# Patient Record
Sex: Female | Born: 1975 | Race: White | Hispanic: No | Marital: Married | Smoking: Former smoker
Health system: Southern US, Community
[De-identification: ages and names within clinical notes are randomized; demographics above are authoritative.]

## PROBLEM LIST (undated history)

## (undated) DIAGNOSIS — M797 Fibromyalgia: Secondary | ICD-10-CM

## (undated) DIAGNOSIS — R7303 Prediabetes: Secondary | ICD-10-CM

## (undated) DIAGNOSIS — E7800 Pure hypercholesterolemia, unspecified: Secondary | ICD-10-CM

## (undated) DIAGNOSIS — G8929 Other chronic pain: Secondary | ICD-10-CM

## (undated) DIAGNOSIS — R5382 Chronic fatigue, unspecified: Secondary | ICD-10-CM

## (undated) DIAGNOSIS — M419 Scoliosis, unspecified: Secondary | ICD-10-CM

## (undated) DIAGNOSIS — J45909 Unspecified asthma, uncomplicated: Secondary | ICD-10-CM

## (undated) DIAGNOSIS — G4700 Insomnia, unspecified: Secondary | ICD-10-CM

## (undated) DIAGNOSIS — G4730 Sleep apnea, unspecified: Secondary | ICD-10-CM

## (undated) HISTORY — PX: CHOLECYSTECTOMY: SHX55

## (undated) HISTORY — DX: Pure hypercholesterolemia, unspecified: E78.00

## (undated) HISTORY — PX: THYROIDECTOMY: SHX17

## (undated) HISTORY — DX: Insomnia, unspecified: G47.00

## (undated) HISTORY — DX: Prediabetes: R73.03

## (undated) HISTORY — PX: TONSILLECTOMY: SUR1361

## (undated) HISTORY — DX: Unspecified asthma, uncomplicated: J45.909

## (undated) HISTORY — DX: Sleep apnea, unspecified: G47.30

## (undated) HISTORY — PX: TUBAL LIGATION: SHX77

## (undated) HISTORY — DX: Fibromyalgia: M79.7

## (undated) HISTORY — PX: ABDOMINAL HYSTERECTOMY: SHX81

## (undated) HISTORY — PX: SPINAL FUSION: SHX223

## (undated) HISTORY — DX: Chronic fatigue, unspecified: R53.82

## (undated) HISTORY — DX: Scoliosis, unspecified: M41.9

## (undated) HISTORY — DX: Other chronic pain: G89.29

---

## 1998-05-09 ENCOUNTER — Observation Stay (HOSPITAL_COMMUNITY): Admission: AD | Admit: 1998-05-09 | Discharge: 1998-05-09 | Payer: Self-pay | Admitting: Obstetrics & Gynecology

## 1998-07-03 ENCOUNTER — Observation Stay (HOSPITAL_COMMUNITY): Admission: AD | Admit: 1998-07-03 | Discharge: 1998-07-04 | Payer: Self-pay | Admitting: Obstetrics and Gynecology

## 1998-08-27 ENCOUNTER — Inpatient Hospital Stay (HOSPITAL_COMMUNITY): Admission: AD | Admit: 1998-08-27 | Discharge: 1998-08-27 | Payer: Self-pay | Admitting: Obstetrics & Gynecology

## 1998-09-02 ENCOUNTER — Inpatient Hospital Stay (HOSPITAL_COMMUNITY): Admission: AD | Admit: 1998-09-02 | Discharge: 1998-09-02 | Payer: Self-pay | Admitting: Obstetrics and Gynecology

## 1998-09-02 ENCOUNTER — Encounter: Payer: Self-pay | Admitting: Obstetrics and Gynecology

## 1998-09-07 ENCOUNTER — Observation Stay (HOSPITAL_COMMUNITY): Admission: AD | Admit: 1998-09-07 | Discharge: 1998-09-08 | Payer: Self-pay | Admitting: Obstetrics and Gynecology

## 1998-09-09 ENCOUNTER — Inpatient Hospital Stay (HOSPITAL_COMMUNITY): Admission: AD | Admit: 1998-09-09 | Discharge: 1998-09-09 | Payer: Self-pay | Admitting: Obstetrics & Gynecology

## 1998-10-09 ENCOUNTER — Inpatient Hospital Stay (HOSPITAL_COMMUNITY): Admission: AD | Admit: 1998-10-09 | Discharge: 1998-10-09 | Payer: Self-pay | Admitting: Obstetrics and Gynecology

## 1998-10-10 ENCOUNTER — Inpatient Hospital Stay (HOSPITAL_COMMUNITY): Admission: AD | Admit: 1998-10-10 | Discharge: 1998-10-10 | Payer: Self-pay | Admitting: *Deleted

## 1998-10-27 ENCOUNTER — Inpatient Hospital Stay (HOSPITAL_COMMUNITY): Admission: AD | Admit: 1998-10-27 | Discharge: 1998-10-27 | Payer: Self-pay | Admitting: Obstetrics and Gynecology

## 1998-11-06 ENCOUNTER — Inpatient Hospital Stay (HOSPITAL_COMMUNITY): Admission: AD | Admit: 1998-11-06 | Discharge: 1998-11-08 | Payer: Self-pay | Admitting: Obstetrics and Gynecology

## 1998-12-05 ENCOUNTER — Other Ambulatory Visit: Admission: RE | Admit: 1998-12-05 | Discharge: 1998-12-05 | Payer: Self-pay | Admitting: Obstetrics and Gynecology

## 1999-12-05 ENCOUNTER — Other Ambulatory Visit: Admission: RE | Admit: 1999-12-05 | Discharge: 1999-12-05 | Payer: Self-pay | Admitting: Obstetrics and Gynecology

## 2000-03-09 ENCOUNTER — Emergency Department (HOSPITAL_COMMUNITY): Admission: EM | Admit: 2000-03-09 | Discharge: 2000-03-09 | Payer: Self-pay | Admitting: Emergency Medicine

## 2000-03-09 ENCOUNTER — Encounter: Payer: Self-pay | Admitting: Emergency Medicine

## 2000-03-26 ENCOUNTER — Inpatient Hospital Stay (HOSPITAL_COMMUNITY): Admission: AD | Admit: 2000-03-26 | Discharge: 2000-03-26 | Payer: Self-pay | Admitting: Obstetrics and Gynecology

## 2000-03-30 ENCOUNTER — Ambulatory Visit (HOSPITAL_COMMUNITY): Admission: RE | Admit: 2000-03-30 | Discharge: 2000-03-30 | Payer: Self-pay | Admitting: Obstetrics & Gynecology

## 2000-03-30 ENCOUNTER — Encounter: Payer: Self-pay | Admitting: Obstetrics & Gynecology

## 2000-04-06 ENCOUNTER — Inpatient Hospital Stay (HOSPITAL_COMMUNITY): Admission: AD | Admit: 2000-04-06 | Discharge: 2000-04-06 | Payer: Self-pay | Admitting: Obstetrics and Gynecology

## 2000-04-08 ENCOUNTER — Inpatient Hospital Stay (HOSPITAL_COMMUNITY): Admission: AD | Admit: 2000-04-08 | Discharge: 2000-04-08 | Payer: Self-pay | Admitting: Obstetrics and Gynecology

## 2000-04-11 ENCOUNTER — Inpatient Hospital Stay (HOSPITAL_COMMUNITY): Admission: AD | Admit: 2000-04-11 | Discharge: 2000-04-11 | Payer: Self-pay | Admitting: Obstetrics and Gynecology

## 2000-04-29 ENCOUNTER — Inpatient Hospital Stay (HOSPITAL_COMMUNITY): Admission: AD | Admit: 2000-04-29 | Discharge: 2000-05-01 | Payer: Self-pay | Admitting: Obstetrics & Gynecology

## 2000-04-30 ENCOUNTER — Encounter: Payer: Self-pay | Admitting: Obstetrics and Gynecology

## 2000-05-24 ENCOUNTER — Inpatient Hospital Stay (HOSPITAL_COMMUNITY): Admission: AD | Admit: 2000-05-24 | Discharge: 2000-05-24 | Payer: Self-pay | Admitting: Obstetrics and Gynecology

## 2000-06-02 ENCOUNTER — Inpatient Hospital Stay (HOSPITAL_COMMUNITY): Admission: AD | Admit: 2000-06-02 | Discharge: 2000-06-02 | Payer: Self-pay | Admitting: Obstetrics and Gynecology

## 2000-07-02 ENCOUNTER — Inpatient Hospital Stay (HOSPITAL_COMMUNITY): Admission: AD | Admit: 2000-07-02 | Discharge: 2000-07-04 | Payer: Self-pay | Admitting: Obstetrics & Gynecology

## 2000-07-14 ENCOUNTER — Inpatient Hospital Stay (HOSPITAL_COMMUNITY): Admission: AD | Admit: 2000-07-14 | Discharge: 2000-07-17 | Payer: Self-pay | Admitting: Obstetrics & Gynecology

## 2000-07-15 ENCOUNTER — Encounter: Payer: Self-pay | Admitting: Obstetrics and Gynecology

## 2000-07-21 ENCOUNTER — Inpatient Hospital Stay (HOSPITAL_COMMUNITY): Admission: AD | Admit: 2000-07-21 | Discharge: 2000-07-21 | Payer: Self-pay | Admitting: Obstetrics & Gynecology

## 2000-07-27 ENCOUNTER — Encounter (INDEPENDENT_AMBULATORY_CARE_PROVIDER_SITE_OTHER): Payer: Self-pay

## 2000-07-27 ENCOUNTER — Inpatient Hospital Stay (HOSPITAL_COMMUNITY): Admission: AD | Admit: 2000-07-27 | Discharge: 2000-07-30 | Payer: Self-pay | Admitting: Obstetrics and Gynecology

## 2000-08-24 ENCOUNTER — Other Ambulatory Visit: Admission: RE | Admit: 2000-08-24 | Discharge: 2000-08-24 | Payer: Self-pay | Admitting: Obstetrics and Gynecology

## 2000-09-24 ENCOUNTER — Observation Stay (HOSPITAL_COMMUNITY): Admission: RE | Admit: 2000-09-24 | Discharge: 2000-09-25 | Payer: Self-pay

## 2001-09-15 ENCOUNTER — Other Ambulatory Visit: Admission: RE | Admit: 2001-09-15 | Discharge: 2001-09-15 | Payer: Self-pay | Admitting: *Deleted

## 2001-09-15 ENCOUNTER — Other Ambulatory Visit: Admission: RE | Admit: 2001-09-15 | Discharge: 2001-09-15 | Payer: Self-pay | Admitting: Obstetrics and Gynecology

## 2002-11-02 ENCOUNTER — Other Ambulatory Visit: Admission: RE | Admit: 2002-11-02 | Discharge: 2002-11-02 | Payer: Self-pay | Admitting: Obstetrics and Gynecology

## 2003-12-19 ENCOUNTER — Other Ambulatory Visit: Admission: RE | Admit: 2003-12-19 | Discharge: 2003-12-19 | Payer: Self-pay | Admitting: Obstetrics and Gynecology

## 2004-01-17 ENCOUNTER — Encounter: Admission: RE | Admit: 2004-01-17 | Discharge: 2004-01-17 | Payer: Self-pay | Admitting: Family Medicine

## 2004-12-17 ENCOUNTER — Ambulatory Visit (HOSPITAL_COMMUNITY): Admission: RE | Admit: 2004-12-17 | Discharge: 2004-12-17 | Payer: Self-pay | Admitting: Endocrinology

## 2005-01-07 ENCOUNTER — Encounter (INDEPENDENT_AMBULATORY_CARE_PROVIDER_SITE_OTHER): Payer: Self-pay | Admitting: Specialist

## 2005-01-07 ENCOUNTER — Other Ambulatory Visit: Admission: RE | Admit: 2005-01-07 | Discharge: 2005-01-07 | Payer: Self-pay | Admitting: Interventional Radiology

## 2005-01-07 ENCOUNTER — Encounter: Admission: RE | Admit: 2005-01-07 | Discharge: 2005-01-07 | Payer: Self-pay | Admitting: Endocrinology

## 2005-04-01 ENCOUNTER — Other Ambulatory Visit: Admission: RE | Admit: 2005-04-01 | Discharge: 2005-04-01 | Payer: Self-pay | Admitting: Obstetrics and Gynecology

## 2005-10-26 ENCOUNTER — Encounter: Admission: RE | Admit: 2005-10-26 | Discharge: 2005-10-26 | Payer: Self-pay | Admitting: Family Medicine

## 2005-11-25 ENCOUNTER — Encounter: Admission: RE | Admit: 2005-11-25 | Discharge: 2005-11-25 | Payer: Self-pay | Admitting: Orthopaedic Surgery

## 2005-12-10 ENCOUNTER — Encounter: Admission: RE | Admit: 2005-12-10 | Discharge: 2005-12-10 | Payer: Self-pay | Admitting: Orthopaedic Surgery

## 2005-12-25 ENCOUNTER — Encounter: Admission: RE | Admit: 2005-12-25 | Discharge: 2005-12-25 | Payer: Self-pay | Admitting: Orthopaedic Surgery

## 2006-01-08 ENCOUNTER — Encounter: Admission: RE | Admit: 2006-01-08 | Discharge: 2006-01-08 | Payer: Self-pay | Admitting: Orthopaedic Surgery

## 2006-02-15 ENCOUNTER — Ambulatory Visit (HOSPITAL_COMMUNITY): Admission: RE | Admit: 2006-02-15 | Discharge: 2006-02-15 | Payer: Self-pay | Admitting: Endocrinology

## 2006-05-17 ENCOUNTER — Encounter: Admission: RE | Admit: 2006-05-17 | Discharge: 2006-05-17 | Payer: Self-pay | Admitting: Endocrinology

## 2006-06-28 ENCOUNTER — Ambulatory Visit (HOSPITAL_COMMUNITY): Admission: RE | Admit: 2006-06-28 | Discharge: 2006-06-29 | Payer: Self-pay

## 2006-06-28 ENCOUNTER — Encounter (INDEPENDENT_AMBULATORY_CARE_PROVIDER_SITE_OTHER): Payer: Self-pay | Admitting: Specialist

## 2006-08-24 ENCOUNTER — Encounter
Admission: RE | Admit: 2006-08-24 | Discharge: 2006-11-22 | Payer: Self-pay | Admitting: Physical Medicine and Rehabilitation

## 2007-07-07 ENCOUNTER — Encounter (INDEPENDENT_AMBULATORY_CARE_PROVIDER_SITE_OTHER): Payer: Self-pay | Admitting: Obstetrics and Gynecology

## 2007-07-07 ENCOUNTER — Ambulatory Visit (HOSPITAL_COMMUNITY): Admission: RE | Admit: 2007-07-07 | Discharge: 2007-07-08 | Payer: Self-pay | Admitting: Obstetrics and Gynecology

## 2008-02-13 ENCOUNTER — Encounter: Payer: Self-pay | Admitting: Rheumatology

## 2008-03-07 ENCOUNTER — Encounter: Payer: Self-pay | Admitting: Rheumatology

## 2008-05-16 ENCOUNTER — Encounter: Admission: RE | Admit: 2008-05-16 | Discharge: 2008-05-16 | Payer: Self-pay | Admitting: Family Medicine

## 2010-09-28 ENCOUNTER — Encounter: Payer: Self-pay | Admitting: Endocrinology

## 2011-01-20 NOTE — H&P (Signed)
NAMENIMSI, MALES                  ACCOUNT NO.:  [id number]   MEDICAL RECORD NO.:  [id number]          PATIENT TYPE:  AMB   LOCATION:  SDC                           FACILITY:  [facility]   PHYSICIAN:  Michelle L. Grewal, M.D.DATE OF BIRTH:  [birth year]   DATE OF ADMISSION:  07/07/2007  DATE OF DISCHARGE:                              HISTORY & PHYSICAL   HISTORY OF PRESENT ILLNESS:  A 35 year old G4, P4 who presents today  with a history of PCOS and chronic pelvic pain as well as recurrent  ovarian cysts.  She is a G4, P4 and ever since the delivery of her last  child, she has had a problem with weight gain.  She had her thyroid  removed in October 2007, and has been on Levoxyl since.  The patient has  also had recurrent office visits for lower abdominal pain and has been  diagnosed with ovarian cyst from time to time.  We have tried her on  multiple birth control pills that she has not tolerated and her pain has  not improved.  The pain is usually in the bilateral lower quadrants and  sometimes is constant and dull and sometimes lasts for weeks at a time.  Her last visit in the office was on May 26, 2007, for this pain.  At that time, ultrasound showed a 1.8-cm simple appearing cyst.  She has  no GU problems and no GI problems.  She had been on Loestrin as well as  previous other birth control pills as well as metformin.  She presents  today for definitive treatment for chronic pelvic pain with LAVH and  BSO.  The patient had tubal ligation in the past.   MEDICATIONS:  Lyrica, Loestrin, phentermine, Norco, Ambien, Advair,  Levoxyl and previously was on Cymbalta.   ALLERGIES:  ERYTHROMYCIN AND ZYRTEC D.   PAST SURGICAL HISTORY:  1. Lumbar fusion on Jan 14, 2006.  2. Fibroid removal on October 2007.  3. Tubal ligation.   PAST GYNECOLOGICAL HISTORY:  The patient is a G4, P4.   REVIEW OF SYSTEMS:  As above.   PHYSICAL EXAMINATION:  VITAL SIGNS:  The patient is afebrile with  stable  vital signs.  Height is 5 feet 1 inch, weight is 222.4 pounds.  GENERAL:  She is alert and oriented in no apparent distress.  LUNGS:  Clear to auscultation bilaterally.  CARDIAC:  Regular rate and rhythm.  BREASTS:  Within normal limits.  ABDOMEN:  Slightly obese and nontender.  She does have some tenderness  except for some tenderness in the bilateral lower quadrant.  PELVIC:  External genitalia within normal limits.  Vagina appears  normal.  Cervix with no lesions.  She has good descensus.  The uterus is  normal size, nontender.  Bilateral adnexal tenderness with no fullness  noted.   IMPRESSION:  1. Chronic pelvic pain.  2. Polycystic ovary syndrome.  3. History of ovarian cyst.   PLAN:  Will proceed with LAVH and BSO.  The risks of surgery were  discussed with the patient prior to surgery which include the  risk of  anesthesia, risk of infection, risk of pulmonary embolism and deep  venous thromboembolism, risk of injury to ureter, bowel or bladder and  risk of bleeding and risk associated with transfusion.  The patient  agrees to proceed with surgery.      Michelle L. Vincente Poli, M.D.  Electronically Signed     MLG/MEDQ  D:  07/06/2007  T:  07/07/2007  Job:  [id number]

## 2011-01-20 NOTE — Op Note (Signed)
[name], [name]                  ACCOUNT NO.:  [id number]   MEDICAL RECORD NO.:  [id number]          PATIENT TYPE:  AMB   LOCATION:  SDC                           FACILITY:  [facility]   PHYSICIAN:  Michelle L. Grewal, M.D.DATE OF BIRTH:  April 06, 1976   DATE OF PROCEDURE:  DATE OF DISCHARGE:  07/07/2007                               OPERATIVE REPORT   PREOPERATIVE DIAGNOSES:  1. Pelvic pain.  2. Ovarian cyst.  3. Polycystic ovary syndrome.   POSTOPERATIVE DIAGNOSES:  1. Pelvic pain.  2. Ovarian cyst.  3. Polycystic ovary syndrome.   PROCEDURE:  Laparoscopically-assisted vaginal hysterectomy and bilateral  salpingo-oophorectomy.   SURGEON:  Michelle L. Vincente Poli, MD   ASSISTANT:  Zelphia Cairo, MD   ANESTHESIA:  General.   SPECIMENS:  Uterus, tubes and ovaries sent to routine pathology.   ESTIMATED BLOOD LOSS:  200 mL.   DRAINS:  Foley.   PROCEDURE:  The patient was taken to the operating room after informed  consent was obtained.  She was then prepped and draped in the usual  sterile fashion after she was intubated without difficulty.  Antibiotics  were given prophylactically.  She also had pneumatic stockings on.  A  Foley catheter was inserted after she was draped.  Attention was turned  to the abdomen, where an infraumbilical incision was made with a  scalpel.  The Veress needle was inserted and pneumoperitoneum was  performed without difficulty.  The Veress needle was removed and an 11-  mm trocar was inserted.  The patient was placed in Trendelenburg  position and a 5-mm trocar was placed suprapubically under direct  visualization.  Exam of the abdomen and pelvis revealed that she was  status post C-section and status post tubal ligation.  The uterus was  normal size.  There were no adhesions and no evidence of endometriosis.  Ovaries appeared consistent with polycystic ovaries.  We then identified  the ureters, which were well below our IP ligaments, and there were  easily seen bilaterally.  We then started on the right side by lifting  up the ovary and placing in the Gyrus instrument across the IP ligament  just beneath the ovary, cauterizing and transecting and carrying it all  the way to the round ligament.  This was done on the right and then on  the left side in a similar fashion.  There was really no bleeding with  this portion of the procedure.  After we did that, we then converted  vaginally by removing the laparoscope, leaving the trocars and releasing  the pneumoperitoneum.  A weighted speculum was placed in the vagina.  A  circumferential incision was made around the cervix.  The posterior cul-  de-sac was entered sharply and the anterior cul-de-sac was entered  sharply as well.  We then used curved Heaney clamps and clamped just  beside the cervix, the uterosacral-cardinal ligaments.  Each pedicle was  cut and suture ligated using 0 Vicryl suture.  We worked our way all the  way up the broad ligament.  Each pedicle was suture ligated using 0  Vicryl suture.  The uterus was then removed along with the tubes and  ovaries.  It was sent to pathology.  The posterior cuff was closed from  3 to 9 o'clock in a running stitch using 0 Vicryl suture and then the  cuff was closed anterior to posterior using 0 Vicryl suture as well.  We  then went back up to the abdomen, replaced the pneumoperitoneum, placed  the patient in Trendelenburg position, irrigated the pelvis.  There was  no bleeding at all.  Hemostasis was excellent.  We then removed the  pneumoperitoneum, removed the trocars and closed each incision with a  figure-of-eight using 0 Vicryl suture and 3-0 Vicryl suture and closed  each incision with Dermabond skin adhesive.  All sponge, lap and  instrument counts were correct x2.  The patient went to recovery room in  stable condition.      Michelle L. Vincente Poli, M.D.  Electronically Signed     MLG/MEDQ  D:  07/07/2007  T:  07/07/2007   Job:  [id number]

## 2011-01-23 NOTE — Op Note (Signed)
[facility]  PatientCHANAY, NUGENT                           MRN: [id number] Proc. Date: 07/27/00 Adm. Date:  [id number] Attending:  Marcelle Overlie                           Operative Report  PREOPERATIVE DIAGNOSES:       1. Intrauterine pregnancy at 35-2/7 weeks.                               2. Twin pregnancy with twin B breech                                  presentation.                               3. Labor.                               4. Desires permanent sterilization.  POSTOPERATIVE DIAGNOSIS:      1. Intrauterine pregnancy at 35-2/7 weeks.                               2. Twin pregnancy with twin B breech                                  presentation.                               3. Labor.                               4. Desires permanent sterilization.  PROCEDURE:                    1. Primary low transverse cesarean section.                               2. Bilateral tubal ligation.  SURGEON:                      Marcelle Overlie, M.D.  ASSISTANT:                    Jamey Reas, M.D.  ANESTHESIA:                   General.  FINDINGS:                     Twin A 5 pounds 12 ounces, Apgars 8 at one minute and 9 at five minutes.  Twin B, weighing 6 pounds and Apgars 8 and 9. Both are female.  Normal tubes.  PATHOLOGY:                    Bilateral fallopian tube segments.  DESCRIPTION OF PROCEDURE:  The patient was taken to the operating room.  A spinal was attempted but failed secondary to the patients prior history of back surgery, and the patient was then intubated.  Prior to intubation, the abdomen was prepped and draped, and a Foley was already placed.  After intubation, the scalpel was used to make a low transverse incision which was carried down to the fascia.  The fascia was scored in the midline and extended laterally. The fascia was then dissected, and a Pfannenstiel incision was then created, and the  peritoneum was entered in the midline.  The peritoneal incision was stretched, and the lateral uterine segment was identified.  The bladder flap was then created sharply and then digitally, and the bladder blade was then readjusted.  A low transverse incision was made in the uterus and upon entering the amniotic cavity, the fluid was noted to be clear.  Baby A was delivered in cephalic presentation and was a female weighing 5 pounds 12 ounces with Apgars 8 at one minute and 9 at five minutes.  The cord was clamped and cut, and the baby was handed to the waiting pediatricians.  Baby B was noted to be in transverse position but after delivery of A, the head floated to the incision nicely, and an amniotomy was performed with clear fluid in the back of baby B, and the baby was delivered quite easily and was a female weighing 6 pounds with Apgars 8 at one minute and 9 at five minutes. The cord was clamped and cut, and the baby was handed to the waiting pediatricians.  The placentas were manually removed and noted to be intact. The uterus was then cleared of all clots and debris.  The uterine incision was closed in a single layer using 0 chromic and continuous running lock stitch, and then we proceeded with the tubal, identifying the tube initially on the left side and following it to its fimbriated end, grasping the mid portion with a Babcock clamp and tying off with a 3 cm knuckle using plain gut suture x 2 and then excising that knuckle.  We then performed a modified Pomeroy tubal ligation on the right side in a similar fashion after the fimbriated end was easily visualized.  The ovaries were noted to be normal in appearance. The uterus was returned to the abdomen.  The peritoneum was closed in a single layer using 0 Vicryl in continuous running stitch and the rectus muscles were reapproximated using the same 0 Vicryl.  The fascia was closed using 0 Vicryl in continuous running stitch starting  at each corner and meeting in the midline.  After irrigation of the subcutaneous layer, the skin was closed with staples.  All sponge, lap, and instrument counts were correct x 2.  The patient tolerated the procedure well and went to the recovery room in stable condition. DD:  07/27/00 TD:  07/28/00 Job: [id number]

## 2011-01-23 NOTE — Discharge Summary (Signed)
[facility]  PatientKASHISH, [name]                           MRN: [id number] Adm. Date:  [id number] Disc. Date: 07/04/00 Attending:  Marcelle Overlie Dictator:   Leilani Able, P.A.                           Discharge Summary  FINAL DIAGNOSES:              1. Twin gestation at 31-5/[redacted] weeks gestation.                               2. Preterm labor.  HISTORY OF PRESENT ILLNESS:   This 35 year old G4, P2-0-1-2, presents at 31-5/[redacted] weeks gestation with contractions.  HOSPITAL COURSE:              The patient was admitted at this time.  Started on magnesium sulfate.  Group B strep cultures were obtained.  She was also started on Unasyn 3 g IV every six hours.  Was also started on betamethasone protocol.  The patients contractions subsided with magnesium sulfate.  On July 04, 2000, the patients magnesium sulfate was stopped.  She was started on oral terbutaline.  She was stable on her oral terbutaline on July 04, 2000, and felt ready for discharge.  She was sent home on a regular diet, was told to continue bed rest.  Was given terbutaline 2.5 mg 1 every three to four hours, and then to call the office if contractions resumed. DD:  07/28/00 TD:  07/29/00 Job: [id number]

## 2011-01-23 NOTE — Discharge Summary (Signed)
[facility]  PatientSRISHTI, STRNAD                           MRN: [id number] Adm. Date:  [id number] Disc. Date: [id number] Attending:  Marcelle Overlie Dictator:   Leilani Able, P.A.                           Discharge Summary  FINAL DIAGNOSES:              1. Intrauterine pregnancy at 35-2/[redacted] weeks                                  gestation, twin pregnancy with twin B at                                  breech presentation, in labor.                               2. Desires permanent sterilization.  PROCEDURES:                   1. Low transverse cesarean section.                               2. Bilateral tubal ligation.  SURGEON:                      Marcelle Overlie, M.D.  ASSISTANT:                    Jamey Reas, M.D.  HISTORY OF PRESENT ILLNESS:   This 35 year old, G4, P2-0-1-2, presents at [redacted] weeks gestation, in active labor.  The patients pregnancy has been complicated by twin pregnancy and multiple admissions for preterm labor.  The patient also desires permanent sterilization at this time.  HOSPITAL COURSE:              She was taken to the operating room by Marcelle Overlie, M.D., where a primary low transverse cesarean section was performed with the delivery of twin A weighing 5 pounds 12 ounces with Apgars of 8 and 9, which was a female.  Twin B was also a female, weighing 6 pounds with Apgars of 8 and 9.  The delivery went without complications.  At this point, bilateral tubal ligation was performed.  The delivery went without complication. The patients postoperative course was benign without significant fevers.  DISPOSITION:                  She was felt ready for discharge on postoperative day #3.  DIET:                         She was sent home on a regular diet.  ACTIVITY:                     Told to decrease activities.  DISCHARGE MEDICATIONS:        Told to continue prenatal vitamins.  She was given Tylox, #30,  one to two every four hours as needed for pain.  FOLLOW-UP:  Told to follow up in the office in four weeks.  LABORATORY DATA:              On discharge, the patient had a hemoglobin of 10.1 and a white blood cell count of 14.9. DD:  09/16/00 TD:  09/16/00 Job: [id number]

## 2011-01-23 NOTE — Op Note (Signed)
[facility]  Patient:    [name], [name]                    MRN: [id number] Proc. Date: 09/24/00 Adm. Date:  [id number] Disc. Date: [id number] Attending:  Meredith Leeds                           Operative Report  PREOPERATIVE DIAGNOSIS:  Symptomatic gallstones.  POSTOPERATIVE DIAGNOSIS:  Symptomatic gallstones.  OPERATION PERFORMED:  Laparoscopic cholecystectomy with operative cholangiogram.  SURGEON:  Dr. Orson Slick.  ASSISTANT:  Dr. Samuella Cota.  ANESTHESIA:  General.  DESCRIPTION OF PROCEDURE:  After the patient was adequately monitored and anesthetized and after routine preparation and draping of the abdomen, I made a short transverse infraumbilical incision, dissected down to the fascia, opened it longitudinally and entered the peritoneum bluntly. After placing a #0 Vicryl pursestring suture and securing a Hasson cannula, I inflated the abdomen with CO2 and performed laparoscopy. There was some adhesions of the omentum to the lower midline which I subsequently took down with hot scissors but otherwise no abnormalities were noted. After placement of three additional ports and positioning the patient head up foot down and tilted to the left, I retracted the fundus of the gallbladder upward and retracted the infundibulum of the gallbladder laterally and dissected out the cystic duct and the cystic artery. The cystic artery lay in front of the cystic duct and so I clipped and divided it prior to controlling the cystic duct. After assuring that I was dealing with the cystic duct, I put a clip toward the gallbladder and then made a small hole in it and put in a Reddick cholangiogram catheter and inflated the balloon partially and performed a fluoroscopic cholangiogram showing normal size ducts with no evidence of any filling defects or obstruction. I then removed the cholangiogram catheter and clipped the cystic duct with three clips distal to the  hole and divided it. I dissected the gallbladder from the liver with the spatula and hook cautery devices and got hemostasis with the cautery. Hemostasis was excellent and there was no evidence of leakage of bile. After irrigation and removal of the irrigant, I released CO2, tied the pursestring suture in the umbilical incision after removing the gallbladder through the umbilical incision and then removed the other ports. I closed the skin of all the incisions with intracuticular 4-0 Vicryl and Steri-Strips. All incisions were anesthetized with long acting local anesthetic. The patient tolerated the operation well. DD:  09/24/00 TD:  09/25/00 Job: [id number]

## 2011-01-23 NOTE — Discharge Summary (Signed)
[facility]  PatientMABEL, [name]                           MRN: [id number] Adm. Date:  [id number] Disc. Date: [id number] Attending:  Marcelle Overlie Dictator:   Leilani Able, P.A.                           Discharge Summary  FINAL DIAGNOSES:              1. Twin gestation at 33-3/[redacted] weeks gestation.                               2. Threatened preterm labor.                               3. Upper respiratory tract infection.  HISTORY OF PRESENT ILLNESS:   This 35 year old, G4, P2-0-1-2, presents with on and off contractions since July 13, 2000.  The patient has a history of preterm labor during this pregnancy.  She has been treated with terbutaline, Procardia, and magnesium sulfate.  Her most recent admission was on July 02, 2000.  At that point, the patient had been treated with magnesium sulfate for 48 hours.  She was given two doses of betamethasone and was given prophylactic Unasyn.  She was discharged home on terbutaline.  Now the contractions are starting up again.  Since arriving at the hospital, the patient received subcu terbutaline with decreased contractions for about 15 minutes, but now they are increased again.  The patient is admitted at this time for magnesium sulfate tocolysis and another two doses of betamethasone.  HOSPITAL COURSE:              The patients contractions ceased on the magnesium sulfate.  By hospital day #2, the patient was weaned off of magnesium sulfate and started back on her oral terbutaline.  Ultrasounds were performed on both babies, which showed good growth and normal fluid.  They did show twin B to be in a breech presentation.  By hospital day #3, the patient was having rare contractions and she was stable without any cervical change.  ACTIVITIES:                   She was sent home on bed rest.  DISCHARGE MEDICATIONS:        To continue her vitamins with iron.  To continue her terbutaline 5 mg  one every four hours.  Continue her Keflex as previously prescribed for upper respiratory tract infection.  FOLLOW-UP:                    To follow up on Monday in the office.  SPECIAL INSTRUCTIONS:         The patient was asked to call for any increasing contractions, bleeding, or pain. DD:  09/16/00 TD:  09/16/00 Job: [id number]

## 2011-01-23 NOTE — Op Note (Signed)
Marissa, Mejia                  ACCOUNT NO.:  [id number]   MEDICAL RECORD NO.:  [id number]          PATIENT TYPE:  OIB   LOCATION:  1613                         FACILITY:  [facility]   PHYSICIAN:  Lebron Conners, M.D.   DATE OF BIRTH:  08/22/1976   DATE OF PROCEDURE:  06/28/2006  DATE OF DISCHARGE:                                 OPERATIVE REPORT   PRE AND POSTOPERATIVE DIAGNOSIS:  Bilateral thyroid nodules.   OPERATION:  Total thyroidectomy.   SURGEON:  Lebron Conners, M.D.   ASSISTANT:  Anselm Pancoast. Zachery Dakins, M.D.   ANESTHESIA:  General.   SPECIMEN:  The thyroid gland.   BLOOD LOSS:  Minimal.   COMPLICATIONS:  None.   PROCEDURE:  After the patient was monitored and asleep and had routine  preparation and draping of the neck and had the head extended so that I had  good view of the anterior neck, I prepped and draped the neck as sterile  field.  I made about an 8 cm collar incision through a skin wrinkle line  about 3 cm above sternal notch.  I dissected down through the fat and  platysma and then dissected the planes beneath the platysma cephalad to the  thyroid cartilage and caudad to the sternal notch.  After placement of a  Mahorner self-retaining retractor I incised the midline fascia through the  strap muscles and identified the isthmus of thyroid gland.  Working first on  the right lobe.  I dissected anterior to the gland.  Retracting the strap  muscles anteriorly and cutting a little bit of the strap muscles on the  superior aspect to get good view of the superior pole.  I passed a right-  angle clamp around the superior pole vessels and ligated them with 2-0 silk  ligature proximally and distally and also clipped them proximally with  medium clip and divided them.  I then dissected more caudad on the gland,  noting a large middle vein and clipped and divided it.  I also clipped and  divided the inferior thyroid artery and veins as they entered the thyroid  gland  staying very close to the gland.  I then pulled the gland forward and  used blunt dissection.  I lifted it up out of the tracheoesophageal groove.  I identified the recurrent laryngeal nerve as it rose up into the larynx  through the tracheoesophageal groove.  I made sure that I did not cauterize  it or harm it.  I then brought the gland forward some more leaving a very  small portion of the thyroid gland attached the area of the tubercle of  Zuckerkandl and an area near the recurrent nerve and then dissecting between  the thyroid gland and the trachea past the midline.  I then packed the  dissected area with Surgicel and gauze and turned attention to the left  side.  I followed a similar procedure.  Both sides of the gland were noted  to have multiple nodules.  I identified the recurrent nerve on the left side  similar to way I did  on the right and spared it from harm.  I also saw at  least one parathyroid gland on each side and was sure that I did not harm.  After completely removing the thyroid gland, I placed a suture on the left  superior pole for orientation for the pathologist.  I irrigated the neck and  removed the irrigant and packed Surgicel into each side.  Hemostasis was  excellent.  Sponge, needle and instrument counts were correct.  I closed the  midline fascia and the platysma with running 3-0 Vicryl suture and I closed  the skin with interrupted 4-0 Vicryls buried and reinforced with Steri-  Strips.  The patient tolerated the operation well.      Lebron Conners, M.D.  Electronically Signed     WB/MEDQ  D:  06/28/2006  T:  06/28/2006  Job:  [id number]   cc:   Dorisann Frames, M.D.  Fax: [phone]   Carola J. Gerri Spore, M.D.  Fax: [phone]

## 2011-05-01 ENCOUNTER — Encounter: Payer: Self-pay | Admitting: Pulmonary Disease

## 2011-05-04 ENCOUNTER — Institutional Professional Consult (permissible substitution): Payer: Self-pay | Admitting: Pulmonary Disease

## 2011-06-03 ENCOUNTER — Encounter: Payer: Self-pay | Admitting: Pulmonary Disease

## 2011-06-08 ENCOUNTER — Institutional Professional Consult (permissible substitution): Payer: Self-pay | Admitting: Pulmonary Disease

## 2011-06-17 LAB — CBC
HCT: 33.9 — ABNORMAL LOW
HCT: 42.3
MCHC: 34.8
Platelets: 289
RDW: 12.8
WBC: 8.2

## 2011-06-17 LAB — BASIC METABOLIC PANEL
BUN: 12
Calcium: 9.3
Creatinine, Ser: 0.67
GFR calc non Af Amer: >60
Glucose, Bld: 87

## 2011-06-17 LAB — PREGNANCY, URINE: Preg Test, Ur: NEGATIVE

## 2012-06-06 ENCOUNTER — Other Ambulatory Visit: Payer: Self-pay | Admitting: Obstetrics and Gynecology

## 2013-05-02 ENCOUNTER — Other Ambulatory Visit: Payer: Self-pay | Admitting: Nurse Practitioner

## 2013-05-02 ENCOUNTER — Ambulatory Visit
Admission: RE | Admit: 2013-05-02 | Discharge: 2013-05-02 | Disposition: A | Discharge status: Home / Self Care | Payer: No Typology Code available for payment source | Source: Ambulatory Visit | Attending: Nurse Practitioner | Admitting: Nurse Practitioner

## 2013-05-02 DIAGNOSIS — R109 Unspecified abdominal pain: Secondary | ICD-10-CM

## 2013-05-02 DIAGNOSIS — R6881 Early satiety: Secondary | ICD-10-CM

## 2014-02-26 ENCOUNTER — Ambulatory Visit (INDEPENDENT_AMBULATORY_CARE_PROVIDER_SITE_OTHER): Payer: Self-pay | Admitting: Pulmonary Disease

## 2014-02-26 ENCOUNTER — Encounter: Payer: Self-pay | Admitting: Pulmonary Disease

## 2014-02-26 ENCOUNTER — Encounter (INDEPENDENT_AMBULATORY_CARE_PROVIDER_SITE_OTHER): Payer: Self-pay

## 2014-02-26 VITALS — BP 120/72 | HR 85 | Temp 98.2°F | Ht 61.0 in | Wt 265.2 lb

## 2014-02-26 DIAGNOSIS — R0683 Snoring: Secondary | ICD-10-CM

## 2014-02-26 DIAGNOSIS — G4733 Obstructive sleep apnea (adult) (pediatric): Secondary | ICD-10-CM

## 2014-02-26 DIAGNOSIS — R0989 Other specified symptoms and signs involving the circulatory and respiratory systems: Secondary | ICD-10-CM

## 2014-02-26 DIAGNOSIS — R0609 Other forms of dyspnea: Secondary | ICD-10-CM

## 2014-02-26 NOTE — Assessment & Plan Note (Signed)
Given excessive daytime somnolence, narrow pharyngeal exam, witnessed apneas & loud snoring, obstructive sleep apnea is very likely & an overnight polysomnogram will be scheduled as a Home study. The pathophysiology of obstructive sleep apnea , it's cardiovascular consequences & modes of treatment including CPAP were discused with the patient in detail & they evidenced understanding. Given her limited financial resources, we will proceed with a home study. Following this we can either schedule auto CPAP or formal CPAP titration in the lab

## 2014-02-26 NOTE — Progress Notes (Signed)
Subjective:    Patient ID: [name], female    DOB: 03-30-1976, 38 y.o.   MRN: [id number]  HPI  38 year old obese woman with fibromyalgia presents for evaluation of sleep apnea. She reports loud snoring and witnessed apneas by her husband and children. Epworth sleepiness score is 12/24 She reports waking up gasping for air and non-refreshing sleep for at least 5 years. She reports daily naps for about 30 minutes which is refreshing. Bedtime is anywhere from midnight to 2 AM,  She takes 5 mg of Ambien at bedtime, sleep latency is 30-45 minutes, she sleeps on her side with 2 pillows and another pillow for her neck and back. She reports a couple of awakenings including bathroom visits and is out of bed as late as 1 PM feeling groggy with occasional headaches, and dry mouth and sore throat almost daily. She has gained 100 pounds over the last 10 years. She takes metformin for polycystic ovarian syndrome and Topamax for fibromyalgia. She quit smoking in 2006, reports tonsillectomy in 93 and thyroidectomy in 2007. She reports occasional heartburn and indigestion. Weight loss encouraged, compliance with goal of at least 4-6 hrs every night is the expectation. Advised against medications with sedative side effects Cautioned against driving when sleepy - understanding that sleepiness will vary on a day to day basis   Past Medical History  Diagnosis Date  . Scoliosis   . Fibromyalgia   . Chronic fatigue   . Insomnia   . High cholesterol   . Asthma     Past Surgical History  Procedure Laterality Date  . Thyroidectomy    . Cholecystectomy    . Cesarean section    . Spinal fusion    . Tubal ligation    . Abdominal hysterectomy    . Tonsillectomy      Allergies  Allergen Reactions  . Erythromycin   . Zyrtec [Cetirizine Hcl]     History   Social History  . Marital Status: Married    Spouse Name: N/A    Number of Children: 4  . Years of Education: N/A   Occupational  History  . stay at home mother    Social History Main Topics  . Smoking status: Former Smoker -- 0.25 packs/day for 12 years    Types: Cigarettes    Quit date: 09/07/2004  . Smokeless tobacco: Not on file  . Alcohol Use: Yes     Comment: 4 times a year  . Drug Use: No  . Sexual Activity: Not on file   Other Topics Concern  . Not on file   Social History Narrative  . No narrative on file    Family History  Problem Relation Age of Onset  . Cancer Maternal Uncle   . Heart disease Father   . Heart disease Paternal Grandfather   . Breast cancer Maternal Grandmother   . Rheum arthritis Mother   . Rheum arthritis Maternal Grandfather   . Rheum arthritis Maternal Aunt       Review of Systems  Constitutional: Negative for fever and unexpected weight change.  HENT: Negative for congestion, dental problem, ear pain, nosebleeds, postnasal drip, rhinorrhea, sinus pressure, sneezing, sore throat and trouble swallowing.   Eyes: Negative for redness and itching.  Respiratory: Negative for cough, chest tightness, shortness of breath and wheezing.   Cardiovascular: Negative for palpitations and leg swelling.  Gastrointestinal: Positive for abdominal pain. Negative for nausea and vomiting.  Genitourinary: Negative for dysuria.  Musculoskeletal: Positive for  arthralgias. Negative for joint swelling.  Skin: Negative for rash.  Neurological: Positive for headaches.  Hematological: Does not bruise/bleed easily.  Psychiatric/Behavioral: Positive for dysphoric mood. The patient is nervous/anxious.        Objective:   Physical Exam  Gen. Pleasant, obese, in no distress, normal affect ENT - no lesions, no post nasal drip, class 2-3 airway Neck: No JVD, no thyromegaly, no carotid bruits Lungs: no use of accessory muscles, no dullness to percussion, decreased without rales or rhonchi  Cardiovascular: Rhythm regular, heart sounds  normal, no murmurs or gallops, no peripheral edema Abdomen:  soft and non-tender, no hepatosplenomegaly, BS normal. Musculoskeletal: No deformities, no cyanosis or clubbing Neuro:  alert, non focal, no tremors       Assessment & Plan:

## 2014-02-26 NOTE — Patient Instructions (Signed)
Home sleep study You will likely need a CPAP machine

## 2014-03-26 DIAGNOSIS — G4710 Hypersomnia, unspecified: Secondary | ICD-10-CM

## 2014-03-26 DIAGNOSIS — G4730 Sleep apnea, unspecified: Secondary | ICD-10-CM

## 2014-03-28 ENCOUNTER — Telehealth: Payer: Self-pay | Admitting: Pulmonary Disease

## 2014-03-28 DIAGNOSIS — G4733 Obstructive sleep apnea (adult) (pediatric): Secondary | ICD-10-CM

## 2014-03-28 NOTE — Telephone Encounter (Signed)
Home sleep study showed severe OSA, AHI 83 per hour. Suggest CPAP titration study

## 2014-03-29 NOTE — Telephone Encounter (Signed)
I spoke with patient about results and she verbalized understanding and had no questions 

## 2014-04-06 DIAGNOSIS — G4733 Obstructive sleep apnea (adult) (pediatric): Secondary | ICD-10-CM

## 2014-04-16 ENCOUNTER — Encounter: Payer: Self-pay | Admitting: Pulmonary Disease

## 2014-04-19 ENCOUNTER — Ambulatory Visit (HOSPITAL_BASED_OUTPATIENT_CLINIC_OR_DEPARTMENT_OTHER): Payer: Self-pay | Attending: Pulmonary Disease

## 2014-04-19 VITALS — Ht 61.0 in | Wt 258.0 lb

## 2014-04-19 DIAGNOSIS — G4761 Periodic limb movement disorder: Secondary | ICD-10-CM | POA: Insufficient documentation

## 2014-04-19 DIAGNOSIS — G4733 Obstructive sleep apnea (adult) (pediatric): Secondary | ICD-10-CM | POA: Insufficient documentation

## 2014-04-20 ENCOUNTER — Telehealth: Payer: Self-pay | Admitting: Pulmonary Disease

## 2014-04-20 DIAGNOSIS — G4710 Hypersomnia, unspecified: Secondary | ICD-10-CM

## 2014-04-20 DIAGNOSIS — G4730 Sleep apnea, unspecified: Secondary | ICD-10-CM

## 2014-04-20 DIAGNOSIS — G4733 Obstructive sleep apnea (adult) (pediatric): Secondary | ICD-10-CM

## 2014-04-20 NOTE — Telephone Encounter (Signed)
[id number] x1 

## 2014-04-20 NOTE — Telephone Encounter (Signed)
Rx for CPAP sent to DME Needs followup office visit in 6 weeks

## 2014-04-20 NOTE — Sleep Study (Signed)
[place]  NAME: [place] OF BIRTH: 05-01-1976  MEDICAL RECORD NUMBER [id number]  LOCATION: [facility]  PHYSICIAN: ALVA,RAKESH V.  DATE OF STUDY: 04/19/14   SLEEP STUDY TYPE: CPAP titration study               REFERRING PHYSICIAN: Rigoberto Noel, MD  INDICATION FOR STUDY: 38 year old obese woman with loud snoring and witnessed apneas. Home sleep study showed severe OSA, AHI 83 per hour. At the time of this study ,they weighed 258 pounds with a height of  5 ft 1 inches and the BMI of 49, neck size of 16 inches. Epworth sleepiness score was 13   This CPAP titration polysomnogram was performed with a sleep technologist in attendance. EEG, EOG,EMG and respiratory parameters recorded. Sleep stages, arousals, limb movements and respiratory data was scored according to criteria laid out by the American Academy of sleep medicine.  SLEEP ARCHITECTURE: Lights out was at 2213 PM and lights on was at 501 AM. Total sleep time was 346 minutes with sleep period time of 350 minutes and sleep efficiency of 85% .Sleep latency was 57 minutes with latency to REM sleep of 137 minutes and wake after sleep onset of 5 minutes.  Sleep stages as a percentage of total sleep time was N1 -1.4 %,N2- 78 % and REM sleep 20 % ( 70 minutes) . The longest period of REM sleep was around 4:30 AM.   AROUSAL DATA : There were 8 arousals with an arousal index of 1.4 events per hour. Of these 7 were spontaneous, and 1 were associated with respiratory events and 0 were associated periodic limb movements  RESPIRATORY DATA: CPAP was initiated at 5 centimeters and titrated to a final level of 8 centimeters due to respiratory events and snoring. At the final level of 7 centimeters of L3 21 minutes, there were 0 to obstructive apneas, 0 central apneas, 1 mixed apneas and 1 hypopneas with apnea -hypopnea index of 0.6 events per hour.  There was no relation to sleep stage or body  position. Titration was optimal.  MOVEMENT/PARASOMNIA: There were 46 PLMS with a PLM index of 8 events per hour. The PLM arousal index was 0 events per hour.  OXYGEN DATA: The lowest desaturation was 90 % during non-REM sleep and the desaturation index was 2 per hour. The saturations stayed below 88% for 0 minutes.  CARDIAC DATA: The low heart rate was 30 to beats per minute. The high heart rate recorded was an artifact. No arrhythmias were noted  Discussion-she was desensitized with a small fullface mask. Supine REM sleep was noted. Titration was optimal.  IMPRESSION :  1. severe obstructive sleep apnea with hypopneas causing sleep fragmentation and mild oxygen desaturation. 2. This was corrected by CPAP of 8 centimeters with a small fullface mask. Titration was optimal. 3. No evidence of cardiac arrhythmias or behavioral disturbance during sleep. 4. Periodic limb movements were noted, but not associated with arousals. The significance of this is unclear.  RECOMMENDATION:    1. The treatment options for this degree of sleep disordered breathing includes weight loss and CPAP therapy. CPAP can be initiated at 8 centimeters with a small fullface mask and compliance monitored at this level. 2. Patient should be cautioned against driving when sleepy 3. They should be asked to avoid medications with sedative side effects  Rigoberto Noel  MD Diplomate, American Board of Sleep Medicine  ELECTRONICALLY SIGNED ON: 04/20/2014  [facility] PH: [phone]   FX: [phone] [place]

## 2014-04-23 NOTE — Telephone Encounter (Signed)
I spoke with patient about results and he verbalized understanding and had no questions appt scheduled Will forward to [facility] as an [place]

## 2014-05-25 ENCOUNTER — Encounter (HOSPITAL_BASED_OUTPATIENT_CLINIC_OR_DEPARTMENT_OTHER): Payer: Self-pay

## 2014-06-06 ENCOUNTER — Ambulatory Visit: Payer: Self-pay | Admitting: Pulmonary Disease

## 2014-06-08 ENCOUNTER — Encounter: Payer: Self-pay | Admitting: Pulmonary Disease

## 2014-09-26 ENCOUNTER — Other Ambulatory Visit: Payer: Self-pay | Admitting: Obstetrics and Gynecology

## 2014-09-27 LAB — CYTOLOGY - PAP

## 2017-10-01 ENCOUNTER — Encounter: Payer: Self-pay | Admitting: Family Medicine

## 2017-10-01 ENCOUNTER — Ambulatory Visit: Payer: Medicaid Other | Admitting: Family Medicine

## 2017-10-01 ENCOUNTER — Telehealth: Payer: Self-pay | Admitting: Family Medicine

## 2017-10-01 VITALS — BP 126/72 | HR 97 | Temp 97.8°F | Resp 18 | Ht 61.0 in | Wt 295.4 lb

## 2017-10-01 DIAGNOSIS — Z6841 Body Mass Index (BMI) 40.0 and over, adult: Secondary | ICD-10-CM

## 2017-10-01 DIAGNOSIS — F332 Major depressive disorder, recurrent severe without psychotic features: Secondary | ICD-10-CM | POA: Diagnosis not present

## 2017-10-01 DIAGNOSIS — M797 Fibromyalgia: Secondary | ICD-10-CM | POA: Diagnosis not present

## 2017-10-01 DIAGNOSIS — E559 Vitamin D deficiency, unspecified: Secondary | ICD-10-CM | POA: Diagnosis not present

## 2017-10-01 DIAGNOSIS — E890 Postprocedural hypothyroidism: Secondary | ICD-10-CM | POA: Diagnosis not present

## 2017-10-01 DIAGNOSIS — R5383 Other fatigue: Secondary | ICD-10-CM | POA: Diagnosis not present

## 2017-10-01 DIAGNOSIS — F419 Anxiety disorder, unspecified: Secondary | ICD-10-CM

## 2017-10-01 DIAGNOSIS — Z9889 Other specified postprocedural states: Secondary | ICD-10-CM | POA: Diagnosis not present

## 2017-10-01 DIAGNOSIS — M549 Dorsalgia, unspecified: Secondary | ICD-10-CM | POA: Diagnosis not present

## 2017-10-01 DIAGNOSIS — G8929 Other chronic pain: Secondary | ICD-10-CM

## 2017-10-01 DIAGNOSIS — E282 Polycystic ovarian syndrome: Secondary | ICD-10-CM

## 2017-10-01 DIAGNOSIS — Z9009 Acquired absence of other part of head and neck: Secondary | ICD-10-CM | POA: Insufficient documentation

## 2017-10-01 NOTE — Telephone Encounter (Signed)
Could you please work on extracting/requesting the following information:  - Thyroidectomy/Multinodular goiter - unsure of endocrinologist - Most recent notes from Dr. Estanislado Pandy [facility]ology regarding Fibromyalgia - Notes/Labs/Imaging from Dr. Helane Rima at [facility] - she has been providing pt's interim care. - Notes/Labs/Imaging from Dr. Delia Chimes - pt's former PCP. - Orthopedic records regarding spinal fusion and chronic back pain.  Thank you!

## 2017-10-01 NOTE — Progress Notes (Signed)
Name: [name]   MRN: [id number]    DOB: [birth year]   Date:10/01/2017       Progress Note  Subjective  Chief Complaint  Chief Complaint  Patient presents with  . Establish Care  . Referral    Psychiatry, ENT,   . Hypothyroidism    HPI  Pt presents to establish care with our office, she used to see Dr. Delia Chimes, but her office closed.  Pt has not had care in about a year.  Her GYN, Dr. Helane Rima ([facility]), had been prescribing her medications in the interim.  Hypothyroidism w/ history multinodular goiter and thyroidectomy: she had her thyroid removed 2002(?). Has been Armor 180mg . Endorses fatigue, some brittle nails.  No hair or skin changes.  Occasional heart palpitations.  No chest pain or shortness of breath.  Depression and anxiety: She has struggled with depression for most of her life; it is made worse with chronic pain from fibromyalgia, weight gain, family stress. 4 children, married - does have good support with her husband; 17yo twins live at home full time, 44yo in college at [employer] and is home frequently, and 21yo daughter in the [employer].  She denies SI/HI, no hallucinations, feels safe at home with her children.  She endorses fatigue, some crying spells, lack of motivation, insomnia.  She takes ambien, clonazepam, celexa.  We will refer to psychiatry and psychology today.  Advised that our practice is unable to prescribe benzodiazepine medications - pt voices understanding.  Fibromyalgia: She used to see Dr. Estanislado Pandy, but could not afford to go when she lost insurance several years ago.  She notes her pain has been particularly bad the last few weeks.  Takes celexa, neurontin, prednisone 5mg , and voltaren daily.  She endorses significant fatigue.  We will refer to rheumatology today.  Obesity: She has struggled with her weight her entire life and feels very motivated to lose weight because she has so much pain and feels this is related to her weight gain.  She would  like to see a nutritionist or medical weight management.  She is taking Meformin for PCOS - says she feels shakey when she does not take this medication.  We will check fasting cholesterol and BG at next visit.    PCOS: Takes Metformin - prescribed by Dr. Helane Rima.  She will maintain follow up for this issue. Diagnosed 2003; had hysterectomy   GERD: Has never been officially diagnosed, takes prilosec OTC.  If she does not take prilosec she gets burning and "sour stomach"; no regurgitation.  Discussed risk of PPI's, not willing to switch today.  No blood in stools, no dark and tarry stools, no vomiting.  History of spinal fusion and chronic back pain: She notes history of multiple spinal fusions.  She has ongoing headaches related to this chronic pain.  Denies extremity weakness; occasional tingling in RLE (better after her most recent surgery in 2006).  We will hold on referral to spine today, but she will consider in the future.  Patient Active Problem List   Diagnosis Date Noted  . 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/01/2017  . Chronic midline back pain 10/01/2017  . Vitamin D deficiency 10/01/2017  . Anxiety 10/01/2017  . PCOS (polycystic ovarian syndrome) 10/01/2017  . Severe episode of recurrent major depressive disorder, without psychotic features ([place]) 10/01/2017  . BMI 50.0-59.9, adult ([place]) 10/01/2017  . Fibromyalgia 10/01/2017  . Postoperative hypothyroidism 10/01/2017  . OSA (obstructive sleep apnea) 02/26/2014    Past  Surgical History:  Procedure Laterality Date  . ABDOMINAL HYSTERECTOMY    . CESAREAN SECTION    . CHOLECYSTECTOMY    . SPINAL FUSION    . THYROIDECTOMY    . TONSILLECTOMY    . TUBAL LIGATION      Family History  Problem Relation Age of Onset  . Cancer Maternal Uncle   . Heart disease Father   . Heart disease Paternal Grandfather   . Breast cancer Maternal Grandmother   . Rheum arthritis Mother   . Rheum arthritis Maternal Grandfather   . Rheum  arthritis Maternal Aunt     Social History   Socioeconomic History  . Marital status: Married    Spouse name: Not on file  . Number of children: 4  . Years of education: Not on file  . Highest education level: Not on file  Social Needs  . Financial resource strain: Not on file  . Food insecurity - worry: Not on file  . Food insecurity - inability: Not on file  . Transportation needs - medical: Not on file  . Transportation needs - non-medical: Not on file  Occupational History  . Occupation: stay at home mother  Tobacco Use  . Smoking status: Former Smoker    Packs/day: 0.25    Years: 12.00    Pack years: 3.00    Types: Cigarettes    Last attempt to quit: 09/07/2004    Years since quitting: 13.0  . Smokeless tobacco: Never Used  Substance and Sexual Activity  . Alcohol use: Yes    Comment: 4 times a year  . Drug use: No  . Sexual activity: Yes  Other Topics Concern  . Not on file  Social History Narrative  . Not on file     Current Outpatient Medications:  .  citalopram (CELEXA) 20 MG tablet, Take 20 mg by mouth daily.  , Disp: , Rfl:  .  clonazePAM (KLONOPIN) 0.5 MG tablet, Take 0.5 mg by mouth 3 (three) times daily as needed for anxiety., Disp: , Rfl:  .  diclofenac (VOLTAREN) 75 MG EC tablet, Take 75 mg by mouth 2 (two) times daily., Disp: , Rfl:  .  gabapentin (NEURONTIN) 100 MG capsule, Take 100 mg by mouth 3 (three) times daily., Disp: , Rfl:  .  metFORMIN (GLUCOPHAGE) 500 MG tablet, Take 1,000 mg by mouth daily., Disp: , Rfl:  .  omeprazole (PRILOSEC) 20 MG capsule, Take 20 mg by mouth daily., Disp: , Rfl:  .  thyroid (ARMOUR) 180 MG tablet, Take 180 mg by mouth daily., Disp: , Rfl:  .  VITAMIN D, CHOLECALCIFEROL, PO, Take 50 mcg by mouth 2 (two) times daily., Disp: , Rfl:  .  zolpidem (AMBIEN) 5 MG tablet, Take 10 mg by mouth at bedtime as needed. , Disp: , Rfl:  .  predniSONE (DELTASONE) 5 MG tablet, Take 1 tablet (5 mg total) by mouth daily with breakfast.,  Disp: , Rfl:   Allergies  Allergen Reactions  . Erythromycin   . Zyrtec [Cetirizine Hcl]     ROS Ten systems reviewed and is negative except as mentioned in HPI  Objective  Vitals:   10/01/17 0945  BP: 126/72  Pulse: 97  Resp: 18  Temp: 97.8 F (36.6 C)  TempSrc: Oral  SpO2: 94%  Weight: 295 lb 6.4 oz (134 kg)  Height: 5\' 1"  (1.549 m)   Body mass index is 55.82 kg/m.  Physical Exam Constitutional: Patient appears well-developed and well-nourished. No distress.  HENT: Head: Normocephalic and atraumatic. Mouth/Throat: Oropharynx is clear and moist. No oropharyngeal exudate.  Eyes: Conjunctivae and EOM are normal. Pupils are equal, round, and reactive to light. No scleral icterus.  Neck: Normal range of motion. Neck supple. No JVD present. Unable to palpate thyroid, swallow is smooth. Cardiovascular: Normal rate, regular rhythm and normal heart sounds.  No murmur heard. No BLE edema. Pulmonary/Chest: Effort normal and breath sounds normal. No respiratory distress. Musculoskeletal: Normal range of motion, no joint effusions. No gross deformities Neurological: she is alert and oriented to person, place, and time. No cranial nerve deficit. Coordination, balance, strength, speech and gait are normal.  Skin: Skin is warm and dry. No rash noted. No erythema.  Psychiatric: Patient has a anxious mood at times - appropriate to discussion; affect is normal. behavior is normal. Judgment and thought content normal.  No results found for this or any previous visit (from the past 72 hour(s)).  PHQ2/9: Depression screen PHQ 2/9 10/01/2017  Decreased Interest 3  Down, Depressed, Hopeless 3  PHQ - 2 Score 6  Altered sleeping 2  Tired, decreased energy 3  Change in appetite 2  Feeling bad or failure about yourself  3  Trouble concentrating 3  Moving slowly or fidgety/restless 3  Suicidal thoughts 0  PHQ-9 Score 22  Difficult doing work/chores Extremely dIfficult   Fall Risk: Fall  Risk  10/01/2017  Falls in the past year? No    Assessment & Plan  1. Postoperative hypothyroidism - Continue Armor 180mg  - TSH  2. Fibromyalgia - We will request records today; Advised that because her pain has been poorly controlled, and she is taking several medications already, it is best to refer to Rheumatology for further evaluation. - Ambulatory referral to Rheumatology  3. BMI 50.0-59.9, adult ([facility]) - COMPLETE METABOLIC PANEL WITH GFR - CBC w/Diff/Platelet - Hemoglobin A1c - Amb Ref to Medical Weight Management - Pt verbalizes significant motivation to lose weight - motivating factors including chronic pain, being a good role model for her children, wanting to become healthier, and wanting to work on her mental health as well.  4. Severe episode of recurrent major depressive disorder, without psychotic features ([place]) - Pt has been taking Celexa, Ambien, and Clonazepam for quite some time. She is aware that our clinic is unable to prescribe Clonazepam and Ambien, and she is very willing to see psychiatry.  She is also interested in counseling - psychology referral is placed. - Ambulatory referral to Psychiatry - Ambulatory referral to Psychology - We will maintain close follow up with pt until able to be seen by psychiatry.  5. PCOS (polycystic ovarian syndrome) - Amb Ref to Medical Weight Management  6. History of subtotal thyroidectomy - Continue Armor 180mg  - TSH  7. Anxiety - Ambulatory referral to Psychiatry - Ambulatory referral to Psychology  8. Fatigue, unspecified type - TSH - COMPLETE METABOLIC PANEL WITH GFR - CBC w/Diff/Platelet - Hemoglobin A1c - VITAMIN D 25 Hydroxy (Vit-D Deficiency, Fractures) - Likely multifactorial - depression screening is high; unable to see previous labs, but she notes it has been quite some time since she has had labs checked.    9. Vitamin D deficiency - VITAMIN D 25 Hydroxy (Vit-D Deficiency, Fractures)  10. Chronic  midline back pain, unspecified back location - We will attempt to obtain records regarding pt reported spinal fusion and chronic back pain.   - Return in about 2 weeks (around 10/15/2017) for 2 Week Follow up w/ fasting labs;  Schedule separate CPE.  -Reviewed Health Maintenance: Will address at CPE.

## 2017-10-02 LAB — CBC WITH DIFFERENTIAL/PLATELET
Basophils Absolute: 42 cells/uL (ref 0–200)
Basophils Relative: 0.6 %
Eosinophils Absolute: 140 cells/uL (ref 15–500)
Eosinophils Relative: 2 %
HCT: 41.2 % (ref 35.0–45.0)
Hemoglobin: 14.2 g/dL (ref 11.7–15.5)
Lymphs Abs: 2331 cells/uL (ref 850–3900)
MCH: 28.3 pg (ref 27.0–33.0)
MCHC: 34.5 g/dL (ref 32.0–36.0)
MCV: 82.1 fL (ref 80.0–100.0)
MPV: 9.9 fL (ref 7.5–12.5)
Monocytes Relative: 7.9 %
Neutro Abs: 3934 cells/uL (ref 1500–7800)
Neutrophils Relative %: 56.2 %
Platelets: 244 10*3/uL (ref 140–400)
RBC: 5.02 10*6/uL (ref 3.80–5.10)
RDW: 12.5 % (ref 11.0–15.0)
Total Lymphocyte: 33.3 %
WBC mixed population: 553 cells/uL (ref 200–950)
WBC: 7 10*3/uL (ref 3.8–10.8)

## 2017-10-02 LAB — COMPLETE METABOLIC PANEL WITH GFR
AG Ratio: 1.4 (calc) (ref 1.0–2.5)
ALBUMIN MSPROF: 4.2 g/dL (ref 3.6–5.1)
ALT: 30 U/L — ABNORMAL HIGH (ref 6–29)
AST: 18 U/L (ref 10–30)
Alkaline phosphatase (APISO): 87 U/L (ref 33–115)
BUN: 16 mg/dL (ref 7–25)
CALCIUM: 9.4 mg/dL (ref 8.6–10.2)
CO2: 25 mmol/L (ref 20–32)
CREATININE: 0.53 mg/dL (ref 0.50–1.10)
Chloride: 105 mmol/L (ref 98–110)
GFR, EST NON AFRICAN AMERICAN: 116 mL/min/{1.73_m2} (ref >=60)
GFR, Est African American: 135 mL/min/{1.73_m2} (ref >=60)
Globulin: 3 g/dL (calc) (ref 1.9–3.7)
Glucose, Bld: 90 mg/dL (ref 65–99)
Potassium: 4.2 mmol/L (ref 3.5–5.3)
Sodium: 139 mmol/L (ref 135–146)
Total Bilirubin: 0.3 mg/dL (ref 0.2–1.2)
Total Protein: 7.2 g/dL (ref 6.1–8.1)

## 2017-10-02 LAB — HEMOGLOBIN A1C
EAG (MMOL/L): 7 (calc)
HEMOGLOBIN A1C: 6 %{Hb} — AB (ref <5.7)
MEAN PLASMA GLUCOSE: 126 (calc)

## 2017-10-02 LAB — VITAMIN D 25 HYDROXY (VIT D DEFICIENCY, FRACTURES): Vit D, 25-Hydroxy: 35 ng/mL (ref 30–100)

## 2017-10-02 LAB — TSH: TSH: 0.01 mIU/L — ABNORMAL LOW

## 2017-10-04 NOTE — Telephone Encounter (Signed)
FYI

## 2017-10-04 NOTE — Telephone Encounter (Signed)
[name] Please make appointment

## 2017-10-04 NOTE — Telephone Encounter (Signed)
Per Practice Manager patient must sign medical release at next appointment for [place] to get their records

## 2017-10-04 NOTE — Telephone Encounter (Signed)
Documentation reviewed. We will have patient sign at follow up visit

## 2017-10-05 ENCOUNTER — Telehealth: Payer: Self-pay | Admitting: Family Medicine

## 2017-10-05 DIAGNOSIS — E890 Postprocedural hypothyroidism: Secondary | ICD-10-CM

## 2017-10-05 NOTE — Telephone Encounter (Signed)
Will route request to [facility] pool for final disposition

## 2017-10-05 NOTE — Telephone Encounter (Signed)
Called patient to inquiry as to what she needed a refill on and patient stated that she is requesting a refill on Armour thyroid 180mg .  She takes one tablet by mouth daily.

## 2017-10-06 ENCOUNTER — Encounter: Payer: Self-pay | Admitting: Family Medicine

## 2017-10-06 MED ORDER — THYROID 180 MG PO TABS: 180.0000 mg | ORAL_TABLET | Freq: Every day | ORAL | 0 refills | Status: DC

## 2017-10-06 NOTE — Addendum Note (Signed)
Addended by: Hubbard Hartshorn on: 10/06/2017 10:08 AM   Modules accepted: Orders

## 2017-10-06 NOTE — Telephone Encounter (Signed)
Patient would like a refill on Thyroid medication sent to [facility]

## 2017-10-06 NOTE — Telephone Encounter (Signed)
Patient notified

## 2017-10-06 NOTE — Telephone Encounter (Signed)
45 day supply is provided - she needs to return the week of March 11 for TSH check to obtain additional refills.  TSH is ordered.  Please again remind her to take only 1/2 tablet (90mg ) on Sundays, otherwise continue 1 tablet daily.

## 2017-10-08 ENCOUNTER — Encounter: Payer: Self-pay | Admitting: Family Medicine

## 2017-10-08 ENCOUNTER — Ambulatory Visit: Payer: Medicaid Other | Admitting: Family Medicine

## 2017-10-08 VITALS — BP 122/74 | HR 85 | Temp 97.8°F | Resp 16 | Ht 61.0 in | Wt 293.7 lb

## 2017-10-08 DIAGNOSIS — Z13220 Encounter for screening for lipoid disorders: Secondary | ICD-10-CM

## 2017-10-08 DIAGNOSIS — Z6841 Body Mass Index (BMI) 40.0 and over, adult: Secondary | ICD-10-CM

## 2017-10-08 DIAGNOSIS — H6501 Acute serous otitis media, right ear: Secondary | ICD-10-CM | POA: Diagnosis not present

## 2017-10-08 LAB — LIPID PANEL
CHOL/HDL RATIO: 2.9 (calc) (ref <5.0)
Cholesterol: 132 mg/dL (ref <200)
HDL: 46 mg/dL — ABNORMAL LOW (ref >50)
LDL CHOLESTEROL (CALC): 69 mg/dL
NON-HDL CHOLESTEROL (CALC): 86 mg/dL (ref <130)
Triglycerides: 87 mg/dL (ref <150)

## 2017-10-08 MED ORDER — FLUTICASONE PROPIONATE 50 MCG/ACT NA SUSP: 2.0000 | Freq: Every day | NASAL | 6 refills | Status: DC

## 2017-10-08 MED ORDER — LORATADINE 10 MG PO TABS: 10.0000 mg | ORAL_TABLET | Freq: Every day | ORAL | 0 refills | Status: DC

## 2017-10-08 MED ORDER — AMOXICILLIN 500 MG PO TABS: 500.0000 mg | ORAL_TABLET | Freq: Two times a day (BID) | ORAL | 0 refills | Status: AC

## 2017-10-08 NOTE — Patient Instructions (Addendum)
Otitis Media, Adult Otitis media is redness, soreness, and puffiness (swelling) in the space just behind your eardrum (middle ear). It may be caused by allergies or infection. It often happens along with a cold. Follow these instructions at home:  Take your medicine as told. Finish it even if you start to feel better.  Only take over-the-counter or prescription medicines for pain, discomfort, or fever as told by your doctor.  Follow up with your doctor as told. Contact a doctor if:  You have otitis media only in one ear, or bleeding from your nose, or both.  You notice a lump on your neck.  You are not getting better in 3-5 days.  You feel worse instead of better. Get help right away if:  You have pain that is not helped with medicine.  You have puffiness, redness, or pain around your ear.  You get a stiff neck.  You cannot move part of your face (paralysis).  You notice that the bone behind your ear hurts when you touch it. This information is not intended to replace advice given to you by your health care provider. Make sure you discuss any questions you have with your health care provider. Document Released: 02/10/2008 Document Revised: 01/30/2016 Document Reviewed: 03/21/2013 Elsevier Interactive Patient Education  2017 [employer]  

## 2017-10-08 NOTE — Progress Notes (Signed)
Name: [name]   MRN: [id number]    DOB: 1976-01-20   Date:10/08/2017       Progress Note  Subjective  Chief Complaint  Chief Complaint  Patient presents with  . Otalgia    RIGHT ear for 4 days, throbbing, feels like water in ear  . Dizziness    HPI  Pt presents for acute visit regarding RIGHT sided otalgia for 4 days.  Her ear started out sore and felt a little swollen, yesterday she started to feel some throbbing pain, became worse overnight, so she decided to come in today.  Occasionally having sharp pains and some itching.  This morning, the throbbing has improved after taking Tylenol and applying some heat.  Ear still feels full.  Endorses some dizziness with her symptoms - has had vertigo in the past and says this is not nearly as bad.  Endorses sinus congestion, mild headache, tenderness of lymph nodes around the ear/right side of neck.  Denies sore throat, cough, chest pain, or shortness of breath.  - She is coming in next week for follow up, and would like to do labs today as she is fasting.  We will order today and review at her upcoming appointment.  Patient Active Problem List   Diagnosis Date Noted  . 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/01/2017  . Chronic midline back pain 10/01/2017  . Vitamin D deficiency 10/01/2017  . Anxiety 10/01/2017  . PCOS (polycystic ovarian syndrome) 10/01/2017  . Severe episode of recurrent major depressive disorder, without psychotic features ([place]) 10/01/2017  . BMI 50.0-59.9, adult ([place]) 10/01/2017  . Fibromyalgia 10/01/2017  . Postoperative hypothyroidism 10/01/2017  . OSA (obstructive sleep apnea) 02/26/2014    Social History   Tobacco Use  . Smoking status: Former Smoker    Packs/day: 0.25    Years: 12.00    Pack years: 3.00    Types: Cigarettes    Last attempt to quit: 09/07/2004    Years since quitting: 13.0  . Smokeless tobacco: Never Used  Substance Use Topics  . Alcohol use: Yes    Comment: 4 times a year      Current Outpatient Medications:  .  citalopram (CELEXA) 20 MG tablet, Take 20 mg by mouth daily.  , Disp: , Rfl:  .  clonazePAM (KLONOPIN) 0.5 MG tablet, Take 0.5 mg by mouth 3 (three) times daily as needed for anxiety., Disp: , Rfl:  .  diclofenac (VOLTAREN) 75 MG EC tablet, Take 75 mg by mouth 2 (two) times daily., Disp: , Rfl:  .  gabapentin (NEURONTIN) 100 MG capsule, Take 100 mg by mouth 3 (three) times daily., Disp: , Rfl:  .  metFORMIN (GLUCOPHAGE) 500 MG tablet, Take 1,000 mg by mouth daily., Disp: , Rfl:  .  omeprazole (PRILOSEC) 20 MG capsule, Take 20 mg by mouth daily., Disp: , Rfl:  .  predniSONE (DELTASONE) 5 MG tablet, Take 1 tablet (5 mg total) by mouth daily with breakfast., Disp: , Rfl:  .  thyroid (ARMOUR) 180 MG tablet, Take 1 tablet (180 mg total) by mouth daily. Except on Sundays - Take 1/2 tablet only., Disp: 45 tablet, Rfl: 0 .  VITAMIN D, CHOLECALCIFEROL, PO, Take 50 mcg by mouth 2 (two) times daily., Disp: , Rfl:  .  zolpidem (AMBIEN) 5 MG tablet, Take 10 mg by mouth at bedtime as needed. , Disp: , Rfl:   Allergies  Allergen Reactions  . Erythromycin   . Zyrtec [Cetirizine Hcl]     ROS  Ten systems reviewed and is negative except as mentioned in HPI  Objective  Vitals:   10/08/17 1014  BP: 122/74  Pulse: 85  Resp: 16  Temp: 97.8 F (36.6 C)  TempSrc: Oral  SpO2: 95%  Weight: 293 lb 11.2 oz (133.2 kg)  Height: 5\' 1"  (1.549 m)   Body mass index is 55.49 kg/m.  Nursing Note and Vital Signs reviewed.  Physical Exam  Constitutional: Patient appears well-developed and well-nourished. Obese. No distress.  HEENT: head atraumatic, normocephalic, pupils equal and reactive to light, EOM's intact, R canal is mildly erythematous, RIGHT TM is bulging, opaque, and surrounding tissue is inflamed. No drainage is present, no maxillary or frontal sinus tenderness , neck supple with moderate RIGHT pre-auricular and RIGHT submandibular lymphadenopathy that is  tender, oropharynx pink and moist without exudate, no tonsillar swelling Cardiovascular: Normal rate, regular rhythm, S1/S2 present.  No murmur or rub heard. No BLE edema. Pulmonary/Chest: Effort normal and breath sounds clear. No respiratory distress or retractions. Psychiatric: Patient has a normal mood and affect. behavior is normal. Judgment and thought content normal.  No results found for this or any previous visit (from the past 72 hour(s)).  Assessment & Plan  1. Right acute serous otitis media, recurrence not specified - fluticasone (FLONASE) 50 MCG/ACT nasal spray; Place 2 sprays into both nostrils daily.  Dispense: 16 g; Refill: 6 - loratadine (CLARITIN) 10 MG tablet; Take 1 tablet (10 mg total) by mouth daily.  Dispense: 30 tablet; Refill: 0 - amoxicillin (AMOXIL) 500 MG tablet; Take 1 tablet (500 mg total) by mouth 2 (two) times daily for 10 days.  Dispense: 20 tablet; Refill: 0  2. BMI 50.0-59.9, adult ([facility]CC) - Lipid panel 3. Screening for hyperlipidemia - Lipid panel  -Red flags and when to present for emergency care or RTC including fever >101.98F, chest pain, shortness of breath, new/worsening/un-resolving symptoms, severe ear pain, bleeding from ear reviewed with patient at time of visit. Follow up and care instructions discussed and provided in AVS.

## 2017-10-15 ENCOUNTER — Ambulatory Visit: Payer: Medicaid Other | Admitting: Family Medicine

## 2017-10-15 ENCOUNTER — Encounter: Payer: Self-pay | Admitting: Family Medicine

## 2017-10-15 ENCOUNTER — Telehealth: Payer: Self-pay | Admitting: Family Medicine

## 2017-10-15 VITALS — BP 128/78 | HR 103 | Temp 98.2°F | Resp 18 | Ht 61.0 in | Wt 289.8 lb

## 2017-10-15 DIAGNOSIS — M797 Fibromyalgia: Secondary | ICD-10-CM | POA: Diagnosis not present

## 2017-10-15 DIAGNOSIS — E282 Polycystic ovarian syndrome: Secondary | ICD-10-CM

## 2017-10-15 DIAGNOSIS — M549 Dorsalgia, unspecified: Secondary | ICD-10-CM

## 2017-10-15 DIAGNOSIS — Z9009 Acquired absence of other part of head and neck: Secondary | ICD-10-CM

## 2017-10-15 DIAGNOSIS — R7303 Prediabetes: Secondary | ICD-10-CM | POA: Diagnosis not present

## 2017-10-15 DIAGNOSIS — M950 Acquired deformity of nose: Secondary | ICD-10-CM

## 2017-10-15 DIAGNOSIS — J3489 Other specified disorders of nose and nasal sinuses: Secondary | ICD-10-CM | POA: Insufficient documentation

## 2017-10-15 DIAGNOSIS — F332 Major depressive disorder, recurrent severe without psychotic features: Secondary | ICD-10-CM

## 2017-10-15 DIAGNOSIS — Z6841 Body Mass Index (BMI) 40.0 and over, adult: Secondary | ICD-10-CM

## 2017-10-15 DIAGNOSIS — G8929 Other chronic pain: Secondary | ICD-10-CM

## 2017-10-15 DIAGNOSIS — E890 Postprocedural hypothyroidism: Secondary | ICD-10-CM

## 2017-10-15 DIAGNOSIS — H9201 Otalgia, right ear: Secondary | ICD-10-CM

## 2017-10-15 DIAGNOSIS — F419 Anxiety disorder, unspecified: Secondary | ICD-10-CM

## 2017-10-15 DIAGNOSIS — G4733 Obstructive sleep apnea (adult) (pediatric): Secondary | ICD-10-CM | POA: Diagnosis not present

## 2017-10-15 DIAGNOSIS — Z9889 Other specified postprocedural states: Secondary | ICD-10-CM | POA: Diagnosis not present

## 2017-10-15 DIAGNOSIS — K219 Gastro-esophageal reflux disease without esophagitis: Secondary | ICD-10-CM | POA: Insufficient documentation

## 2017-10-15 MED ORDER — METFORMIN HCL 500 MG PO TABS: 1000.0000 mg | ORAL_TABLET | Freq: Two times a day (BID) | ORAL | 0 refills | Status: DC

## 2017-10-15 NOTE — Telephone Encounter (Signed)
I was under the impression that this patient was going to send an email to the requested company since I was unable to get in contact with them, but I did leave a voicemail with this patient's phone number.

## 2017-10-15 NOTE — Telephone Encounter (Signed)
[name],   I was unable to get in contact (but a message was left) with the requested company but according to their website you can do a self referral and then setup an appointment that way.  Please let me know if there is anything else we can do on our end.

## 2017-10-15 NOTE — Patient Instructions (Addendum)
- Continue 1000mg  Metformin at night; for 3-4 days, take 500mg  once in the morning, then increase to 1000mg  twice daily.  [facility] [phone] - referral is placed and has been sent to Dr. Trudie Reed' office, please call to schedule an appointment.  Here are some resources to help you if you feel you are in a mental health crisis:  [place] - Call [phone]  for help - Website with more resources: [email or web address].[email or web address]  [facility] - Call [phone] for help. - Mobile Crisis Program available 24 hours a day, 365 days a year. - Available for anyone of any age in [place] counties.  [facility] - Address: [address], [place] - Telephone: [phone]  - Hours of Operation: Sunday - Saturday - 8:00 a.m. - 8:00 p.m. - Medicaid, Medicare (Government Issued Only), BCBS, and [place] [facility], [place], Psychiatrists on-site to provide medication management, [place], and Peer Support Care.  [facility] - Call [phone] for help. - Mobile Crisis Program available 24 hours a day, 365 days a year. - Available for anyone of any age in [place]   Liraglutide injection (Weight Management) What is this medicine? LIRAGLUTIDE (LIR a GLOO tide) is used with a reduced calorie diet and exercise to help you lose weight. This medicine may be used for other purposes; ask your health care provider or pharmacist if you have questions. COMMON BRAND NAME(S): Saxenda What should I tell my health care provider before I take this medicine? They need to know if you have any of these conditions: -endocrine tumors (MEN 2) or if someone in your family had these tumors -gallbladder disease -high cholesterol -history of alcohol abuse problem -history of  pancreatitis -kidney disease or if you are on dialysis -liver disease -previous swelling of the tongue, face, or lips with difficulty breathing, difficulty swallowing, hoarseness, or tightening of the throat -stomach problems -suicidal thoughts, plans, or attempt; a previous suicide attempt by you or a family member -thyroid cancer or if someone in your family had thyroid cancer -an unusual or allergic reaction to liraglutide, other medicines, foods, dyes, or preservatives -pregnant or trying to get pregnant -breast-feeding How should I use this medicine? This medicine is for injection under the skin of your upper leg, stomach area, or upper arm. You will be taught how to prepare and give this medicine. Use exactly as directed. Take your medicine at regular intervals. Do not take it more often than directed. It is important that you put your used needles and syringes in a special sharps container. Do not put them in a trash can. If you do not have a sharps container, call your pharmacist or healthcare provider to get one. A special MedGuide will be given to you by the pharmacist with each prescription and refill. Be sure to read this information carefully each time. Talk to your pediatrician regarding the use of this medicine in children. Special care may be needed. Overdosage: If you think you have taken too much of this medicine contact a poison control center or emergency room at once. NOTE: This medicine is only for you. Do not share this medicine with others. What if I miss a dose? If you miss a dose, take it as soon as you can. If it is almost time for your next dose, take only that dose. Do not take double or extra doses. If  you miss your dose for 3 days or more, call your doctor or health care professional to talk about how to restart this medicine. What may interact with this medicine? -insulin and other medicines for diabetes This list may not describe all possible interactions. Give  your health care provider a list of all the medicines, herbs, non-prescription drugs, or dietary supplements you use. Also tell them if you smoke, drink alcohol, or use illegal drugs. Some items may interact with your medicine. What should I watch for while using this medicine? Visit your doctor or health care professional for regular checks on your progress. This medicine is intended to be used in addition to a healthy diet and appropriate exercise. The best results are achieved this way. Do not increase or in any way change your dose without consulting your doctor or health care professional. Drink plenty of fluids while taking this medicine. Check with your doctor or health care professional if you get an attack of severe diarrhea, nausea, and vomiting. The loss of too much body fluid can make it dangerous for you to take this medicine. This medicine may affect blood sugar levels. If you have diabetes, check with your doctor or health care professional before you change your diet or the dose of your diabetic medicine. Patients and their families should watch out for worsening depression or thoughts of suicide. Also watch out for sudden changes in feelings such as feeling anxious, agitated, panicky, irritable, hostile, aggressive, impulsive, severely restless, overly excited and hyperactive, or not being able to sleep. If this happens, especially at the beginning of treatment or after a change in dose, call your health care professional. What side effects may I notice from receiving this medicine? Side effects that you should report to your doctor or health care professional as soon as possible: -allergic reactions like skin rash, itching or hives, swelling of the face, lips, or tongue -breathing problems -diarrhea that continues or is severe -lump or swelling on the neck -severe nausea -signs and symptoms of infection like fever or chills; cough; sore throat; pain or trouble passing urine -signs and  symptoms of low blood sugar such as feeling anxious, confusion, dizziness, increased hunger, unusually weak or tired, sweating, shakiness, cold, irritable, headache, blurred vision, fast heartbeat, loss of consciousness -signs and symptoms of kidney injury like trouble passing urine or change in the amount of urine -trouble swallowing -unusual stomach upset or pain -vomiting Side effects that usually do not require medical attention (report to your doctor or health care professional if they continue or are bothersome): -constipation -decreased appetite -diarrhea -fatigue -headache -nausea -pain, redness, or irritation at site where injected -stomach upset -stuffy or runny nose This list may not describe all possible side effects. Call your doctor for medical advice about side effects. You may report side effects to FDA at 1-800-FDA-1088. Where should I keep my medicine? Keep out of the reach of children. Store unopened pen in a refrigerator between 2 and 8 degrees C (36 and 46 degrees F). Do not freeze or use if the medicine has been frozen. Protect from light and excessive heat. After you first use the pen, it can be stored at room temperature between 15 and 30 degrees C (59 and 86 degrees F) or in a refrigerator. Throw away your used pen after 30 days or after the expiration date, whichever comes first. Do not store your pen with the needle attached. If the needle is left on, medicine may leak from the pen.  NOTE: This sheet is a summary. It may not cover all possible information. If you have questions about this medicine, talk to your doctor, pharmacist, or health care provider.  2018 Elsevier/Gold Standard (2016-09-10 14:41:37)  Mediterranean Diet A Mediterranean diet refers to food and lifestyle choices that are based on the traditions of countries located on the [employer]. This way of eating has been shown to help prevent certain conditions and improve outcomes for people who  have chronic diseases, like kidney disease and heart disease. What are tips for following this plan? Lifestyle  Cook and eat meals together with your family, when possible.  Drink enough fluid to keep your urine clear or pale yellow.  Be physically active every day. This includes: ? Aerobic exercise like running or swimming. ? Leisure activities like gardening, walking, or housework.  Get 7-8 hours of sleep each night.  If recommended by your health care provider, drink red wine in moderation. This means 1 glass a day for nonpregnant women and 2 glasses a day for men. A glass of wine equals 5 oz (150 mL). Reading food labels  Check the serving size of packaged foods. For foods such as rice and pasta, the serving size refers to the amount of cooked product, not dry.  Check the total fat in packaged foods. Avoid foods that have saturated fat or trans fats.  Check the ingredients list for added sugars, such as corn syrup. Shopping  At the grocery store, buy most of your food from the areas near the walls of the store. This includes: ? Fresh fruits and vegetables (produce). ? Grains, beans, nuts, and seeds. Some of these may be available in unpackaged forms or large amounts (in bulk). ? Fresh seafood. ? Poultry and eggs. ? Low-fat dairy products.  Buy whole ingredients instead of prepackaged foods.  Buy fresh fruits and vegetables in-season from local farmers markets.  Buy frozen fruits and vegetables in resealable bags.  If you do not have access to quality fresh seafood, buy precooked frozen shrimp or canned fish, such as tuna, salmon, or sardines.  Buy small amounts of raw or cooked vegetables, salads, or olives from the deli or salad bar at your store.  Stock your pantry so you always have certain foods on hand, such as olive oil, canned tuna, canned tomatoes, rice, pasta, and beans. Cooking  Cook foods with extra-virgin olive oil instead of using butter or other vegetable  oils.  Have meat as a side dish, and have vegetables or grains as your main dish. This means having meat in small portions or adding small amounts of meat to foods like pasta or stew.  Use beans or vegetables instead of meat in common dishes like chili or lasagna.  Experiment with different cooking methods. Try roasting or broiling vegetables instead of steaming or sauteing them.  Add frozen vegetables to soups, stews, pasta, or rice.  Add nuts or seeds for added healthy fat at each meal. You can add these to yogurt, salads, or vegetable dishes.  Marinate fish or vegetables using olive oil, lemon juice, garlic, and fresh herbs. Meal planning  Plan to eat 1 vegetarian meal one day each week. Try to work up to 2 vegetarian meals, if possible.  Eat seafood 2 or more times a week.  Have healthy snacks readily available, such as: ? Vegetable sticks with hummus. ? [place] yogurt. ? Fruit and nut trail mix.  Eat balanced meals throughout the week. This includes: ? Fruit: 2-3 servings  a day ? Vegetables: 4-5 servings a day ? Low-fat dairy: 2 servings a day ? Fish, poultry, or lean meat: 1 serving a day ? Beans and legumes: 2 or more servings a week ? Nuts and seeds: 1-2 servings a day ? Whole grains: 6-8 servings a day ? Extra-virgin olive oil: 3-4 servings a day  Limit red meat and sweets to only a few servings a month What are my food choices?  Mediterranean diet ? Recommended ? Grains: Whole-grain pasta. Brown rice. Bulgar wheat. Polenta. Couscous. Whole-wheat bread. [place]. ? Vegetables: Artichokes. Beets. Broccoli. Cabbage. Carrots. Eggplant. Green beans. Chard. Kale. Spinach. Onions. Leeks. Peas. Squash. Tomatoes. Peppers. Radishes. ? Fruits: Apples. Apricots. Avocado. Berries. Bananas. Cherries. Dates. Figs. Grapes. Lemons. Melon. Oranges. [name]. Plums. Pomegranate. ? Meats and other protein foods: Beans. Almonds. Sunflower seeds. Pine nuts. Peanuts. [place]. Salmon.  Scallops. Shrimp. [place]. Tilapia. Clams. Oysters. Eggs. ? Dairy: Low-fat milk. Cheese. Greek yogurt. ? Beverages: Water. Red wine. Herbal tea. ? Fats and oils: Extra virgin olive oil. Avocado oil. Grape seed oil. ? Sweets and desserts: [place] yogurt with honey. Baked apples. Poached pears. Trail mix. ? Seasoning and other foods: Basil. Cilantro. Coriander. Cumin. Mint. Parsley. Sage. Rosemary. Tarragon. Garlic. Oregano. Thyme. Pepper. Balsalmic vinegar. Tahini. Hummus. Tomato sauce. Olives. Mushrooms. ? Limit these ? Grains: Prepackaged pasta or rice dishes. Prepackaged cereal with added sugar. ? Vegetables: Deep fried potatoes (french fries). ? Fruits: Fruit canned in syrup. ? Meats and other protein foods: Beef. Pork. Lamb. Poultry with skin. Hot dogs. [place] Salines. ? Dairy: Ice cream. Sour cream. Whole milk. ? Beverages: Juice. Sugar-sweetened soft drinks. Beer. Liquor and spirits. ? Fats and oils: Butter. Canola oil. Vegetable oil. Beef fat (tallow). Lard. ? Sweets and desserts: Cookies. Cakes. Pies. Candy. ? Seasoning and other foods: Mayonnaise. Premade sauces and marinades. ? The items listed may not be a complete list. Talk with your dietitian about what dietary choices are right for you. Summary  The Mediterranean diet includes both food and lifestyle choices.  Eat a variety of fresh fruits and vegetables, beans, nuts, seeds, and whole grains.  Limit the amount of red meat and sweets that you eat.  Talk with your health care provider about whether it is safe for you to drink red wine in moderation. This means 1 glass a day for nonpregnant women and 2 glasses a day for men. A glass of wine equals 5 oz (150 mL). This information is not intended to replace advice given to you by your health care provider. Make sure you discuss any questions you have with your health care provider. Document Released: 04/16/2016 Document Revised: 05/19/2016 Document Reviewed: 04/16/2016 Elsevier Interactive  Patient Education  [employer].

## 2017-10-15 NOTE — Telephone Encounter (Signed)
Could you please look into psychiatry and psychology referrals for this patient? I see that psychiatry initial referral was kicked back because they did not accept medicaid, have we sent her referral elsewhere? Thanks!

## 2017-10-15 NOTE — Progress Notes (Signed)
Name: [name]   MRN: [id number]    DOB: 1976-05-06   Date:10/15/2017       Progress Note  Subjective  Chief Complaint  Chief Complaint  Patient presents with  . Follow-up    2 week recheck    HPI  Hypothyroidism w/ history multinodular goiter and thyroidectomy: she had her thyroid removed 2002(?). Has been Armor 180mg  - TSH was low at last check, so she is now taking 1/2 tab only on Sundays. Endorses fatigue, some brittle nails.  No hair or skin changes.  Occasional heart palpitations.  No chest pain or shortness of breath.  Depression and anxiety: She has struggled with depression for most of her life; it is made worse with chronic pain from fibromyalgia, weight gain, family stress. 4 daughters, married - does have good support with her husband; 18yo twins live at home full time, 58yo daughter in college at [employer] and is home frequently, and 21yo daughter in the [employer].  She denies SI/HI, no hallucinations, feels safe at home with her children.  She endorses fatigue, some crying spells, lack of motivation, insomnia.  She takes ambien, clonazepam, celexa.  We will refer to psychiatry and psychology today.  Advised that our practice is unable to prescribe benzodiazepine medications - pt voices understanding.  Fibromyalgia: She used to see Dr. Estanislado Pandy and Dr. Trudie Reed in the past but could not afford to go when she lost insurance several years ago. She is awaiting a rheumatology referral at this time - their contact information will be provided in AVS so that she may call as the referral was sent 10/01/2017.  She notes her pain has been particularly bad the last few weeks with recent weather changes- wrists, shoulders, hips, forearms are all painful.  Takes celexa, neurontin, prednisone 5mg , and voltaren daily.  She endorses significant fatigue.    Obesity: She has struggled with her weight her entire life and feels very motivated to lose weight because she has so much pain and feels this is  related to her weight gain.  We referred to medical weight management - on wait list for April 2019; she has started restricting carbohydrates and is cutting out sugars - she has lost 5lbs since last visit.  She is taking Meformin (see discussion below).  Lipid panel showed low HDL, otherwise is at goal - discussed mediterranean diet as a lifestyle modification option - will include low cardbohydrate/low sugar.  OSA: Using CPAP nightly and tolerates well.  She was diagnosed about 4-5 years ago, but had known she had it for 2+ years before then, but hadn't gone for sleep study until about 4-5 years ago.  She denies daytime sleepiness, but endorses ongoing chronic fatigue.   Prediabetes: A1C at last visit was 6.0%, we will increase her metformin from 1000mg  QHS to 1000mg  BID. She denies polyphagia, polydipsia, or polyuria.  She has been compliant with her medications and is agreeable to titrate up her Metformin.  She has been restricting carbohydrate/sugar intake.  PCOS: Has been taking Metformin for several years for PCOS - was prescribed by her GYN.  We will take over prescribing Metformin as this is now for prediabetes as well. She will maintain follow up for this issue. 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  She notes that she has trouble breathing out of the right nostril.  She  would like referral to ENT for further evaluation.  Otalgia: Recovering from URI in which she notes she experienced significant right ear pain; she is still having right ear irritation and would like this checked today.  GERD: Has never been officially diagnosed, takes prilosec OTC.  If she does not take prilosec she gets burning and "sour stomach"; no regurgitation.  Discussed risk of PPI's, not willing to switch today.  No blood in stools, no dark and tarry stools, no vomiting.  History of  spinal fusion and chronic back pain: She notes history of multiple spinal fusions.  She has ongoing headaches related to this chronic pain.  Denies extremity weakness; occasional tingling in RLE (better after her most recent surgery in 2006).  We will hold on referral to spine today as she would like to see rheumatology for her fibromyalgia first, but she will consider in the future.   Patient Active Problem List   Diagnosis Date Noted  . 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/01/2017  . Chronic midline back pain 10/01/2017  . Vitamin D deficiency 10/01/2017  . Anxiety 10/01/2017  . PCOS (polycystic ovarian syndrome) 10/01/2017  . Severe episode of recurrent major depressive disorder, without psychotic features ([place]) 10/01/2017  . BMI 50.0-59.9, adult ([place]) 10/01/2017  . Fibromyalgia 10/01/2017  . Postoperative hypothyroidism 10/01/2017  . OSA (obstructive sleep apnea) 02/26/2014    Past Surgical History:  Procedure Laterality Date  . ABDOMINAL HYSTERECTOMY    . CESAREAN SECTION    . CHOLECYSTECTOMY    . SPINAL FUSION    . THYROIDECTOMY    . TONSILLECTOMY    . TUBAL LIGATION      Family History  Problem Relation Age of Onset  . Cancer Maternal Uncle   . Heart disease Father   . Heart disease Paternal Grandfather   . Breast cancer Maternal Grandmother   . Rheum arthritis Mother   . Rheum arthritis Maternal Grandfather   . Rheum arthritis Maternal Aunt     Social History   Socioeconomic History  . Marital status: Married    Spouse name: Not on file  . Number of children: 4  . Years of education: Not on file  . Highest education level: Not on file  Social Needs  . Financial resource strain: Not on file  . Food insecurity - worry: Not on file  . Food insecurity - inability: Not on file  . Transportation needs - medical: Not on file  . Transportation needs - non-medical: Not on file  Occupational History  . Occupation: stay at home mother  Tobacco Use  . Smoking  status: Former Smoker    Packs/day: 0.25    Years: 12.00    Pack years: 3.00    Types: Cigarettes    Last attempt to quit: 09/07/2004    Years since quitting: 13.1  . Smokeless tobacco: Never Used  Substance and Sexual Activity  . Alcohol use: Yes    Comment: 4 times a year  . Drug use: No  . Sexual activity: Yes  Other Topics Concern  . Not on file  Social History Narrative  . Not on file     Current Outpatient Medications:  .  amoxicillin (AMOXIL) 500 MG tablet, Take 1 tablet (500 mg total) by mouth 2 (two) times daily for 10 days., Disp: 20 tablet, Rfl: 0 .  citalopram (CELEXA) 20 MG tablet, Take 20 mg by mouth daily.  , Disp: , Rfl:  .  clonazePAM (KLONOPIN) 0.5 MG tablet, Take 0.5  mg by mouth 3 (three) times daily as needed for anxiety., Disp: , Rfl:  .  diclofenac (VOLTAREN) 75 MG EC tablet, Take 75 mg by mouth 2 (two) times daily., Disp: , Rfl:  .  fluticasone (FLONASE) 50 MCG/ACT nasal spray, Place 2 sprays into both nostrils daily., Disp: 16 g, Rfl: 6 .  gabapentin (NEURONTIN) 100 MG capsule, Take 100 mg by mouth 3 (three) times daily., Disp: , Rfl:  .  loratadine (CLARITIN) 10 MG tablet, Take 1 tablet (10 mg total) by mouth daily., Disp: 30 tablet, Rfl: 0 .  metFORMIN (GLUCOPHAGE) 500 MG tablet, Take 1,000 mg by mouth daily., Disp: , Rfl:  .  omeprazole (PRILOSEC) 20 MG capsule, Take 20 mg by mouth daily., Disp: , Rfl:  .  predniSONE (DELTASONE) 5 MG tablet, Take 1 tablet (5 mg total) by mouth daily with breakfast., Disp: , Rfl:  .  thyroid (ARMOUR) 180 MG tablet, Take 1 tablet (180 mg total) by mouth daily. Except on Sundays - Take 1/2 tablet only., Disp: 45 tablet, Rfl: 0 .  VITAMIN D, CHOLECALCIFEROL, PO, Take 50 mcg by mouth 2 (two) times daily., Disp: , Rfl:  .  zolpidem (AMBIEN) 5 MG tablet, Take 10 mg by mouth at bedtime as needed. , Disp: , Rfl:   Allergies  Allergen Reactions  . Erythromycin   . Zyrtec [Cetirizine Hcl]     ROS Constitutional: Negative for  fever; positive weight loss.  Respiratory: Negative for cough and shortness of breath.   Cardiovascular: Negative for chest pain or palpitations.  Gastrointestinal: Negative for abdominal pain; endorses mild constipation.  Musculoskeletal: Negative for gait problem or joint swelling.  Skin: Negative for rash.  Neurological: Negative for dizziness; positive for occasional headaches.  No other specific complaints in a complete review of systems (except as listed in HPI above).  Objective  Vitals:   10/15/17 1325  BP: 128/78  Pulse: (!) 103  Resp: 18  Temp: 98.2 F (36.8 C)  TempSrc: Oral  SpO2: 93%  Weight: 289 lb 12.8 oz (131.5 kg)  Height: 5\' 1"  (1.549 m)   Body mass index is 54.76 kg/m.  Physical Exam Constitutional: Patient appears well-developed and well-nourished. Morbidly obese. No distress.  HENT: Head: Normocephalic and atraumatic.   Ears: LEFT TM good light reflex; RIGHT TM - unable to visualize TM. Eyes: Conjunctivae and EOM are normal. Pupils are equal, round, and reactive to light. No scleral icterus.  Neck: Normal range of motion. Neck supple. No JVD present. No thyromegaly present.  Cardiovascular: Normal rate, regular rhythm and normal heart sounds.  No murmur heard. No BLE edema. Pulmonary/Chest: Effort normal and breath sounds normal. No respiratory distress. Neurological: she is alert and oriented to person, place, and time. No cranial nerve deficit. Coordination, balance, strength, speech and gait are normal.  Skin: Skin is warm and dry. No rash noted. No erythema.  Psychiatric: Patient has a normal mood and affect. behavior is normal. Judgment and thought content normal.  No results found for this or any previous visit (from the past 72 hour(s)).  PHQ2/9: Depression screen PHQ 2/9 10/01/2017  Decreased Interest 3  Down, Depressed, Hopeless 3  PHQ - 2 Score 6  Altered sleeping 2  Tired, decreased energy 3  Change in appetite 2  Feeling bad or failure  about yourself  3  Trouble concentrating 3  Moving slowly or fidgety/restless 3  Suicidal thoughts 0  PHQ-9 Score 22  Difficult doing work/chores Extremely dIfficult  Fall Risk: Fall Risk  10/01/2017  Falls in the past year? No   Assessment & Plan  1. BMI 50.0-59.9, adult ([place]) - Lifestyle Modifications discussed including Mediterranean diet; she is on wait list for medcal weight management for April 2019.  2. PCOS (polycystic ovarian syndrome) - metFORMIN (GLUCOPHAGE) 500 MG tablet; Take 2 tablets (1,000 mg total) by mouth 2 (two) times daily with a meal.  Dispense: 360 tablet; Refill: 0  3. Postoperative hypothyroidism - We will recheck TSH 6 weeks from 10/01/2017; continue 180mg  Armor daily except on sundays take 1/2 tab (90mg ).  4. Prediabetes - Lifestyle modifications as above. - metFORMIN (GLUCOPHAGE) 500 MG tablet; Take 2 tablets (1,000 mg total) by mouth 2 (two) times daily with a meal.  Dispense: 360 tablet; Refill: 0  5. Nasal deformity - Ambulatory referral to ENT  6. Severe episode of recurrent major depressive disorder, without psychotic features [facility]) She is awaiting psychiatry referral - telephone message sent to [facility] to follow up on this referral.  Continue Celexa for the time being.  7. Anxiety She is awaiting psychiatry referral - telephone message sent to [facility] to follow up on this referral.  Continue Celexa for the time being.  8. Fibromyalgia Awaiting Rheumatology referral at this time.  9. History of subtotal thyroidectomy - We will recheck TSH 6 weeks from 10/01/2017; continue 180mg  Armor daily except on sundays take 1/2 tab (90mg ).  10. OSA (obstructive sleep apnea) Continue CPAP, work on weight loss  11. Gastroesophageal reflux disease without esophagitis - Avoid triggers, may continue omeprazole - discussed risk of long-term PPI use.  12. Chronic midline back pain, unspecified back location Stable  13. Otalgia, right ear - Ambulatory  referral to ENT  Return in about 2 months (around 12/13/2017) for Follow Up.

## 2017-10-17 NOTE — Telephone Encounter (Signed)
-   For the psychology referral, she is going to call herself and try to set something up to receive counseling. - I believe we still need to assist with the psychiatry referral.  Thank you!

## 2017-10-18 NOTE — Telephone Encounter (Signed)
I think [facility] provides counseling but not medication management - please confirm with patient.  If they do not provide medication management, she will need to call her insurance company to find a psychiatrist as well.

## 2017-10-18 NOTE — Telephone Encounter (Signed)
Left patient a message to discuss with [facility] about medication management. If they do not provide, she will need to call insurance company again.

## 2017-10-18 NOTE — Telephone Encounter (Signed)
[facility] contact her and she has an appointment on October 20, 2017

## 2017-10-18 NOTE — Telephone Encounter (Signed)
[name] - could you call patient and let her know that she needs to call her insurance company to determine who in the area they will cover for psychiatry.  Once she knows where she is able to go, she needs to call that psychiatry office and find out if she needs a referral from our clinic or if she can make an appointment herself.

## 2017-11-01 ENCOUNTER — Encounter: Payer: Medicaid Other | Admitting: Family Medicine

## 2017-11-09 ENCOUNTER — Other Ambulatory Visit: Payer: Self-pay | Admitting: Family Medicine

## 2017-11-09 DIAGNOSIS — E890 Postprocedural hypothyroidism: Secondary | ICD-10-CM

## 2017-11-09 MED ORDER — THYROID 180 MG PO TABS: 180.0000 mg | ORAL_TABLET | Freq: Every day | ORAL | 0 refills | Status: DC

## 2017-11-09 NOTE — Telephone Encounter (Signed)
Please call patient - she needs to come in for TSH recheck at the end of the week so that we may make any necessary adjustments to her thyroid medication. I have sent in 15 tablets of her Armour so that she has enough to get through until results are available.

## 2017-11-12 ENCOUNTER — Other Ambulatory Visit: Payer: Self-pay | Admitting: Family Medicine

## 2017-11-12 DIAGNOSIS — E890 Postprocedural hypothyroidism: Secondary | ICD-10-CM

## 2017-11-12 NOTE — Telephone Encounter (Addendum)
-----   Message from Hubbard Hartshorn, FNP sent at 10/04/2017  1:21 PM EST ----- Regarding: Recheck TSH Please call to remind Ms. Haggar that she needs to come in to have TSH rechecked next week.

## 2017-11-18 ENCOUNTER — Encounter (INDEPENDENT_AMBULATORY_CARE_PROVIDER_SITE_OTHER): Payer: Medicaid Other

## 2017-11-22 ENCOUNTER — Encounter: Payer: Self-pay | Admitting: Family Medicine

## 2017-11-29 ENCOUNTER — Other Ambulatory Visit: Payer: Self-pay | Admitting: Family Medicine

## 2017-11-29 ENCOUNTER — Telehealth: Payer: Self-pay | Admitting: Family Medicine

## 2017-11-29 DIAGNOSIS — H6501 Acute serous otitis media, right ear: Secondary | ICD-10-CM

## 2017-11-29 DIAGNOSIS — E890 Postprocedural hypothyroidism: Secondary | ICD-10-CM

## 2017-11-29 NOTE — Telephone Encounter (Signed)
Copied from [place] [id number]. Topic: Quick Communication - Rx Refill/Question >> Nov 29, 2017  9:41 AM Antonieta Iba C wrote:   Medication: thyroid (ARMOUR) 180 MG tablet  Has the patient contacted their pharmacy? Yes  (Agent: If no, request that the patient contact the pharmacy for the refill.)  Preferred Pharmacy (with phone number or street name): [facility], [place] [facility] [place] [phone] (Phone) [phone] (Fax)     Agent: Please be advised that RX refills may take up to 3 business days. We ask that you follow-up with your pharmacy.

## 2017-11-29 NOTE — Telephone Encounter (Signed)
Please make patient appointment to be seen

## 2017-11-29 NOTE — Telephone Encounter (Signed)
This was given for an acute visit - does she need to be taking this daily for allergies?  Also please advise she needs to come back in for TSH recheck - she was supposed to have this done around 11/15/2017 but has not yet come in.

## 2017-11-29 NOTE — Telephone Encounter (Signed)
Armour refill request  [place], [place].  Pt of Raelyn Ensign

## 2017-11-30 MED ORDER — THYROID 180 MG PO TABS: 180.0000 mg | ORAL_TABLET | Freq: Every day | ORAL | 0 refills | Status: DC

## 2017-11-30 NOTE — Telephone Encounter (Signed)
I provided 15 day supply for pt to come in for lab draw of TSH.  I will provide 3 day supply to allow her to come in this week to have this done.    **She does not need an appointment with me on 12/02/2017 as she is already scheduled on 12/13/2017 unless she needs to see me sooner.**

## 2017-11-30 NOTE — Telephone Encounter (Signed)
Pt does not need an appointment, she just needs to come in and have her TSH redrawn.  She can keep her April appointment with me.  I have sent in 3 tablets to get her through this week until we have her TSH level.

## 2017-11-30 NOTE — Telephone Encounter (Signed)
Caller name: Harmon Pier Relation to pt: [place] Call back number: [phone]    Reason for call:  Pharmacy requesting 6 tablets of thyroid (ARMOUR) 180 MG to hold her over until 12/02/17 appointment, please advise

## 2017-12-01 NOTE — Telephone Encounter (Signed)
Left message for patient to stop by for labs

## 2017-12-01 NOTE — Telephone Encounter (Signed)
She just needs to get the TSH drawn ASAP; does not need to be fasting Thank you

## 2017-12-01 NOTE — Telephone Encounter (Signed)
I have lab order from January that she can use. Do she need to see you for refills

## 2017-12-01 NOTE — Telephone Encounter (Signed)
Can you please advise. Do you want to give a TSH order and then refills

## 2017-12-02 ENCOUNTER — Ambulatory Visit: Payer: Medicaid Other | Admitting: Family Medicine

## 2017-12-03 ENCOUNTER — Other Ambulatory Visit: Payer: Self-pay | Admitting: Family Medicine

## 2017-12-03 DIAGNOSIS — H6501 Acute serous otitis media, right ear: Secondary | ICD-10-CM

## 2017-12-03 NOTE — Telephone Encounter (Signed)
Refill request for general medication: Claritin 10 mg  Last office visit: 10/15/2017  Last physical exam: None indicated  Follow-ups on file. None indicated

## 2017-12-06 MED ORDER — LORATADINE 10 MG PO TABS: 10.0000 mg | ORAL_TABLET | Freq: Every day | ORAL | 0 refills | Status: DC

## 2017-12-07 ENCOUNTER — Ambulatory Visit (INDEPENDENT_AMBULATORY_CARE_PROVIDER_SITE_OTHER): Payer: Medicaid Other | Admitting: Family Medicine

## 2017-12-07 ENCOUNTER — Encounter (INDEPENDENT_AMBULATORY_CARE_PROVIDER_SITE_OTHER): Payer: Self-pay | Admitting: Family Medicine

## 2017-12-07 ENCOUNTER — Encounter: Payer: Self-pay | Admitting: Family Medicine

## 2017-12-07 VITALS — BP 117/81 | HR 89 | Temp 97.5°F | Ht 61.0 in | Wt 292.0 lb

## 2017-12-07 DIAGNOSIS — E038 Other specified hypothyroidism: Secondary | ICD-10-CM | POA: Insufficient documentation

## 2017-12-07 DIAGNOSIS — R7303 Prediabetes: Secondary | ICD-10-CM

## 2017-12-07 DIAGNOSIS — Z6841 Body Mass Index (BMI) 40.0 and over, adult: Secondary | ICD-10-CM | POA: Diagnosis not present

## 2017-12-07 DIAGNOSIS — R5383 Other fatigue: Secondary | ICD-10-CM | POA: Diagnosis not present

## 2017-12-07 DIAGNOSIS — R0602 Shortness of breath: Secondary | ICD-10-CM | POA: Insufficient documentation

## 2017-12-07 DIAGNOSIS — Z1331 Encounter for screening for depression: Secondary | ICD-10-CM | POA: Diagnosis not present

## 2017-12-07 DIAGNOSIS — E559 Vitamin D deficiency, unspecified: Secondary | ICD-10-CM | POA: Diagnosis not present

## 2017-12-07 DIAGNOSIS — Z0289 Encounter for other administrative examinations: Secondary | ICD-10-CM

## 2017-12-07 NOTE — Progress Notes (Signed)
Office: [phone]  /  Fax: [phone]   Dear [name], [place],   Thank you for referring [name] to our clinic. The following note includes my evaluation and treatment recommendations.  HPI:   Chief Complaint: OBESITY    [name] has been referred by Astrid Divine. [name], FNP for consultation regarding her obesity and obesity related comorbidities.    DASHAWN Mejia (MR# [id number]) is a 42 y.o. female who presents on 12/07/2017 for obesity evaluation and treatment. Current BMI is Body mass index is 55.17 kg/m.[name] has been struggling with her weight for many years and has been unsuccessful in either losing weight, maintaining weight loss, or reaching her healthy weight goal.     [name] attended our information session and states she is currently in the action stage of change and ready to dedicate time achieving and maintaining a healthier weight. [name] is interested in becoming our patient and working on intensive lifestyle modifications including (but not limited to) diet, exercise and weight loss.    [name] states her family eats meals together she thinks her family will eat healthier with  her she struggles with family and or coworkers weight loss sabotage her desired weight loss is 162 lbs she started gaining weight after thyroid, spinal, and gallbladder surgeries her heaviest weight ever was 292 lbs she has significant food cravings issues  she wakes up frquently in the middle of the night to eat she skips meals frequently she is frequently drinking liquids with calories she frequently makes poor food choices she frequently eats larger portions than normal  she struggles with emotional eating    [name] feels her energy is lower than it should be. This has worsened with weight gain and has not worsened recently. [name] admits to daytime somnolence and  admits to waking up still tired. Patient has a history of obstructive sleep apnea with the use of CPAP. Patent  has a history of symptoms of daytime fatigue. Patient generally gets 12 hours of sleep per night, and states they generally have generally restful sleep. Snoring is present. Apneic episodes are present. Epworth Sleepiness Score is 10.  Dyspnea on exertion Aeon notes increasing shortness of breath with exercising and seems to be worsening over time with weight gain. She notes getting out of breath sooner with activity than she used to. This has not gotten worse recently. Hailley denies orthopnea.  Vitamin D Deficiency [name] has a diagnosis of vitamin D deficiency. She is on OTC Vit D, she notes fatigue and denies nausea, vomiting or muscle weakness.  Pre-Diabetes [name] has a diagnosis of pre-diabetes based on her elevated Hgb A1c and was informed this puts her at greater risk of developing diabetes. Last A1c at 6.0 on metformin qhs only. She notes polyphagia and eats a lot of simple carbohydrates daily. She continues to work on diet and exercise to decrease risk of diabetes. She denies nausea or hypoglycemia.  Hypothyroid [name] has a diagnosis of hypothyroidism. She is on armour thyroid. Last TSH low and she notes fatigue and palpitations occasionally. She denies hot or cold intolerance.  Depression Screen [name]'s Food and Mood (modified PHQ-9) score was  Depression screen PHQ 2/9 12/07/2017  Decreased Interest 3  Down, Depressed, Hopeless 3  PHQ - 2 Score 6  Altered sleeping 3  Tired, decreased energy 3  Change in appetite 3  Feeling bad or failure about yourself  3  Trouble concentrating 2  Moving slowly or fidgety/restless 3  Suicidal thoughts 2  PHQ-9 Score 25  Difficult doing work/chores Extremely dIfficult    ALLERGIES: Allergies  Allergen Reactions  . Erythromycin   . Zyrtec [Cetirizine Hcl]     MEDICATIONS: Current Outpatient Medications on File Prior to Visit  Medication Sig Dispense Refill  . acetaminophen (TYLENOL) 325 MG tablet Take 650 mg by mouth every 6 (six)  hours as needed.    . citalopram (CELEXA) 20 MG tablet Take 20 mg by mouth daily.      . clonazePAM (KLONOPIN) 0.5 MG tablet Take 0.5 mg by mouth 3 (three) times daily as needed for anxiety.    . diclofenac (VOLTAREN) 75 MG EC tablet Take 75 mg by mouth 2 (two) times daily.    . fluticasone (FLONASE) 50 MCG/ACT nasal spray Place 2 sprays into both nostrils daily. 16 g 6  . gabapentin (NEURONTIN) 100 MG capsule Take 100 mg by mouth 3 (three) times daily.    Marissa Kitchen ibuprofen (ADVIL,MOTRIN) 200 MG tablet Take 200 mg by mouth every 6 (six) hours as needed.    . metFORMIN (GLUCOPHAGE) 500 MG tablet Take 2 tablets (1,000 mg total) by mouth 2 (two) times daily with a meal. 360 tablet 0  . omeprazole (PRILOSEC) 20 MG capsule Take 20 mg by mouth daily.    . predniSONE (DELTASONE) 5 MG tablet Take 1 tablet (5 mg total) by mouth daily with breakfast.    . thyroid (ARMOUR) 180 MG tablet Take 1 tablet (180 mg total) by mouth daily. Except on Sundays - Take 1/2 tablet only. 3 tablet 0  . VITAMIN D, CHOLECALCIFEROL, PO Take 50 mcg by mouth 2 (two) times daily.    Marissa Kitchen zolpidem (AMBIEN) 5 MG tablet Take 10 mg by mouth at bedtime as needed.      No current facility-administered medications on file prior to visit.     PAST MEDICAL HISTORY: Past Medical History:  Diagnosis Date  . Asthma   . Chronic fatigue   . Chronic pain    back and hips  . Fibromyalgia   . High cholesterol   . Insomnia   . Prediabetes   . Scoliosis   . Sleep apnea     PAST SURGICAL HISTORY: Past Surgical History:  Procedure Laterality Date  . ABDOMINAL HYSTERECTOMY    . CESAREAN SECTION    . CHOLECYSTECTOMY    . SPINAL FUSION    . THYROIDECTOMY    . TONSILLECTOMY    . TUBAL LIGATION      SOCIAL HISTORY: Social History   Tobacco Use  . Smoking status: Former Smoker    Packs/day: 0.25    Years: 12.00    Pack years: 3.00    Types: Cigarettes    Last attempt to quit: 09/07/2004    Years since quitting: 13.2  . Smokeless  tobacco: Never Used  Substance Use Topics  . Alcohol use: Yes    Comment: 4 times a year  . Drug use: No    FAMILY HISTORY: Family History  Problem Relation Age of Onset  . Cancer Maternal Uncle   . Heart disease Father   . High Cholesterol Father   . Heart disease Paternal Grandfather   . Breast cancer Maternal Grandmother   . Rheum arthritis Mother   . Obesity Mother   . Rheum arthritis Maternal Grandfather   . Rheum arthritis Maternal Aunt     ROS: Review of Systems  Constitutional: Positive for malaise/fatigue. Negative for weight loss.  Negative hot/cold intolerance + Trouble sleeping  HENT: Positive for sinus pain.        + Nasal stuffiness + Dry mouth  Eyes:       + Wear glasses or contacts  Respiratory: Positive for shortness of breath (with exertion).   Cardiovascular: Positive for palpitations. Negative for orthopnea.       + Very cold feet and hands  Gastrointestinal: Negative for nausea and vomiting.  Musculoskeletal: Positive for back pain.       Negative muscle weakness + Neck stiffness + Muscle or joint pain + Muscle stiffness  Skin: Positive for itching.       + Dryness  Neurological: Positive for weakness and headaches.  Endo/Heme/Allergies: Positive for polydipsia. Bruises/bleeds easily.       Positive polyphagia Negative hypoglycemia  Psychiatric/Behavioral: Positive for depression. Negative for suicidal ideas. The patient is nervous/anxious.        + Stress    PHYSICAL EXAM: Blood pressure 117/81, pulse 89, temperature (!) 97.5 F (36.4 C), temperature source Oral, height 5\' 1"  (1.549 m), weight 292 lb (132.5 kg), SpO2 96 %. Body mass index is 55.17 kg/m. Physical Exam  Constitutional: She is oriented to person, place, and time. She appears well-developed and well-nourished.  HENT:  Head: Normocephalic and atraumatic.  Nose: Nose normal.  Eyes: EOM are normal. No scleral icterus.  Neck: Normal range of motion. Neck supple. No  thyromegaly present.  Cardiovascular: Normal rate and regular rhythm.  Pulmonary/Chest: Effort normal. No respiratory distress.  Abdominal: Soft. There is no tenderness.  + Obesity  Musculoskeletal:  Range of Motion normal in all 4 extremities Trace edema noted in bilateral lower extremities  Neurological: She is oriented to person, place, and time.  Skin: Skin is warm and dry.  Psychiatric: She has a normal mood and affect. Her behavior is normal.  Vitals reviewed.   RECENT LABS AND TESTS: BMET    Component Value Date/Time   NA 139 10/01/2017 1041   K 4.2 10/01/2017 1041   CL 105 10/01/2017 1041   CO2 25 10/01/2017 1041   GLUCOSE 90 10/01/2017 1041   BUN 16 10/01/2017 1041   CREATININE 0.53 10/01/2017 1041   CALCIUM 9.4 10/01/2017 1041   GFRNONAA 116 10/01/2017 1041   GFRAA 135 10/01/2017 1041   Lab Results  Component Value Date   HGBA1C 6.0 (H) 10/01/2017   No results found for: INSULIN CBC    Component Value Date/Time   WBC 7.0 10/01/2017 1041   RBC 5.02 10/01/2017 1041   HGB 14.2 10/01/2017 1041   HCT 41.2 10/01/2017 1041   PLT 244 10/01/2017 1041   MCV 82.1 10/01/2017 1041   MCH 28.3 10/01/2017 1041   MCHC 34.5 10/01/2017 1041   RDW 12.5 10/01/2017 1041   LYMPHSABS 2,331 10/01/2017 1041   EOSABS 140 10/01/2017 1041   BASOSABS 42 10/01/2017 1041   Iron/TIBC/Ferritin/ %Sat No results found for: IRON, TIBC, FERRITIN, IRONPCTSAT Lipid Panel     Component Value Date/Time   CHOL 132 10/08/2017 1103   TRIG 87 10/08/2017 1103   HDL 46 (L) 10/08/2017 1103   CHOLHDL 2.9 10/08/2017 1103   LDLCALC 69 10/08/2017 1103   Hepatic Function Panel     Component Value Date/Time   PROT 7.2 10/01/2017 1041   AST 18 10/01/2017 1041   ALT 30 (H) 10/01/2017 1041   BILITOT 0.3 10/01/2017 1041      Component Value Date/Time   TSH 0.01 (L) 10/01/2017 1041  ECG  shows NSR with a rate of 78 BPM INDIRECT CALORIMETER done today shows a VO2 of 356 and a REE of 2481.   Her calculated basal metabolic rate is 1610 thus her basal metabolic rate is better than expected.    ASSESSMENT AND PLAN: Other fatigue - Plan: EKG 12-Lead  Shortness of breath on exertion  Vitamin D deficiency  Prediabetes  Other specified hypothyroidism  Depression screening  Class 3 severe obesity with serious comorbidity and body mass index (BMI) of 50.0 to 59.9 in adult, unspecified obesity type ([place])  PLAN:  Fatigue Emmajean was informed that her fatigue may be related to obesity, depression or many other causes. Labs will be ordered, and in the meanwhile Shaianne has agreed to work on diet, exercise and weight loss to help with fatigue. Proper sleep hygiene was discussed including the need for 7-8 hours of quality sleep each night. A sleep study was not ordered based on symptoms and Epworth score.  Dyspnea on exertion Clotile's shortness of breath appears to be obesity related and exercise induced. She has agreed to work on weight loss and gradually increase exercise to treat her exercise induced shortness of breath. If Maureena follows our instructions and loses weight without improvement of her shortness of breath, we will plan to refer to pulmonology. We will monitor this condition regularly. Paisleigh agrees to this plan.  Vitamin D Deficiency Lexiana was informed that low vitamin D levels contributes to fatigue and are associated with obesity, breast, and colon cancer. Karey agrees to continue taking OTC Vit D will follow up for routine testing of vitamin D, at least 2-3 times per year. She was informed of the risk of over-replacement of vitamin D and agrees to not increase her dose unless she discusses this with [place] first. We will check labs and Ovida agrees to follow up with our clinic in 2 weeks.  Pre-Diabetes Cerina will continue to work on weight loss, diet, exercise, and decreasing simple carbohydrates in her diet to help decrease the risk of diabetes. We dicussed metformin including  benefits and risks. She was informed that eating too many simple carbohydrates or too many calories at one sitting increases the likelihood of GI side effects. Lydia agrees to continue taking metformin as prescribed. We will check labs and Wava agrees to follow up with our clinic in 2 weeks as directed to monitor her progress.  Hypothyroid Verlee was informed of the importance of good thyroid control to help with weight loss efforts. She was also informed that supertheraputic thyroid levels are dangerous and will not improve weight loss results. We will check labs and Cherelle agrees to follow up with our clinic in 2 weeks.  Depression Screen Kadisha had a strongly positive depression screening. Depression is commonly associated with obesity and often results in emotional eating behaviors. We will monitor this closely and work on CBT to help improve the non-hunger eating patterns. Referral to Psychology may be required if no improvement is seen as she continues in our clinic.  Obesity Deanie is currently in the action stage of change and her goal is to continue with weight loss efforts. I recommend Malayja begin the structured treatment plan as follows:  She has agreed to follow the Category 3 plan + 100 calories Brianni has been instructed to eventually work up to a goal of 150 minutes of combined cardio and strengthening exercise per week for weight loss and overall health benefits. We discussed the following Behavioral Modification Strategies today: increasing  lean protein intake, decreasing simple carbohydrates, and no skipping meals    She was informed of the importance of frequent follow up visits to maximize her success with intensive lifestyle modifications for her multiple health conditions. She was informed we would discuss her lab results at her next visit unless there is a critical issue that needs to be addressed sooner. Tynleigh agreed to keep her next visit at the agreed upon time to discuss these  results.    OBESITY BEHAVIORAL INTERVENTION VISIT  Today's visit was # 1 out of 22.  Starting weight: 292 lbs Starting date: 12/07/17 Today's weight : 292 lbs Today's date: 12/07/2017 Total lbs lost to date: 0 (Patients must lose 7 lbs in the first 6 months to continue with counseling)   ASK: We discussed the diagnosis of obesity with [name] today and Correen agreed to give [place] permission to discuss obesity behavioral modification therapy today.  ASSESS: Patricie has the diagnosis of obesity and her BMI today is 41.2 Lynnlee is in the action stage of change   ADVISE: Veeda was educated on the multiple health risks of obesity as well as the benefit of weight loss to improve her health. She was advised of the need for long term treatment and the importance of lifestyle modifications.  AGREE: Multiple dietary modification options and treatment options were discussed and  Katoria agreed to the above obesity treatment plan.   I, Trixie Dredge, am acting as transcriptionist for Dennard Nip, MD  I have reviewed the above documentation for accuracy and completeness, and I agree with the above. -Dennard Nip, MD

## 2017-12-08 LAB — COMPREHENSIVE METABOLIC PANEL
ALBUMIN: 4.3 g/dL (ref 3.5–5.5)
ALK PHOS: 82 IU/L (ref 39–117)
ALT: 43 IU/L — ABNORMAL HIGH (ref 0–32)
AST: 26 IU/L (ref 0–40)
Albumin/Globulin Ratio: 1.4 (ref 1.2–2.2)
BUN/Creatinine Ratio: 16 (ref 9–23)
BUN: 10 mg/dL (ref 6–24)
Bilirubin Total: 0.3 mg/dL (ref 0.0–1.2)
CO2: 23 mmol/L (ref 20–29)
CREATININE: 0.61 mg/dL (ref 0.57–1.00)
Calcium: 9.7 mg/dL (ref 8.7–10.2)
Chloride: 101 mmol/L (ref 96–106)
GFR calc Af Amer: 128 mL/min/{1.73_m2} (ref >59)
GFR calc non Af Amer: 111 mL/min/{1.73_m2} (ref >59)
GLUCOSE: 87 mg/dL (ref 65–99)
Globulin, Total: 3.1 g/dL (ref 1.5–4.5)
Potassium: 4.1 mmol/L (ref 3.5–5.2)
Sodium: 142 mmol/L (ref 134–144)
Total Protein: 7.4 g/dL (ref 6.0–8.5)

## 2017-12-08 LAB — LIPID PANEL WITH LDL/HDL RATIO
CHOLESTEROL TOTAL: 148 mg/dL (ref 100–199)
HDL: 53 mg/dL (ref >39)
LDL CALC: 67 mg/dL (ref 0–99)
LDl/HDL Ratio: 1.3 ratio (ref 0.0–3.2)
Triglycerides: 138 mg/dL (ref 0–149)
VLDL Cholesterol Cal: 28 mg/dL (ref 5–40)

## 2017-12-08 LAB — CBC WITH DIFFERENTIAL
Basophils Absolute: 0 10*3/uL (ref 0.0–0.2)
Basos: 0 %
EOS (ABSOLUTE): 0.2 10*3/uL (ref 0.0–0.4)
Eos: 2 %
HEMOGLOBIN: 14.3 g/dL (ref 11.1–15.9)
Hematocrit: 42.6 % (ref 34.0–46.6)
IMMATURE GRANULOCYTES: 0 %
Immature Grans (Abs): 0 10*3/uL (ref 0.0–0.1)
LYMPHS ABS: 2.3 10*3/uL (ref 0.7–3.1)
Lymphs: 33 %
MCH: 28.3 pg (ref 26.6–33.0)
MCHC: 33.6 g/dL (ref 31.5–35.7)
MCV: 84 fL (ref 79–97)
MONOCYTES: 9 %
Monocytes Absolute: 0.6 10*3/uL (ref 0.1–0.9)
Neutrophils Absolute: 3.9 10*3/uL (ref 1.4–7.0)
Neutrophils: 56 %
RBC: 5.06 x10E6/uL (ref 3.77–5.28)
RDW: 13.3 % (ref 12.3–15.4)
WBC: 7.1 10*3/uL (ref 3.4–10.8)

## 2017-12-08 LAB — FOLATE: FOLATE: 14.1 ng/mL (ref >3.0)

## 2017-12-08 LAB — HEMOGLOBIN A1C
Est. average glucose Bld gHb Est-mCnc: 120 mg/dL
HEMOGLOBIN A1C: 5.8 % — AB (ref 4.8–5.6)

## 2017-12-08 LAB — INSULIN, RANDOM: INSULIN: 20.1 u[IU]/mL (ref 2.6–24.9)

## 2017-12-08 LAB — T3: T3, Total: 193 ng/dL — ABNORMAL HIGH (ref 71–180)

## 2017-12-08 LAB — VITAMIN B12: VITAMIN B 12: 557 pg/mL (ref 232–1245)

## 2017-12-08 LAB — T4, FREE: FREE T4: 1.33 ng/dL (ref 0.82–1.77)

## 2017-12-08 LAB — VITAMIN D 25 HYDROXY (VIT D DEFICIENCY, FRACTURES): Vit D, 25-Hydroxy: 50.4 ng/mL (ref 30.0–100.0)

## 2017-12-08 LAB — TSH: TSH: 0.009 u[IU]/mL — ABNORMAL LOW (ref 0.450–4.500)

## 2017-12-12 ENCOUNTER — Encounter (INDEPENDENT_AMBULATORY_CARE_PROVIDER_SITE_OTHER): Payer: Self-pay | Admitting: Family Medicine

## 2017-12-13 ENCOUNTER — Ambulatory Visit: Payer: Medicaid Other | Admitting: Family Medicine

## 2017-12-16 ENCOUNTER — Encounter: Payer: Self-pay | Admitting: Family Medicine

## 2017-12-21 NOTE — Telephone Encounter (Signed)
FYI

## 2017-12-21 NOTE — Telephone Encounter (Signed)
Her last T3 was high and TSH is much too low I agree with dropping her Armour to 120 and rechecking TSH, T3 in 6 weeks

## 2017-12-21 NOTE — Telephone Encounter (Signed)
Copied from [place] [id number]. Topic: Quick Communication - See Telephone Encounter >> Dec 21, 2017  3:01 PM Percell Belt A wrote: CRM for notification. See Telephone encounter for: 12/21/17. Pt called stated that Hyla was slowly decreasing her thyroid meds.  She is in need of a refill not and was told she did not have to come back and repeat the tsh.  She knows Nyna is out and would like to know what she needs to do?    She would like a nurse to call her  Best number- [phone]

## 2017-12-22 ENCOUNTER — Ambulatory Visit (INDEPENDENT_AMBULATORY_CARE_PROVIDER_SITE_OTHER): Payer: Medicaid Other | Admitting: Family Medicine

## 2017-12-22 ENCOUNTER — Telehealth: Payer: Self-pay

## 2017-12-22 ENCOUNTER — Other Ambulatory Visit: Payer: Self-pay | Admitting: Nurse Practitioner

## 2017-12-22 VITALS — BP 123/81 | HR 73 | Temp 97.6°F | Ht 61.0 in | Wt 288.0 lb

## 2017-12-22 DIAGNOSIS — Z9189 Other specified personal risk factors, not elsewhere classified: Secondary | ICD-10-CM | POA: Diagnosis not present

## 2017-12-22 DIAGNOSIS — Z6841 Body Mass Index (BMI) 40.0 and over, adult: Secondary | ICD-10-CM

## 2017-12-22 DIAGNOSIS — K760 Fatty (change of) liver, not elsewhere classified: Secondary | ICD-10-CM

## 2017-12-22 DIAGNOSIS — E038 Other specified hypothyroidism: Secondary | ICD-10-CM

## 2017-12-22 DIAGNOSIS — R7303 Prediabetes: Secondary | ICD-10-CM

## 2017-12-22 MED ORDER — THYROID 120 MG PO TABS: 120.0000 mg | ORAL_TABLET | Freq: Every day | ORAL | 0 refills | Status: DC

## 2017-12-22 NOTE — Progress Notes (Signed)
Will reduce Armour to 120mg  daily and recheck TSH and T3 in 6 weeks  Lab Results  Component Value Date   TSH 0.009 (L) 12/07/2017   .

## 2017-12-22 NOTE — Telephone Encounter (Signed)
Marissa Courser, MD at 12/21/2017 6:09 PM   Status: Signed    Her last T3 was high and TSH is much too low I agree with dropping her Armour to 120 and rechecking TSH, T3 in 6 weeks    [name], [name] at 12/21/2017 3:12 PM   Status: Signed    [name] at 12/21/2017 3:03 PM   Status: Signed    Copied from [place] [id number]. Topic: Quick Communication - See Telephone Encounter >> Dec 21, 2017  3:01 PM [name] wrote: CRM for notification. See Telephone encounter for: 12/21/17. Pt called stated that [name] was slowly decreasing her thyroid meds.  She is in need of Mejia refill not and was told she did not have to come back and repeat the tsh.  She knows [name] is out and would like to know what she needs to do?    She would like Mejia nurse to call her  Best number- [phone]      Called pt no answer LM for pt informing her of the need to come in to the office in 6wks for repeat labs. Also advised pt on armour 120mg . CRM created.

## 2017-12-23 ENCOUNTER — Encounter (INDEPENDENT_AMBULATORY_CARE_PROVIDER_SITE_OTHER): Payer: Self-pay | Admitting: Family Medicine

## 2017-12-23 NOTE — Progress Notes (Signed)
Office: [phone]  /  Fax: [phone]   HPI:   Chief Complaint: OBESITY [name] is here to discuss her progress with her obesity treatment plan. She is on the Category 3 plan + 100 calories and is following her eating plan approximately 90 % of the time. She states she is exercising 0 minutes 0 times per week. [name] struggled to eat all her protein on Category 3 plan but she worked hard to.  Her weight is 288 lb (130.6 kg) today and has had a weight loss of 4 pounds over a period of 2 weeks since her last visit. She has lost 4 lbs since starting treatment with [place].  Pre-Diabetes [name] has a diagnosis of pre-diabetes based on her elevated Hgb A1c and was informed this puts her at greater risk of developing diabetes. She has been on metformin for years, A1c at 5.8, notes polyphagia before starting her Category 3 plan, not now. She did well with prescribed diet and weight loss. She denies nausea or hypoglycemia.  At risk for diabetes [name] is at higher than average risk for developing diabetes due to her obesity and pre-diabetes. She currently denies polyuria or polydipsia.  Non Alcoholic Fatty Liver Disease [name] has a diagnosis of mildly elevated ALT. Her BMI is over 40. She denies abdominal pain or jaundice and has never been told of any liver problems in the past. She denies excessive alcohol intake.  ALLERGIES: Allergies  Allergen Reactions  . Erythromycin   . Zyrtec [Cetirizine Hcl]     MEDICATIONS: Current Outpatient Medications on File Prior to Visit  Medication Sig Dispense Refill  . acetaminophen (TYLENOL) 325 MG tablet Take 650 mg by mouth every 6 (six) hours as needed.    . citalopram (CELEXA) 20 MG tablet Take 20 mg by mouth daily.      . clonazePAM (KLONOPIN) 0.5 MG tablet Take 0.5 mg by mouth 3 (three) times daily as needed for anxiety.    . diclofenac (VOLTAREN) 75 MG EC tablet Take 75 mg by mouth 2 (two) times daily.    . fluticasone (FLONASE) 50 MCG/ACT nasal  spray Place 2 sprays into both nostrils daily. 16 g 6  . gabapentin (NEURONTIN) 100 MG capsule Take 100 mg by mouth 3 (three) times daily.    [name] Kitchen ibuprofen (ADVIL,MOTRIN) 200 MG tablet Take 200 mg by mouth every 6 (six) hours as needed.    . metFORMIN (GLUCOPHAGE) 500 MG tablet Take 2 tablets (1,000 mg total) by mouth 2 (two) times daily with a meal. 360 tablet 0  . omeprazole (PRILOSEC) 20 MG capsule Take 20 mg by mouth daily.    . predniSONE (DELTASONE) 5 MG tablet Take 1 tablet (5 mg total) by mouth daily with breakfast.    . VITAMIN D, CHOLECALCIFEROL, PO Take 50 mcg by mouth 2 (two) times daily.    [name] Kitchen zolpidem (AMBIEN) 5 MG tablet Take 10 mg by mouth at bedtime as needed.      No current facility-administered medications on file prior to visit.     PAST MEDICAL HISTORY: Past Medical History:  Diagnosis Date  . Asthma   . Chronic fatigue   . Chronic pain    back and hips  . Fibromyalgia   . High cholesterol   . Insomnia   . Prediabetes   . Scoliosis   . Sleep apnea     PAST SURGICAL HISTORY: Past Surgical History:  Procedure Laterality Date  . ABDOMINAL HYSTERECTOMY    . CESAREAN SECTION    .  CHOLECYSTECTOMY    . SPINAL FUSION    . THYROIDECTOMY    . TONSILLECTOMY    . TUBAL LIGATION      SOCIAL HISTORY: Social History   Tobacco Use  . Smoking status: Former Smoker    Packs/day: 0.25    Years: 12.00    Pack years: 3.00    Types: Cigarettes    Last attempt to quit: 09/07/2004    Years since quitting: 13.3  . Smokeless tobacco: Never Used  Substance Use Topics  . Alcohol use: Yes    Comment: 4 times a year  . Drug use: No    FAMILY HISTORY: Family History  Problem Relation Age of Onset  . Cancer Maternal Uncle   . Heart disease Father   . High Cholesterol Father   . Heart disease Paternal Grandfather   . Breast cancer Maternal Grandmother   . Rheum arthritis Mother   . Obesity Mother   . Rheum arthritis Maternal Grandfather   . Rheum arthritis Maternal  Aunt     ROS: Review of Systems  Constitutional: Positive for weight loss.  Eyes:       Negative jaundice  Gastrointestinal: Negative for abdominal pain.  Genitourinary: Negative for frequency.  Endo/Heme/Allergies: Negative for polydipsia.       Negative polyphagia Negative hypoglycemia    PHYSICAL EXAM: Blood pressure 123/81, pulse 73, temperature 97.6 F (36.4 C), temperature source Oral, height 5\' 1"  (1.549 m), weight 288 lb (130.6 kg), SpO2 97 %. Body mass index is 54.42 kg/m. Physical Exam  Constitutional: She is oriented to person, place, and time. She appears well-developed and well-nourished.  Cardiovascular: Normal rate.  Pulmonary/Chest: Effort normal.  Musculoskeletal: Normal range of motion.  Neurological: She is oriented to person, place, and time.  Skin: Skin is warm and dry.  Psychiatric: She has a normal mood and affect. Her behavior is normal.  Vitals reviewed.   RECENT LABS AND TESTS: BMET    Component Value Date/Time   NA 142 12/07/2017 1204   K 4.1 12/07/2017 1204   CL 101 12/07/2017 1204   CO2 23 12/07/2017 1204   GLUCOSE 87 12/07/2017 1204   GLUCOSE 90 10/01/2017 1041   BUN 10 12/07/2017 1204   CREATININE 0.61 12/07/2017 1204   CREATININE 0.53 10/01/2017 1041   CALCIUM 9.7 12/07/2017 1204   GFRNONAA 111 12/07/2017 1204   GFRNONAA 116 10/01/2017 1041   GFRAA 128 12/07/2017 1204   GFRAA 135 10/01/2017 1041   Lab Results  Component Value Date   HGBA1C 5.8 (H) 12/07/2017   HGBA1C 6.0 (H) 10/01/2017   Lab Results  Component Value Date   INSULIN 20.1 12/07/2017   CBC    Component Value Date/Time   WBC 7.1 12/07/2017 1204   WBC 7.0 10/01/2017 1041   RBC 5.06 12/07/2017 1204   RBC 5.02 10/01/2017 1041   HGB 14.3 12/07/2017 1204   HCT 42.6 12/07/2017 1204   PLT 244 10/01/2017 1041   MCV 84 12/07/2017 1204   MCH 28.3 12/07/2017 1204   MCH 28.3 10/01/2017 1041   MCHC 33.6 12/07/2017 1204   MCHC 34.5 10/01/2017 1041   RDW 13.3  12/07/2017 1204   LYMPHSABS 2.3 12/07/2017 1204   EOSABS 0.2 12/07/2017 1204   BASOSABS 0.0 12/07/2017 1204   Iron/TIBC/Ferritin/ %Sat No results found for: IRON, TIBC, FERRITIN, IRONPCTSAT Lipid Panel     Component Value Date/Time   CHOL 148 12/07/2017 1204   TRIG 138 12/07/2017 1204   HDL 53  12/07/2017 1204   CHOLHDL 2.9 10/08/2017 1103   LDLCALC 67 12/07/2017 1204   LDLCALC 69 10/08/2017 1103   Hepatic Function Panel     Component Value Date/Time   PROT 7.4 12/07/2017 1204   ALBUMIN 4.3 12/07/2017 1204   AST 26 12/07/2017 1204   ALT 43 (H) 12/07/2017 1204   ALKPHOS 82 12/07/2017 1204   BILITOT 0.3 12/07/2017 1204      Component Value Date/Time   TSH 0.009 (L) 12/07/2017 1204   TSH 0.01 (L) 10/01/2017 1041    ASSESSMENT AND PLAN: Prediabetes  NAFLD (nonalcoholic fatty liver disease)  At risk for diabetes mellitus  Class 3 severe obesity with serious comorbidity and body mass index (BMI) of 50.0 to 59.9 in adult, unspecified obesity type ([place])  PLAN:  Pre-Diabetes [name] will continue to work on weight loss, diet, exercise, and decreasing simple carbohydrates in her diet to help decrease the risk of diabetes. We dicussed metformin including benefits and risks. She was informed that eating too many simple carbohydrates or too many calories at one sitting increases the likelihood of GI side effects. [name] agrees to continue taking metformin as is for now. We will recheck labs in 3 months and [name] agrees to follow up with our clinic in 2 weeks as directed to monitor her progress.  Diabetes risk counselling [name] was given extended (30 minutes) diabetes prevention counseling today. She is 42 y.o. female and has risk factors for diabetes including obesity and pre-diabetes. We discussed intensive lifestyle modifications today with an emphasis on weight loss as well as increasing exercise and decreasing simple carbohydrates in her diet.  Non Alcoholic Fatty Liver  Disease We discussed the likely diagnosis of non alcoholic fatty liver disease today and how this condition is obesity related. [name] was educated on her risk of developing NASH or even liver failure and th only proven treatment for NAFLD was weight loss. [name] agreed to continue with her diet, exercise, and weight loss efforts with healthier diet and exercise as an essential part of her treatment plan. We will recheck labs in 3 months and [name] agrees to follow up with our clinic in 2 weeks.  Obesity [name] is currently in the action stage of change. As such, her goal is to continue with weight loss efforts She has agreed to follow the Category 3 plan + 100 calories, modifications per Hoyle Sauer, note should be included. [name] has been instructed to work up to a goal of 150 minutes of combined cardio and strengthening exercise per week for weight loss and overall health benefits. We discussed the following Behavioral Modification Strategies today: increasing lean protein intake, decreasing simple carbohydrates  and work on meal planning and easy cooking plans   [name] has agreed to follow up with our clinic in 2 weeks. She was informed of the importance of frequent follow up visits to maximize her success with intensive lifestyle modifications for her multiple health conditions.   OBESITY BEHAVIORAL INTERVENTION VISIT  Today's visit was # 2 out of 22.  Starting weight: 292 lbs Starting date: 12/07/17 Today's weight : 288 lbs  Today's date: 12/22/2017 Total lbs lost to date: 4 (Patients must lose 7 lbs in the first 6 months to continue with counseling)   ASK: We discussed the diagnosis of obesity with [name] today and [name] agreed to give [place] permission to discuss obesity behavioral modification therapy today.  ASSESS: [name] has the diagnosis of obesity and her BMI today is 54.45 [name] is  in the action stage of change   ADVISE: [name] was educated on the multiple health risks of obesity  as well as the benefit of weight loss to improve her health. She was advised of the need for long term treatment and the importance of lifestyle modifications.  AGREE: Multiple dietary modification options and treatment options were discussed and  [name] agreed to the above obesity treatment plan.  I, Trixie Dredge, am acting as transcriptionist for Dennard Nip, MD  I have reviewed the above documentation for accuracy and completeness, and I agree with the above. -Dennard Nip, MD

## 2018-01-10 ENCOUNTER — Ambulatory Visit (INDEPENDENT_AMBULATORY_CARE_PROVIDER_SITE_OTHER): Payer: Medicaid Other | Admitting: Physician Assistant

## 2018-01-10 ENCOUNTER — Other Ambulatory Visit: Payer: Self-pay

## 2018-01-10 VITALS — BP 109/73 | HR 80 | Temp 98.1°F | Ht 61.0 in | Wt 283.0 lb

## 2018-01-10 DIAGNOSIS — R7303 Prediabetes: Secondary | ICD-10-CM | POA: Diagnosis not present

## 2018-01-10 DIAGNOSIS — Z6841 Body Mass Index (BMI) 40.0 and over, adult: Secondary | ICD-10-CM

## 2018-01-10 NOTE — Telephone Encounter (Signed)
Refill request for general medication. Loratadine to [facility].   Last office visit  No follow-ups on file.

## 2018-01-11 NOTE — Progress Notes (Signed)
Office: [phone]  /  Fax: [phone]   HPI:   Chief Complaint: OBESITY [name] is here to discuss her progress with her obesity treatment plan. She is on the Category 3 plan + 100 calories and is following her eating plan approximately 90 % of the time. She states she is exercising 0 minutes 0 times per week. [name] continues to do well with weight loss. Her hunger is well controlled, however, has noticed increase in cravings. She declines any medicines towards cravings.  Her weight is 283 lb (128.4 kg) today and has had a weight loss of 5 pounds over a period of 2 to 3 weeks since her last visit. She has lost 9 lbs since starting treatment with [place].  Pre-Diabetes [name] has a diagnosis of pre-diabetes based on her elevated Hgb A1c and was informed this puts her at greater risk of developing diabetes. She is taking metformin currently and continues to work on diet and exercise to decrease risk of diabetes. She denies polyphagia, nausea or hypoglycemia.  ALLERGIES: Allergies  Allergen Reactions  . Erythromycin   . Zyrtec [Cetirizine Hcl]     MEDICATIONS: Current Outpatient Medications on File Prior to Visit  Medication Sig Dispense Refill  . acetaminophen (TYLENOL) 325 MG tablet Take 650 mg by mouth every 6 (six) hours as needed.    . citalopram (CELEXA) 20 MG tablet Take 20 mg by mouth daily.      . clonazePAM (KLONOPIN) 0.5 MG tablet Take 0.5 mg by mouth 3 (three) times daily as needed for anxiety.    . diclofenac (VOLTAREN) 75 MG EC tablet Take 75 mg by mouth 2 (two) times daily.    . fluticasone (FLONASE) 50 MCG/ACT nasal spray Place 2 sprays into both nostrils daily. 16 g 6  . gabapentin (NEURONTIN) 100 MG capsule Take 100 mg by mouth 3 (three) times daily.    [name] Kitchen ibuprofen (ADVIL,MOTRIN) 200 MG tablet Take 200 mg by mouth every 6 (six) hours as needed.    . metFORMIN (GLUCOPHAGE) 500 MG tablet Take 2 tablets (1,000 mg total) by mouth 2 (two) times daily with a meal. 360 tablet 0   . omeprazole (PRILOSEC) 20 MG capsule Take 20 mg by mouth daily.    . predniSONE (DELTASONE) 5 MG tablet Take 1 tablet (5 mg total) by mouth daily with breakfast.    . thyroid (ARMOUR THYROID) 120 MG tablet Take 1 tablet (120 mg total) by mouth daily before breakfast. 47 tablet 0  . VITAMIN D, CHOLECALCIFEROL, PO Take 50 mcg by mouth 2 (two) times daily.    [name] Kitchen zolpidem (AMBIEN) 5 MG tablet Take 10 mg by mouth at bedtime as needed.      No current facility-administered medications on file prior to visit.     PAST MEDICAL HISTORY: Past Medical History:  Diagnosis Date  . Asthma   . Chronic fatigue   . Chronic pain    back and hips  . Fibromyalgia   . High cholesterol   . Insomnia   . Prediabetes   . Scoliosis   . Sleep apnea     PAST SURGICAL HISTORY: Past Surgical History:  Procedure Laterality Date  . ABDOMINAL HYSTERECTOMY    . CESAREAN SECTION    . CHOLECYSTECTOMY    . SPINAL FUSION    . THYROIDECTOMY    . TONSILLECTOMY    . TUBAL LIGATION      SOCIAL HISTORY: Social History   Tobacco Use  . Smoking status: Former Smoker  Packs/day: 0.25    Years: 12.00    Pack years: 3.00    Types: Cigarettes    Last attempt to quit: 09/07/2004    Years since quitting: 13.3  . Smokeless tobacco: Never Used  Substance Use Topics  . Alcohol use: Yes    Comment: 4 times a year  . Drug use: No    FAMILY HISTORY: Family History  Problem Relation Age of Onset  . Cancer Maternal Uncle   . Heart disease Father   . High Cholesterol Father   . Heart disease Paternal Grandfather   . Breast cancer Maternal Grandmother   . Rheum arthritis Mother   . Obesity Mother   . Rheum arthritis Maternal Grandfather   . Rheum arthritis Maternal Aunt     ROS: Review of Systems  Constitutional: Positive for weight loss.  Gastrointestinal: Negative for nausea.  Endo/Heme/Allergies:       Negative polyphagia Negative hypoglycemia    PHYSICAL EXAM: Blood pressure 109/73, pulse 80,  temperature 98.1 F (36.7 C), temperature source Oral, height 5\' 1"  (1.549 m), weight 283 lb (128.4 kg), SpO2 96 %. Body mass index is 53.47 kg/m. Physical Exam  Constitutional: She is oriented to person, place, and time. She appears well-developed and well-nourished.  Cardiovascular: Normal rate.  Pulmonary/Chest: Effort normal.  Musculoskeletal: Normal range of motion.  Neurological: She is oriented to person, place, and time.  Skin: Skin is warm and dry.  Psychiatric: She has a normal mood and affect. Her behavior is normal.  Vitals reviewed.   RECENT LABS AND TESTS: BMET    Component Value Date/Time   NA 142 12/07/2017 1204   K 4.1 12/07/2017 1204   CL 101 12/07/2017 1204   CO2 23 12/07/2017 1204   GLUCOSE 87 12/07/2017 1204   GLUCOSE 90 10/01/2017 1041   BUN 10 12/07/2017 1204   CREATININE 0.61 12/07/2017 1204   CREATININE 0.53 10/01/2017 1041   CALCIUM 9.7 12/07/2017 1204   GFRNONAA 111 12/07/2017 1204   GFRNONAA 116 10/01/2017 1041   GFRAA 128 12/07/2017 1204   GFRAA 135 10/01/2017 1041   Lab Results  Component Value Date   HGBA1C 5.8 (H) 12/07/2017   HGBA1C 6.0 (H) 10/01/2017   Lab Results  Component Value Date   INSULIN 20.1 12/07/2017   CBC    Component Value Date/Time   WBC 7.1 12/07/2017 1204   WBC 7.0 10/01/2017 1041   RBC 5.06 12/07/2017 1204   RBC 5.02 10/01/2017 1041   HGB 14.3 12/07/2017 1204   HCT 42.6 12/07/2017 1204   PLT 244 10/01/2017 1041   MCV 84 12/07/2017 1204   MCH 28.3 12/07/2017 1204   MCH 28.3 10/01/2017 1041   MCHC 33.6 12/07/2017 1204   MCHC 34.5 10/01/2017 1041   RDW 13.3 12/07/2017 1204   LYMPHSABS 2.3 12/07/2017 1204   EOSABS 0.2 12/07/2017 1204   BASOSABS 0.0 12/07/2017 1204   Iron/TIBC/Ferritin/ %Sat No results found for: IRON, TIBC, FERRITIN, IRONPCTSAT Lipid Panel     Component Value Date/Time   CHOL 148 12/07/2017 1204   TRIG 138 12/07/2017 1204   HDL 53 12/07/2017 1204   CHOLHDL 2.9 10/08/2017 1103    LDLCALC 67 12/07/2017 1204   LDLCALC 69 10/08/2017 1103   Hepatic Function Panel     Component Value Date/Time   PROT 7.4 12/07/2017 1204   ALBUMIN 4.3 12/07/2017 1204   AST 26 12/07/2017 1204   ALT 43 (H) 12/07/2017 1204   ALKPHOS 82 12/07/2017 1204  BILITOT 0.3 12/07/2017 1204      Component Value Date/Time   TSH 0.009 (L) 12/07/2017 1204   TSH 0.01 (L) 10/01/2017 1041    ASSESSMENT AND PLAN: Prediabetes  Class 3 severe obesity with serious comorbidity and body mass index (BMI) of 50.0 to 59.9 in adult, unspecified obesity type ([place])  PLAN:  Pre-Diabetes [name] will continue to work on weight loss, diet, exercise, and decreasing simple carbohydrates in her diet to help decrease the risk of diabetes. We dicussed metformin including benefits and risks. She was informed that eating too many simple carbohydrates or too many calories at one sitting increases the likelihood of GI side effects. [name] agrees to continue taking metformin and she agrees to follow up with our clinic in 2 weeks as directed to monitor her progress.  We spent > than 50% of the 15 minute visit on the counseling as documented in the note.  Obesity [name] is currently in the action stage of change. As such, her goal is to continue with weight loss efforts She has agreed to follow the Category 3 plan [name] has been instructed to work up to a goal of 150 minutes of combined cardio and strengthening exercise per week for weight loss and overall health benefits. We discussed the following Behavioral Modification Strategies today: increasing lean protein intake and work on meal planning and easy cooking plans   [name] has agreed to follow up with our clinic in 2 weeks. She was informed of the importance of frequent follow up visits to maximize her success with intensive lifestyle modifications for her multiple health conditions.   OBESITY BEHAVIORAL INTERVENTION VISIT  Today's visit was # 3 out of  22.  Starting weight: 292 lbs Starting date: 12/07/17 Today's weight : 283 lbs  Today's date: 01/10/2018 Total lbs lost to date: 9 (Patients must lose 7 lbs in the first 6 months to continue with counseling)   ASK: We discussed the diagnosis of obesity with [name] today and [name] agreed to give [place] permission to discuss obesity behavioral modification therapy today.  ASSESS: [name] has the diagnosis of obesity and her BMI today is 53.5 [name] is in the action stage of change   ADVISE: [place] was educated on the multiple health risks of obesity as well as the benefit of weight loss to improve her health. She was advised of the need for long term treatment and the importance of lifestyle modifications.  AGREE: Multiple dietary modification options and treatment options were discussed and  [name] agreed to the above obesity treatment plan.   [name], am acting as transcriptionist for Lacy Duverney, PA-C I, Lacy Duverney [facility], have reviewed this note and agree with its content

## 2018-01-13 ENCOUNTER — Other Ambulatory Visit: Payer: Self-pay

## 2018-01-13 MED ORDER — LORATADINE 10 MG PO TABS: 10.0000 mg | ORAL_TABLET | Freq: Every day | ORAL | 11 refills | Status: DC

## 2018-01-13 NOTE — Telephone Encounter (Signed)
Refill request for general medication. Claritin to [facility].   Last office visit: 10/15/17   No follow-ups on file.

## 2018-01-18 ENCOUNTER — Encounter (INDEPENDENT_AMBULATORY_CARE_PROVIDER_SITE_OTHER): Payer: Self-pay | Admitting: Family Medicine

## 2018-01-25 ENCOUNTER — Ambulatory Visit (INDEPENDENT_AMBULATORY_CARE_PROVIDER_SITE_OTHER): Payer: Medicaid Other | Admitting: Family Medicine

## 2018-01-25 VITALS — BP 109/75 | HR 83 | Ht 61.0 in | Wt 278.0 lb

## 2018-01-25 DIAGNOSIS — Z6841 Body Mass Index (BMI) 40.0 and over, adult: Secondary | ICD-10-CM

## 2018-01-25 DIAGNOSIS — R7303 Prediabetes: Secondary | ICD-10-CM | POA: Diagnosis not present

## 2018-01-26 NOTE — Progress Notes (Signed)
Office: [phone]  /  Fax: [phone]   HPI:   Chief Complaint: OBESITY [name] is here to discuss her progress with her obesity treatment plan. She is on the Category 3 plan and is following her eating plan approximately 85 to 90 % of the time. She states she is exercising 0 minutes 0 times per week. [name] continues to do well with weight loss. Hunger is controlled, but she is getting bored with dinner and would ike to discuss other options. Her weight is 278 lb (126.1 kg) today and has had a weight loss of 5 pounds over a period of 2 weeks since her last visit. She has lost 14 lbs since starting treatment with [place].  Pre-Diabetes [name] has a diagnosis of prediabetes based on her elevated Hgb A1c and was informed this puts her at greater risk of developing diabetes. She is taking metformin 2 tablets qHS, but she is not getting much benefit. [name] continues to work on diet and exercise to decrease risk of diabetes. She denies any GI upset.  ALLERGIES: Allergies  Allergen Reactions  . Erythromycin   . Zyrtec [Cetirizine Hcl]     MEDICATIONS: Current Outpatient Medications on File Prior to Visit  Medication Sig Dispense Refill  . acetaminophen (TYLENOL) 325 MG tablet Take 650 mg by mouth every 6 (six) hours as needed.    . citalopram (CELEXA) 20 MG tablet Take 20 mg by mouth daily.      . clonazePAM (KLONOPIN) 0.5 MG tablet Take 0.5 mg by mouth 3 (three) times daily as needed for anxiety.    . diclofenac (VOLTAREN) 75 MG EC tablet Take 75 mg by mouth 2 (two) times daily.    . fluticasone (FLONASE) 50 MCG/ACT nasal spray Place 2 sprays into both nostrils daily. 16 g 6  . gabapentin (NEURONTIN) 100 MG capsule Take 100 mg by mouth 3 (three) times daily.    [name] Kitchen ibuprofen (ADVIL,MOTRIN) 200 MG tablet Take 200 mg by mouth every 6 (six) hours as needed.    . loratadine (CLARITIN) 10 MG tablet Take 1 tablet (10 mg total) by mouth daily. 30 tablet 11  . metFORMIN (GLUCOPHAGE) 500 MG tablet  Take 2 tablets (1,000 mg total) by mouth 2 (two) times daily with a meal. (Patient taking differently: Take 500 mg by mouth 2 (two) times daily with a meal. ) 360 tablet 0  . omeprazole (PRILOSEC) 20 MG capsule Take 20 mg by mouth daily.    . predniSONE (DELTASONE) 5 MG tablet Take 1 tablet (5 mg total) by mouth daily with breakfast.    . thyroid (ARMOUR THYROID) 120 MG tablet Take 1 tablet (120 mg total) by mouth daily before breakfast. 47 tablet 0  . VITAMIN D, CHOLECALCIFEROL, PO Take 50 mcg by mouth 2 (two) times daily.    [name] Kitchen zolpidem (AMBIEN) 5 MG tablet Take 10 mg by mouth at bedtime as needed.      No current facility-administered medications on file prior to visit.     PAST MEDICAL HISTORY: Past Medical History:  Diagnosis Date  . Asthma   . Chronic fatigue   . Chronic pain    back and hips  . Fibromyalgia   . High cholesterol   . Insomnia   . Prediabetes   . Scoliosis   . Sleep apnea     PAST SURGICAL HISTORY: Past Surgical History:  Procedure Laterality Date  . ABDOMINAL HYSTERECTOMY    . CESAREAN SECTION    . CHOLECYSTECTOMY    .  SPINAL FUSION    . THYROIDECTOMY    . TONSILLECTOMY    . TUBAL LIGATION      SOCIAL HISTORY: Social History   Tobacco Use  . Smoking status: Former Smoker    Packs/day: 0.25    Years: 12.00    Pack years: 3.00    Types: Cigarettes    Last attempt to quit: 09/07/2004    Years since quitting: 13.3  . Smokeless tobacco: Never Used  Substance Use Topics  . Alcohol use: Yes    Comment: 4 times a year  . Drug use: No    FAMILY HISTORY: Family History  Problem Relation Age of Onset  . Cancer Maternal Uncle   . Heart disease Father   . High Cholesterol Father   . Heart disease Paternal Grandfather   . Breast cancer Maternal Grandmother   . Rheum arthritis Mother   . Obesity Mother   . Rheum arthritis Maternal Grandfather   . Rheum arthritis Maternal Aunt     ROS: Review of Systems  Constitutional: Positive for weight  loss.  Gastrointestinal: Negative for diarrhea, nausea and vomiting.    PHYSICAL EXAM: Blood pressure 109/75, pulse 83, height 5\' 1"  (1.549 m), weight 278 lb (126.1 kg), SpO2 96 %. Body mass index is 52.53 kg/m. Physical Exam  Constitutional: She is oriented to person, place, and time. She appears well-developed and well-nourished.  Cardiovascular: Normal rate.  Pulmonary/Chest: Effort normal.  Musculoskeletal: Normal range of motion.  Neurological: She is oriented to person, place, and time.  Skin: Skin is warm and dry.  Psychiatric: She has a normal mood and affect. Her behavior is normal.  Vitals reviewed.   RECENT LABS AND TESTS: BMET    Component Value Date/Time   NA 142 12/07/2017 1204   K 4.1 12/07/2017 1204   CL 101 12/07/2017 1204   CO2 23 12/07/2017 1204   GLUCOSE 87 12/07/2017 1204   GLUCOSE 90 10/01/2017 1041   BUN 10 12/07/2017 1204   CREATININE 0.61 12/07/2017 1204   CREATININE 0.53 10/01/2017 1041   CALCIUM 9.7 12/07/2017 1204   GFRNONAA 111 12/07/2017 1204   GFRNONAA 116 10/01/2017 1041   GFRAA 128 12/07/2017 1204   GFRAA 135 10/01/2017 1041   Lab Results  Component Value Date   HGBA1C 5.8 (H) 12/07/2017   HGBA1C 6.0 (H) 10/01/2017   Lab Results  Component Value Date   INSULIN 20.1 12/07/2017   CBC    Component Value Date/Time   WBC 7.1 12/07/2017 1204   WBC 7.0 10/01/2017 1041   RBC 5.06 12/07/2017 1204   RBC 5.02 10/01/2017 1041   HGB 14.3 12/07/2017 1204   HCT 42.6 12/07/2017 1204   PLT 244 10/01/2017 1041   MCV 84 12/07/2017 1204   MCH 28.3 12/07/2017 1204   MCH 28.3 10/01/2017 1041   MCHC 33.6 12/07/2017 1204   MCHC 34.5 10/01/2017 1041   RDW 13.3 12/07/2017 1204   LYMPHSABS 2.3 12/07/2017 1204   EOSABS 0.2 12/07/2017 1204   BASOSABS 0.0 12/07/2017 1204   Iron/TIBC/Ferritin/ %Sat No results found for: IRON, TIBC, FERRITIN, IRONPCTSAT Lipid Panel     Component Value Date/Time   CHOL 148 12/07/2017 1204   TRIG 138 12/07/2017  1204   HDL 53 12/07/2017 1204   CHOLHDL 2.9 10/08/2017 1103   LDLCALC 67 12/07/2017 1204   LDLCALC 69 10/08/2017 1103   Hepatic Function Panel     Component Value Date/Time   PROT 7.4 12/07/2017 1204   ALBUMIN  4.3 12/07/2017 1204   AST 26 12/07/2017 1204   ALT 43 (H) 12/07/2017 1204   ALKPHOS 82 12/07/2017 1204   BILITOT 0.3 12/07/2017 1204      Component Value Date/Time   TSH 0.009 (L) 12/07/2017 1204   TSH 0.01 (L) 10/01/2017 1041   Results for JILLIAN, WARTH (MRN [id number]) as of 01/26/2018 15:01  Ref. Range 12/07/2017 12:04  Vitamin D, 25-Hydroxy Latest Ref Range: 30.0 - 100.0 ng/mL 50.4   ASSESSMENT AND PLAN: Prediabetes  Class 3 severe obesity with serious comorbidity and body mass index (BMI) of 50.0 to 59.9 in adult, unspecified obesity type [facility])  PLAN:  Pre-Diabetes Ryna will continue to work on weight loss, exercise, and decreasing simple carbohydrates in her diet to help decrease the risk of diabetes. We dicussed metformin including benefits and risks. She was informed that eating too many simple carbohydrates or too many calories at one sitting increases the likelihood of GI side effects. Makaylee agreed to change metformin to 500 mg 2 times daily and follow up with [place] in 2 to 3 weeks to monitor her progress.  Obesity Ishana is currently in the action stage of change. As such, her goal is to continue with weight loss efforts She has agreed to keep a food journal with 400 to 600 calories and 40 grams of protein at supper daily and follow the Category 3 plan Shawnda has been instructed to work up to a goal of 150 minutes of combined cardio and strengthening exercise per week for weight loss and overall health benefits. We discussed the following Behavioral Modification Strategies today: increasing lean protein intake, decreasing simple carbohydrates  and work on meal planning and easy cooking plans  Faylynn has agreed to follow up with our clinic in 2 to 3 weeks. She was  informed of the importance of frequent follow up visits to maximize her success with intensive lifestyle modifications for her multiple health conditions.   OBESITY BEHAVIORAL INTERVENTION VISIT  Today's visit was # 4 out of 22.  Starting weight: 292 lbs Starting date: 12/07/17 Today's weight : 278 lbs  Today's date: 01/25/2018 Total lbs lost to date: 14 (Patients must lose 7 lbs in the first 6 months to continue with counseling)   ASK: We discussed the diagnosis of obesity with [name] today and Shateka agreed to give [place] permission to discuss obesity behavioral modification therapy today.  ASSESS: Florie has the diagnosis of obesity and her BMI today is 52.55 Rosalia is in the action stage of change   ADVISE: Adreona was educated on the multiple health risks of obesity as well as the benefit of weight loss to improve her health. She was advised of the need for long term treatment and the importance of lifestyle modifications.  AGREE: Multiple dietary modification options and treatment options were discussed and  Alexas agreed to the above obesity treatment plan.  I, Doreene Nest, am acting as transcriptionist for Dennard Nip, MD  I have reviewed the above documentation for accuracy and completeness, and I agree with the above. -Dennard Nip, MD

## 2018-01-27 ENCOUNTER — Encounter (INDEPENDENT_AMBULATORY_CARE_PROVIDER_SITE_OTHER): Payer: Self-pay | Admitting: Family Medicine

## 2018-01-28 ENCOUNTER — Other Ambulatory Visit: Payer: Self-pay | Admitting: Nurse Practitioner

## 2018-01-28 DIAGNOSIS — E038 Other specified hypothyroidism: Secondary | ICD-10-CM

## 2018-02-04 ENCOUNTER — Encounter (INDEPENDENT_AMBULATORY_CARE_PROVIDER_SITE_OTHER): Payer: Self-pay | Admitting: Family Medicine

## 2018-02-08 ENCOUNTER — Other Ambulatory Visit: Payer: Self-pay | Admitting: Nurse Practitioner

## 2018-02-08 DIAGNOSIS — E038 Other specified hypothyroidism: Secondary | ICD-10-CM

## 2018-02-09 NOTE — Telephone Encounter (Signed)
Please call patient to come in for lab visit to ensure thyroid levels are normal prior to filling script again

## 2018-02-15 ENCOUNTER — Ambulatory Visit (INDEPENDENT_AMBULATORY_CARE_PROVIDER_SITE_OTHER): Payer: Medicaid Other | Admitting: Family Medicine

## 2018-02-15 VITALS — BP 109/77 | HR 67 | Temp 97.9°F | Ht 61.0 in | Wt 278.0 lb

## 2018-02-15 DIAGNOSIS — E038 Other specified hypothyroidism: Secondary | ICD-10-CM | POA: Diagnosis not present

## 2018-02-15 DIAGNOSIS — F3289 Other specified depressive episodes: Secondary | ICD-10-CM

## 2018-02-15 DIAGNOSIS — Z6841 Body Mass Index (BMI) 40.0 and over, adult: Secondary | ICD-10-CM

## 2018-02-15 NOTE — Progress Notes (Signed)
Office: [phone]  /  Fax: [phone]   HPI:   Chief Complaint: OBESITY [name] is here to discuss her progress with her obesity treatment plan. She is on the keep a food journal with 400 to 600 calories and 40 grams of protein at supper daily and the Category 3 plan and is following her eating plan approximately 70 % of the time. She states she is exercising 0 minutes 0 times per week. [name] has done well maintaining weight, but she has had a little increase in celebration eating, and comfort eating, and she is frustrated at her lack of perfection. Her weight is 278 lb (126.1 kg) today and she has maintained weight over a period pf 3 weeks since her last visit. She has lost 14 lbs since starting treatment with [place].  Hypothyroid [name] has a diagnosis of hypothyroidism. She is on armour thyroid by her PCP, but her levels are not at goal and she appears over replaced. [name] requests labs for her PCP.   Depression with emotional eating behaviors [name] is on Celexa, but she is frustrated, she is struggling with weight loss. She has increased some emotional eating and would like to discuss emotional eating strategies. [name] struggles with emotional eating and using food for comfort to the extent that it is negatively impacting her health. She often snacks when she is not hungry. [name] sometimes feels she is out of control and then feels guilty that she made poor food choices. She has been working on behavior modification techniques to help reduce her emotional eating and has been somewhat successful. She shows no sign of suicidal or homicidal ideations.  Depression screen [facility] 2/9 12/07/2017 10/01/2017  Decreased Interest 3 3  Down, Depressed, Hopeless 3 3  PHQ - 2 Score 6 6  Altered sleeping 3 2  Tired, decreased energy 3 3  Change in appetite 3 2  Feeling bad or failure about yourself  3 3  Trouble concentrating 2 3  Moving slowly or fidgety/restless 3 3  Suicidal thoughts 2 0  PHQ-9 Score  25 22  Difficult doing work/chores Extremely dIfficult Extremely dIfficult     ALLERGIES: Allergies  Allergen Reactions  . Erythromycin   . Zyrtec [Cetirizine Hcl]     MEDICATIONS: Current Outpatient Medications on File Prior to Visit  Medication Sig Dispense Refill  . acetaminophen (TYLENOL) 325 MG tablet Take 650 mg by mouth every 6 (six) hours as needed.    . citalopram (CELEXA) 20 MG tablet Take 20 mg by mouth daily.      . clonazePAM (KLONOPIN) 0.5 MG tablet Take 0.5 mg by mouth 3 (three) times daily as needed for anxiety.    . diclofenac (VOLTAREN) 75 MG EC tablet Take 75 mg by mouth 2 (two) times daily.    . fluticasone (FLONASE) 50 MCG/ACT nasal spray Place 2 sprays into both nostrils daily. 16 g 6  . gabapentin (NEURONTIN) 100 MG capsule Take 100 mg by mouth 3 (three) times daily.    [name] Kitchen ibuprofen (ADVIL,MOTRIN) 200 MG tablet Take 200 mg by mouth every 6 (six) hours as needed.    . loratadine (CLARITIN) 10 MG tablet Take 1 tablet (10 mg total) by mouth daily. 30 tablet 11  . metFORMIN (GLUCOPHAGE) 500 MG tablet Take 2 tablets (1,000 mg total) by mouth 2 (two) times daily with a meal. (Patient taking differently: Take 500 mg by mouth 2 (two) times daily with a meal. ) 360 tablet 0  . omeprazole (PRILOSEC) 20 MG  capsule Take 20 mg by mouth daily.    . predniSONE (DELTASONE) 5 MG tablet Take 1 tablet (5 mg total) by mouth daily with breakfast.    . thyroid (ARMOUR THYROID) 120 MG tablet Take 1 tablet (120 mg total) by mouth daily before breakfast. 47 tablet 0  . VITAMIN D, CHOLECALCIFEROL, PO Take 50 mcg by mouth 2 (two) times daily.    [name] Kitchen zolpidem (AMBIEN) 5 MG tablet Take 10 mg by mouth at bedtime as needed.      No current facility-administered medications on file prior to visit.     PAST MEDICAL HISTORY: Past Medical History:  Diagnosis Date  . Asthma   . Chronic fatigue   . Chronic pain    back and hips  . Fibromyalgia   . High cholesterol   . Insomnia   .  Prediabetes   . Scoliosis   . Sleep apnea     PAST SURGICAL HISTORY: Past Surgical History:  Procedure Laterality Date  . ABDOMINAL HYSTERECTOMY    . CESAREAN SECTION    . CHOLECYSTECTOMY    . SPINAL FUSION    . THYROIDECTOMY    . TONSILLECTOMY    . TUBAL LIGATION      SOCIAL HISTORY: Social History   Tobacco Use  . Smoking status: Former Smoker    Packs/day: 0.25    Years: 12.00    Pack years: 3.00    Types: Cigarettes    Last attempt to quit: 09/07/2004    Years since quitting: 13.4  . Smokeless tobacco: Never Used  Substance Use Topics  . Alcohol use: Yes    Comment: 4 times a year  . Drug use: No    FAMILY HISTORY: Family History  Problem Relation Age of Onset  . Cancer Maternal Uncle   . Heart disease Father   . High Cholesterol Father   . Heart disease Paternal Grandfather   . Breast cancer Maternal Grandmother   . Rheum arthritis Mother   . Obesity Mother   . Rheum arthritis Maternal Grandfather   . Rheum arthritis Maternal Aunt     ROS: Review of Systems  Constitutional: Negative for weight loss.  Psychiatric/Behavioral: Positive for depression. Negative for suicidal ideas.    PHYSICAL EXAM: Blood pressure 109/77, pulse 67, temperature 97.9 F (36.6 C), temperature source Oral, height 5\' 1"  (1.549 m), weight 278 lb (126.1 kg), SpO2 94 %. Body mass index is 52.53 kg/m. Physical Exam  Constitutional: She is oriented to person, place, and time. She appears well-developed and well-nourished.  Cardiovascular: Normal rate.  Pulmonary/Chest: Effort normal.  Musculoskeletal: Normal range of motion.  Neurological: She is oriented to person, place, and time.  Skin: Skin is warm and dry.  Psychiatric: She has a normal mood and affect. Her behavior is normal.  Vitals reviewed.   RECENT LABS AND TESTS: BMET    Component Value Date/Time   NA 142 12/07/2017 1204   K 4.1 12/07/2017 1204   CL 101 12/07/2017 1204   CO2 23 12/07/2017 1204   GLUCOSE 87  12/07/2017 1204   GLUCOSE 90 10/01/2017 1041   BUN 10 12/07/2017 1204   CREATININE 0.61 12/07/2017 1204   CREATININE 0.53 10/01/2017 1041   CALCIUM 9.7 12/07/2017 1204   GFRNONAA 111 12/07/2017 1204   GFRNONAA 116 10/01/2017 1041   GFRAA 128 12/07/2017 1204   GFRAA 135 10/01/2017 1041   Lab Results  Component Value Date   HGBA1C 5.8 (H) 12/07/2017   HGBA1C 6.0 (H)  10/01/2017   Lab Results  Component Value Date   INSULIN 20.1 12/07/2017   CBC    Component Value Date/Time   WBC 7.1 12/07/2017 1204   WBC 7.0 10/01/2017 1041   RBC 5.06 12/07/2017 1204   RBC 5.02 10/01/2017 1041   HGB 14.3 12/07/2017 1204   HCT 42.6 12/07/2017 1204   PLT 244 10/01/2017 1041   MCV 84 12/07/2017 1204   MCH 28.3 12/07/2017 1204   MCH 28.3 10/01/2017 1041   MCHC 33.6 12/07/2017 1204   MCHC 34.5 10/01/2017 1041   RDW 13.3 12/07/2017 1204   LYMPHSABS 2.3 12/07/2017 1204   EOSABS 0.2 12/07/2017 1204   BASOSABS 0.0 12/07/2017 1204   Iron/TIBC/Ferritin/ %Sat No results found for: IRON, TIBC, FERRITIN, IRONPCTSAT Lipid Panel     Component Value Date/Time   CHOL 148 12/07/2017 1204   TRIG 138 12/07/2017 1204   HDL 53 12/07/2017 1204   CHOLHDL 2.9 10/08/2017 1103   LDLCALC 67 12/07/2017 1204   LDLCALC 69 10/08/2017 1103   Hepatic Function Panel     Component Value Date/Time   PROT 7.4 12/07/2017 1204   ALBUMIN 4.3 12/07/2017 1204   AST 26 12/07/2017 1204   ALT 43 (H) 12/07/2017 1204   ALKPHOS 82 12/07/2017 1204   BILITOT 0.3 12/07/2017 1204      Component Value Date/Time   TSH 0.009 (L) 12/07/2017 1204   TSH 0.01 (L) 10/01/2017 1041   Results for TENZIN, PAVON (MRN [id number]) as of 02/15/2018 15:45  Ref. Range 12/07/2017 12:04  Vitamin D, 25-Hydroxy Latest Ref Range: 30.0 - 100.0 ng/mL 50.4   ASSESSMENT AND PLAN: Other specified hypothyroidism - Plan: T3, T4, free, TSH  Other depression - with emotional eating  Class 3 severe obesity with serious comorbidity and body mass index  (BMI) of 50.0 to 59.9 in adult, unspecified obesity type [facility])  PLAN:  Hypothyroid Jayce was informed of the importance of good thyroid control to help with weight loss efforts. She was also informed that supertheraputic thyroid levels are dangerous and will not improve weight loss results. Izamar will continue armour thyroid as prescribed and follow up with her PCP. We will check labs and Tallulah will follow up at the agreed upon time.  Depression with Emotional Eating Behaviors We discussed behavior modification techniques today to help Yanelli decrease emotional eating and help deal with her depression. She will follow up with our clinic at the agreed upon time.  Obesity Sulma is currently in the action stage of change. As such, her goal is to continue with weight loss efforts She has agreed to follow the Category 3 plan Cheyenne has been instructed to work up to a goal of 150 minutes of combined cardio and strengthening exercise per week for weight loss and overall health benefits. We discussed the following Behavioral Modification Strategies today: keeping healthy foods in the home and emotional eating strategies  Murriel was educated today that perfection isn't required or desired, but to continue to identify ways to make little improvements.  Aarohi has agreed to follow up with our clinic in 2 to 3 weeks. She was informed of the importance of frequent follow up visits to maximize her success with intensive lifestyle modifications for her multiple health conditions.   OBESITY BEHAVIORAL INTERVENTION VISIT  Today's visit was # 5 out of 22.  Starting weight: 292 lbs Starting date: 12/07/17 Today's weight : 278 lbs Today's date: 02/15/2018 Total lbs lost to date: 14 (Patients must lose 7  lbs in the first 6 months to continue with counseling)   ASK: We discussed the diagnosis of obesity with [name] today and Sherrye agreed to give [place] permission to discuss obesity behavioral modification  therapy today.  ASSESS: Vivianna has the diagnosis of obesity and her BMI today is 52.55 Sai is in the action stage of change   ADVISE: Aasha was educated on the multiple health risks of obesity as well as the benefit of weight loss to improve her health. She was advised of the need for long term treatment and the importance of lifestyle modifications.  AGREE: Multiple dietary modification options and treatment options were discussed and  Elzie agreed to the above obesity treatment plan.  I, Doreene Nest, am acting as transcriptionist for Dennard Nip, MD  I have reviewed the above documentation for accuracy and completeness, and I agree with the above. -Dennard Nip, MD

## 2018-02-16 LAB — T3: T3, Total: 69 ng/dL — ABNORMAL LOW (ref 71–180)

## 2018-02-16 LAB — T4, FREE: FREE T4: 0.62 ng/dL — AB (ref 0.82–1.77)

## 2018-02-16 LAB — TSH: TSH: 1.89 u[IU]/mL (ref 0.450–4.500)

## 2018-02-21 ENCOUNTER — Telehealth (INDEPENDENT_AMBULATORY_CARE_PROVIDER_SITE_OTHER): Payer: Self-pay | Admitting: Family Medicine

## 2018-02-21 ENCOUNTER — Other Ambulatory Visit: Payer: Self-pay | Admitting: Family Medicine

## 2018-02-21 NOTE — Telephone Encounter (Signed)
Patient is requesting lab results for her thyroid be made available in her My Chart.  She states she needs a refill on her thyroid meds by her PCP but the dosage might need to be changed based on the results.  Please advise. Thank you Maudie Mercury

## 2018-02-22 ENCOUNTER — Other Ambulatory Visit: Payer: Self-pay | Admitting: Nurse Practitioner

## 2018-02-22 ENCOUNTER — Encounter: Payer: Self-pay | Admitting: Family Medicine

## 2018-02-22 DIAGNOSIS — E038 Other specified hypothyroidism: Secondary | ICD-10-CM

## 2018-02-22 MED ORDER — THYROID 120 MG PO TABS: 120.0000 mg | ORAL_TABLET | Freq: Every day | ORAL | 0 refills | Status: DC

## 2018-02-23 ENCOUNTER — Encounter (INDEPENDENT_AMBULATORY_CARE_PROVIDER_SITE_OTHER): Payer: Self-pay

## 2018-02-23 NOTE — Telephone Encounter (Signed)
Sent the patient a my chart message. [name], [place]

## 2018-03-01 ENCOUNTER — Ambulatory Visit (INDEPENDENT_AMBULATORY_CARE_PROVIDER_SITE_OTHER): Payer: Medicaid Other | Admitting: Family Medicine

## 2018-03-01 VITALS — BP 119/71 | HR 88 | Temp 97.7°F | Ht 61.0 in | Wt 277.0 lb

## 2018-03-01 DIAGNOSIS — R7303 Prediabetes: Secondary | ICD-10-CM

## 2018-03-01 DIAGNOSIS — Z6841 Body Mass Index (BMI) 40.0 and over, adult: Secondary | ICD-10-CM | POA: Diagnosis not present

## 2018-03-01 NOTE — Progress Notes (Signed)
Office: [phone]  /  Fax: [phone]   HPI:   Chief Complaint: OBESITY [name] is here to discuss her progress with her obesity treatment plan. She is on the Category 3 plan and is following her eating plan approximately 50 % of the time. She states she is swimming for 20 minutes 4-5 times per week. [name] is deviating from her plan more, mostly portion control and smarter food choices. She is not planning meal ahead of time as much and eating out a lot more.  Her weight is 277 lb (125.6 kg) today and has had a weight loss of 1 pound over a period of 2 weeks since her last visit. She has lost 15 lbs since starting treatment with [place].  Pre-Diabetes [name] has a diagnosis of pre-diabetes based on her elevated Hgb A1c and was informed this puts her at greater risk of developing diabetes. She is on metformin and she denies nausea, vomiting, or hypoglycemia. She is struggling with simple carbohydrates and cravings, and continues to work on diet and exercise to decrease risk of diabetes.   ALLERGIES: Allergies  Allergen Reactions  . Erythromycin   . Zyrtec [Cetirizine Hcl]     MEDICATIONS: Current Outpatient Medications on File Prior to Visit  Medication Sig Dispense Refill  . acetaminophen (TYLENOL) 325 MG tablet Take 650 mg by mouth every 6 (six) hours as needed.    . citalopram (CELEXA) 20 MG tablet Take 20 mg by mouth daily.      . clonazePAM (KLONOPIN) 0.5 MG tablet Take 0.5 mg by mouth 3 (three) times daily as needed for anxiety.    . diclofenac (VOLTAREN) 75 MG EC tablet Take 75 mg by mouth 2 (two) times daily.    . fluticasone (FLONASE) 50 MCG/ACT nasal spray Place 2 sprays into both nostrils daily. 16 g 6  . gabapentin (NEURONTIN) 100 MG capsule Take 100 mg by mouth 3 (three) times daily.    [name] Kitchen ibuprofen (ADVIL,MOTRIN) 200 MG tablet Take 200 mg by mouth every 6 (six) hours as needed.    . loratadine (CLARITIN) 10 MG tablet Take 1 tablet (10 mg total) by mouth daily. 30 tablet 11    . metFORMIN (GLUCOPHAGE) 500 MG tablet Take 2 tablets (1,000 mg total) by mouth 2 (two) times daily with a meal. (Patient taking differently: Take 500 mg by mouth 2 (two) times daily with a meal. ) 360 tablet 0  . omeprazole (PRILOSEC) 20 MG capsule Take 20 mg by mouth daily.    . predniSONE (DELTASONE) 5 MG tablet Take 1 tablet (5 mg total) by mouth daily with breakfast.    . thyroid (ARMOUR THYROID) 120 MG tablet Take 1 tablet (120 mg total) by mouth daily before breakfast. 90 tablet 0  . VITAMIN D, CHOLECALCIFEROL, PO Take 50 mcg by mouth 2 (two) times daily.    [name] Kitchen zolpidem (AMBIEN) 5 MG tablet Take 10 mg by mouth at bedtime as needed.      No current facility-administered medications on file prior to visit.     PAST MEDICAL HISTORY: Past Medical History:  Diagnosis Date  . Asthma   . Chronic fatigue   . Chronic pain    back and hips  . Fibromyalgia   . High cholesterol   . Insomnia   . Prediabetes   . Scoliosis   . Sleep apnea     PAST SURGICAL HISTORY: Past Surgical History:  Procedure Laterality Date  . ABDOMINAL HYSTERECTOMY    . CESAREAN SECTION    .  CHOLECYSTECTOMY    . SPINAL FUSION    . THYROIDECTOMY    . TONSILLECTOMY    . TUBAL LIGATION      SOCIAL HISTORY: Social History   Tobacco Use  . Smoking status: Former Smoker    Packs/day: 0.25    Years: 12.00    Pack years: 3.00    Types: Cigarettes    Last attempt to quit: 09/07/2004    Years since quitting: 13.4  . Smokeless tobacco: Never Used  Substance Use Topics  . Alcohol use: Yes    Comment: 4 times a year  . Drug use: No    FAMILY HISTORY: Family History  Problem Relation Age of Onset  . Cancer Maternal Uncle   . Heart disease Father   . High Cholesterol Father   . Heart disease Paternal Grandfather   . Breast cancer Maternal Grandmother   . Rheum arthritis Mother   . Obesity Mother   . Rheum arthritis Maternal Grandfather   . Rheum arthritis Maternal Aunt     ROS: Review of Systems   Constitutional: Positive for weight loss.  Gastrointestinal: Negative for nausea and vomiting.  Endo/Heme/Allergies:       Negative hypoglycemia    PHYSICAL EXAM: Blood pressure 119/71, pulse 88, temperature 97.7 F (36.5 C), temperature source Oral, height 5\' 1"  (1.549 m), weight 277 lb (125.6 kg), SpO2 97 %. Body mass index is 52.34 kg/m. Physical Exam  Constitutional: She is oriented to person, place, and time. She appears well-developed and well-nourished.  Cardiovascular: Normal rate.  Pulmonary/Chest: Effort normal.  Musculoskeletal: Normal range of motion.  Neurological: She is oriented to person, place, and time.  Skin: Skin is warm and dry.  Psychiatric: She has a normal mood and affect. Her behavior is normal.  Vitals reviewed.   RECENT LABS AND TESTS: BMET    Component Value Date/Time   NA 142 12/07/2017 1204   K 4.1 12/07/2017 1204   CL 101 12/07/2017 1204   CO2 23 12/07/2017 1204   GLUCOSE 87 12/07/2017 1204   GLUCOSE 90 10/01/2017 1041   BUN 10 12/07/2017 1204   CREATININE 0.61 12/07/2017 1204   CREATININE 0.53 10/01/2017 1041   CALCIUM 9.7 12/07/2017 1204   GFRNONAA 111 12/07/2017 1204   GFRNONAA 116 10/01/2017 1041   GFRAA 128 12/07/2017 1204   GFRAA 135 10/01/2017 1041   Lab Results  Component Value Date   HGBA1C 5.8 (H) 12/07/2017   HGBA1C 6.0 (H) 10/01/2017   Lab Results  Component Value Date   INSULIN 20.1 12/07/2017   CBC    Component Value Date/Time   WBC 7.1 12/07/2017 1204   WBC 7.0 10/01/2017 1041   RBC 5.06 12/07/2017 1204   RBC 5.02 10/01/2017 1041   HGB 14.3 12/07/2017 1204   HCT 42.6 12/07/2017 1204   PLT 244 10/01/2017 1041   MCV 84 12/07/2017 1204   MCH 28.3 12/07/2017 1204   MCH 28.3 10/01/2017 1041   MCHC 33.6 12/07/2017 1204   MCHC 34.5 10/01/2017 1041   RDW 13.3 12/07/2017 1204   LYMPHSABS 2.3 12/07/2017 1204   EOSABS 0.2 12/07/2017 1204   BASOSABS 0.0 12/07/2017 1204   Iron/TIBC/Ferritin/ %Sat No results  found for: IRON, TIBC, FERRITIN, IRONPCTSAT Lipid Panel     Component Value Date/Time   CHOL 148 12/07/2017 1204   TRIG 138 12/07/2017 1204   HDL 53 12/07/2017 1204   CHOLHDL 2.9 10/08/2017 1103   LDLCALC 67 12/07/2017 1204   LDLCALC 69 10/08/2017  1103   Hepatic Function Panel     Component Value Date/Time   PROT 7.4 12/07/2017 1204   ALBUMIN 4.3 12/07/2017 1204   AST 26 12/07/2017 1204   ALT 43 (H) 12/07/2017 1204   ALKPHOS 82 12/07/2017 1204   BILITOT 0.3 12/07/2017 1204      Component Value Date/Time   TSH 1.890 02/15/2018 1137   TSH 0.009 (L) 12/07/2017 1204   TSH 0.01 (L) 10/01/2017 1041    ASSESSMENT AND PLAN: Prediabetes  Class 3 severe obesity with serious comorbidity and body mass index (BMI) of 50.0 to 59.9 in adult, unspecified obesity type ([place])  PLAN:  Pre-Diabetes Makenzee will continue to work on weight loss, diet, exercise, and decreasing simple carbohydrates in her diet to help decrease the risk of diabetes. We dicussed metformin including benefits and risks. She was informed that eating too many simple carbohydrates or too many calories at one sitting increases the likelihood of GI side effects. Jomarie agrees to continue taking metformin and she agrees to follow up with our clinic in 3 weeks as directed to monitor her progress.  We spent > than 50% of the 30 minute visit on the counseling as documented in the note.  Obesity Yarieliz is currently in the action stage of change. As such, her goal is to continue with weight loss efforts She has agreed to keep a food journal with 400-600 calories and 40 grams of protein at lunch daily and follow the Category 3 plan Cybill has been instructed to work up to a goal of 150 minutes of combined cardio and strengthening exercise per week for weight loss and overall health benefits. We discussed the following Behavioral Modification Strategies today: increasing lean protein intake, decreasing simple carbohydrates, decrease  eating out, work on meal planning and easy cooking plans, and no skipping meals   Yanelis has agreed to follow up with our clinic in 3 weeks. She was informed of the importance of frequent follow up visits to maximize her success with intensive lifestyle modifications for her multiple health conditions.   OBESITY BEHAVIORAL INTERVENTION VISIT  Today's visit was # 6 out of 22.  Starting weight: 292 lbs Starting date: 12/07/17 Today's weight : 277 lbs  Today's date: 03/01/2018 Total lbs lost to date: 15 (Patients must lose 7 lbs in the first 6 months to continue with counseling)   ASK: We discussed the diagnosis of obesity with [name] today and Kahlie agreed to give [place] permission to discuss obesity behavioral modification therapy today.  ASSESS: Maitri has the diagnosis of obesity and her BMI today is 52.37 Kenyon is in the action stage of change   ADVISE: Tamieka was educated on the multiple health risks of obesity as well as the benefit of weight loss to improve her health. She was advised of the need for long term treatment and the importance of lifestyle modifications.  AGREE: Multiple dietary modification options and treatment options were discussed and  Berneice agreed to the above obesity treatment plan.  I, Trixie Dredge, am acting as transcriptionist for Dennard Nip, MD  I have reviewed the above documentation for accuracy and completeness, and I agree with the above. -Dennard Nip, MD

## 2018-03-09 ENCOUNTER — Ambulatory Visit: Payer: Medicaid Other | Admitting: Family Medicine

## 2018-03-09 ENCOUNTER — Telehealth: Payer: Self-pay | Admitting: Family Medicine

## 2018-03-09 ENCOUNTER — Encounter: Payer: Self-pay | Admitting: Family Medicine

## 2018-03-09 VITALS — BP 124/76 | HR 96 | Temp 98.0°F | Resp 18 | Ht 61.0 in | Wt 278.4 lb

## 2018-03-09 DIAGNOSIS — G44209 Tension-type headache, unspecified, not intractable: Secondary | ICD-10-CM | POA: Diagnosis not present

## 2018-03-09 DIAGNOSIS — M797 Fibromyalgia: Secondary | ICD-10-CM

## 2018-03-09 DIAGNOSIS — J309 Allergic rhinitis, unspecified: Secondary | ICD-10-CM | POA: Diagnosis not present

## 2018-03-09 DIAGNOSIS — Z23 Encounter for immunization: Secondary | ICD-10-CM

## 2018-03-09 MED ORDER — DICLOFENAC SODIUM 75 MG PO TBEC: 75.0000 mg | DELAYED_RELEASE_TABLET | Freq: Two times a day (BID) | ORAL | 0 refills | Status: DC | PRN

## 2018-03-09 MED ORDER — PREDNISONE 5 MG PO TABS: 5.0000 mg | ORAL_TABLET | Freq: Every day | ORAL | 0 refills | Status: DC

## 2018-03-09 NOTE — Progress Notes (Signed)
Name: [name]   MRN: [id number]    DOB: 1975/12/01   Date:03/09/2018       Progress Note  Subjective  Chief Complaint  Chief Complaint  Patient presents with  . Medication Refill    HPI  Tension Headaches: She notes that tension headaches have "taken over my life" over the last 8 months or so - she has had them her entire life, but they have been worse this past year. She denies aura. Pain is central forehead and wraps around to the temples, bilateral shoulders, and neck.  Has tried a TENS unit, has had family massage and this provides relief, hot shower, has tried goody powders, benadryl, etc. And nothing as helped.  Takes diclofenac daily for fibromyalgia - question if rebound headache could be contributing.  Fibromyalgia: Taking prednisone and diclofenac daily; she never heard from rheumatology and requests that we check on this referral.  We will provide diclofenac and prednisone refill today while we check on this referral.  Allergic Rhinitis: Well controlled on Claritin and flonase; she does notice coughing every now and then and nasal drainage occasionally, but overall symptoms have been well controlled.  Patient Active Problem List   Diagnosis Date Noted  . Other fatigue 12/07/2017  . Shortness of breath on exertion 12/07/2017  . Other specified hypothyroidism 12/07/2017  . Prediabetes 10/15/2017  . Gastroesophageal reflux disease without esophagitis 10/15/2017  . Nasal valve collapse 10/15/2017  . 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/01/2017  . Chronic midline back pain 10/01/2017  . Vitamin D deficiency 10/01/2017  . Anxiety 10/01/2017  . PCOS (polycystic ovarian syndrome) 10/01/2017  . Severe episode of recurrent major depressive disorder, without psychotic features ([place]) 10/01/2017  . BMI 50.0-59.9, adult ([place]) 10/01/2017  . Fibromyalgia 10/01/2017  . Postoperative hypothyroidism 10/01/2017  . OSA (obstructive sleep apnea) 02/26/2014    Past Surgical History:   Procedure Laterality Date  . ABDOMINAL HYSTERECTOMY    . CESAREAN SECTION    . CHOLECYSTECTOMY    . SPINAL FUSION    . THYROIDECTOMY    . TONSILLECTOMY    . TUBAL LIGATION      Family History  Problem Relation Age of Onset  . Cancer Maternal Uncle   . Heart disease Father   . High Cholesterol Father   . Heart disease Paternal Grandfather   . Breast cancer Maternal Grandmother   . Rheum arthritis Mother   . Obesity Mother   . Rheum arthritis Maternal Grandfather   . Rheum arthritis Maternal Aunt     Social History   Socioeconomic History  . Marital status: Married    Spouse name: Eun Vermeer  . Number of children: 3  . Years of education: Not on file  . Highest education level: Not on file  Occupational History  . Occupation: stay at home mother  Social Needs  . Financial resource strain: Not on file  . Food insecurity:    Worry: Not on file    Inability: Not on file  . Transportation needs:    Medical: Not on file    Non-medical: Not on file  Tobacco Use  . Smoking status: Former Smoker    Packs/day: 0.25    Years: 12.00    Pack years: 3.00    Types: Cigarettes    Last attempt to quit: 09/07/2004    Years since quitting: 13.5  . Smokeless tobacco: Never Used  Substance and Sexual Activity  . Alcohol use: Yes    Comment: 4 times  a year  . Drug use: No  . Sexual activity: Yes  Lifestyle  . Physical activity:    Days per week: Not on file    Minutes per session: Not on file  . Stress: Not on file  Relationships  . Social connections:    Talks on phone: Not on file    Gets together: Not on file    Attends religious service: Not on file    Active member of club or organization: Not on file    Attends meetings of clubs or organizations: Not on file    Relationship status: Not on file  . Intimate partner violence:    Fear of current or ex partner: Not on file    Emotionally abused: Not on file    Physically abused: Not on file    Forced sexual activity:  Not on file  Other Topics Concern  . Not on file  Social History Narrative  . Not on file     Current Outpatient Medications:  .  acetaminophen (TYLENOL) 325 MG tablet, Take 650 mg by mouth every 6 (six) hours as needed., Disp: , Rfl:  .  citalopram (CELEXA) 20 MG tablet, Take 20 mg by mouth daily.  , Disp: , Rfl:  .  clonazePAM (KLONOPIN) 0.5 MG tablet, Take 0.5 mg by mouth 3 (three) times daily as needed for anxiety., Disp: , Rfl:  .  diclofenac (VOLTAREN) 75 MG EC tablet, Take 75 mg by mouth 2 (two) times daily., Disp: , Rfl:  .  fluticasone (FLONASE) 50 MCG/ACT nasal spray, Place 2 sprays into both nostrils daily., Disp: 16 g, Rfl: 6 .  gabapentin (NEURONTIN) 100 MG capsule, Take 100 mg by mouth 3 (three) times daily., Disp: , Rfl:  .  ibuprofen (ADVIL,MOTRIN) 200 MG tablet, Take 200 mg by mouth every 6 (six) hours as needed., Disp: , Rfl:  .  loratadine (CLARITIN) 10 MG tablet, Take 1 tablet (10 mg total) by mouth daily., Disp: 30 tablet, Rfl: 11 .  metFORMIN (GLUCOPHAGE) 500 MG tablet, Take 2 tablets (1,000 mg total) by mouth 2 (two) times daily with a meal. (Patient taking differently: Take 500 mg by mouth 2 (two) times daily with a meal. ), Disp: 360 tablet, Rfl: 0 .  omeprazole (PRILOSEC) 20 MG capsule, Take 20 mg by mouth daily., Disp: , Rfl:  .  predniSONE (DELTASONE) 5 MG tablet, Take 1 tablet (5 mg total) by mouth daily with breakfast., Disp: , Rfl:  .  thyroid (ARMOUR THYROID) 120 MG tablet, Take 1 tablet (120 mg total) by mouth daily before breakfast., Disp: 90 tablet, Rfl: 0 .  VITAMIN D, CHOLECALCIFEROL, PO, Take 50 mcg by mouth 2 (two) times daily., Disp: , Rfl:  .  zolpidem (AMBIEN) 5 MG tablet, Take 10 mg by mouth at bedtime as needed. , Disp: , Rfl:   Allergies  Allergen Reactions  . Erythromycin   . Zyrtec [Cetirizine Hcl]     ROS  Constitutional: Negative for fever or weight change.  Respiratory: Negative for cough and shortness of breath.   Cardiovascular:  Negative for chest pain or palpitations.  Gastrointestinal: Negative for abdominal pain, no bowel changes.  Musculoskeletal: Negative for gait problem or joint swelling. She notes some aches related to fibromyalgia, but overall feeling well.  Skin: Negative for rash.  Neurological: Negative for dizziness; positive for headaches. No other specific complaints in a complete review of systems (except as listed in HPI above).  Objective  Vitals:  03/09/18 1120  BP: 124/76  Pulse: 96  Resp: 18  Temp: 98 F (36.7 C)  TempSrc: Oral  SpO2: 94%  Weight: 278 lb 6.4 oz (126.3 kg)  Height: 5\' 1"  (1.549 m)   Body mass index is 52.6 kg/m.  Physical Exam Constitutional: Patient appears well-developed and well-nourished. No distress.  HENT: Head: Normocephalic and atraumatic. Eyes: Conjunctivae and EOM are normal. Pupils are equal, round, and reactive to light. No scleral icterus.  Neck: Normal range of motion. Neck supple. No JVD present. No thyromegaly present.  Cardiovascular: Normal rate, regular rhythm and normal heart sounds.  No murmur heard. No BLE edema. Pulmonary/Chest: Effort normal and breath sounds normal. No respiratory distress. Musculoskeletal: Normal range of motion, no joint effusions. No gross deformities Neurological: she is alert and oriented to person, place, and time. No gross cranial nerve deficit. Coordination, balance, strength, speech and gait are normal.  Skin: Skin is warm and dry. No rash noted. No erythema.  Psychiatric: Patient has a normal mood and affect. behavior is normal. Judgment and thought content normal.  No results found for this or any previous visit (from the past 72 hour(s)).  PHQ2/9: Depression screen [facility] 2/9 03/09/2018 12/07/2017 10/01/2017  Decreased Interest 3 3 3   Down, Depressed, Hopeless 3 3 3   PHQ - 2 Score 6 6 6   Altered sleeping 2 3 2   Tired, decreased energy 2 3 3   Change in appetite 1 3 2   Feeling bad or failure about yourself  2 3 3    Trouble concentrating 2 2 3   Moving slowly or fidgety/restless 0 3 3  Suicidal thoughts 0 2 0  PHQ-9 Score - 25 22  Difficult doing work/chores Somewhat difficult Extremely dIfficult Extremely dIfficult  Patient is seeing psychiatry and psychology for management. Denies SI/HI  Fall Risk: Fall Risk  03/09/2018 10/01/2017  Falls in the past year? No No    Assessment & Plan  1. Tension headache - Discussed management with medication, she is taking NSAID daily and I question rebound headache, however the NSAID is for fibromyalgia management, so I will maintain dosing until rheumatology is able to determine if diclofenac needs to be maintained..  Pt would like to try Physical therapy to help with neck/shoulder tension. - Discussed exercises to perform while awaiting PT referral in detail.  Also advised using moist head on neck/shoulders and massage if possible.  Recommend against additional NSAID use.  She does not describe migraine with aura, so we will avoid a daily preventative/CGRPR antagonists at this time. - Ambulatory referral to Physical Therapy  2. Fibromyalgia - We will check on referral - message is sent to ensure this is follow up on. Advised to call in 1-2 weeks if she has not heard back - diclofenac (VOLTAREN) 75 MG EC tablet; Take 1 tablet (75 mg total) by mouth 2 (two) times daily as needed.  Dispense: 90 tablet; Refill: 0 - predniSONE (DELTASONE) 5 MG tablet; Take 1 tablet (5 mg total) by mouth daily with breakfast.  Dispense: 90 tablet; Refill: 0  3. Need for Tdap vaccination - Tdap vaccine greater than or equal to 7yo IM  4. Allergic rhinitis, unspecified seasonality, unspecified trigger - Maintain claritin and flonase.

## 2018-03-09 NOTE — Telephone Encounter (Signed)
Could you check on this. Thanks. 

## 2018-03-09 NOTE — Telephone Encounter (Signed)
Please check on Rheumatology referral - [name] has not heard back from them and would like to see them for her her fibromyalgia management.

## 2018-03-09 NOTE — Telephone Encounter (Signed)
Patient here for appointment, please advise

## 2018-03-09 NOTE — Patient Instructions (Signed)
Tension Headache A tension headache is pain, pressure, or aching that is felt over the front and sides of your head. These headaches can last from 30 minutes to several days. Follow these instructions at home: Managing pain  Take over-the-counter and prescription medicines only as told by your doctor.  Lie down in a dark, quiet room when you have a headache.  If directed, apply ice to your head and neck area: ? Put ice in a plastic bag. ? Place a towel between your skin and the bag. ? Leave the ice on for 20 minutes, 2-3 times per day.  Use a heating pad or a hot shower to apply heat to your head and neck area as told by your doctor. Eating and drinking  Eat meals on a regular schedule.  Do not drink a lot of alcohol.  Do not use a lot of caffeine, or stop using caffeine. General instructions  Keep all follow-up visits as told by your doctor. This is important.  Keep a journal to find out if certain things bring on headaches. For example, write down: ? What you eat and drink. ? How much sleep you get. ? Any change to your diet or medicines.  Try getting a massage, or doing other things that help you to relax.  Lessen stress.  Sit up straight. Do not tighten (tense) your muscles.  Do not use tobacco products. This includes cigarettes, chewing tobacco, or e-cigarettes. If you need help quitting, ask your doctor.  Exercise regularly as told by your doctor.  Get enough sleep. This may mean 7-9 hours of sleep. Contact a doctor if:  Your symptoms are not helped by medicine.  You have a headache that feels different from your usual headache.  You feel sick to your stomach (nauseous) or you throw up (vomit).  You have a fever. Get help right away if:  Your headache becomes very bad.  You keep throwing up.  You have a stiff neck.  You have trouble seeing.  You have trouble speaking.  You have pain in your eye or ear.  Your muscles are weak or you lose muscle  control.  You lose your balance or you have trouble walking.  You feel like you will pass out (faint) or you pass out.  You have confusion. This information is not intended to replace advice given to you by your health care provider. Make sure you discuss any questions you have with your health care provider. Document Released: 11/18/2009 Document Revised: 04/23/2016 Document Reviewed: 12/17/2014 Elsevier Interactive Patient Education  2018 [employer]  

## 2018-03-22 ENCOUNTER — Other Ambulatory Visit: Payer: Self-pay | Admitting: Family Medicine

## 2018-03-22 DIAGNOSIS — E282 Polycystic ovarian syndrome: Secondary | ICD-10-CM

## 2018-03-22 DIAGNOSIS — R7303 Prediabetes: Secondary | ICD-10-CM

## 2018-03-23 ENCOUNTER — Ambulatory Visit (INDEPENDENT_AMBULATORY_CARE_PROVIDER_SITE_OTHER): Payer: Medicaid Other | Admitting: Family Medicine

## 2018-03-23 VITALS — BP 115/77 | HR 73 | Temp 98.1°F | Ht 61.0 in | Wt 271.0 lb

## 2018-03-23 DIAGNOSIS — R7303 Prediabetes: Secondary | ICD-10-CM

## 2018-03-23 DIAGNOSIS — Z6841 Body Mass Index (BMI) 40.0 and over, adult: Secondary | ICD-10-CM | POA: Diagnosis not present

## 2018-03-23 MED ORDER — METFORMIN HCL 500 MG PO TABS: 500.0000 mg | ORAL_TABLET | Freq: Two times a day (BID) | ORAL | 0 refills | Status: DC

## 2018-03-23 NOTE — Progress Notes (Signed)
Office: [phone]  /  Fax: [phone]   HPI:   Chief Complaint: OBESITY [name] is here to discuss her progress with her obesity treatment plan. She is on the keep a food journal with 400-600 calories and 40 grams of protein at lunch and follow the Category 3 plan and is following her eating plan approximately 80 % of the time. She states she is doing pool exercises for 15-20 minutes 3 times per week. [name] continues to do well with weight loss on Category 3 plan and journaling for lunch. She has had extra challenges with her air conditioner going out and has increased eating out but has been mindful of her choices.  Her weight is 271 lb (122.9 kg) today and has had a weight loss of 6 pounds over a period of 3 weeks since her last visit. She has lost 21 lbs since starting treatment with [place].  Pre-Diabetes [name] has a diagnosis of pre-diabetes based on her elevated Hgb A1c and was informed this puts her at greater risk of developing diabetes. She is taking metformin 500 mg BID with decreased GI upset and is doing very well on her diet prescription. She continues to work on diet and exercise to decrease risk of diabetes. She denies nausea or hypoglycemia.  ALLERGIES: Allergies  Allergen Reactions  . Erythromycin   . Zyrtec [Cetirizine Hcl]     MEDICATIONS: Current Outpatient Medications on File Prior to Visit  Medication Sig Dispense Refill  . acetaminophen (TYLENOL) 325 MG tablet Take 650 mg by mouth every 6 (six) hours as needed.    . citalopram (CELEXA) 20 MG tablet Take 20 mg by mouth daily.      . clonazePAM (KLONOPIN) 0.5 MG tablet Take 0.5 mg by mouth 3 (three) times daily as needed for anxiety.    . diclofenac (VOLTAREN) 75 MG EC tablet Take 1 tablet (75 mg total) by mouth 2 (two) times daily as needed. 90 tablet 0  . fluticasone (FLONASE) 50 MCG/ACT nasal spray Place 2 sprays into both nostrils daily. 16 g 6  . gabapentin (NEURONTIN) 100 MG capsule Take 100 mg by mouth 3  (three) times daily.    [name] Kitchen ibuprofen (ADVIL,MOTRIN) 200 MG tablet Take 200 mg by mouth every 6 (six) hours as needed.    . loratadine (CLARITIN) 10 MG tablet Take 1 tablet (10 mg total) by mouth daily. 30 tablet 11  . omeprazole (PRILOSEC) 20 MG capsule Take 20 mg by mouth daily.    . predniSONE (DELTASONE) 5 MG tablet Take 1 tablet (5 mg total) by mouth daily with breakfast. 90 tablet 0  . thyroid (ARMOUR THYROID) 120 MG tablet Take 1 tablet (120 mg total) by mouth daily before breakfast. 90 tablet 0  . VITAMIN D, CHOLECALCIFEROL, PO Take 50 mcg by mouth 2 (two) times daily.    [name] Kitchen zolpidem (AMBIEN) 5 MG tablet Take 10 mg by mouth at bedtime as needed.      No current facility-administered medications on file prior to visit.     PAST MEDICAL HISTORY: Past Medical History:  Diagnosis Date  . Asthma   . Chronic fatigue   . Chronic pain    back and hips  . Fibromyalgia   . High cholesterol   . Insomnia   . Prediabetes   . Scoliosis   . Sleep apnea     PAST SURGICAL HISTORY: Past Surgical History:  Procedure Laterality Date  . ABDOMINAL HYSTERECTOMY    . CESAREAN SECTION    .  CHOLECYSTECTOMY    . SPINAL FUSION    . THYROIDECTOMY    . TONSILLECTOMY    . TUBAL LIGATION      SOCIAL HISTORY: Social History   Tobacco Use  . Smoking status: Former Smoker    Packs/day: 0.25    Years: 12.00    Pack years: 3.00    Types: Cigarettes    Last attempt to quit: 09/07/2004    Years since quitting: 13.5  . Smokeless tobacco: Never Used  Substance Use Topics  . Alcohol use: Yes    Comment: 4 times a year  . Drug use: No    FAMILY HISTORY: Family History  Problem Relation Age of Onset  . Cancer Maternal Uncle   . Heart disease Father   . High Cholesterol Father   . Heart disease Paternal Grandfather   . Breast cancer Maternal Grandmother   . Rheum arthritis Mother   . Obesity Mother   . Rheum arthritis Maternal Grandfather   . Rheum arthritis Maternal Aunt      ROS: Review of Systems  Constitutional: Positive for weight loss.  Gastrointestinal: Negative for nausea.  Endo/Heme/Allergies:       Negative hypoglycemia    PHYSICAL EXAM: Blood pressure 115/77, pulse 73, temperature 98.1 F (36.7 C), temperature source Oral, height 5\' 1"  (1.549 m), weight 271 lb (122.9 kg), SpO2 96 %. Body mass index is 51.21 kg/m. Physical Exam  Constitutional: She is oriented to person, place, and time. She appears well-developed and well-nourished.  Cardiovascular: Normal rate.  Pulmonary/Chest: Effort normal.  Musculoskeletal: Normal range of motion.  Neurological: She is oriented to person, place, and time.  Skin: Skin is warm and dry.  Psychiatric: She has a normal mood and affect. Her behavior is normal.  Vitals reviewed.   RECENT LABS AND TESTS: BMET    Component Value Date/Time   NA 142 12/07/2017 1204   K 4.1 12/07/2017 1204   CL 101 12/07/2017 1204   CO2 23 12/07/2017 1204   GLUCOSE 87 12/07/2017 1204   GLUCOSE 90 10/01/2017 1041   BUN 10 12/07/2017 1204   CREATININE 0.61 12/07/2017 1204   CREATININE 0.53 10/01/2017 1041   CALCIUM 9.7 12/07/2017 1204   GFRNONAA 111 12/07/2017 1204   GFRNONAA 116 10/01/2017 1041   GFRAA 128 12/07/2017 1204   GFRAA 135 10/01/2017 1041   Lab Results  Component Value Date   HGBA1C 5.8 (H) 12/07/2017   HGBA1C 6.0 (H) 10/01/2017   Lab Results  Component Value Date   INSULIN 20.1 12/07/2017   CBC    Component Value Date/Time   WBC 7.1 12/07/2017 1204   WBC 7.0 10/01/2017 1041   RBC 5.06 12/07/2017 1204   RBC 5.02 10/01/2017 1041   HGB 14.3 12/07/2017 1204   HCT 42.6 12/07/2017 1204   PLT 244 10/01/2017 1041   MCV 84 12/07/2017 1204   MCH 28.3 12/07/2017 1204   MCH 28.3 10/01/2017 1041   MCHC 33.6 12/07/2017 1204   MCHC 34.5 10/01/2017 1041   RDW 13.3 12/07/2017 1204   LYMPHSABS 2.3 12/07/2017 1204   EOSABS 0.2 12/07/2017 1204   BASOSABS 0.0 12/07/2017 1204   Iron/TIBC/Ferritin/  %Sat No results found for: IRON, TIBC, FERRITIN, IRONPCTSAT Lipid Panel     Component Value Date/Time   CHOL 148 12/07/2017 1204   TRIG 138 12/07/2017 1204   HDL 53 12/07/2017 1204   CHOLHDL 2.9 10/08/2017 1103   LDLCALC 67 12/07/2017 1204   LDLCALC 69 10/08/2017 1103  Hepatic Function Panel     Component Value Date/Time   PROT 7.4 12/07/2017 1204   ALBUMIN 4.3 12/07/2017 1204   AST 26 12/07/2017 1204   ALT 43 (H) 12/07/2017 1204   ALKPHOS 82 12/07/2017 1204   BILITOT 0.3 12/07/2017 1204      Component Value Date/Time   TSH 1.890 02/15/2018 1137   TSH 0.009 (L) 12/07/2017 1204   TSH 0.01 (L) 10/01/2017 1041    ASSESSMENT AND PLAN: Prediabetes - Plan: metFORMIN (GLUCOPHAGE) 500 MG tablet  Class 3 severe obesity with serious comorbidity and body mass index (BMI) of 50.0 to 59.9 in adult, unspecified obesity type ([place])  PLAN:  Pre-Diabetes [name] will continue to work on weight loss, exercise, and decreasing simple carbohydrates in her diet to help decrease the risk of diabetes. We dicussed metformin including benefits and risks. She was informed that eating too many simple carbohydrates or too many calories at one sitting increases the likelihood of GI side effects. [name] agrees to continue taking metformin 500 mg BID #60 and we will refill for 1 month. [name] agrees to follow up with our clinic in 2 to 3 weeks as directed to monitor her progress.  Obesity [name] is currently in the action stage of change. As such, her goal is to continue with weight loss efforts She has agreed to change to keep a food journal with 1600 calories and 100+ grams of protein daily [name] has been instructed to work up to a goal of 150 minutes of combined cardio and strengthening exercise per week for weight loss and overall health benefits. We discussed the following Behavioral Modification Strategies today: increasing lean protein intake, decreasing simple carbohydrates, work on meal planning and  easy cooking plans, and keeping a strict food journal   [name] has agreed to follow up with our clinic in 2 to 3 weeks. She was informed of the importance of frequent follow up visits to maximize her success with intensive lifestyle modifications for her multiple health conditions.   OBESITY BEHAVIORAL INTERVENTION VISIT  Today's visit was # 7 out of 22.  Starting weight: 292 lbs Starting date: 12/07/17 Today's weight : 271 lbs  Today's date: 03/23/2018 Total lbs lost to date: 21    ASK: We discussed the diagnosis of obesity with [name] today and [name] agreed to give [place] permission to discuss obesity behavioral modification therapy today.  ASSESS: [name] has the diagnosis of obesity and her BMI today is 51.23 [name] is in the action stage of change   ADVISE: [name] was educated on the multiple health risks of obesity as well as the benefit of weight loss to improve her health. She was advised of the need for long term treatment and the importance of lifestyle modifications.  AGREE: Multiple dietary modification options and treatment options were discussed and  [name] agreed to the above obesity treatment plan.  I, Trixie Dredge, am acting as transcriptionist for Dennard Nip, MD  I have reviewed the above documentation for accuracy and completeness, and I agree with the above. -Dennard Nip, MD

## 2018-03-24 ENCOUNTER — Other Ambulatory Visit: Payer: Self-pay

## 2018-03-24 ENCOUNTER — Ambulatory Visit: Payer: Medicaid Other | Attending: Family Medicine | Admitting: Physical Therapy

## 2018-03-24 DIAGNOSIS — R2689 Other abnormalities of gait and mobility: Secondary | ICD-10-CM | POA: Insufficient documentation

## 2018-03-24 DIAGNOSIS — M4125 Other idiopathic scoliosis, thoracolumbar region: Secondary | ICD-10-CM | POA: Insufficient documentation

## 2018-03-24 DIAGNOSIS — M4123 Other idiopathic scoliosis, cervicothoracic region: Secondary | ICD-10-CM | POA: Diagnosis present

## 2018-03-24 DIAGNOSIS — M791 Myalgia, unspecified site: Secondary | ICD-10-CM | POA: Insufficient documentation

## 2018-03-24 NOTE — Patient Instructions (Addendum)
Stretch for R midback :  Open book while lying on the L side , turning R ( handout)  15 reps x 3 x day  And  after activities in seated position   Stretch for the L neck: On your back Turn R ear to shoulder like holding a phone Pull L shoulder down, like shooting laser out of fingers towards feet tuck chin, turn to L and look at upper left corner of room  10 reps x 3 x day  And  after activities in seated position  __  Sleeping on back:  fold hand wash towel into small triangle and place under R low back to fill in the arch     LOG ROLL to sidelying before getting out of bed   ___ Wear shoe lift in L shoe Wear more proper shoes with sole support  ___  Sit with feet on the ground, less slouching Stand with soft knees

## 2018-03-24 NOTE — Therapy (Signed)
[place] [facility]IN [facility] SERVICES [address] [place], [place], [address] Phone: [phone]   Fax:  [phone]  Physical Therapy Evaluation  Patient Details  Name: [name] MRN: [id number] Date of Birth: [birth year] Referring Provider: Raelyn Ensign, MD    Encounter Date: 03/24/2018  PT End of Session - 03/24/18 1315    Visit Number  1    Number of Visits  12    Date for PT Re-Evaluation  06/16/18    PT Start Time  6789    PT Stop Time  1413    PT Time Calculation (min)  58 min       Past Medical History:  Diagnosis Date  . Asthma   . Chronic fatigue   . Chronic pain    back and hips  . Fibromyalgia   . High cholesterol   . Insomnia   . Prediabetes   . Scoliosis   . Sleep apnea     Past Surgical History:  Procedure Laterality Date  . ABDOMINAL HYSTERECTOMY    . CESAREAN SECTION    . CHOLECYSTECTOMY    . SPINAL FUSION    . THYROIDECTOMY    . TONSILLECTOMY    . TUBAL LIGATION      There were no vitals filed for this visit.   Subjective Assessment - 03/24/18 1315    Subjective  1) neck pain and headaches: Pt reports noticing her headaches started to 2 years and now has become progressively more frequent. It is like a band over forehead and can feel everything down the back of her neck and shoulders. Pain is 7-8/10 without radiating down arms.  The headaches remain constant for 3-4 days.  Pt takes OTC medications but they do not help. When someone massages the muscles over the shoulders, pt gets an instant relief until the end of the massage. Denied vision changes nor dizziness with headaches. Pt has scoliosis as a child.  Hx of 1st midback fusion when pt was 42 years old in 1986, 2nd fusion from below midback to tailbone occurred 13 years ago in 2004.  Thyoid removal 2004 .  Vaginal hysterectomy.  C-section with twins. Dx of fibromyalgia with pain in her B arms to hands.  Headaches cause her pt lay on the couch, can not sleep,  do housework, drive.   2) LBP 5/10 level of pain for "as long as I can remember". LBP prevents her from walking, shopping, and going on field trips with her children, and household chores. Pt is working with a weight loss specialist. Pt would like to return to exercising swimming, walking, bike riding. Radiating pain down on B posterior mid thigh. R foot gets burning and stingling.        Pertinent History  2 vaginal deliveries, 2 twin via C-section     Currently in Pain?  Yes    Pain Score  2     Pain Location  Neck    Effect of Pain on Daily Activities  live everyday without pain, walk grocery shop          [facility] PT Assessment - 03/24/18 1336      Assessment   Medical Diagnosis  headaches     Referring Provider  Raelyn Ensign, MD       Precautions   Precautions  None      Restrictions   Weight Bearing Restrictions  No      Balance Screen   Has the patient fallen  in the past 6 months  No      Observation/Other Assessments   Observations  ankles crossed, L shoulder lower, convex curves:  L cervical  R thoraco, L lumbar curve with L posterior rotation of lumbar, hyperextended knees, R shoulder rotated forward       AROM   Overall AROM Comments  R sidebend from nailbed of digit III to floor 49 cm, L 56 cm. (Post Tx: R sidebend 55 cm)  R rotation ~25 %  and L 45 % ( post Tx R rotation ~45%       Strength   Overall Strength Comments  hip / knee flex, knee ext: B 4/5, hip abd R 3/5       Palpation   Spinal mobility  Pain with end range L cervical sidebend ( postTx no pain) , increased tightness along interspinal mm of cervical segments, R medial scapula     SI assessment   L ASIS higher > R, 81 cm on R malleoli to ASIS, L malleoli to L ASIS on L     Palpation comment  multiple scars along her spine with severe scar restrictions, minor restrictions over abdomen      Bed Mobility   Bed Mobility  -- pain/ difficulty with bridging      Ambulation/Gait   Gait Comments  decreased L  stance,                 Objective measurements completed on examination: See above findings.      [facility] Adult PT Treatment/Exercise - 03/24/18 1336      Neuro Re-ed    Neuro Re-ed Details   see pt instructions       Manual Therapy   Manual therapy comments  STM along R medial scapula area  with MWM in open book to R only,  STM along L interspinal cervical                  PT Long Term Goals - 03/24/18 1325      PT LONG TERM GOAL #1   Title  Pt will report being able to sleep on her R side without waking up with a HA across 2-3 nights / week for 1 week in order improve sleep quality     Baseline  HA upon waking    Time  8    Period  Weeks    Status  New    Target Date  05/19/18      PT LONG TERM GOAL #2   Title  Pt will be able to lie on her L side with L LBP < 2/10 in order to improve sleep     Baseline  LBP at 6/10 when lying on L side     Time  8    Period  Weeks    Status  New    Target Date  05/19/18      PT LONG TERM GOAL #3   Title  Pt will decrease ODI score <22 % in order to go on field trips with children      Baseline  32%     Time  12    Period  Weeks    Status  New    Target Date  06/16/18      PT LONG TERM GOAL #4   Title  R sidebend of spine will increase (from nailbed of digit III to floor  56 cm) and R rotation 45%  Baseline  R sidebend from nailbed of digit III to floor 49 cm, L 56 cm.  R rotation ~25 %     Time  2    Period  Weeks    Status  New    Target Date  04/07/18      PT LONG TERM GOAL #5   Title  Pt will demo no cervical mm tightness of L , thoracic tightness of R interspinals in order to perform household chores and walk     Time  6    Period  Weeks    Status  New    Target Date  05/05/18      Additional Long Term Goals   Additional Long Term Goals  Yes      PT LONG TERM GOAL #6   Title  Pt will decrease her NDI score to < 27% in order to drive and perform ADLs    Baseline  37%     Time  12    Period   Weeks    Status  New    Target Date  06/16/18             Plan - 03/24/18 1413    Clinical Impression Statement  Pt is a 42 yo female who reports chronic headaches with R radiaiting arm pain and B chronic LBP with R radiating pain down B posterior mid thigh. These deficitsimpact her ADLs and QOL. Pt's clinical presentations include: _scoliosis ( L cervical, R thoracic, L posterior rotation), _leg length difference _deep core/ hip weakness _poor body mechanics with functional activities and fitness exercises.    Following Tx today, pt demo'd more equally aligned pelvis with shoe lift placed in L shoe and demo'd proper body mechanics to minimize downward forces onto her pelvic floor. Pt tolerated manual Tx to release mm tensions along L cervical and thoracic interspinal/ paraspinal mm. Pt reported feeling less tensions in these areas. Pt's gait improved with shoe lift in L shoe.        History and Personal Factors relevant to plan of care:  scoliosis, multiple surgeries over abdomen, spine, overweight     Clinical Presentation  Evolving    Clinical Decision Making  High    Rehab Potential  Good    PT Frequency  1x / week    PT Treatment/Interventions  Manual lymph drainage;Energy conservation;Scar mobilization;Taping;Neuromuscular re-education;Balance training;Therapeutic exercise;Therapeutic activities;Patient/family education;Aquatic Therapy;ADLs/Self Care Home Management;Gait training;Stair training;Electrical Stimulation    Consulted and Agree with Plan of Care  Patient       Patient will benefit from skilled therapeutic intervention in order to improve the following deficits and impairments:  Decreased scar mobility, Decreased range of motion, Decreased strength, Decreased safety awareness, Decreased activity tolerance, Increased muscle spasms, Improper body mechanics, Increased fascial restricitons, Hypermobility, Postural dysfunction, Pain, Difficulty walking, Decreased  endurance, Decreased balance  Visit Diagnosis: Myalgia  Other idiopathic scoliosis, thoracolumbar region  Other idiopathic scoliosis, cervicothoracic region  Other abnormalities of gait and mobility     Problem List Patient Active Problem List   Diagnosis Date Noted  . Allergic rhinitis 03/09/2018  . Tension headache 03/09/2018  . Other fatigue 12/07/2017  . Shortness of breath on exertion 12/07/2017  . Other specified hypothyroidism 12/07/2017  . Prediabetes 10/15/2017  . Gastroesophageal reflux disease without esophagitis 10/15/2017  . Nasal valve collapse 10/15/2017  . 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/01/2017  . Chronic midline back pain 10/01/2017  . Vitamin D deficiency 10/01/2017  . Anxiety 10/01/2017  .  PCOS (polycystic ovarian syndrome) 10/01/2017  . Severe episode of recurrent major depressive disorder, without psychotic features ([place]) 10/01/2017  . BMI 50.0-59.9, adult ([place]) 10/01/2017  . Fibromyalgia 10/01/2017  . Postoperative hypothyroidism 10/01/2017  . OSA (obstructive sleep apnea) 02/26/2014    Jerl Mina ,PT, DPT, E-RYT  03/24/2018, 2:58 PM  [place] MAIN [facility] SERVICES [address] [place], [place], [address] Phone: [phone]   Fax:  [phone]  Name: [name] MRN: [id number] Date of Birth: 1975/11/18

## 2018-03-31 ENCOUNTER — Ambulatory Visit: Payer: Medicaid Other | Admitting: Physical Therapy

## 2018-04-05 ENCOUNTER — Ambulatory Visit: Payer: Medicaid Other | Admitting: Physical Therapy

## 2018-04-07 ENCOUNTER — Ambulatory Visit: Payer: Medicaid Other | Attending: Family Medicine | Admitting: Physical Therapy

## 2018-04-07 DIAGNOSIS — M4125 Other idiopathic scoliosis, thoracolumbar region: Secondary | ICD-10-CM | POA: Insufficient documentation

## 2018-04-07 DIAGNOSIS — M4123 Other idiopathic scoliosis, cervicothoracic region: Secondary | ICD-10-CM | POA: Insufficient documentation

## 2018-04-07 DIAGNOSIS — M791 Myalgia, unspecified site: Secondary | ICD-10-CM | POA: Insufficient documentation

## 2018-04-07 DIAGNOSIS — R2689 Other abnormalities of gait and mobility: Secondary | ICD-10-CM | POA: Insufficient documentation

## 2018-04-11 ENCOUNTER — Ambulatory Visit (INDEPENDENT_AMBULATORY_CARE_PROVIDER_SITE_OTHER): Payer: Medicaid Other | Admitting: Family Medicine

## 2018-04-11 VITALS — BP 115/78 | HR 88 | Temp 97.8°F | Ht 61.0 in | Wt 269.0 lb

## 2018-04-11 DIAGNOSIS — F418 Other specified anxiety disorders: Secondary | ICD-10-CM | POA: Diagnosis not present

## 2018-04-11 DIAGNOSIS — R7303 Prediabetes: Secondary | ICD-10-CM | POA: Diagnosis not present

## 2018-04-11 DIAGNOSIS — Z6841 Body Mass Index (BMI) 40.0 and over, adult: Secondary | ICD-10-CM | POA: Diagnosis not present

## 2018-04-12 NOTE — Progress Notes (Signed)
Office: [phone]  /  Fax: [phone]   HPI:   Chief Complaint: OBESITY [name] is here to discuss her progress with her obesity treatment plan. She is on the keep a food journal with 1600 calories and 100+ grams of protein daily and is following her eating plan approximately 80 % of the time. She states she is exercising 0 minutes 0 times per week. Crimson continues to do well with weight loss but seems frustrated she can't see her weight loss. She was on vacation and struggled more than she anticipated but has gotten back on track.  Her weight is 269 lb (122 kg) today and has had a weight loss of 2 pounds over a period of 2 to 3 weeks since her last visit. She has lost 23 lbs since starting treatment with [place].  Pre-Diabetes [name] has a diagnosis of pre-diabetes based on her elevated Hgb A1c and was informed this puts her at greater risk of developing diabetes. She is stable on metformin, doing well on diet overall but had increased simple carbohydrates on vacation. She continues to work on diet and exercise to decrease risk of diabetes. She denies nausea or hypoglycemia.  Depression with Anxiety [name] is on Celexa and Klonopin. She is frustrated that she can not see a difference in her size with 20+ lb weight loss. her waist was measured 3" smaller. She is actually on track to lose >10 % total body weight in less than 6 months. [name] struggles with emotional eating and using food for comfort to the extent that it is negatively impacting her health. She often snacks when she is not hungry. [name] sometimes feels she is out of control and then feels guilty that she made poor food choices. She has been working on behavior modification techniques to help reduce her emotional eating and has been somewhat successful. She shows no sign of suicidal or homicidal ideations.  Depression screen [facility] 2/9 03/09/2018 12/07/2017 10/01/2017  Decreased Interest 3 3 3   Down, Depressed, Hopeless 3 3 3   PHQ - 2 Score 6  6 6   Altered sleeping 2 3 2   Tired, decreased energy 2 3 3   Change in appetite 1 3 2   Feeling bad or failure about yourself  2 3 3   Trouble concentrating 2 2 3   Moving slowly or fidgety/restless 0 3 3  Suicidal thoughts 0 2 0  PHQ-9 Score - 25 22  Difficult doing work/chores Somewhat difficult Extremely dIfficult Extremely dIfficult    ALLERGIES: Allergies  Allergen Reactions  . Erythromycin   . Zyrtec [Cetirizine Hcl]     MEDICATIONS: Current Outpatient Medications on File Prior to Visit  Medication Sig Dispense Refill  . acetaminophen (TYLENOL) 325 MG tablet Take 650 mg by mouth every 6 (six) hours as needed.    . citalopram (CELEXA) 20 MG tablet Take 20 mg by mouth daily.      . clonazePAM (KLONOPIN) 0.5 MG tablet Take 0.5 mg by mouth 3 (three) times daily as needed for anxiety.    . diclofenac (VOLTAREN) 75 MG EC tablet Take 1 tablet (75 mg total) by mouth 2 (two) times daily as needed. 90 tablet 0  . fluticasone (FLONASE) 50 MCG/ACT nasal spray Place 2 sprays into both nostrils daily. 16 g 6  . gabapentin (NEURONTIN) 100 MG capsule Take 100 mg by mouth 3 (three) times daily.    [name] Kitchen ibuprofen (ADVIL,MOTRIN) 200 MG tablet Take 200 mg by mouth every 6 (six) hours as needed.    [name] Kitchen  loratadine (CLARITIN) 10 MG tablet Take 1 tablet (10 mg total) by mouth daily. 30 tablet 11  . metFORMIN (GLUCOPHAGE) 500 MG tablet Take 1 tablet (500 mg total) by mouth 2 (two) times daily with a meal. 180 tablet 0  . omeprazole (PRILOSEC) 20 MG capsule Take 20 mg by mouth daily.    . predniSONE (DELTASONE) 5 MG tablet Take 1 tablet (5 mg total) by mouth daily with breakfast. 90 tablet 0  . thyroid (ARMOUR THYROID) 120 MG tablet Take 1 tablet (120 mg total) by mouth daily before breakfast. 90 tablet 0  . VITAMIN D, CHOLECALCIFEROL, PO Take 50 mcg by mouth 2 (two) times daily.    [name] Kitchen zolpidem (AMBIEN) 5 MG tablet Take 10 mg by mouth at bedtime as needed.      No current facility-administered medications on  file prior to visit.     PAST MEDICAL HISTORY: Past Medical History:  Diagnosis Date  . Asthma   . Chronic fatigue   . Chronic pain    back and hips  . Fibromyalgia   . High cholesterol   . Insomnia   . Prediabetes   . Scoliosis   . Sleep apnea     PAST SURGICAL HISTORY: Past Surgical History:  Procedure Laterality Date  . ABDOMINAL HYSTERECTOMY    . CESAREAN SECTION    . CHOLECYSTECTOMY    . SPINAL FUSION    . THYROIDECTOMY    . TONSILLECTOMY    . TUBAL LIGATION      SOCIAL HISTORY: Social History   Tobacco Use  . Smoking status: Former Smoker    Packs/day: 0.25    Years: 12.00    Pack years: 3.00    Types: Cigarettes    Last attempt to quit: 09/07/2004    Years since quitting: 13.6  . Smokeless tobacco: Never Used  Substance Use Topics  . Alcohol use: Yes    Comment: 4 times a year  . Drug use: No    FAMILY HISTORY: Family History  Problem Relation Age of Onset  . Cancer Maternal Uncle   . Heart disease Father   . High Cholesterol Father   . Heart disease Paternal Grandfather   . Breast cancer Maternal Grandmother   . Rheum arthritis Mother   . Obesity Mother   . Rheum arthritis Maternal Grandfather   . Rheum arthritis Maternal Aunt     ROS: Review of Systems  Constitutional: Positive for weight loss.  Gastrointestinal: Negative for nausea.  Endo/Heme/Allergies:       Negative hypoglycemia  Psychiatric/Behavioral: Positive for depression. Negative for suicidal ideas.       + Anxiety    PHYSICAL EXAM: Blood pressure 115/78, pulse 88, temperature 97.8 F (36.6 C), temperature source Oral, height 5\' 1"  (1.549 m), weight 269 lb (122 kg), SpO2 93 %. Body mass index is 50.83 kg/m. Physical Exam  Constitutional: She is oriented to person, place, and time. She appears well-developed and well-nourished.  Cardiovascular: Normal rate.  Pulmonary/Chest: Effort normal.  Musculoskeletal: Normal range of motion.  Neurological: She is oriented to  person, place, and time.  Skin: Skin is warm and dry.  Psychiatric: She has a normal mood and affect. Her behavior is normal.  Vitals reviewed.   RECENT LABS AND TESTS: BMET    Component Value Date/Time   NA 142 12/07/2017 1204   K 4.1 12/07/2017 1204   CL 101 12/07/2017 1204   CO2 23 12/07/2017 1204   GLUCOSE 87 12/07/2017 1204  GLUCOSE 90 10/01/2017 1041   BUN 10 12/07/2017 1204   CREATININE 0.61 12/07/2017 1204   CREATININE 0.53 10/01/2017 1041   CALCIUM 9.7 12/07/2017 1204   GFRNONAA 111 12/07/2017 1204   GFRNONAA 116 10/01/2017 1041   GFRAA 128 12/07/2017 1204   GFRAA 135 10/01/2017 1041   Lab Results  Component Value Date   HGBA1C 5.8 (H) 12/07/2017   HGBA1C 6.0 (H) 10/01/2017   Lab Results  Component Value Date   INSULIN 20.1 12/07/2017   CBC    Component Value Date/Time   WBC 7.1 12/07/2017 1204   WBC 7.0 10/01/2017 1041   RBC 5.06 12/07/2017 1204   RBC 5.02 10/01/2017 1041   HGB 14.3 12/07/2017 1204   HCT 42.6 12/07/2017 1204   PLT 244 10/01/2017 1041   MCV 84 12/07/2017 1204   MCH 28.3 12/07/2017 1204   MCH 28.3 10/01/2017 1041   MCHC 33.6 12/07/2017 1204   MCHC 34.5 10/01/2017 1041   RDW 13.3 12/07/2017 1204   LYMPHSABS 2.3 12/07/2017 1204   EOSABS 0.2 12/07/2017 1204   BASOSABS 0.0 12/07/2017 1204   Iron/TIBC/Ferritin/ %Sat No results found for: IRON, TIBC, FERRITIN, IRONPCTSAT Lipid Panel     Component Value Date/Time   CHOL 148 12/07/2017 1204   TRIG 138 12/07/2017 1204   HDL 53 12/07/2017 1204   CHOLHDL 2.9 10/08/2017 1103   LDLCALC 67 12/07/2017 1204   LDLCALC 69 10/08/2017 1103   Hepatic Function Panel     Component Value Date/Time   PROT 7.4 12/07/2017 1204   ALBUMIN 4.3 12/07/2017 1204   AST 26 12/07/2017 1204   ALT 43 (H) 12/07/2017 1204   ALKPHOS 82 12/07/2017 1204   BILITOT 0.3 12/07/2017 1204      Component Value Date/Time   TSH 1.890 02/15/2018 1137   TSH 0.009 (L) 12/07/2017 1204   TSH 0.01 (L) 10/01/2017 1041      ASSESSMENT AND PLAN: Prediabetes  Depression with anxiety  Class 3 severe obesity with serious comorbidity and body mass index (BMI) of 50.0 to 59.9 in adult, unspecified obesity type ([place])  PLAN:  Pre-Diabetes Sahasra will continue to work on weight loss, diet, exercise, and decreasing simple carbohydrates in her diet to help decrease the risk of diabetes. We dicussed metformin including benefits and risks. She was informed that eating too many simple carbohydrates or too many calories at one sitting increases the likelihood of GI side effects. Graelyn agrees to continue taking metformin, and she agrees to follow up with our clinic in 2 to 3 weeks as directed to monitor her progress and we will recheck labs at that time.  Depression with Emotional Eating Behaviors We discussed behavior modification techniques today to help Machele deal with her emotional eating and depression. Esterlene may have a degree of body dysmorphic disorder, we will refer to Dr. Mallie Mussel, our bariatric psychologist for evaluation to see if this may be anxiety related and possible therapy. Denessa agrees to follow up with our clinic in 2 to 3 weeks.  We spent > than 50% of the 30 minute visit on the counseling as documented in the note.  Obesity Sudiksha is currently in the action stage of change. As such, her goal is to continue with weight loss efforts She has agreed to follow the Category 3 plan Latonja has been instructed to work up to a goal of 150 minutes of combined cardio and strengthening exercise per week for weight loss and overall health benefits. We discussed the following  Behavioral Modification Strategies today: increasing lean protein intake, decreasing simple carbohydrates, and travel eating strategies    Sherri has agreed to follow up with our clinic in 2 to 3 weeks. She was informed of the importance of frequent follow up visits to maximize her success with intensive lifestyle modifications for her multiple  health conditions.   OBESITY BEHAVIORAL INTERVENTION VISIT  Today's visit was # 8 out of 22.  Starting weight: 292 lbs Starting date: 12/07/17 Today's weight : 269 lbs  Today's date: 04/11/2018 Total lbs lost to date: 23    ASK: We discussed the diagnosis of obesity with [name] today and Shineka agreed to give [place] permission to discuss obesity behavioral modification therapy today.  ASSESS: Tylyn has the diagnosis of obesity and her BMI today is 50.85 Leveta is in the action stage of change   ADVISE: Kanaya was educated on the multiple health risks of obesity as well as the benefit of weight loss to improve her health. She was advised of the need for long term treatment and the importance of lifestyle modifications.  AGREE: Multiple dietary modification options and treatment options were discussed and  Aurorah agreed to the above obesity treatment plan.  I, Trixie Dredge, am acting as transcriptionist for Dennard Nip, MD  I have reviewed the above documentation for accuracy and completeness, and I agree with the above. -Dennard Nip, MD

## 2018-04-14 ENCOUNTER — Ambulatory Visit: Payer: Medicaid Other | Admitting: Physical Therapy

## 2018-04-14 ENCOUNTER — Other Ambulatory Visit: Payer: Self-pay | Admitting: Nurse Practitioner

## 2018-04-14 DIAGNOSIS — M4123 Other idiopathic scoliosis, cervicothoracic region: Secondary | ICD-10-CM

## 2018-04-14 DIAGNOSIS — M4125 Other idiopathic scoliosis, thoracolumbar region: Secondary | ICD-10-CM

## 2018-04-14 DIAGNOSIS — E038 Other specified hypothyroidism: Secondary | ICD-10-CM

## 2018-04-14 DIAGNOSIS — R2689 Other abnormalities of gait and mobility: Secondary | ICD-10-CM

## 2018-04-14 DIAGNOSIS — M791 Myalgia, unspecified site: Secondary | ICD-10-CM

## 2018-04-14 NOTE — Therapy (Signed)
[place] MAIN [facility] SERVICES [address] [place], [place], [address] Phone: [phone]   Fax:  [phone]  Physical Therapy Treatment  Patient Details  Name: [name] MRN: [id number] Date of Birth: [birth year] Referring Provider: Raelyn Ensign, MD    Encounter Date: 04/14/2018  PT End of Session - 04/14/18 1315    Visit Number  2    Number of Visits  12    Date for PT Re-Evaluation  06/16/18    PT Start Time  1310    PT Stop Time  3220    PT Time Calculation (min)  43 min    Activity Tolerance  Patient tolerated treatment well;No increased pain    Behavior During Therapy  WFL for tasks assessed/performed       Past Medical History:  Diagnosis Date  . Asthma   . Chronic fatigue   . Chronic pain    back and hips  . Fibromyalgia   . High cholesterol   . Insomnia   . Prediabetes   . Scoliosis   . Sleep apnea     Past Surgical History:  Procedure Laterality Date  . ABDOMINAL HYSTERECTOMY    . CESAREAN SECTION    . CHOLECYSTECTOMY    . SPINAL FUSION    . THYROIDECTOMY    . TONSILLECTOMY    . TUBAL LIGATION      There were no vitals filed for this visit.  Subjective Assessment - 04/14/18 1313    Subjective  Pt reported feeling great after last session with no neck pain norheadache for 24 hours. Pt only had 3 headaches across sthe past 2 weeks with one headache lasting 36 hours, while the other 2 lasted 6 -8  hours compared to previously when all headaches lasted 2 days    Pertinent History  2 vaginal deliveries, 2 twin via C-section          [facility] PT Assessment - 04/14/18 1316      Observation/Other Assessments   Observations  bra strap donned too tight, limited diaphragmatic excursion.      AROM   Overall AROM Comments  L sidebend limited       Palpation   SI assessment   ASIS equal     Palpation comment  R shoulder higher,  shoulder dyskinesis R delayed ,  increased tightness medial to R scapula  and upper  trap tightness                    [facility] Adult PT Treatment/Exercise - 04/14/18 1351      Neuro Re-ed    Neuro Re-ed Details   see pt instructions       Modalities   Modalities  Moist Heat      Moist Heat Therapy   Moist Heat Location  Cervical   during body scan      Manual Therapy   Manual therapy comments  STM along R medial scapula area  with MWM             PT Education - 04/14/18 1341    Education Details  HEP    Person(s) Educated  Patient    Methods  Explanation;Demonstration;Tactile cues;Verbal cues;Handout    Comprehension  Returned demonstration;Verbalized understanding          PT Long Term Goals - 03/24/18 1325      PT LONG TERM GOAL #1   Title  Pt will report being able to sleep on  her R side without waking up with a HA across 2-3 nights / week for 1 week in order improve sleep quality     Baseline  HA upon waking    Time  8    Period  Weeks    Status  New    Target Date  05/19/18      PT LONG TERM GOAL #2   Title  Pt will be able to lie on her L side with L LBP < 2/10 in order to improve sleep     Baseline  LBP at 6/10 when lying on L side     Time  8    Period  Weeks    Status  New    Target Date  05/19/18      PT LONG TERM GOAL #3   Title  Pt will decrease ODI score <22 % in order to go on field trips with children      Baseline  32%     Time  12    Period  Weeks    Status  New    Target Date  06/16/18      PT LONG TERM GOAL #4   Title  R sidebend of spine will increase (from nailbed of digit III to floor  56 cm) and R rotation 45%    Baseline  R sidebend from nailbed of digit III to floor 49 cm, L 56 cm.  R rotation ~25 %     Time  2    Period  Weeks    Status  New    Target Date  04/07/18      PT LONG TERM GOAL #5   Title  Pt will demo no cervical mm tightness of L , thoracic tightness of R interspinals in order to perform household chores and walk     Time  6    Period  Weeks    Status  New    Target Date   05/05/18      Additional Long Term Goals   Additional Long Term Goals  Yes      PT LONG TERM GOAL #6   Title  Pt will decrease her NDI score to < 27% in order to drive and perform ADLs    Baseline  37%     Time  12    Period  Weeks    Status  New    Target Date  06/16/18            Plan - 04/14/18 1316    Clinical Impression Statement Pt responded well to previous session as pt reported she has only decreased HA  in frequency and duration. Pt is compliant with her shoe lift. Pt tolerated manual Tx today without complaints. Initiated bilateral stretches to minimize mm tensions on R and added thoracolumbar, cervical retraction strengthening.  Added mindfulness/ relaxation technique as well. Educated pt on wearing bra strap less tight to optimize diaphragmatic breathing.  Plan to address scoliotic curve at next session to promote longer lasting changes for less mm tensions at R scapula/ shoulder/ neck mm and increased R spinal side flexion. Continues to benefit from skilled PT.    Rehab Potential  Good    PT Frequency  1x / week    PT Treatment/Interventions  Manual lymph drainage;Energy conservation;Scar mobilization;Taping;Neuromuscular re-education;Balance training;Therapeutic exercise;Therapeutic activities;Patient/family education;Aquatic Therapy;ADLs/Self Care Home Management;Gait training;Stair training;Electrical Stimulation    Consulted and Agree with Plan of Care  Patient  Patient will benefit from skilled therapeutic intervention in order to improve the following deficits and impairments:  Decreased scar mobility, Decreased range of motion, Decreased strength, Decreased safety awareness, Decreased activity tolerance, Increased muscle spasms, Improper body mechanics, Increased fascial restricitons, Hypermobility, Postural dysfunction, Pain, Difficulty walking, Decreased endurance, Decreased balance  Visit Diagnosis: Other idiopathic scoliosis, thoracolumbar region  Other  idiopathic scoliosis, cervicothoracic region  Myalgia  Other abnormalities of gait and mobility     Problem List Patient Active Problem List   Diagnosis Date Noted  . Allergic rhinitis 03/09/2018  . Tension headache 03/09/2018  . Other fatigue 12/07/2017  . Shortness of breath on exertion 12/07/2017  . Other specified hypothyroidism 12/07/2017  . Prediabetes 10/15/2017  . Gastroesophageal reflux disease without esophagitis 10/15/2017  . Nasal valve collapse 10/15/2017  . 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/01/2017  . Chronic midline back pain 10/01/2017  . Vitamin D deficiency 10/01/2017  . Anxiety 10/01/2017  . PCOS (polycystic ovarian syndrome) 10/01/2017  . Severe episode of recurrent major depressive disorder, without psychotic features ([place]) 10/01/2017  . BMI 50.0-59.9, adult ([place]) 10/01/2017  . Fibromyalgia 10/01/2017  . Postoperative hypothyroidism 10/01/2017  . OSA (obstructive sleep apnea) 02/26/2014    Jerl Mina ,PT, DPT, E-RYT  04/14/2018, 1:54 PM  [place] MAIN [facility] SERVICES [address] [place], [place], [address] Phone: [phone]   Fax:  [phone]  Name: [name] MRN: [id number] Date of Birth: 04-Aug-1976

## 2018-04-14 NOTE — Patient Instructions (Addendum)
Stretches x 2 day  Do the same stretches fro first session on both sides this week   Add angle wings slides on your back, and then turn the neck. Lower shoulder down  Add  Self-hug -->  Elbows straight,thumbs down, cross wrists over Bring knuckles by your chest and end with them under chin Switch other hand on top  5 reps   Add seated: R trunk lean, R shoulder relaxed    Strengthening  Seated: pelvic neutral , feet on the ground Hands back by hips, shoulders gentle squeeze back and down, chin gentle scoots back  10 sec holds x 5  5 x day   Relaxation Body scan ( emailed audio + research)  2 x day morning and night    Wear bra lower 1 in from elbow, farest hook or extensor  To allow for optimal diaphragmatic breathing - at the ribs  Instead of chest breathing and upper shoulder muscle tensions

## 2018-04-21 ENCOUNTER — Ambulatory Visit: Payer: Medicaid Other | Admitting: Physical Therapy

## 2018-04-25 ENCOUNTER — Ambulatory Visit: Payer: Medicaid Other | Admitting: Family Medicine

## 2018-04-25 ENCOUNTER — Encounter: Payer: Self-pay | Admitting: Family Medicine

## 2018-04-25 ENCOUNTER — Telehealth: Payer: Self-pay | Admitting: Family Medicine

## 2018-04-25 VITALS — BP 132/64 | HR 95 | Ht 61.0 in | Wt 272.9 lb

## 2018-04-25 DIAGNOSIS — H9201 Otalgia, right ear: Secondary | ICD-10-CM | POA: Diagnosis not present

## 2018-04-25 DIAGNOSIS — J309 Allergic rhinitis, unspecified: Secondary | ICD-10-CM | POA: Diagnosis not present

## 2018-04-25 DIAGNOSIS — Z8669 Personal history of other diseases of the nervous system and sense organs: Secondary | ICD-10-CM

## 2018-04-25 MED ORDER — FEXOFENADINE HCL 180 MG PO TABS: 180.0000 mg | ORAL_TABLET | Freq: Every day | ORAL | 1 refills | Status: DC

## 2018-04-25 MED ORDER — MONTELUKAST SODIUM 10 MG PO TABS: 10.0000 mg | ORAL_TABLET | Freq: Every day | ORAL | 3 refills | Status: DC

## 2018-04-25 NOTE — Telephone Encounter (Signed)
Please check on Pap history/results from [facility] Dr. Tressia Danas.

## 2018-04-25 NOTE — Progress Notes (Signed)
Name: [name]   MRN: [id number]    DOB: Apr 03, 1976   Date:04/25/2018       Progress Note  Subjective  Chief Complaint  Chief Complaint  Patient presents with  . Ear Pain    Right ear    HPI  PT presents with concern for RIGHT ear pain.  She has had four ear infections since the beginning of the year all in the right side.  She notes fullness and itching to the right ear initially, then pain starts.  She usually waits for a while to get treatment and then goes to a walk-in clinic for treatment, but today she decided to come in before it is really bad.  She has been on claritin for years, taking dymista without issue. Has never taken singulair.  She does endorse intermittent cough with some yellow sputum production and ongoing nasal congestion (does have nasal valve collapse on the right.  Denies shortness of breath, chest pain, fevers, chills, itchy/watery eyes.  Patient Active Problem List   Diagnosis Date Noted  . Allergic rhinitis 03/09/2018  . Tension headache 03/09/2018  . Other fatigue 12/07/2017  . Shortness of breath on exertion 12/07/2017  . Other specified hypothyroidism 12/07/2017  . Prediabetes 10/15/2017  . Gastroesophageal reflux disease without esophagitis 10/15/2017  . Nasal valve collapse 10/15/2017  . 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/01/2017  . Chronic midline back pain 10/01/2017  . Vitamin D deficiency 10/01/2017  . Anxiety 10/01/2017  . PCOS (polycystic ovarian syndrome) 10/01/2017  . Severe episode of recurrent major depressive disorder, without psychotic features ([place]) 10/01/2017  . BMI 50.0-59.9, adult ([place]) 10/01/2017  . Fibromyalgia 10/01/2017  . Postoperative hypothyroidism 10/01/2017  . OSA (obstructive sleep apnea) 02/26/2014    Social History   Tobacco Use  . Smoking status: Former Smoker    Packs/day: 0.25    Years: 12.00    Pack years: 3.00    Types: Cigarettes    Last attempt to quit: 09/07/2004    Years since quitting: 13.6    . Smokeless tobacco: Never Used  Substance Use Topics  . Alcohol use: Yes    Comment: 4 times a year     Current Outpatient Medications:  .  acetaminophen (TYLENOL) 325 MG tablet, Take 650 mg by mouth every 6 (six) hours as needed., Disp: , Rfl:  .  ARMOUR THYROID 120 MG tablet, TAKE 1 TABLET BY MOUTH ONCE DAILY BEFOREBREAKFAST, Disp: 90 tablet, Rfl: 0 .  citalopram (CELEXA) 20 MG tablet, Take 20 mg by mouth daily.  , Disp: , Rfl:  .  clonazePAM (KLONOPIN) 0.5 MG tablet, Take 0.5 mg by mouth 3 (three) times daily as needed for anxiety., Disp: , Rfl:  .  diclofenac (VOLTAREN) 75 MG EC tablet, Take 1 tablet (75 mg total) by mouth 2 (two) times daily as needed., Disp: 90 tablet, Rfl: 0 .  fluticasone (FLONASE) 50 MCG/ACT nasal spray, Place 2 sprays into both nostrils daily., Disp: 16 g, Rfl: 6 .  gabapentin (NEURONTIN) 100 MG capsule, Take 100 mg by mouth 3 (three) times daily., Disp: , Rfl:  .  ibuprofen (ADVIL,MOTRIN) 200 MG tablet, Take 200 mg by mouth every 6 (six) hours as needed., Disp: , Rfl:  .  loratadine (CLARITIN) 10 MG tablet, Take 1 tablet (10 mg total) by mouth daily., Disp: 30 tablet, Rfl: 11 .  metFORMIN (GLUCOPHAGE) 500 MG tablet, Take 1 tablet (500 mg total) by mouth 2 (two) times daily with a meal., Disp: 180  tablet, Rfl: 0 .  omeprazole (PRILOSEC) 20 MG capsule, Take 20 mg by mouth daily., Disp: , Rfl:  .  VITAMIN D, CHOLECALCIFEROL, PO, Take 50 mcg by mouth 2 (two) times daily., Disp: , Rfl:  .  zolpidem (AMBIEN) 5 MG tablet, Take 10 mg by mouth at bedtime as needed. , Disp: , Rfl:  .  predniSONE (DELTASONE) 5 MG tablet, Take 1 tablet (5 mg total) by mouth daily with breakfast., Disp: 90 tablet, Rfl: 0  Allergies  Allergen Reactions  . Erythromycin   . Zyrtec [Cetirizine Hcl]     ROS  Ten systems reviewed and is negative except as mentioned in HPI  Objective  Vitals:   04/25/18 1503  BP: 132/64  Pulse: 95  SpO2: 95%  Weight: 272 lb 14.4 oz (123.8 kg)   Height: 5\' 1"  (1.549 m)   Body mass index is 51.56 kg/m.  Nursing Note and Vital Signs reviewed.  Physical Exam  Constitutional: Patient appears well-developed and well-nourished. No distress.  HENT: Head: Normocephalic and atraumatic. Ears: B TMs with small amount of scarring bilaterally, no erythema or effusion; Nose: Nose normal. Mouth/Throat: Oropharynx is clear and moist. No oropharyngeal exudate.  Eyes: Conjunctivae and EOM are normal. Pupils are equal, round, and reactive to light. No scleral icterus.  Neck: Normal range of motion. Neck supple. No JVD present. No thyromegaly present.  Cardiovascular: Normal rate, regular rhythm and normal heart sounds.  No murmur heard. No BLE edema. Pulmonary/Chest: Effort normal and breath sounds normal. No respiratory distress. Abdominal: Soft. Bowel sounds are normal, no distension. There is no tenderness. no masses Musculoskeletal: Normal range of motion, no joint effusions. No gross deformities Neurological: he is alert and oriented to person, place, and time. No cranial nerve deficit. Coordination, balance, strength, speech and gait are normal.  Skin: Skin is warm and dry. No rash noted. No erythema.  Psychiatric: Patient has a normal mood and affect. behavior is normal. Judgment and thought content normal.  No results found for this or any previous visit (from the past 72 hour(s)).  Assessment & Plan  1. Allergic rhinitis, unspecified seasonality, unspecified trigger - fexofenadine (ALLEGRA) 180 MG tablet; Take 1 tablet (180 mg total) by mouth daily.  Dispense: 90 tablet; Refill: 1 - montelukast (SINGULAIR) 10 MG tablet; Take 1 tablet (10 mg total) by mouth at bedtime.  Dispense: 30 tablet; Refill: 3  2. Otalgia of right ear - fexofenadine (ALLEGRA) 180 MG tablet; Take 1 tablet (180 mg total) by mouth daily.  Dispense: 90 tablet; Refill: 1 - Ambulatory referral to ENT  3. History of recurrent ear infection - fexofenadine (ALLEGRA)  180 MG tablet; Take 1 tablet (180 mg total) by mouth daily.  Dispense: 90 tablet; Refill: 1 - Ambulatory referral to ENT

## 2018-04-25 NOTE — Telephone Encounter (Signed)
patient must sign medical records release at next appointment

## 2018-04-26 ENCOUNTER — Ambulatory Visit: Payer: Medicaid Other | Admitting: Physical Therapy

## 2018-04-26 DIAGNOSIS — M791 Myalgia, unspecified site: Secondary | ICD-10-CM

## 2018-04-26 DIAGNOSIS — M4125 Other idiopathic scoliosis, thoracolumbar region: Secondary | ICD-10-CM | POA: Diagnosis not present

## 2018-04-26 DIAGNOSIS — M4123 Other idiopathic scoliosis, cervicothoracic region: Secondary | ICD-10-CM

## 2018-04-26 DIAGNOSIS — R2689 Other abnormalities of gait and mobility: Secondary | ICD-10-CM

## 2018-04-26 NOTE — Patient Instructions (Addendum)
L hip/ low back stretches:   L hip/ buttock stretch Cross L thigh over R, R hand pulls L thigh closer  5 breaths      Reclined twist for hips and side of the hips/ legs and back Lay on your back, knees bend Scoot hips to the R , leave shoulders in place Drop knees to the L side resting onto pillows to keep leg at the same width of hips Pillow under L thigh to minimize too much strain    Sports administrator:  Lie R sidelying: Pull L leg to stretch the quad with sheet at shin , elbow by side   5 reps -10 reps     _____  Deep core strengthening    Deep core 6 min in the morning and evening Slide foot forward 5" ahead with exhale     _______   While cooking or after :  Stretching at the counter: Arms outstretched over the counter( mini squat stance)  Lengthen spine Scoot the hips to the L only to  Lengthen the R side of the body

## 2018-04-26 NOTE — Therapy (Signed)
[place] MAIN [facility] SERVICES [address] [place], [place], [address] Phone: [phone]   Fax:  [phone]  Physical Therapy Treatment  Patient Details  Name: [name] MRN: [id number] Date of Birth: 1976/07/17 Referring Provider: Raelyn Ensign, MD    Encounter Date: 04/26/2018  PT End of Session - 04/26/18 1043    Visit Number  3    Number of Visits  12    Date for PT Re-Evaluation  07/19/2018     PT Start Time  1007    PT Stop Time  1100    PT Time Calculation (min)  53 min    Activity Tolerance  Patient tolerated treatment well;No increased pain    Behavior During Therapy  WFL for tasks assessed/performed       Past Medical History:  Diagnosis Date  . Asthma   . Chronic fatigue   . Chronic pain    back and hips  . Fibromyalgia   . High cholesterol   . Insomnia   . Prediabetes   . Scoliosis   . Sleep apnea     Past Surgical History:  Procedure Laterality Date  . ABDOMINAL HYSTERECTOMY    . CESAREAN SECTION    . CHOLECYSTECTOMY    . SPINAL FUSION    . THYROIDECTOMY    . TONSILLECTOMY    . TUBAL LIGATION      There were no vitals filed for this visit.  Subjective Assessment - 04/26/18 1009    Subjective  Pt reported she felt good after last session which lasted for a few days before she started coming down with a cold/ sinus issues. Pt has been to the doctors for it but they did not find any infections.  Pt has done some of the PT exercises but pt's energy has been low and she has felt she needed to lay around.    Pertinent History  2 vaginal deliveries, 2 twin via C-section          [facility] PT Assessment - 04/26/18 1039      Coordination   Gross Motor Movements are Fluid and Coordinated  --   L lateral hip pain after 6 min of deep core level 2,     Palpation   Palpation comment  decreased tensions at upper trap and medial scapular mm                    [facility] Adult PT Treatment/Exercise -  04/26/18 1056      Exercises   Exercises  --   see pt instructions      Modalities   Modalities  Moist Heat      Moist Heat Therapy   Number Minutes Moist Heat  5 Minutes    Moist Heat Location  --   L hip , during quad stretches     Manual Therapy   Manual therapy comments  L long axis distraction, PA mob for L hip into extension Grade III              PT Education - 04/26/18 1042    Education Details  HEP    Person(s) Educated  Patient    Methods  Explanation;Demonstration;Tactile cues;Verbal cues;Handout    Comprehension  Returned demonstration;Verbalized understanding;Tactile cues required;Verbal cues required          PT Long Term Goals - 04/26/18 1113      PT LONG TERM GOAL #1   Title  Pt will  report being able to sleep on her R side without waking up with a HA across 2-3 nights / week for 1 week in order improve sleep quality     Baseline  HA upon waking    Time  8    Period  Weeks    Status  On-going      PT LONG TERM GOAL #2   Title  Pt will be able to lie on her L side with L LBP < 2/10 in order to improve sleep     Baseline  LBP at 6/10 when lying on L side     Time  8    Period  Weeks    Status  On-going      PT LONG TERM GOAL #3   Title  Pt will decrease ODI score <22 % in order to go on field trips with children      Baseline  32%     Time  12    Period  Weeks    Status  On-going      PT LONG TERM GOAL #4   Title  R sidebend of spine will increase (from nailbed of digit III to floor  56 cm) and R rotation 45%    Baseline  R sidebend from nailbed of digit III to floor 49 cm, L 56 cm.  R rotation ~25 %     Time  2    Period  Weeks    Status  On-going      PT LONG TERM GOAL #5   Title  Pt will demo no cervical mm tightness of L , thoracic tightness of R interspinals in order to perform household chores and walk     Time  6    Period  Weeks    Status  Achieved      Additional Long Term Goals   Additional Long Term Goals  Yes      PT  LONG TERM GOAL #6   Title  Pt will decrease her NDI score to < 27% in order to drive and perform ADLs    Baseline  37%     Time  12    Period  Weeks    Status  On-going      PT LONG TERM GOAL #7   Title  Pt will be IND with scoliosis HEP to manage pain    Time  8    Period  Weeks    Status  New    Target Date  06/21/18            Plan - 04/26/18 1058    Clinical Impression Statement Pt has had headaches that are less intense and shorter in duration since last session. Pt showed decreased upper trap and R medial scapular mm tensions compared to last Tx; indicating good carry-over. Addressed L hip pain today which is likely related to scoliosis. Following Tx, pt reported relief but it became painful again at the end of Tx. Once pt was provided a SIJ belt, pt reported relief in her L hip by 25%. Modified deep core in HEP to minimize L hip pain. Pt continues to require skilled PT to achieve goals.     Rehab Potential  Good    PT Frequency  1x / week    PT Treatment/Interventions  Manual lymph drainage;Energy conservation;Scar mobilization;Taping;Neuromuscular re-education;Balance training;Therapeutic exercise;Therapeutic activities;Patient/family education;Aquatic Therapy;ADLs/Self Care Home Management;Gait training;Stair training;Electrical Stimulation    Consulted and Agree with Plan of Care  Patient       Patient will benefit from skilled therapeutic intervention in order to improve the following deficits and impairments:  Decreased scar mobility, Decreased range of motion, Decreased strength, Decreased safety awareness, Decreased activity tolerance, Increased muscle spasms, Improper body mechanics, Increased fascial restricitons, Hypermobility, Postural dysfunction, Pain, Difficulty walking, Decreased endurance, Decreased balance  Visit Diagnosis: Other idiopathic scoliosis, thoracolumbar region  Other idiopathic scoliosis, cervicothoracic region  Myalgia  Other abnormalities  of gait and mobility     Problem List Patient Active Problem List   Diagnosis Date Noted  . Allergic rhinitis 03/09/2018  . Tension headache 03/09/2018  . Other fatigue 12/07/2017  . Shortness of breath on exertion 12/07/2017  . Other specified hypothyroidism 12/07/2017  . Prediabetes 10/15/2017  . Gastroesophageal reflux disease without esophagitis 10/15/2017  . Nasal valve collapse 10/15/2017  . 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/01/2017  . Chronic midline back pain 10/01/2017  . Vitamin D deficiency 10/01/2017  . Anxiety 10/01/2017  . PCOS (polycystic ovarian syndrome) 10/01/2017  . Severe episode of recurrent major depressive disorder, without psychotic features ([place]) 10/01/2017  . BMI 50.0-59.9, adult ([place]) 10/01/2017  . Fibromyalgia 10/01/2017  . Postoperative hypothyroidism 10/01/2017  . OSA (obstructive sleep apnea) 02/26/2014    Jerl Mina 04/26/2018, 11:15 AM  [place] MAIN [facility] SERVICES [address] [place], [place], [address] Phone: [phone]   Fax:  [phone]  Name: LINDALOU SOLTIS MRN: [id number] Date of Birth: 08/21/1976

## 2018-04-27 ENCOUNTER — Ambulatory Visit (INDEPENDENT_AMBULATORY_CARE_PROVIDER_SITE_OTHER): Payer: Medicaid Other | Admitting: Family Medicine

## 2018-04-27 ENCOUNTER — Ambulatory Visit (INDEPENDENT_AMBULATORY_CARE_PROVIDER_SITE_OTHER): Payer: Self-pay | Admitting: Psychology

## 2018-04-27 VITALS — BP 107/74 | HR 81 | Temp 97.6°F | Ht 61.0 in | Wt 271.0 lb

## 2018-04-27 DIAGNOSIS — E038 Other specified hypothyroidism: Secondary | ICD-10-CM

## 2018-04-27 DIAGNOSIS — R7303 Prediabetes: Secondary | ICD-10-CM | POA: Diagnosis not present

## 2018-04-27 DIAGNOSIS — E559 Vitamin D deficiency, unspecified: Secondary | ICD-10-CM | POA: Diagnosis not present

## 2018-04-27 DIAGNOSIS — E538 Deficiency of other specified B group vitamins: Secondary | ICD-10-CM | POA: Diagnosis not present

## 2018-04-27 DIAGNOSIS — Z6841 Body Mass Index (BMI) 40.0 and over, adult: Secondary | ICD-10-CM

## 2018-04-27 MED ORDER — METFORMIN HCL 500 MG PO TABS: 500.0000 mg | ORAL_TABLET | Freq: Two times a day (BID) | ORAL | 0 refills | Status: DC

## 2018-04-27 NOTE — Progress Notes (Signed)
Office: [phone]  /  Fax: [phone]   HPI:   Chief Complaint: OBESITY [name] is here to discuss her progress with her obesity treatment plan. She is on the Category 3 plan and is following her eating plan approximately 80 % of the time. She states she is exercising 0 minutes 0 times per week. [name] is retaining fluid today. She has been sick with an upper respiratory infection and she is feeling more queezy, so she has increased simple carbohydrates to help settle her stomach.  Her weight is 271 lb (122.9 kg) today and has gained 2 pounds since her last visit. She has lost 21 lbs since starting treatment with [place].  Pre-Diabetes [name] has a diagnosis of pre-diabetes based on her elevated Hgb A1c and was informed this puts her at greater risk of developing diabetes. [name] is stable on metformin and she is due for labs. She denies nausea, vomiting, or hypoglycemia. She continues to work on diet and exercise to decrease risk of diabetes.   Vitamin D Deficiency [name] has a diagnosis of vitamin D deficiency. She is stable on prescription Vit D and she is due for labs. She denies nausea, vomiting or muscle weakness.  Vitamin B12 Deficiency [name] has a diagnosis of B12 insufficiency and notes fatigue. She is on metformin so she is at high risk of B12 deficiency. She is not a vegetarian and does not have a previous diagnosis of pernicious anemia. She does not have a history of weight loss surgery.   Hypothyroidism [name] has a diagnosis of hypothyroidism. She is on Armour Thyroid through another physician. She still notes fatigue and denies hot or cold intolerance or palpitations.  ALLERGIES: Allergies  Allergen Reactions  . Erythromycin   . Zyrtec [Cetirizine Hcl]     Only allergic to Zyrtec D -     MEDICATIONS: Current Outpatient Medications on File Prior to Visit  Medication Sig Dispense Refill  . acetaminophen (TYLENOL) 325 MG tablet Take 650 mg by mouth every 6 (six) hours as  needed.    [name] Greaves THYROID 120 MG tablet TAKE 1 TABLET BY MOUTH ONCE DAILY BEFOREBREAKFAST 90 tablet 0  . Azelastine-Fluticasone 137-50 MCG/ACT SUSP Place 1 spray into the nose daily.    . citalopram (CELEXA) 20 MG tablet Take 20 mg by mouth daily.      . clonazePAM (KLONOPIN) 0.5 MG tablet Take 0.5 mg by mouth 3 (three) times daily as needed for anxiety.    . diclofenac (VOLTAREN) 75 MG EC tablet Take 1 tablet (75 mg total) by mouth 2 (two) times daily as needed. 90 tablet 0  . fexofenadine (ALLEGRA) 180 MG tablet Take 1 tablet (180 mg total) by mouth daily. 90 tablet 1  . gabapentin (NEURONTIN) 100 MG capsule Take 100 mg by mouth 3 (three) times daily.    [name] Kitchen ibuprofen (ADVIL,MOTRIN) 200 MG tablet Take 200 mg by mouth every 6 (six) hours as needed.    . montelukast (SINGULAIR) 10 MG tablet Take 1 tablet (10 mg total) by mouth at bedtime. 30 tablet 3  . omeprazole (PRILOSEC) 20 MG capsule Take 20 mg by mouth daily.    [name] Kitchen VITAMIN D, CHOLECALCIFEROL, PO Take 50 mcg by mouth 2 (two) times daily.    [name] Kitchen zolpidem (AMBIEN) 5 MG tablet Take 10 mg by mouth at bedtime as needed.      No current facility-administered medications on file prior to visit.     PAST MEDICAL HISTORY: Past Medical History:  Diagnosis Date  .  Asthma   . Chronic fatigue   . Chronic pain    back and hips  . Fibromyalgia   . High cholesterol   . Insomnia   . Prediabetes   . Scoliosis   . Sleep apnea     PAST SURGICAL HISTORY: Past Surgical History:  Procedure Laterality Date  . ABDOMINAL HYSTERECTOMY    . CESAREAN SECTION    . CHOLECYSTECTOMY    . SPINAL FUSION    . THYROIDECTOMY    . TONSILLECTOMY    . TUBAL LIGATION      SOCIAL HISTORY: Social History   Tobacco Use  . Smoking status: Former Smoker    Packs/day: 0.25    Years: 12.00    Pack years: 3.00    Types: Cigarettes    Last attempt to quit: 09/07/2004    Years since quitting: 13.6  . Smokeless tobacco: Never Used  Substance Use Topics  .  Alcohol use: Yes    Comment: 4 times a year  . Drug use: No    FAMILY HISTORY: Family History  Problem Relation Age of Onset  . Cancer Maternal Uncle   . Heart disease Father   . High Cholesterol Father   . Heart disease Paternal Grandfather   . Breast cancer Maternal Grandmother   . Rheum arthritis Mother   . Obesity Mother   . Rheum arthritis Maternal Grandfather   . Rheum arthritis Maternal Aunt     ROS: Review of Systems  Constitutional: Positive for malaise/fatigue. Negative for weight loss.  Gastrointestinal: Negative for nausea and vomiting.  Musculoskeletal:       Negative muscle weakness  Endo/Heme/Allergies:       Negative hypoglycemia Negative hot/cold intolerance    PHYSICAL EXAM: Blood pressure 107/74, pulse 81, temperature 97.6 F (36.4 C), temperature source Oral, height 5\' 1"  (1.549 m), weight 271 lb (122.9 kg), SpO2 96 %. Body mass index is 51.21 kg/m. Physical Exam  Constitutional: She is oriented to person, place, and time. She appears well-developed and well-nourished.  Cardiovascular: Normal rate.  Pulmonary/Chest: Effort normal.  Musculoskeletal: Normal range of motion.  Neurological: She is oriented to person, place, and time.  Skin: Skin is warm and dry.  Psychiatric: She has a normal mood and affect. Her behavior is normal.  Vitals reviewed.   RECENT LABS AND TESTS: BMET    Component Value Date/Time   NA 142 12/07/2017 1204   K 4.1 12/07/2017 1204   CL 101 12/07/2017 1204   CO2 23 12/07/2017 1204   GLUCOSE 87 12/07/2017 1204   GLUCOSE 90 10/01/2017 1041   BUN 10 12/07/2017 1204   CREATININE 0.61 12/07/2017 1204   CREATININE 0.53 10/01/2017 1041   CALCIUM 9.7 12/07/2017 1204   GFRNONAA 111 12/07/2017 1204   GFRNONAA 116 10/01/2017 1041   GFRAA 128 12/07/2017 1204   GFRAA 135 10/01/2017 1041   Lab Results  Component Value Date   HGBA1C 5.8 (H) 12/07/2017   HGBA1C 6.0 (H) 10/01/2017   Lab Results  Component Value Date    INSULIN 20.1 12/07/2017   CBC    Component Value Date/Time   WBC 7.1 12/07/2017 1204   WBC 7.0 10/01/2017 1041   RBC 5.06 12/07/2017 1204   RBC 5.02 10/01/2017 1041   HGB 14.3 12/07/2017 1204   HCT 42.6 12/07/2017 1204   PLT 244 10/01/2017 1041   MCV 84 12/07/2017 1204   MCH 28.3 12/07/2017 1204   MCH 28.3 10/01/2017 1041   MCHC 33.6 12/07/2017  1204   MCHC 34.5 10/01/2017 1041   RDW 13.3 12/07/2017 1204   LYMPHSABS 2.3 12/07/2017 1204   EOSABS 0.2 12/07/2017 1204   BASOSABS 0.0 12/07/2017 1204   Iron/TIBC/Ferritin/ %Sat No results found for: IRON, TIBC, FERRITIN, IRONPCTSAT Lipid Panel     Component Value Date/Time   CHOL 148 12/07/2017 1204   TRIG 138 12/07/2017 1204   HDL 53 12/07/2017 1204   CHOLHDL 2.9 10/08/2017 1103   LDLCALC 67 12/07/2017 1204   LDLCALC 69 10/08/2017 1103   Hepatic Function Panel     Component Value Date/Time   PROT 7.4 12/07/2017 1204   ALBUMIN 4.3 12/07/2017 1204   AST 26 12/07/2017 1204   ALT 43 (H) 12/07/2017 1204   ALKPHOS 82 12/07/2017 1204   BILITOT 0.3 12/07/2017 1204      Component Value Date/Time   TSH 1.890 02/15/2018 1137   TSH 0.009 (L) 12/07/2017 1204   TSH 0.01 (L) 10/01/2017 1041  Results for Gaal, Marissa S "Beatrix Twichell" (MRN [id number]) as of 04/27/2018 13:18  Ref. Range 12/07/2017 12:04  Vitamin D, 25-Hydroxy Latest Ref Range: 30.0 - 100.0 ng/mL 50.4   Results for Eustice, Marissa S "Benjamin Sar" (MRN [id number]) as of 04/27/2018 13:18  Ref. Range 12/07/2017 12:04  Vitamin B12 Latest Ref Range: 232 - 1,245 pg/mL 557   ASSESSMENT AND PLAN: Prediabetes - Plan: CBC With Differential, Folate, Comprehensive metabolic panel, Hemoglobin A1c, Insulin, random, Lipid Panel With LDL/HDL Ratio, metFORMIN (GLUCOPHAGE) 500 MG tablet  Vitamin D deficiency - Plan: VITAMIN D 25 Hydroxy (Vit-D Deficiency, Fractures)  B12 nutritional deficiency - Plan: Vitamin B12  Other specified hypothyroidism - Plan: T3, T4, free, TSH  Class 3 severe obesity  with serious comorbidity and body mass index (BMI) of 50.0 to 59.9 in adult, unspecified obesity type ([name])  PLAN:  Pre-Diabetes Nashea will continue to work on weight loss, exercise, and decreasing simple carbohydrates in her diet to help decrease the risk of diabetes. We dicussed metformin including benefits and risks. She was informed that eating too many simple carbohydrates or too many calories at one sitting increases the likelihood of GI side effects. Verlisa agrees to continue taking metformin 500 mg BID #60 and we will refill for 1 month. Layal agrees to follow up with our clinic in 2 to 3 weeks as directed to monitor her progress.  Vitamin D Deficiency [name] was informed that low vitamin D levels contributes to fatigue and are associated with obesity, breast, and colon cancer. [name] agrees to continue taking prescription Vit D 50 mcg BID and will follow up for routine testing of vitamin D, at least 2-3 times per year. She was informed of the risk of over-replacement of vitamin D and agrees to not increase her dose unless she discusses this with [place] first. We will check labs and Rosamond agrees to follow up with our clinic in 2 to 3 weeks.  Vitamin B12 Deficiency [name] will work on increasing B12 rich foods in her diet. B12 supplementation was not prescribed today. We will check labs and [name] agrees to follow up with our clinic in 2 to 3 weeks.  Hypothyroidism [name] was informed of the importance of good thyroid control to help with weight loss efforts. She was also informed that supertheraputic thyroid levels are dangerous and will not improve weight loss results. We will check labs and send results to her primary care physician. [name] agrees to follow up with our clinic in 2 to 3 weeks.  Obesity [name]  is currently in the action stage of change. As such, her goal is to continue with weight loss efforts She has agreed to follow the Category 3 plan Celisse has been instructed to work up to a goal  of 150 minutes of combined cardio and strengthening exercise per week for weight loss and overall health benefits. We discussed the following Behavioral Modification Strategies today: increase H20 intake Alyxandra is to get back to her plan as she feels better.  Ciella has agreed to follow up with our clinic in 2 to 3 weeks. She was informed of the importance of frequent follow up visits to maximize her success with intensive lifestyle modifications for her multiple health conditions.   OBESITY BEHAVIORAL INTERVENTION VISIT  Today's visit was # 9.   Starting weight: 292 lbs Starting date: 12/07/17 Today's weight : 271 lbs Today's date: 04/27/2018 Total lbs lost to date: 21 At least 15 minutes were spent on discussing the following behavioral intervention visit.   ASK: We discussed the diagnosis of obesity with [name] today and [name] agreed to give [place] permission to discuss obesity behavioral modification therapy today.  ASSESS: [name] has the diagnosis of obesity and her BMI today is 51.23 [name] is in the action stage of change   ADVISE: Tanylah was educated on the multiple health risks of obesity as well as the benefit of weight loss to improve her health. She was advised of the need for long term treatment and the importance of lifestyle modifications to improve her current health and to decrease her risk of future health problems.  AGREE: Multiple dietary modification options and treatment options were discussed and  Darcy agreed to follow the recommendations documented in the above note.  ARRANGE: Nekesha was educated on the importance of frequent visits to treat obesity as outlined per CMS and USPSTF guidelines and agreed to schedule her next follow up appointment today.  I, Trixie Dredge, am acting as transcriptionist for Dennard Nip, MD  I have reviewed the above documentation for accuracy and completeness, and I agree with the above. -Dennard Nip, MD

## 2018-04-28 LAB — CBC WITH DIFFERENTIAL
BASOS: 0 %
Basophils Absolute: 0 10*3/uL (ref 0.0–0.2)
EOS (ABSOLUTE): 0.1 10*3/uL (ref 0.0–0.4)
EOS: 2 %
HEMOGLOBIN: 13.4 g/dL (ref 11.1–15.9)
Hematocrit: 42.9 % (ref 34.0–46.6)
Immature Grans (Abs): 0 10*3/uL (ref 0.0–0.1)
Immature Granulocytes: 0 %
LYMPHS ABS: 1.3 10*3/uL (ref 0.7–3.1)
Lymphs: 23 %
MCH: 27.6 pg (ref 26.6–33.0)
MCHC: 31.2 g/dL — AB (ref 31.5–35.7)
MCV: 89 fL (ref 79–97)
MONOS ABS: 0.5 10*3/uL (ref 0.1–0.9)
Monocytes: 10 %
NEUTROS ABS: 3.6 10*3/uL (ref 1.4–7.0)
Neutrophils: 65 %
RBC: 4.85 x10E6/uL (ref 3.77–5.28)
RDW: 14.1 % (ref 12.3–15.4)
WBC: 5.6 10*3/uL (ref 3.4–10.8)

## 2018-04-28 LAB — COMPREHENSIVE METABOLIC PANEL
ALBUMIN: 4.1 g/dL (ref 3.5–5.5)
ALK PHOS: 75 IU/L (ref 39–117)
ALT: 35 IU/L — ABNORMAL HIGH (ref 0–32)
AST: 22 IU/L (ref 0–40)
Albumin/Globulin Ratio: 1.5 (ref 1.2–2.2)
BUN / CREAT RATIO: 30 — AB (ref 9–23)
BUN: 17 mg/dL (ref 6–24)
CHLORIDE: 107 mmol/L — AB (ref 96–106)
CO2: 20 mmol/L (ref 20–29)
Calcium: 8.8 mg/dL (ref 8.7–10.2)
Creatinine, Ser: 0.57 mg/dL (ref 0.57–1.00)
GFR calc Af Amer: 131 mL/min/{1.73_m2} (ref >59)
GFR calc non Af Amer: 114 mL/min/{1.73_m2} (ref >59)
Globulin, Total: 2.8 g/dL (ref 1.5–4.5)
Glucose: 111 mg/dL — ABNORMAL HIGH (ref 65–99)
Potassium: 4.3 mmol/L (ref 3.5–5.2)
Sodium: 140 mmol/L (ref 134–144)
Total Protein: 6.9 g/dL (ref 6.0–8.5)

## 2018-04-28 LAB — LIPID PANEL WITH LDL/HDL RATIO
CHOLESTEROL TOTAL: 150 mg/dL (ref 100–199)
HDL: 36 mg/dL — ABNORMAL LOW (ref >39)
LDL CALC: 86 mg/dL (ref 0–99)
LDL/HDL RATIO: 2.4 ratio (ref 0.0–3.2)
Triglycerides: 142 mg/dL (ref 0–149)
VLDL Cholesterol Cal: 28 mg/dL (ref 5–40)

## 2018-04-28 LAB — VITAMIN B12: VITAMIN B 12: 606 pg/mL (ref 232–1245)

## 2018-04-28 LAB — TSH: TSH: 0.377 u[IU]/mL — ABNORMAL LOW (ref 0.450–4.500)

## 2018-04-28 LAB — T4, FREE: Free T4: 1.06 ng/dL (ref 0.82–1.77)

## 2018-04-28 LAB — INSULIN, RANDOM: INSULIN: 46.1 u[IU]/mL — ABNORMAL HIGH (ref 2.6–24.9)

## 2018-04-28 LAB — HEMOGLOBIN A1C
Est. average glucose Bld gHb Est-mCnc: 120 mg/dL
HEMOGLOBIN A1C: 5.8 % — AB (ref 4.8–5.6)

## 2018-04-28 LAB — T3: T3 TOTAL: 201 ng/dL — AB (ref 71–180)

## 2018-04-28 LAB — VITAMIN D 25 HYDROXY (VIT D DEFICIENCY, FRACTURES): Vit D, 25-Hydroxy: 44.6 ng/mL (ref 30.0–100.0)

## 2018-04-28 LAB — FOLATE: FOLATE: 4.8 ng/mL (ref >3.0)

## 2018-05-11 ENCOUNTER — Ambulatory Visit: Payer: Medicaid Other | Admitting: Family Medicine

## 2018-05-11 ENCOUNTER — Encounter: Payer: Self-pay | Admitting: Family Medicine

## 2018-05-11 VITALS — BP 118/72 | HR 100 | Temp 97.6°F | Resp 16 | Ht 61.0 in | Wt 278.4 lb

## 2018-05-11 DIAGNOSIS — H66001 Acute suppurative otitis media without spontaneous rupture of ear drum, right ear: Secondary | ICD-10-CM | POA: Diagnosis not present

## 2018-05-11 DIAGNOSIS — H60331 Swimmer's ear, right ear: Secondary | ICD-10-CM

## 2018-05-11 MED ORDER — CIPROFLOXACIN-DEXAMETHASONE 0.3-0.1 % OT SUSP: 4.0000 [drp] | Freq: Two times a day (BID) | OTIC | 0 refills | Status: AC

## 2018-05-11 MED ORDER — AMOXICILLIN 500 MG PO CAPS: 500.0000 mg | ORAL_CAPSULE | Freq: Two times a day (BID) | ORAL | 0 refills | Status: AC

## 2018-05-11 NOTE — Patient Instructions (Signed)
Otitis Externa Otitis externa is an infection of the outer ear canal. The outer ear canal is the area between the outside of the ear and the eardrum. Otitis externa is sometimes called "swimmer's ear." Follow these instructions at home:  If you were given antibiotic ear drops, use them as told by your doctor. Do not stop using them even if your condition gets better.  Take over-the-counter and prescription medicines only as told by your doctor.  Keep all follow-up visits as told by your doctor. This is important. How is this prevented?  Keep your ear dry. Use the corner of a towel to dry your ear after you swim or bathe.  Try not to scratch or put things in your ear. Doing these things makes it easier for germs to grow in your ear.  Avoid swimming in lakes, dirty water, or pools that may not have the right amount of a chemical called chlorine.  Consider making ear drops and putting 3 or 4 drops in each ear after you swim. Ask your doctor about how you can make ear drops. Contact a doctor if:  You have a fever.  After 3 days your ear is still red, swollen, or painful.  After 3 days you still have pus coming from your ear.  Your redness, swelling, or pain gets worse.  You have a really bad headache.  You have redness, swelling, pain, or tenderness behind your ear. This information is not intended to replace advice given to you by your health care provider. Make sure you discuss any questions you have with your health care provider. Document Released: 02/10/2008 Document Revised: 09/19/2015 Document Reviewed: 06/03/2015 Elsevier Interactive Patient Education  2018 [employer].  Otitis Media, Adult Otitis media is redness, soreness, and puffiness (swelling) in the space just behind your eardrum (middle ear). It may be caused by allergies or infection. It often happens along with a cold. Follow these instructions at home:  Take your medicine as told. Finish it even if you start to  feel better.  Only take over-the-counter or prescription medicines for pain, discomfort, or fever as told by your doctor.  Follow up with your doctor as told. Contact a doctor if:  You have otitis media only in one ear, or bleeding from your nose, or both.  You notice a lump on your neck.  You are not getting better in 3-5 days.  You feel worse instead of better. Get help right away if:  You have pain that is not helped with medicine.  You have puffiness, redness, or pain around your ear.  You get a stiff neck.  You cannot move part of your face (paralysis).  You notice that the bone behind your ear hurts when you touch it. This information is not intended to replace advice given to you by your health care provider. Make sure you discuss any questions you have with your health care provider. Document Released: 02/10/2008 Document Revised: 01/30/2016 Document Reviewed: 03/21/2013 Elsevier Interactive Patient Education  2017 [employer].

## 2018-05-11 NOTE — Progress Notes (Signed)
Name: [name]   MRN: [id number]    DOB: [birth year]   Date:05/11/2018       Progress Note  Subjective  Chief Complaint  Chief Complaint  Patient presents with  . Otalgia    right, swollen and painful    HPI  Pt presents with ongoing right otalgia - she missed her ENT appointment last Friday but rescheduled.  She notes increased itching, feeling full in her ear, canal tenderness, decreased hearing in the right ear; she endorses fatigue.  Does not have chest pain, shortness of breath, nasal congestion, sore throat, fevers, or chills.  She was seen 04/25/2018 for the same illness - the pain never improved, and symptoms have only worsened.  She is taking singulair, allegra, and dymista daily without relief.  Patient Active Problem List   Diagnosis Date Noted  . Allergic rhinitis 03/09/2018  . Tension headache 03/09/2018  . Other fatigue 12/07/2017  . Shortness of breath on exertion 12/07/2017  . Other specified hypothyroidism 12/07/2017  . Prediabetes 10/15/2017  . Gastroesophageal reflux disease without esophagitis 10/15/2017  . Nasal valve collapse 10/15/2017  . 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/01/2017  . Chronic midline back pain 10/01/2017  . Vitamin D deficiency 10/01/2017  . Anxiety 10/01/2017  . PCOS (polycystic ovarian syndrome) 10/01/2017  . Severe episode of recurrent major depressive disorder, without psychotic features ([place]) 10/01/2017  . BMI 50.0-59.9, adult ([place]) 10/01/2017  . Fibromyalgia 10/01/2017  . Postoperative hypothyroidism 10/01/2017  . OSA (obstructive sleep apnea) 02/26/2014    Social History   Tobacco Use  . Smoking status: Former Smoker    Packs/day: 0.25    Years: 12.00    Pack years: 3.00    Types: Cigarettes    Last attempt to quit: 09/07/2004    Years since quitting: 13.6  . Smokeless tobacco: Never Used  Substance Use Topics  . Alcohol use: Yes    Comment: 4 times a year     Current Outpatient Medications:  .  acetaminophen  (TYLENOL) 325 MG tablet, Take 650 mg by mouth every 6 (six) hours as needed., Disp: , Rfl:  .  ARMOUR THYROID 120 MG tablet, TAKE 1 TABLET BY MOUTH ONCE DAILY BEFOREBREAKFAST, Disp: 90 tablet, Rfl: 0 .  Azelastine-Fluticasone 137-50 MCG/ACT SUSP, Place 1 spray into the nose daily., Disp: , Rfl:  .  citalopram (CELEXA) 20 MG tablet, Take 20 mg by mouth daily.  , Disp: , Rfl:  .  clonazePAM (KLONOPIN) 0.5 MG tablet, Take 0.5 mg by mouth 3 (three) times daily as needed for anxiety., Disp: , Rfl:  .  diclofenac (VOLTAREN) 75 MG EC tablet, Take 1 tablet (75 mg total) by mouth 2 (two) times daily as needed., Disp: 90 tablet, Rfl: 0 .  fexofenadine (ALLEGRA) 180 MG tablet, Take 1 tablet (180 mg total) by mouth daily., Disp: 90 tablet, Rfl: 1 .  gabapentin (NEURONTIN) 100 MG capsule, Take 100 mg by mouth 3 (three) times daily., Disp: , Rfl:  .  ibuprofen (ADVIL,MOTRIN) 200 MG tablet, Take 200 mg by mouth every 6 (six) hours as needed., Disp: , Rfl:  .  metFORMIN (GLUCOPHAGE) 500 MG tablet, Take 1 tablet (500 mg total) by mouth 2 (two) times daily with a meal., Disp: 60 tablet, Rfl: 0 .  montelukast (SINGULAIR) 10 MG tablet, Take 1 tablet (10 mg total) by mouth at bedtime., Disp: 30 tablet, Rfl: 3 .  omeprazole (PRILOSEC) 20 MG capsule, Take 20 mg by mouth daily., Disp: , Rfl:  .  VITAMIN D, CHOLECALCIFEROL, PO, Take 50 mcg by mouth 2 (two) times daily., Disp: , Rfl:  .  zolpidem (AMBIEN) 5 MG tablet, Take 10 mg by mouth at bedtime as needed. , Disp: , Rfl:   Allergies  Allergen Reactions  . Erythromycin   . Zyrtec [Cetirizine Hcl]     Only allergic to Zyrtec D -     ROS  Ten systems reviewed and is negative except as mentioned in HPI.  Objective  Vitals:   05/11/18 0738  BP: 118/72  Pulse: 100  Resp: 16  Temp: 97.6 F (36.4 C)  TempSrc: Oral  SpO2: 96%  Weight: 278 lb 6.4 oz (126.3 kg)  Height: 5\' 1"  (1.549 m)   Body mass index is 52.6 kg/m.  Nursing Note and Vital Signs  reviewed.  Physical Exam  Constitutional: Patient appears well-developed and well-nourished. Obese. No distress.  HEENT: head atraumatic, normocephalic, pupils equal and reactive to light, ears - RIGHT canal exhibits erythema, mild edema, and the right TM is injected, neck supple with mild submandibular lymphadenopathy, throat within normal limits.  Sinuses are non-tender. Cardiovascular: Normal rate, regular rhythm and normal heart sounds.  No murmur heard. No BLE edema. Pulmonary/Chest: Effort normal and breath sounds normal. No respiratory distress. Psychiatric: Patient has a normal mood and affect. behavior is normal. Judgment and thought content normal.  No results found for this or any previous visit (from the past 72 hour(s)).  Assessment & Plan  1. Acute swimmer's ear of right side - ciprofloxacin-dexamethasone (CIPRODEX) OTIC suspension; Place 4 drops into the right ear 2 (two) times daily for 7 days.  Dispense: 7.5 mL; Refill: 0  2. Non-recurrent acute suppurative otitis media of right ear without spontaneous rupture of tympanic membrane - amoxicillin (AMOXIL) 500 MG capsule; Take 1 capsule (500 mg total) by mouth 2 (two) times daily for 10 days.  Dispense: 20 capsule; Refill: 0  -Red flags and when to present for emergency care or RTC including fever >101.70F, chest pain, shortness of breath, new/worsening/un-resolving symptoms, reviewed with patient at time of visit. Follow up and care instructions discussed and provided in AVS.

## 2018-05-12 ENCOUNTER — Ambulatory Visit: Payer: Medicaid Other | Admitting: Physical Therapy

## 2018-05-13 ENCOUNTER — Encounter: Payer: Self-pay | Admitting: Family Medicine

## 2018-05-13 ENCOUNTER — Ambulatory Visit: Payer: Medicaid Other | Admitting: Family Medicine

## 2018-05-13 VITALS — BP 128/76 | HR 85 | Temp 98.6°F | Resp 16 | Ht 61.0 in | Wt 271.0 lb

## 2018-05-13 DIAGNOSIS — J4521 Mild intermittent asthma with (acute) exacerbation: Secondary | ICD-10-CM | POA: Diagnosis not present

## 2018-05-13 DIAGNOSIS — J069 Acute upper respiratory infection, unspecified: Secondary | ICD-10-CM | POA: Diagnosis not present

## 2018-05-13 MED ORDER — PREDNISONE 10 MG PO TABS: ORAL_TABLET | ORAL | 0 refills | Status: AC

## 2018-05-13 MED ORDER — ALBUTEROL SULFATE (2.5 MG/3ML) 0.083% IN NEBU: 2.5000 mg | INHALATION_SOLUTION | Freq: Four times a day (QID) | RESPIRATORY_TRACT | 1 refills | Status: DC | PRN

## 2018-05-13 MED ORDER — BENZONATATE 100 MG PO CAPS: 100.0000 mg | ORAL_CAPSULE | Freq: Three times a day (TID) | ORAL | 0 refills | Status: DC | PRN

## 2018-05-13 NOTE — Patient Instructions (Addendum)
Ibuprofen 600mg  (3-200mg  tablets) every 8 hours as needed for body aches, 1000mg  Tylenol every 8 hours as needed for body aches.  Upper Respiratory Infection, Adult Most upper respiratory infections (URIs) are caused by a virus. A URI affects the nose, throat, and upper air passages. The most common type of URI is often called "the common cold." Follow these instructions at home:  Take medicines only as told by your doctor.  Gargle warm saltwater or take cough drops to comfort your throat as told by your doctor.  Use a warm mist humidifier or inhale steam from a shower to increase air moisture. This may make it easier to breathe.  Drink enough fluid to keep your pee (urine) clear or pale yellow.  Eat soups and other clear broths.  Have a healthy diet.  Rest as needed.  Go back to work when your fever is gone or your doctor says it is okay. ? You may need to stay home longer to avoid giving your URI to others. ? You can also wear a face mask and wash your hands often to prevent spread of the virus.  Use your inhaler more if you have asthma.  Do not use any tobacco products, including cigarettes, chewing tobacco, or electronic cigarettes. If you need help quitting, ask your doctor. Contact a doctor if:  You are getting worse, not better.  Your symptoms are not helped by medicine.  You have chills.  You are getting more short of breath.  You have brown or red mucus.  You have yellow or brown discharge from your nose.  You have pain in your face, especially when you bend forward.  You have a fever.  You have puffy (swollen) neck glands.  You have pain while swallowing.  You have white areas in the back of your throat. Get help right away if:  You have very bad or constant: ? Headache. ? Ear pain. ? Pain in your forehead, behind your eyes, and over your cheekbones (sinus pain). ? Chest pain.  You have long-lasting (chronic) lung disease and any of the  following: ? Wheezing. ? Long-lasting cough. ? Coughing up blood. ? A change in your usual mucus.  You have a stiff neck.  You have changes in your: ? Vision. ? Hearing. ? Thinking. ? Mood. This information is not intended to replace advice given to you by your health care provider. Make sure you discuss any questions you have with your health care provider. Document Released: 02/10/2008 Document Revised: 04/26/2016 Document Reviewed: 11/29/2013 Elsevier Interactive Patient Education  2018 [employer] A cool mist vaporizer is a device that releases a cool mist into the air. If you have a cough or a cold, using a vaporizer may help relieve your symptoms. The mist adds moisture to the air, which may help thin your mucus and make it less sticky. When your mucus is thin and less sticky, it easier for you to breathe and to cough up secretions. Do not use a vaporizer if you are allergic to mold. Follow these instructions at home:  Follow the instructions that come with the vaporizer.  Do not use anything other than distilled water in the vaporizer.  Do not run the vaporizer all of the time. Doing that can cause mold or bacteria to grow in the vaporizer.  Clean the vaporizer after each time that you use it.  Clean and dry the vaporizer well before storing it.  Stop using the vaporizer if  your breathing symptoms get worse. This information is not intended to replace advice given to you by your health care provider. Make sure you discuss any questions you have with your health care provider. Document Released: 05/21/2004 Document Revised: 03/13/2016 Document Reviewed: 11/23/2015 Elsevier Interactive Patient Education  [employer].

## 2018-05-13 NOTE — Progress Notes (Signed)
Name: [name]   MRN: [id number]    DOB: [birth year]   Date:05/13/2018       Progress Note  Subjective  Chief Complaint  Chief Complaint  Patient presents with  . Generalized Body Aches    chills, cough    HPI  Pt presents with concern for body aches, chills, cough x2-3 days.  She was seen 2 days ago for otitis externa and otitis media - using ciprodex drogs and taking Amoxicillin as prescribed.  Notes right ear is feeling better, but other symptoms are causing her concern.  Cough is intermittent, dry, and non-productive. Denies chest pain, shortness of breath, NVD, abdominal pain. She does note a history of asthma and has an expired albuterol inhaler which she has not been using.  No recent known sick contacts, but she was out of town last weekend and is concerned she may have picked up a virus.  Patient Active Problem List   Diagnosis Date Noted  . Allergic rhinitis 03/09/2018  . Tension headache 03/09/2018  . Other fatigue 12/07/2017  . Shortness of breath on exertion 12/07/2017  . Other specified hypothyroidism 12/07/2017  . Prediabetes 10/15/2017  . Gastroesophageal reflux disease without esophagitis 10/15/2017  . Nasal valve collapse 10/15/2017  . 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/01/2017  . Chronic midline back pain 10/01/2017  . Vitamin D deficiency 10/01/2017  . Anxiety 10/01/2017  . PCOS (polycystic ovarian syndrome) 10/01/2017  . Severe episode of recurrent major depressive disorder, without psychotic features ([place]) 10/01/2017  . BMI 50.0-59.9, adult ([facility]) 10/01/2017  . Fibromyalgia 10/01/2017  . Postoperative hypothyroidism 10/01/2017  . OSA (obstructive sleep apnea) 02/26/2014    Social History   Tobacco Use  . Smoking status: Former Smoker    Packs/day: 0.25    Years: 12.00    Pack years: 3.00    Types: Cigarettes    Last attempt to quit: 09/07/2004    Years since quitting: 13.6  . Smokeless tobacco: Never Used  Substance Use Topics  . Alcohol  use: Yes    Comment: 4 times a year     Current Outpatient Medications:  .  acetaminophen (TYLENOL) 325 MG tablet, Take 650 mg by mouth every 6 (six) hours as needed., Disp: , Rfl:  .  amoxicillin (AMOXIL) 500 MG capsule, Take 1 capsule (500 mg total) by mouth 2 (two) times daily for 10 days., Disp: 20 capsule, Rfl: 0 .  ARMOUR THYROID 120 MG tablet, TAKE 1 TABLET BY MOUTH ONCE DAILY BEFOREBREAKFAST, Disp: 90 tablet, Rfl: 0 .  Azelastine-Fluticasone 137-50 MCG/ACT SUSP, Place 1 spray into the nose daily., Disp: , Rfl:  .  ciprofloxacin-dexamethasone (CIPRODEX) OTIC suspension, Place 4 drops into the right ear 2 (two) times daily for 7 days., Disp: 7.5 mL, Rfl: 0 .  citalopram (CELEXA) 20 MG tablet, Take 20 mg by mouth daily.  , Disp: , Rfl:  .  clonazePAM (KLONOPIN) 0.5 MG tablet, Take 0.5 mg by mouth 3 (three) times daily as needed for anxiety., Disp: , Rfl:  .  diclofenac (VOLTAREN) 75 MG EC tablet, Take 1 tablet (75 mg total) by mouth 2 (two) times daily as needed., Disp: 90 tablet, Rfl: 0 .  fexofenadine (ALLEGRA) 180 MG tablet, Take 1 tablet (180 mg total) by mouth daily., Disp: 90 tablet, Rfl: 1 .  gabapentin (NEURONTIN) 100 MG capsule, Take 100 mg by mouth 3 (three) times daily., Disp: , Rfl:  .  ibuprofen (ADVIL,MOTRIN) 200 MG tablet, Take 200 mg by mouth  every 6 (six) hours as needed., Disp: , Rfl:  .  metFORMIN (GLUCOPHAGE) 500 MG tablet, Take 1 tablet (500 mg total) by mouth 2 (two) times daily with a meal., Disp: 60 tablet, Rfl: 0 .  montelukast (SINGULAIR) 10 MG tablet, Take 1 tablet (10 mg total) by mouth at bedtime., Disp: 30 tablet, Rfl: 3 .  omeprazole (PRILOSEC) 20 MG capsule, Take 20 mg by mouth daily., Disp: , Rfl:  .  VITAMIN D, CHOLECALCIFEROL, PO, Take 50 mcg by mouth 2 (two) times daily., Disp: , Rfl:  .  zolpidem (AMBIEN) 5 MG tablet, Take 10 mg by mouth at bedtime as needed. , Disp: , Rfl:   Allergies  Allergen Reactions  . Erythromycin   . Zyrtec [Cetirizine Hcl]      Only allergic to Zyrtec D -     ROS  Ten systems reviewed and is negative except as mentioned in HPI.  Objective  Vitals:   05/13/18 1517  BP: 128/76  Pulse: 85  Resp: 16  Temp: 98.6 F (37 C)  TempSrc: Oral  SpO2: 98%  Weight: 271 lb (122.9 kg)  Height: 5\' 1"  (1.549 m)   Body mass index is 51.21 kg/m.  Nursing Note and Vital Signs reviewed.  Physical Exam  Constitutional: Patient appears well-developed and well-nourished. No distress.  HENT: Head: Normocephalic and atraumatic. Ears: R TM mildly erythematous but improved from last visit, RIGHT canal is slightly erythematous and edema has decreased. L TM - no erythema or effusion; Nose: Nose normal. Mouth/Throat: Oropharynx is clear and moist. No oropharyngeal exudate, edema, or erythema.  Eyes: Conjunctivae and EOM are normal. Pupils are equal, round, and reactive to light. No scleral icterus.  Neck: Normal range of motion. Neck supple. No JVD present. No thyromegaly present.  Cardiovascular: Normal rate, regular rhythm and normal heart sounds.  No murmur heard. No BLE edema. Pulmonary/Chest: Effort normal and breath sounds normal. No respiratory distress. Dry cough, irritated with deep inspiration on examination. Musculoskeletal: Normal range of motion, no joint effusions. No gross deformities Neurological: he is alert and oriented to person, place, and time. No cranial nerve deficit. Coordination, balance, strength, speech and gait are normal.  Skin: Skin is warm and dry. No rash noted. No erythema.  Psychiatric: Patient has a normal mood and affect. behavior is normal. Judgment and thought content normal.  No results found for this or any previous visit (from the past 72 hour(s)).  Assessment & Plan  1. Upper respiratory tract infection, unspecified type - benzonatate (TESSALON PERLES) 100 MG capsule; Take 1 capsule (100 mg total) by mouth 3 (three) times daily as needed.  Dispense: 20 capsule; Refill: 0 -  albuterol (PROVENTIL) (2.5 MG/3ML) 0.083% nebulizer solution; Take 3 mLs (2.5 mg total) by nebulization every 6 (six) hours as needed for wheezing or shortness of breath.  Dispense: 150 mL; Refill: 1 - Alternate Tylenol/Advil PRN for body aches.  2. Mild intermittent asthma with acute exacerbation - albuterol (PROVENTIL) (2.5 MG/3ML) 0.083% nebulizer solution; Take 3 mLs (2.5 mg total) by nebulization every 6 (six) hours as needed for wheezing or shortness of breath.  Dispense: 150 mL; Refill: 1 - predniSONE (DELTASONE) 10 MG tablet; Take 5 tablets (50 mg total) by mouth daily with breakfast for 1 day, THEN 4 tablets (40 mg total) daily with breakfast for 1 day, THEN 3 tablets (30 mg total) daily with breakfast for 1 day, THEN 2 tablets (20 mg total) daily with breakfast for 1 day, THEN 1 tablet (  10 mg total) daily with breakfast for 1 day.  Dispense: 15 tablet; Refill: 0  -Red flags and when to present for emergency care or RTC including fever >101.67F, chest pain, shortness of breath, new/worsening/un-resolving symptoms, reviewed with patient at time of visit. Follow up and care instructions discussed and provided in AVS.

## 2018-05-18 ENCOUNTER — Ambulatory Visit (INDEPENDENT_AMBULATORY_CARE_PROVIDER_SITE_OTHER): Payer: Medicaid Other | Admitting: Family Medicine

## 2018-05-18 VITALS — BP 113/78 | HR 77 | Temp 97.5°F | Ht 61.0 in | Wt 271.0 lb

## 2018-05-18 DIAGNOSIS — R7303 Prediabetes: Secondary | ICD-10-CM

## 2018-05-18 DIAGNOSIS — Z6841 Body Mass Index (BMI) 40.0 and over, adult: Secondary | ICD-10-CM | POA: Diagnosis not present

## 2018-05-19 NOTE — Progress Notes (Signed)
Office: [phone]  /  Fax: [phone]   HPI:   Chief Complaint: OBESITY [name] is here to discuss her progress with her obesity treatment plan. She is on the Category 3 plan and is following her eating plan approximately 60 % of the time. She states she is exercising 0 minutes 0 times per week. Seng has done well maintaining weight. She notes increased stress at home and decreased meal planning. She states she gained approximately 5 lbs but has taken that off already.  Her weight is 271 lb (122.9 kg) today and has not lost weight since her last visit. She has lost 21 lbs since starting treatment with [place].  Pre-Diabetes [name] has a diagnosis of pre-diabetes based on her elevated Hgb A1c and was informed this puts her at greater risk of developing diabetes. She is stable on metformin and denies nausea, vomiting, or hypoglycemia. She is working on diet and exercise to decrease risk of diabetes.   ALLERGIES: Allergies  Allergen Reactions  . Erythromycin   . Zyrtec [Cetirizine Hcl]     Only allergic to Zyrtec D -     MEDICATIONS: Current Outpatient Medications on File Prior to Visit  Medication Sig Dispense Refill  . acetaminophen (TYLENOL) 325 MG tablet Take 650 mg by mouth every 6 (six) hours as needed.    [name] Kitchen albuterol (PROVENTIL) (2.5 MG/3ML) 0.083% nebulizer solution Take 3 mLs (2.5 mg total) by nebulization every 6 (six) hours as needed for wheezing or shortness of breath. 150 mL 1  . amoxicillin (AMOXIL) 500 MG capsule Take 1 capsule (500 mg total) by mouth 2 (two) times daily for 10 days. 20 capsule 0  . ARMOUR THYROID 120 MG tablet TAKE 1 TABLET BY MOUTH ONCE DAILY BEFOREBREAKFAST 90 tablet 0  . Azelastine-Fluticasone 137-50 MCG/ACT SUSP Place 1 spray into the nose daily.    . benzonatate (TESSALON PERLES) 100 MG capsule Take 1 capsule (100 mg total) by mouth 3 (three) times daily as needed. 20 capsule 0  . citalopram (CELEXA) 20 MG tablet Take 20 mg by mouth daily.      .  clonazePAM (KLONOPIN) 0.5 MG tablet Take 0.5 mg by mouth 3 (three) times daily as needed for anxiety.    . diclofenac (VOLTAREN) 75 MG EC tablet Take 1 tablet (75 mg total) by mouth 2 (two) times daily as needed. 90 tablet 0  . fexofenadine (ALLEGRA) 180 MG tablet Take 1 tablet (180 mg total) by mouth daily. 90 tablet 1  . gabapentin (NEURONTIN) 100 MG capsule Take 100 mg by mouth 3 (three) times daily.    [name] Kitchen ibuprofen (ADVIL,MOTRIN) 200 MG tablet Take 200 mg by mouth every 6 (six) hours as needed.    . metFORMIN (GLUCOPHAGE) 500 MG tablet Take 1 tablet (500 mg total) by mouth 2 (two) times daily with a meal. 60 tablet 0  . montelukast (SINGULAIR) 10 MG tablet Take 1 tablet (10 mg total) by mouth at bedtime. 30 tablet 3  . omeprazole (PRILOSEC) 20 MG capsule Take 20 mg by mouth daily.    [name] Kitchen VITAMIN D, CHOLECALCIFEROL, PO Take 50 mcg by mouth 2 (two) times daily.    [name] Kitchen zolpidem (AMBIEN) 5 MG tablet Take 10 mg by mouth at bedtime as needed.      No current facility-administered medications on file prior to visit.     PAST MEDICAL HISTORY: Past Medical History:  Diagnosis Date  . Asthma   . Chronic fatigue   . Chronic pain  back and hips  . Fibromyalgia   . High cholesterol   . Insomnia   . Prediabetes   . Scoliosis   . Sleep apnea     PAST SURGICAL HISTORY: Past Surgical History:  Procedure Laterality Date  . ABDOMINAL HYSTERECTOMY    . CESAREAN SECTION    . CHOLECYSTECTOMY    . SPINAL FUSION    . THYROIDECTOMY    . TONSILLECTOMY    . TUBAL LIGATION      SOCIAL HISTORY: Social History   Tobacco Use  . Smoking status: Former Smoker    Packs/day: 0.25    Years: 12.00    Pack years: 3.00    Types: Cigarettes    Last attempt to quit: 09/07/2004    Years since quitting: 13.7  . Smokeless tobacco: Never Used  Substance Use Topics  . Alcohol use: Yes    Comment: 4 times a year  . Drug use: No    FAMILY HISTORY: Family History  Problem Relation Age of Onset  . Cancer  Maternal Uncle   . Heart disease Father   . High Cholesterol Father   . Heart disease Paternal Grandfather   . Breast cancer Maternal Grandmother   . Rheum arthritis Mother   . Obesity Mother   . Rheum arthritis Maternal Grandfather   . Rheum arthritis Maternal Aunt     ROS: Review of Systems  Constitutional: Negative for weight loss.  Gastrointestinal: Negative for nausea and vomiting.  Endo/Heme/Allergies:       Negative hypoglycemia    PHYSICAL EXAM: Blood pressure 113/78, pulse 77, temperature (!) 97.5 F (36.4 C), temperature source Oral, height 5\' 1"  (1.549 m), weight 271 lb (122.9 kg), SpO2 96 %. Body mass index is 51.21 kg/m. Physical Exam  Constitutional: She is oriented to person, place, and time. She appears well-developed and well-nourished.  Cardiovascular: Normal rate.  Pulmonary/Chest: Effort normal.  Musculoskeletal: Normal range of motion.  Neurological: She is oriented to person, place, and time.  Skin: Skin is warm and dry.  Psychiatric: She has a normal mood and affect. Her behavior is normal.  Vitals reviewed.   RECENT LABS AND TESTS: BMET    Component Value Date/Time   NA 140 04/27/2018 1144   K 4.3 04/27/2018 1144   CL 107 (H) 04/27/2018 1144   CO2 20 04/27/2018 1144   GLUCOSE 111 (H) 04/27/2018 1144   GLUCOSE 90 10/01/2017 1041   BUN 17 04/27/2018 1144   CREATININE 0.57 04/27/2018 1144   CREATININE 0.53 10/01/2017 1041   CALCIUM 8.8 04/27/2018 1144   GFRNONAA 114 04/27/2018 1144   GFRNONAA 116 10/01/2017 1041   GFRAA 131 04/27/2018 1144   GFRAA 135 10/01/2017 1041   Lab Results  Component Value Date   HGBA1C 5.8 (H) 04/27/2018   HGBA1C 5.8 (H) 12/07/2017   HGBA1C 6.0 (H) 10/01/2017   Lab Results  Component Value Date   INSULIN 46.1 (H) 04/27/2018   INSULIN 20.1 12/07/2017   CBC    Component Value Date/Time   WBC 5.6 04/27/2018 1144   WBC 7.0 10/01/2017 1041   RBC 4.85 04/27/2018 1144   RBC 5.02 10/01/2017 1041   HGB 13.4  04/27/2018 1144   HCT 42.9 04/27/2018 1144   PLT 244 10/01/2017 1041   MCV 89 04/27/2018 1144   MCH 27.6 04/27/2018 1144   MCH 28.3 10/01/2017 1041   MCHC 31.2 (L) 04/27/2018 1144   MCHC 34.5 10/01/2017 1041   RDW 14.1 04/27/2018 1144  LYMPHSABS 1.3 04/27/2018 1144   EOSABS 0.1 04/27/2018 1144   BASOSABS 0.0 04/27/2018 1144   Iron/TIBC/Ferritin/ %Sat No results found for: IRON, TIBC, FERRITIN, IRONPCTSAT Lipid Panel     Component Value Date/Time   CHOL 150 04/27/2018 1144   TRIG 142 04/27/2018 1144   HDL 36 (L) 04/27/2018 1144   CHOLHDL 2.9 10/08/2017 1103   LDLCALC 86 04/27/2018 1144   LDLCALC 69 10/08/2017 1103   Hepatic Function Panel     Component Value Date/Time   PROT 6.9 04/27/2018 1144   ALBUMIN 4.1 04/27/2018 1144   AST 22 04/27/2018 1144   ALT 35 (H) 04/27/2018 1144   ALKPHOS 75 04/27/2018 1144   BILITOT <0.2 04/27/2018 1144      Component Value Date/Time   TSH 0.377 (L) 04/27/2018 1144   TSH 1.890 02/15/2018 1137   TSH 0.009 (L) 12/07/2017 1204    ASSESSMENT AND PLAN: Prediabetes  Class 3 severe obesity with serious comorbidity and body mass index (BMI) of 50.0 to 59.9 in adult, unspecified obesity type ([place])  PLAN:  Pre-Diabetes Elexius will continue to work on weight loss, exercise, and decreasing simple carbohydrates in her diet to help decrease the risk of diabetes. We dicussed metformin including benefits and risks. She was informed that eating too many simple carbohydrates or too many calories at one sitting increases the likelihood of GI side effects. Panagiota agrees to continue taking metformin and she agrees to follow up with  as directed to monitor her progress.  I spent > than 50% of the 15 minute visit on counseling as documented in the note.  Chaka is currently in the action stage of change. As such, her goal is to continue with weight loss efforts She has agreed to follow the Category 3 plan Amberley has been instructed to work up to a goal  of 150 minutes of combined cardio and strengthening exercise per week for weight loss and overall health benefits. We discussed the following Behavioral Modification Strategies today: increasing lean protein intake, decreasing simple carbohydrates  and work on meal planning and easy cooking plans   Elener has agreed to follow up with our clinic in 3 to 4 weeks. She was informed of the portance of frequent follow up visits to maximize her success with intensive lifestyle modifications for her multiple health conditions.   OBESITY BEHAVIORAL INTERVENTION VISIT  Today's visit was # 10   Starting weight: 292 lbs Starting date: 12/07/17 Today's weight : 271 lbs Today's date: 05/18/2018 Total lbs lost to date: 21    ASK: We discussed the diagnosis of obesity with [name] today and Prudence agreed to give [place] permission to discuss obesity behavioral modification therapy today.  ASSESS: Jaleigh has the diagnosis of obesity and her BMI today is 51.23 Taliya is in the action stage of change   ADVISE: Mayukha was educated on the multiple health risks of obesity as well as the benefit of weight loss to improve her health. She was advised of the need for long term treatment and the importance of lifestyle modifications to improve her current health and to decrease her risk of future health problems.  AGREE: Multiple dietary modification options and treatment options were discussed and  Alisabeth agreed to follow the recommendations documented in the above note.  ARRANGE: Ashtyn was educated on the importance of frequent visits to treat obesity as outlined per CMS and USPSTF guidelines and agreed to schedule her next follow up appointment today.  Wilhemena Durie,  am acting as transcriptionist for Dennard Nip, MD  I have reviewed the above documentation for accuracy and completeness, and I agree with the above. -Dennard Nip, MD

## 2018-05-21 ENCOUNTER — Encounter (INDEPENDENT_AMBULATORY_CARE_PROVIDER_SITE_OTHER): Payer: Self-pay | Admitting: Family Medicine

## 2018-05-25 ENCOUNTER — Ambulatory Visit: Payer: Medicaid Other | Attending: Family Medicine | Admitting: Physical Therapy

## 2018-05-25 DIAGNOSIS — M4125 Other idiopathic scoliosis, thoracolumbar region: Secondary | ICD-10-CM | POA: Insufficient documentation

## 2018-05-25 DIAGNOSIS — M791 Myalgia, unspecified site: Secondary | ICD-10-CM | POA: Diagnosis present

## 2018-05-25 DIAGNOSIS — R2689 Other abnormalities of gait and mobility: Secondary | ICD-10-CM

## 2018-05-25 DIAGNOSIS — M4123 Other idiopathic scoliosis, cervicothoracic region: Secondary | ICD-10-CM | POA: Diagnosis present

## 2018-05-25 NOTE — Therapy (Signed)
[place] MAIN [facility] SERVICES [address] [place], [place], [address] Phone: [phone]   Fax:  [phone]  Physical Therapy Treatment  Patient Details  Name: [name] MRN: [id number] Date of Birth: Jul 20, 1976 Referring Provider: Raelyn Ensign, MD    Encounter Date: 05/25/2018  PT End of Session - 05/25/18 1503    Visit Number  4    Number of Visits  12    Date for PT Re-Evaluation  06/16/18    PT Start Time  4315    PT Stop Time  4008    PT Time Calculation (min)  42 min    Activity Tolerance  Patient tolerated treatment well;No increased pain    Behavior During Therapy  WFL for tasks assessed/performed       Past Medical History:  Diagnosis Date  . Asthma   . Chronic fatigue   . Chronic pain    back and hips  . Fibromyalgia   . High cholesterol   . Insomnia   . Prediabetes   . Scoliosis   . Sleep apnea     Past Surgical History:  Procedure Laterality Date  . ABDOMINAL HYSTERECTOMY    . CESAREAN SECTION    . CHOLECYSTECTOMY    . SPINAL FUSION    . THYROIDECTOMY    . TONSILLECTOMY    . TUBAL LIGATION      There were no vitals filed for this visit.  Subjective Assessment - 05/25/18 1414    Subjective  Pt has been keeping a shoe lift in different shoes. Pt has not had any migraines across 3 weeks. Pt has been feeling more low back mm spasms with movements with changing positions: Sstanding to sitting, and sitting to laying.     Pertinent History  2 vaginal deliveries, 2 twin via C-section          [facility] PT Assessment - 05/25/18 1419      Single Leg Stance   Comments  stability no trendelenberg on either leg,       Palpation   SI assessment   L PSIS more anterior, tenderness at SIJ                    [facility] Adult PT Treatment/Exercise - 05/25/18 1426      Therapeutic Activites    Therapeutic Activities  --   see pt isntructions     Neuro Re-ed    Neuro Re-ed Details   see pt instructions        Manual Therapy   Manual therapy comments  long axis distraction o nLE, posterior mob to SIJ L in sidelying,              PT Education - 05/25/18 1447    Education Details  HEP    Person(s) Educated  Patient    Methods  Explanation;Tactile cues;Verbal cues;Handout;Demonstration    Comprehension  Verbalized understanding;Returned demonstration          PT Long Term Goals - 04/26/18 1113      PT LONG TERM GOAL #1   Title  Pt will report being able to sleep on her R side without waking up with a HA across 2-3 nights / week for 1 week in order improve sleep quality     Baseline  HA upon waking    Time  8    Period  Weeks    Status  On-going      PT LONG TERM  GOAL #2   Title  Pt will be able to lie on her L side with L LBP < 2/10 in order to improve sleep     Baseline  LBP at 6/10 when lying on L side     Time  8    Period  Weeks    Status  On-going      PT LONG TERM GOAL #3   Title  Pt will decrease ODI score <22 % in order to go on field trips with children      Baseline  32%     Time  12    Period  Weeks    Status  On-going      PT LONG TERM GOAL #4   Title  R sidebend of spine will increase (from nailbed of digit III to floor  56 cm) and R rotation 45%    Baseline  R sidebend from nailbed of digit III to floor 49 cm, L 56 cm.  R rotation ~25 %     Time  2    Period  Weeks    Status  On-going      PT LONG TERM GOAL #5   Title  Pt will demo no cervical mm tightness of L , thoracic tightness of R interspinals in order to perform household chores and walk     Time  6    Period  Weeks    Status  Achieved      Additional Long Term Goals   Additional Long Term Goals  Yes      PT LONG TERM GOAL #6   Title  Pt will decrease her NDI score to < 27% in order to drive and perform ADLs    Baseline  37%     Time  12    Period  Weeks    Status  On-going      PT LONG TERM GOAL #7   Title  Pt will be IND with scoliosis HEP to manage pain    Time  8    Period   Weeks    Status  New    Target Date  06/21/18            Plan - 05/25/18 1504    Clinical Impression Statement  Pt continues to have resolved migraines across the past weeks. Focused on lumbar curve and SIJ where her current is located. Provided manual Tx and specific scoliosis HEP to minimize shortening of L lumbar concave side of curve and addressed the rotation component of her curve. Pt demo'd correct HEP. Pt continues to benefit frmo skilled PT. Pt LBP decreased from 7/10 to 4/10 post Tx and demo'd less tenderness at L PSIS with better alignment.    Rehab Potential  Good    PT Frequency  1x / week    PT Treatment/Interventions  Manual lymph drainage;Energy conservation;Scar mobilization;Taping;Neuromuscular re-education;Balance training;Therapeutic exercise;Therapeutic activities;Patient/family education;Aquatic Therapy;ADLs/Self Care Home Management;Gait training;Stair training;Electrical Stimulation    Consulted and Agree with Plan of Care  Patient       Patient will benefit from skilled therapeutic intervention in order to improve the following deficits and impairments:  Decreased scar mobility, Decreased range of motion, Decreased strength, Decreased safety awareness, Decreased activity tolerance, Increased muscle spasms, Improper body mechanics, Increased fascial restricitons, Hypermobility, Postural dysfunction, Pain, Difficulty walking, Decreased endurance, Decreased balance  Visit Diagnosis: Other idiopathic scoliosis, thoracolumbar region  Other idiopathic scoliosis, cervicothoracic region  Myalgia  Other abnormalities of gait and mobility  Problem List Patient Active Problem List   Diagnosis Date Noted  . Mild intermittent asthma with acute exacerbation 05/13/2018  . Allergic rhinitis 03/09/2018  . Tension headache 03/09/2018  . Other fatigue 12/07/2017  . Shortness of breath on exertion 12/07/2017  . Other specified hypothyroidism 12/07/2017  .  Prediabetes 10/15/2017  . Gastroesophageal reflux disease without esophagitis 10/15/2017  . Nasal valve collapse 10/15/2017  . 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/01/2017  . Chronic midline back pain 10/01/2017  . Vitamin D deficiency 10/01/2017  . Anxiety 10/01/2017  . PCOS (polycystic ovarian syndrome) 10/01/2017  . Severe episode of recurrent major depressive disorder, without psychotic features ([place]) 10/01/2017  . BMI 50.0-59.9, adult ([place]) 10/01/2017  . Fibromyalgia 10/01/2017  . Postoperative hypothyroidism 10/01/2017  . OSA (obstructive sleep apnea) 02/26/2014    Jerl Mina ,PT, DPT, E-RYT  05/25/2018, 3:06 PM  [place] MAIN [facility] SERVICES [address] [place], [place], [address] Phone: [phone]   Fax:  [phone]  Name: [name] MRN: [id number] Date of Birth: 1976-06-04

## 2018-05-25 NOTE — Patient Instructions (Addendum)
Scoliosis specific exercises for the L low back:    Finding a comfortable position when laying on your back  Laying on your back, lift hips up, then scoot tail under, lowering ribs / midback first, then the low back   _ Keep R foot down, R knee bent, Both back of the hips down,  L knee to chest with towel thigh to pull   10 reps on exhale    ________    REVERSE Warrior    : to stretch L low back     Feet are hip width apart, L foot one behind like you are on ski tracks,  R knee bent over ankle but not more forward then the ankle.  Make sure 50% weight is in the front foot/leg , 50% weight is the back foot/ leg     L hand on R thigh, R hand on wall  Inhale lengthen spine   Exhale turn navel to the R then the ribcage turns, Keeping the hips squared and turning to the R   Keep maintaining  50% weight is in the front foot/leg , 50% weight is the back foot/ leg  And make sure the front knee is still pointed in the toe line of the 2nd toe.    5 breaths here. X 3 x day    ________  Strengthening upper back   Wall lean for scoliosis   L Forearm slide against wall as you stand perpendicular to wall, band under feet, feet hip width apart,   Opposite elbow by side, , imagine holding pencil under armpit as you lean toward wall, lowering forearm against the wall and opposite hand pulls band without letting elbow move away from side body  And slide back up , straightening body    Make sure the upper trapezius muscle does not hike up to ear as band is being pulled as you lean towards wall    band  10 x 2    Leaning R  only to L flank area    ____   Proper body mechanics with getting out of a chair to decrease strain  on back &pelvic floor   Avoid holding your breath when Getting out of the chair:  Scoot to front part of chair chair Heels behind feet, feet are hip width apart, nose over toes  Inhale like you are smelling roses Exhale to stand

## 2018-06-01 ENCOUNTER — Other Ambulatory Visit: Payer: Self-pay | Admitting: Family Medicine

## 2018-06-01 DIAGNOSIS — M797 Fibromyalgia: Secondary | ICD-10-CM

## 2018-06-02 ENCOUNTER — Ambulatory Visit: Payer: Medicaid Other | Admitting: Physical Therapy

## 2018-06-08 ENCOUNTER — Ambulatory Visit: Payer: Medicaid Other | Attending: Family Medicine | Admitting: Physical Therapy

## 2018-06-09 ENCOUNTER — Encounter: Payer: Medicaid Other | Admitting: Family Medicine

## 2018-06-09 ENCOUNTER — Encounter: Payer: Self-pay | Admitting: Family Medicine

## 2018-06-09 NOTE — Progress Notes (Signed)
Erroneous Encounter

## 2018-06-13 ENCOUNTER — Ambulatory Visit (INDEPENDENT_AMBULATORY_CARE_PROVIDER_SITE_OTHER): Payer: Medicaid Other | Admitting: Bariatrics

## 2018-06-13 VITALS — BP 112/76 | HR 78 | Temp 97.7°F | Ht 61.0 in | Wt 270.0 lb

## 2018-06-13 DIAGNOSIS — G4709 Other insomnia: Secondary | ICD-10-CM | POA: Diagnosis not present

## 2018-06-13 DIAGNOSIS — Z6841 Body Mass Index (BMI) 40.0 and over, adult: Secondary | ICD-10-CM

## 2018-06-13 DIAGNOSIS — R7303 Prediabetes: Secondary | ICD-10-CM | POA: Diagnosis not present

## 2018-06-13 DIAGNOSIS — E559 Vitamin D deficiency, unspecified: Secondary | ICD-10-CM | POA: Diagnosis not present

## 2018-06-14 NOTE — Progress Notes (Signed)
Office: [phone]  /  Fax: [phone]   HPI:   Chief Complaint: OBESITY [name] is here to discuss her progress with her obesity treatment plan. She is on the Category 3 plan and is following her eating plan approximately 60 % of the time. She states she is exercising 0 minutes 0 times per week. [name] is doing ok with Category 3. Her hunger is ok with no significant cravings. There have been an "increase in birthday parties, etc" over the last week. She is having some insomnia.  Her weight is 270 lb (122.5 kg) today and has had a weight loss of 1 pound over a period of 3 weeks since her last visit. She has lost 22 lbs since starting treatment with [place].  Pre-Diabetes [name] has a diagnosis of pre-diabetes based on her elevated Hgb A1c and was informed this puts her at greater risk of developing diabetes. Her last A1c was 5.8 and Insulin was 46.1 on 04/27/18. She is taking metformin currently and continues to work on diet and exercise to decrease risk of diabetes. She denies GI side effects.  Vitamin D deficiency [name] has a diagnosis of vitamin D deficiency. She is currently taking OTC vit D and denies nausea, vomiting or muscle weakness.  ALLERGIES: Allergies  Allergen Reactions  . Erythromycin   . Zyrtec [Cetirizine Hcl]     Only allergic to Zyrtec D -     MEDICATIONS: Current Outpatient Medications on File Prior to Visit  Medication Sig Dispense Refill  . acetaminophen (TYLENOL) 325 MG tablet Take 650 mg by mouth every 6 (six) hours as needed.    [name] Kitchen albuterol (PROVENTIL) (2.5 MG/3ML) 0.083% nebulizer solution Take 3 mLs (2.5 mg total) by nebulization every 6 (six) hours as needed for wheezing or shortness of breath. 150 mL 1  . ARMOUR THYROID 120 MG tablet TAKE 1 TABLET BY MOUTH ONCE DAILY BEFOREBREAKFAST 90 tablet 0  . Azelastine-Fluticasone 137-50 MCG/ACT SUSP Place 1 spray into the nose daily.    . benzonatate (TESSALON PERLES) 100 MG capsule Take 1 capsule (100 mg total) by  mouth 3 (three) times daily as needed. 20 capsule 0  . citalopram (CELEXA) 20 MG tablet Take 20 mg by mouth daily.      . clonazePAM (KLONOPIN) 0.5 MG tablet Take 0.5 mg by mouth 3 (three) times daily as needed for anxiety.    . diclofenac (VOLTAREN) 75 MG EC tablet TAKE 1 TABLET BY MOUTH 2 TIMES DAILY AS NEEDED 90 tablet 0  . fexofenadine (ALLEGRA) 180 MG tablet Take 1 tablet (180 mg total) by mouth daily. 90 tablet 1  . gabapentin (NEURONTIN) 100 MG capsule Take 100 mg by mouth 3 (three) times daily.    [name] Kitchen ibuprofen (ADVIL,MOTRIN) 200 MG tablet Take 200 mg by mouth every 6 (six) hours as needed.    . metFORMIN (GLUCOPHAGE) 500 MG tablet Take 1 tablet (500 mg total) by mouth 2 (two) times daily with a meal. 60 tablet 0  . montelukast (SINGULAIR) 10 MG tablet Take 1 tablet (10 mg total) by mouth at bedtime. 30 tablet 3  . omeprazole (PRILOSEC) 20 MG capsule Take 20 mg by mouth daily.    [name] Kitchen VITAMIN D, CHOLECALCIFEROL, PO Take 50 mcg by mouth 2 (two) times daily.    [name] Kitchen zolpidem (AMBIEN) 5 MG tablet Take 10 mg by mouth at bedtime as needed.      No current facility-administered medications on file prior to visit.     PAST MEDICAL HISTORY:  Past Medical History:  Diagnosis Date  . Asthma   . Chronic fatigue   . Chronic pain    back and hips  . Fibromyalgia   . High cholesterol   . Insomnia   . Prediabetes   . Scoliosis   . Sleep apnea     PAST SURGICAL HISTORY: Past Surgical History:  Procedure Laterality Date  . ABDOMINAL HYSTERECTOMY    . CESAREAN SECTION    . CHOLECYSTECTOMY    . SPINAL FUSION    . THYROIDECTOMY    . TONSILLECTOMY    . TUBAL LIGATION      SOCIAL HISTORY: Social History   Tobacco Use  . Smoking status: Former Smoker    Packs/day: 0.25    Years: 12.00    Pack years: 3.00    Types: Cigarettes    Last attempt to quit: 09/07/2004    Years since quitting: 13.7  . Smokeless tobacco: Never Used  Substance Use Topics  . Alcohol use: Yes    Comment: 4 times a  year  . Drug use: No    FAMILY HISTORY: Family History  Problem Relation Age of Onset  . Cancer Maternal Uncle   . Heart disease Father   . High Cholesterol Father   . Heart disease Paternal Grandfather   . Breast cancer Maternal Grandmother   . Rheum arthritis Mother   . Obesity Mother   . Rheum arthritis Maternal Grandfather   . Rheum arthritis Maternal Aunt     ROS: Review of Systems  Constitutional: Positive for weight loss.  Gastrointestinal: Negative for nausea and vomiting.  Musculoskeletal:       Negative for muscle weakness.  Psychiatric/Behavioral: The patient has insomnia.     PHYSICAL EXAM: Blood pressure 112/76, pulse 78, temperature 97.7 F (36.5 C), temperature source Oral, height 5\' 1"  (1.549 m), weight 270 lb (122.5 kg), SpO2 94 %. Body mass index is 51.02 kg/m. Physical Exam  Constitutional: She is oriented to person, place, and time. She appears well-developed and well-nourished.  Cardiovascular: Normal rate.  Pulmonary/Chest: Effort normal.  Musculoskeletal: Normal range of motion.  Neurological: She is oriented to person, place, and time.  Skin: Skin is warm and dry.  Psychiatric: She has a normal mood and affect. Her behavior is normal.  Vitals reviewed.   RECENT LABS AND TESTS: BMET    Component Value Date/Time   NA 140 04/27/2018 1144   K 4.3 04/27/2018 1144   CL 107 (H) 04/27/2018 1144   CO2 20 04/27/2018 1144   GLUCOSE 111 (H) 04/27/2018 1144   GLUCOSE 90 10/01/2017 1041   BUN 17 04/27/2018 1144   CREATININE 0.57 04/27/2018 1144   CREATININE 0.53 10/01/2017 1041   CALCIUM 8.8 04/27/2018 1144   GFRNONAA 114 04/27/2018 1144   GFRNONAA 116 10/01/2017 1041   GFRAA 131 04/27/2018 1144   GFRAA 135 10/01/2017 1041   Lab Results  Component Value Date   HGBA1C 5.8 (H) 04/27/2018   HGBA1C 5.8 (H) 12/07/2017   HGBA1C 6.0 (H) 10/01/2017   Lab Results  Component Value Date   INSULIN 46.1 (H) 04/27/2018   INSULIN 20.1 12/07/2017    CBC    Component Value Date/Time   WBC 5.6 04/27/2018 1144   WBC 7.0 10/01/2017 1041   RBC 4.85 04/27/2018 1144   RBC 5.02 10/01/2017 1041   HGB 13.4 04/27/2018 1144   HCT 42.9 04/27/2018 1144   PLT 244 10/01/2017 1041   MCV 89 04/27/2018 1144   MCH  27.6 04/27/2018 1144   MCH 28.3 10/01/2017 1041   MCHC 31.2 (L) 04/27/2018 1144   MCHC 34.5 10/01/2017 1041   RDW 14.1 04/27/2018 1144   LYMPHSABS 1.3 04/27/2018 1144   EOSABS 0.1 04/27/2018 1144   BASOSABS 0.0 04/27/2018 1144   Iron/TIBC/Ferritin/ %Sat No results found for: IRON, TIBC, FERRITIN, IRONPCTSAT Lipid Panel     Component Value Date/Time   CHOL 150 04/27/2018 1144   TRIG 142 04/27/2018 1144   HDL 36 (L) 04/27/2018 1144   CHOLHDL 2.9 10/08/2017 1103   LDLCALC 86 04/27/2018 1144   LDLCALC 69 10/08/2017 1103   Hepatic Function Panel     Component Value Date/Time   PROT 6.9 04/27/2018 1144   ALBUMIN 4.1 04/27/2018 1144   AST 22 04/27/2018 1144   ALT 35 (H) 04/27/2018 1144   ALKPHOS 75 04/27/2018 1144   BILITOT <0.2 04/27/2018 1144      Component Value Date/Time   TSH 0.377 (L) 04/27/2018 1144   TSH 1.890 02/15/2018 1137   TSH 0.009 (L) 12/07/2017 1204   Results for [name], [name] "[name]" (MRN [id number]) as of 06/14/2018 17:11  Ref. Range 04/27/2018 11:44  Vitamin D, 25-Hydroxy Latest Ref Range: 30.0 - 100.0 ng/mL 44.6   ASSESSMENT AND PLAN: Prediabetes  Vitamin D deficiency  Other insomnia  Class 3 severe obesity with serious comorbidity and body mass index (BMI) of 50.0 to 59.9 in adult, unspecified obesity type [facility])  PLAN:  Pre-Diabetes [name] will continue to work on weight loss, exercise, and decreasing simple carbohydrates in her diet to help decrease the risk of diabetes. She was informed that eating too many simple carbohydrates or too many calories at one sitting increases the likelihood of GI side effects. [name] agrees to decrease carbohydrates and continue metformin and a prescription  was not written today. [name] agreed to follow up with [place] as directed to monitor her progress in 2 weeks.  Vitamin D Deficiency [name] was informed that low vitamin D levels contributes to fatigue and are associated with obesity, breast, and colon cancer. She agrees to continue to take OTC Vit D daily and increase vitamin D rich foods. She will follow up for routine testing of vitamin D, at least 2-3 times per year. She was informed of the risk of over-replacement of vitamin D and agrees to not increase her dose unless she discusses this with [place] first. [name] agreed to follow up as directed.  Obesity [name] is currently in the action stage of change. As such, her goal is to continue with weight loss efforts. She has agreed to follow the Category 3 plan. She agrees to stop letting "1 thing to derail her" and to minimize calories by getting protein first. She is going to increase her water intake. She was given a Category 3 and a Halloween sheet. [name] has been instructed to work up to a goal of 150 minutes of combined cardio and strengthening exercise per week for weight loss and overall health benefits. We discussed the following Behavioral Modification Strategies today: increasing lean protein intake, decreasing simple carbohydrates, increase H2O, decrease eating out, no skipping meals, work on meal planning and easy cooking plans, and keeping healthy foods in the house.  [name] has agreed to follow up with our clinic in 2 weeks. She was informed of the importance of frequent follow up visits to maximize her success with intensive lifestyle modifications for her multiple health conditions.   OBESITY BEHAVIORAL INTERVENTION VISIT  Today'Mejia visit was #  11   Starting weight: 292 lbs Starting date: 12/07/17 Today'Mejia weight : Weight: 270 lb (122.5 kg)  Today'Mejia date: 06/13/2018 Total lbs lost to date: 32  ASK: We discussed the diagnosis of obesity with [name] today and [name] agreed to give [place]  permission to discuss obesity behavioral modification therapy today.  ASSESS: [name] has the diagnosis of obesity and her BMI today is 51.04. [name] is in the action stage of change.   ADVISE: [name] was educated on the multiple health risks of obesity as well as the benefit of weight loss to improve her health. She was advised of the need for long term treatment and the importance of lifestyle modifications to improve her current health and to decrease her risk of future health problems.  AGREE: Multiple dietary modification options and treatment options were discussed and [name] agreed to follow the recommendations documented in the above note.  ARRANGE: [name] was educated on the importance of frequent visits to treat obesity as outlined per CMS and USPSTF guidelines and agreed to schedule her next follow up appointment today.  I, Marcille Blanco, am acting as Location manager for [employer]. Owens Shark, DO  I have reviewed the above documentation for accuracy and completeness, and I agree with the above. -Jearld Lesch, DO

## 2018-06-15 ENCOUNTER — Encounter (INDEPENDENT_AMBULATORY_CARE_PROVIDER_SITE_OTHER): Payer: Self-pay | Admitting: Bariatrics

## 2018-06-15 ENCOUNTER — Ambulatory Visit: Payer: Medicaid Other | Admitting: Physical Therapy

## 2018-06-27 ENCOUNTER — Ambulatory Visit (INDEPENDENT_AMBULATORY_CARE_PROVIDER_SITE_OTHER): Payer: Medicaid Other | Admitting: Family Medicine

## 2018-06-27 VITALS — BP 124/83 | HR 77 | Temp 98.1°F | Ht 61.0 in | Wt 272.0 lb

## 2018-06-27 DIAGNOSIS — Z6841 Body Mass Index (BMI) 40.0 and over, adult: Secondary | ICD-10-CM | POA: Diagnosis not present

## 2018-06-27 DIAGNOSIS — E559 Vitamin D deficiency, unspecified: Secondary | ICD-10-CM

## 2018-06-30 NOTE — Progress Notes (Signed)
Office: [phone]  /  Fax: [phone]   HPI:   Chief Complaint: OBESITY [name] is here to discuss her progress with her obesity treatment plan. She is following the Category 3 plan and is following her eating plan approximately 30 % of the time. She states she is exercising 0 minutes 0 times per week. [name] was on vacation and had increased celebration eating. She is home now and ready to get back on track. She would like to go back to Category 3.  Her weight is 272 lb (123.4 kg) today and has not lost weight since her last visit. She has lost 22 lbs since starting treatment with [place].  Vitamin D deficiency [name] has a diagnosis of vitamin D deficiency. She is taking OTC Vit D but she is not yet at goal. [name] denies nausea, vomiting or muscle weakness.   ALLERGIES: Allergies  Allergen Reactions  . Erythromycin   . Zyrtec [Cetirizine Hcl]     Only allergic to Zyrtec D -     MEDICATIONS: Current Outpatient Medications on File Prior to Visit  Medication Sig Dispense Refill  . acetaminophen (TYLENOL) 325 MG tablet Take 650 mg by mouth every 6 (six) hours as needed.    [name] Kitchen albuterol (PROVENTIL) (2.5 MG/3ML) 0.083% nebulizer solution Take 3 mLs (2.5 mg total) by nebulization every 6 (six) hours as needed for wheezing or shortness of breath. 150 mL 1  . ARMOUR THYROID 120 MG tablet TAKE 1 TABLET BY MOUTH ONCE DAILY BEFOREBREAKFAST 90 tablet 0  . Azelastine-Fluticasone 137-50 MCG/ACT SUSP Place 1 spray into the nose daily.    . citalopram (CELEXA) 20 MG tablet Take 20 mg by mouth daily.      . clonazePAM (KLONOPIN) 0.5 MG tablet Take 0.5 mg by mouth 3 (three) times daily as needed for anxiety.    . diclofenac (VOLTAREN) 75 MG EC tablet TAKE 1 TABLET BY MOUTH 2 TIMES DAILY AS NEEDED 90 tablet 0  . fexofenadine (ALLEGRA) 180 MG tablet Take 1 tablet (180 mg total) by mouth daily. 90 tablet 1  . gabapentin (NEURONTIN) 100 MG capsule Take 100 mg by mouth 3 (three) times daily.    [name] Kitchen  ibuprofen (ADVIL,MOTRIN) 200 MG tablet Take 200 mg by mouth every 6 (six) hours as needed.    . metFORMIN (GLUCOPHAGE) 500 MG tablet Take 1 tablet (500 mg total) by mouth 2 (two) times daily with a meal. 60 tablet 0  . montelukast (SINGULAIR) 10 MG tablet Take 1 tablet (10 mg total) by mouth at bedtime. 30 tablet 3  . omeprazole (PRILOSEC) 20 MG capsule Take 20 mg by mouth daily.    [name] Kitchen VITAMIN D, CHOLECALCIFEROL, PO Take 50 mcg by mouth 2 (two) times daily.    [name] Kitchen zolpidem (AMBIEN) 5 MG tablet Take 10 mg by mouth at bedtime as needed.      No current facility-administered medications on file prior to visit.     PAST MEDICAL HISTORY: Past Medical History:  Diagnosis Date  . Asthma   . Chronic fatigue   . Chronic pain    back and hips  . Fibromyalgia   . High cholesterol   . Insomnia   . Prediabetes   . Scoliosis   . Sleep apnea     PAST SURGICAL HISTORY: Past Surgical History:  Procedure Laterality Date  . ABDOMINAL HYSTERECTOMY    . CESAREAN SECTION    . CHOLECYSTECTOMY    . SPINAL FUSION    . THYROIDECTOMY    .  TONSILLECTOMY    . TUBAL LIGATION      SOCIAL HISTORY: Social History   Tobacco Use  . Smoking status: Former Smoker    Packs/day: 0.25    Years: 12.00    Pack years: 3.00    Types: Cigarettes    Last attempt to quit: 09/07/2004    Years since quitting: 13.8  . Smokeless tobacco: Never Used  Substance Use Topics  . Alcohol use: Yes    Comment: 4 times a year  . Drug use: No    FAMILY HISTORY: Family History  Problem Relation Age of Onset  . Cancer Maternal Uncle   . Heart disease Father   . High Cholesterol Father   . Heart disease Paternal Grandfather   . Breast cancer Maternal Grandmother   . Rheum arthritis Mother   . Obesity Mother   . Rheum arthritis Maternal Grandfather   . Rheum arthritis Maternal Aunt     ROS: Review of Systems  Constitutional: Negative for weight loss.  Gastrointestinal: Negative for nausea and vomiting.    Musculoskeletal:       Negative muscle weakness    PHYSICAL EXAM: Blood pressure 124/83, pulse 77, temperature 98.1 F (36.7 C), temperature source Oral, height 5\' 1"  (1.549 m), weight 272 lb (123.4 kg), SpO2 97 %. Body mass index is 51.39 kg/m. Physical Exam  Constitutional: She is oriented to person, place, and time. She appears well-developed and well-nourished.  Cardiovascular: Normal rate.  Pulmonary/Chest: Effort normal.  Musculoskeletal: Normal range of motion.  Neurological: She is oriented to person, place, and time.  Skin: Skin is warm and dry.  Psychiatric: She has a normal mood and affect. Her behavior is normal.  Vitals reviewed.   RECENT LABS AND TESTS: BMET    Component Value Date/Time   NA 140 04/27/2018 1144   K 4.3 04/27/2018 1144   CL 107 (H) 04/27/2018 1144   CO2 20 04/27/2018 1144   GLUCOSE 111 (H) 04/27/2018 1144   GLUCOSE 90 10/01/2017 1041   BUN 17 04/27/2018 1144   CREATININE 0.57 04/27/2018 1144   CREATININE 0.53 10/01/2017 1041   CALCIUM 8.8 04/27/2018 1144   GFRNONAA 114 04/27/2018 1144   GFRNONAA 116 10/01/2017 1041   GFRAA 131 04/27/2018 1144   GFRAA 135 10/01/2017 1041   Lab Results  Component Value Date   HGBA1C 5.8 (H) 04/27/2018   HGBA1C 5.8 (H) 12/07/2017   HGBA1C 6.0 (H) 10/01/2017   Lab Results  Component Value Date   INSULIN 46.1 (H) 04/27/2018   INSULIN 20.1 12/07/2017   CBC    Component Value Date/Time   WBC 5.6 04/27/2018 1144   WBC 7.0 10/01/2017 1041   RBC 4.85 04/27/2018 1144   RBC 5.02 10/01/2017 1041   HGB 13.4 04/27/2018 1144   HCT 42.9 04/27/2018 1144   PLT 244 10/01/2017 1041   MCV 89 04/27/2018 1144   MCH 27.6 04/27/2018 1144   MCH 28.3 10/01/2017 1041   MCHC 31.2 (L) 04/27/2018 1144   MCHC 34.5 10/01/2017 1041   RDW 14.1 04/27/2018 1144   LYMPHSABS 1.3 04/27/2018 1144   EOSABS 0.1 04/27/2018 1144   BASOSABS 0.0 04/27/2018 1144   Iron/TIBC/Ferritin/ %Sat No results found for: IRON, TIBC,  FERRITIN, IRONPCTSAT Lipid Panel     Component Value Date/Time   CHOL 150 04/27/2018 1144   TRIG 142 04/27/2018 1144   HDL 36 (L) 04/27/2018 1144   CHOLHDL 2.9 10/08/2017 1103   LDLCALC 86 04/27/2018 1144   LDLCALC  69 10/08/2017 1103   Hepatic Function Panel     Component Value Date/Time   PROT 6.9 04/27/2018 1144   ALBUMIN 4.1 04/27/2018 1144   AST 22 04/27/2018 1144   ALT 35 (H) 04/27/2018 1144   ALKPHOS 75 04/27/2018 1144   BILITOT <0.2 04/27/2018 1144      Component Value Date/Time   TSH 0.377 (L) 04/27/2018 1144   TSH 1.890 02/15/2018 1137   TSH 0.009 (L) 12/07/2017 1204  Results for [name], [name] "[name]" (MRN [id number]) as of 06/30/2018 10:16  Ref. Range 04/27/2018 11:44  Vitamin D, 25-Hydroxy Latest Ref Range: 30.0 - 100.0 ng/mL 44.6    ASSESSMENT AND PLAN: Vitamin D deficiency  Class 3 severe obesity with serious comorbidity and body mass index (BMI) of 50.0 to 59.9 in adult, unspecified obesity type ([place])  PLAN:  Vitamin D Deficiency Eleen was informed that low vitamin D levels contributes to fatigue and are associated with obesity, breast, and colon cancer. She agrees to continue taking OTC Vit D.  Tomoko will follow up for routine testing of Vitamin D in 1 month. She was informed of the risk of over-replacement of vitamin D and agrees to not increase her dose unless she discusses this with [place] first.  Obesity Teresa is not currently in the action stage of change. As such, her goal is to continue with weight loss efforts She has agreed to follow the Category 3 plan Alianys has been instructed to work up to a goal of 150 minutes of combined cardio and strengthening exercise per week for weight loss and overall health benefits. We discussed the following Behavioral Modification Stratagies today: increasing lean protein intake and decreasing simple carbohydrates    Ericca has agreed to follow up with our clinic in 3 weeks. She was informed of the importance of  frequent follow up visits to maximize her success with intensive lifestyle modifications for her multiple health conditions.  I spent > than 50% of the 15 minute visit on counseling as documented in the note.    OBESITY BEHAVIORAL INTERVENTION VISIT  Today'Mejia visit was # 12  Starting weight: 292 lbs Starting date: 12/07/2017 Today'Mejia weight : 272 lbs  Today'Mejia date: 06/30/2018 Total lbs lost to date: 22 lbs At least 15 minutes were spent on discussing the following behavioral intervention visit.   ASK: We discussed the diagnosis of obesity with [name] today and Eusebia agreed to give [place] permission to discuss obesity behavioral modification therapy today.  ASSESS: Myldred has the diagnosis of obesity and her BMI today is 51.42 Mry is not in the action stage of change   ADVISE: Dashayla was educated on the multiple health risks of obesity as well as the benefit of weight loss to improve her health. She was advised of the need for long term treatment and the importance of lifestyle modifications to improve her current health and to decrease her risk of future health problems.  AGREE: Multiple dietary modification options and treatment options were discussed and  Zyah agreed to follow the recommendations documented in the above note.  ARRANGE: Tkai was educated on the importance of frequent visits to treat obesity as outlined per CMS and USPSTF guidelines and agreed to schedule her next follow up appointment today.  Felipa Emory, am acting as transcriptionist for Dennard Nip, MD  I have reviewed the above documentation for accuracy and completeness, and I agree with the above. -Dennard Nip, MD

## 2018-07-01 ENCOUNTER — Encounter: Payer: Self-pay | Admitting: Nurse Practitioner

## 2018-07-01 ENCOUNTER — Ambulatory Visit: Payer: Medicaid Other | Admitting: Nurse Practitioner

## 2018-07-01 VITALS — BP 110/80 | HR 90 | Temp 98.3°F | Resp 16 | Ht 61.0 in | Wt 272.0 lb

## 2018-07-01 DIAGNOSIS — Z23 Encounter for immunization: Secondary | ICD-10-CM

## 2018-07-01 DIAGNOSIS — R42 Dizziness and giddiness: Secondary | ICD-10-CM | POA: Diagnosis not present

## 2018-07-01 MED ORDER — MECLIZINE HCL 25 MG PO TABS: 50.0000 mg | ORAL_TABLET | Freq: Two times a day (BID) | ORAL | 0 refills | Status: DC

## 2018-07-01 MED ORDER — ONDANSETRON HCL 4 MG PO TABS: 4.0000 mg | ORAL_TABLET | Freq: Three times a day (TID) | ORAL | 0 refills | Status: DC | PRN

## 2018-07-01 NOTE — Patient Instructions (Signed)

## 2018-07-01 NOTE — Progress Notes (Signed)
Name: [name]   MRN: [id number]    DOB: Jan 09, 1976   Date:07/01/2018       Progress Note  Subjective  Chief Complaint  Chief Complaint  Patient presents with  . Dizziness    x 1 week not improving    HPI  States Sunday night was watching TV and felt woozy and lightheaded would last a couple of minutes fore a few hours; states was very mild at this time. Monday she bent over and felt very dizzy almost fell and everything felt like it was moving; went down to get toilet paper lately had it again for a few seconds. When drying hair happened again and has nausea associated with it. States sudden movements cause the dizziness to get worse. Took dramamine once some relief; tried meclizine as well with mild relief.   Denies headaches, palpitations, chest pain, weakness, hearing loss, speech changes.   Patient Active Problem List   Diagnosis Date Noted  . Mild intermittent asthma with acute exacerbation 05/13/2018  . Allergic rhinitis 03/09/2018  . Tension headache 03/09/2018  . Other fatigue 12/07/2017  . Shortness of breath on exertion 12/07/2017  . Other specified hypothyroidism 12/07/2017  . Prediabetes 10/15/2017  . Gastroesophageal reflux disease without esophagitis 10/15/2017  . Nasal valve collapse 10/15/2017  . 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/01/2017  . Chronic midline back pain 10/01/2017  . Vitamin D deficiency 10/01/2017  . Anxiety 10/01/2017  . PCOS (polycystic ovarian syndrome) 10/01/2017  . Severe episode of recurrent major depressive disorder, without psychotic features ([place]) 10/01/2017  . BMI 50.0-59.9, adult ([place]) 10/01/2017  . Fibromyalgia 10/01/2017  . Postoperative hypothyroidism 10/01/2017  . OSA (obstructive sleep apnea) 02/26/2014    Past Medical History:  Diagnosis Date  . Asthma   . Chronic fatigue   . Chronic pain    back and hips  . Fibromyalgia   . High cholesterol   . Insomnia   . Prediabetes   . Scoliosis   . Sleep apnea      Past Surgical History:  Procedure Laterality Date  . ABDOMINAL HYSTERECTOMY    . CESAREAN SECTION    . CHOLECYSTECTOMY    . SPINAL FUSION    . THYROIDECTOMY    . TONSILLECTOMY    . TUBAL LIGATION      Social History   Tobacco Use  . Smoking status: Former Smoker    Packs/day: 0.25    Years: 12.00    Pack years: 3.00    Types: Cigarettes    Last attempt to quit: 09/07/2004    Years since quitting: 13.8  . Smokeless tobacco: Never Used  Substance Use Topics  . Alcohol use: Yes    Comment: 4 times a year     Current Outpatient Medications:  .  acetaminophen (TYLENOL) 325 MG tablet, Take 650 mg by mouth every 6 (six) hours as needed., Disp: , Rfl:  .  albuterol (PROVENTIL) (2.5 MG/3ML) 0.083% nebulizer solution, Take 3 mLs (2.5 mg total) by nebulization every 6 (six) hours as needed for wheezing or shortness of breath., Disp: 150 mL, Rfl: 1 .  ARMOUR THYROID 120 MG tablet, TAKE 1 TABLET BY MOUTH ONCE DAILY BEFOREBREAKFAST, Disp: 90 tablet, Rfl: 0 .  Azelastine-Fluticasone 137-50 MCG/ACT SUSP, Place 1 spray into the nose daily., Disp: , Rfl:  .  citalopram (CELEXA) 20 MG tablet, Take 20 mg by mouth daily.  , Disp: , Rfl:  .  clonazePAM (KLONOPIN) 0.5 MG tablet, Take 0.5 mg by mouth  3 (three) times daily as needed for anxiety., Disp: , Rfl:  .  diclofenac (VOLTAREN) 75 MG EC tablet, TAKE 1 TABLET BY MOUTH 2 TIMES DAILY AS NEEDED, Disp: 90 tablet, Rfl: 0 .  fexofenadine (ALLEGRA) 180 MG tablet, Take 1 tablet (180 mg total) by mouth daily., Disp: 90 tablet, Rfl: 1 .  gabapentin (NEURONTIN) 100 MG capsule, Take 100 mg by mouth 3 (three) times daily., Disp: , Rfl:  .  ibuprofen (ADVIL,MOTRIN) 200 MG tablet, Take 200 mg by mouth every 6 (six) hours as needed., Disp: , Rfl:  .  metFORMIN (GLUCOPHAGE) 500 MG tablet, Take 1 tablet (500 mg total) by mouth 2 (two) times daily with a meal., Disp: 60 tablet, Rfl: 0 .  montelukast (SINGULAIR) 10 MG tablet, Take 1 tablet (10 mg total) by  mouth at bedtime., Disp: 30 tablet, Rfl: 3 .  omeprazole (PRILOSEC) 20 MG capsule, Take 20 mg by mouth daily., Disp: , Rfl:  .  VITAMIN D, CHOLECALCIFEROL, PO, Take 50 mcg by mouth 2 (two) times daily., Disp: , Rfl:  .  zolpidem (AMBIEN) 5 MG tablet, Take 10 mg by mouth at bedtime as needed. , Disp: , Rfl:  .  meclizine (ANTIVERT) 25 MG tablet, Take 2 tablets (50 mg total) by mouth 2 (two) times daily., Disp: 30 tablet, Rfl: 0 .  ondansetron (ZOFRAN) 4 MG tablet, Take 1 tablet (4 mg total) by mouth every 8 (eight) hours as needed for nausea or vomiting., Disp: 30 tablet, Rfl: 0  Allergies  Allergen Reactions  . Erythromycin   . Zyrtec [Cetirizine Hcl]     Only allergic to Zyrtec D -     ROS   No other specific complaints in a complete review of systems (except as listed in HPI above).  Objective  Vitals:   07/01/18 1521 07/01/18 1623 07/01/18 1624 07/01/18 1628  BP: 104/80 94/70 (!) 130/100 110/80  Pulse: 100 88 60 90  Resp: 16     Temp: 98.3 F (36.8 C)     TempSrc: Oral     SpO2: 97%     Weight: 272 lb (123.4 kg)     Height: 5\' 1"  (1.549 m)       Body mass index is 51.39 kg/m.  Nursing Note and Vital Signs reviewed.  Physical Exam  Constitutional: She is oriented to person, place, and time. She appears well-developed and well-nourished. She is cooperative.  HENT:  Head: Normocephalic and atraumatic.  Right Ear: Hearing, external ear and ear canal normal. Tympanic membrane is not injected and not scarred. A middle ear effusion is present.  Left Ear: Hearing, external ear and ear canal normal. Tympanic membrane is not injected and not scarred. A middle ear effusion is present.  Nose: Right sinus exhibits no maxillary sinus tenderness and no frontal sinus tenderness. Left sinus exhibits no maxillary sinus tenderness and no frontal sinus tenderness.  Mouth/Throat: Mucous membranes are normal.  Eyes: Conjunctivae are normal.  Cardiovascular: Normal rate, regular rhythm  and normal heart sounds.  Pulmonary/Chest: Effort normal and breath sounds normal.  Abdominal: Normal appearance.  Musculoskeletal: Normal range of motion.  Neurological: She is alert and oriented to person, place, and time. She has normal strength. No cranial nerve deficit or sensory deficit. GCS eye subscore is 4. GCS verbal subscore is 5. GCS motor subscore is 6.  No limb ataxia, steady gait   Psychiatric: She has a normal mood and affect. Her speech is normal and behavior is normal. Judgment and  thought content normal.       No results found for this or any previous visit (from the past 48 hour(s)).  Assessment & Plan  1. Vertigo - meclizine (ANTIVERT) 25 MG tablet; Take 2 tablets (50 mg total) by mouth 2 (two) times daily.  Dispense: 30 tablet; Refill: 0 - ondansetron (ZOFRAN) 4 MG tablet; Take 1 tablet (4 mg total) by mouth every 8 (eight) hours as needed for nausea or vomiting.  Dispense: 30 tablet; Refill: 0 - Ambulatory referral to Physical Therapy  2. Dizziness Discussed red flag signs to present to ER  - Orthostatic vital signs: negative  - meclizine (ANTIVERT) 25 MG tablet; Take 2 tablets (50 mg total) by mouth 2 (two) times daily.  Dispense: 30 tablet; Refill: 0 - ondansetron (ZOFRAN) 4 MG tablet; Take 1 tablet (4 mg total) by mouth every 8 (eight) hours as needed for nausea or vomiting.  Dispense: 30 tablet; Refill: 0 - Ambulatory referral to Physical Therapy  3. Flu vaccine need - Flu Vaccine QUAD 36+ mos IM

## 2018-07-04 ENCOUNTER — Encounter (INDEPENDENT_AMBULATORY_CARE_PROVIDER_SITE_OTHER): Payer: Self-pay | Admitting: Family Medicine

## 2018-07-04 ENCOUNTER — Ambulatory Visit: Payer: Medicaid Other | Admitting: Family Medicine

## 2018-07-12 ENCOUNTER — Ambulatory Visit (INDEPENDENT_AMBULATORY_CARE_PROVIDER_SITE_OTHER): Payer: Medicaid Other | Admitting: Family Medicine

## 2018-07-12 VITALS — BP 113/73 | HR 72 | Temp 97.6°F | Ht 61.0 in | Wt 266.0 lb

## 2018-07-12 DIAGNOSIS — R7303 Prediabetes: Secondary | ICD-10-CM

## 2018-07-12 DIAGNOSIS — Z6841 Body Mass Index (BMI) 40.0 and over, adult: Secondary | ICD-10-CM

## 2018-07-12 MED ORDER — METFORMIN HCL 500 MG PO TABS: 500.0000 mg | ORAL_TABLET | Freq: Two times a day (BID) | ORAL | 0 refills | Status: DC

## 2018-07-13 ENCOUNTER — Encounter (INDEPENDENT_AMBULATORY_CARE_PROVIDER_SITE_OTHER): Payer: Self-pay | Admitting: Family Medicine

## 2018-07-13 NOTE — Progress Notes (Signed)
Office: [phone]  /  Fax: [phone]   HPI:   Chief Complaint: OBESITY [name] is here to discuss her progress with her obesity treatment plan. She is on the Category 3 plan and is following her eating plan approximately 85-90 % of the time. She states she is exercising 0 minutes 0 times per week. [name] has done better with getting back on track and going back to her Category 3 plan. She is getting bored with dinner and would like to have more options.  Her weight is 266 lb (120.7 kg) today and has had a weight loss of 6 pounds over a period of 2 weeks since her last visit. She has lost 26 lbs since starting treatment with [place].  Pre-Diabetes [name] has a diagnosis of pre-diabetes based on her elevated Hgb A1c and was informed this puts her at greater risk of developing diabetes. She is stable on metformin and denies nausea, vomiting, or hypoglycemia. She is doing well on diet prescription and weight loss to decrease risk of diabetes.   ALLERGIES: Allergies  Allergen Reactions  . Erythromycin   . Zyrtec [Cetirizine Hcl]     Only allergic to Zyrtec D -     MEDICATIONS: Current Outpatient Medications on File Prior to Visit  Medication Sig Dispense Refill  . acetaminophen (TYLENOL) 325 MG tablet Take 650 mg by mouth every 6 (six) hours as needed.    [name] Kitchen albuterol (PROVENTIL) (2.5 MG/3ML) 0.083% nebulizer solution Take 3 mLs (2.5 mg total) by nebulization every 6 (six) hours as needed for wheezing or shortness of breath. 150 mL 1  . ARMOUR THYROID 120 MG tablet TAKE 1 TABLET BY MOUTH ONCE DAILY BEFOREBREAKFAST 90 tablet 0  . Azelastine-Fluticasone 137-50 MCG/ACT SUSP Place 1 spray into the nose daily.    . citalopram (CELEXA) 20 MG tablet Take 20 mg by mouth daily.      . clonazePAM (KLONOPIN) 0.5 MG tablet Take 0.5 mg by mouth 3 (three) times daily as needed for anxiety.    . diclofenac (VOLTAREN) 75 MG EC tablet TAKE 1 TABLET BY MOUTH 2 TIMES DAILY AS NEEDED 90 tablet 0  .  fexofenadine (ALLEGRA) 180 MG tablet Take 1 tablet (180 mg total) by mouth daily. 90 tablet 1  . gabapentin (NEURONTIN) 100 MG capsule Take 100 mg by mouth 3 (three) times daily.    [name] Kitchen ibuprofen (ADVIL,MOTRIN) 200 MG tablet Take 200 mg by mouth every 6 (six) hours as needed.    . meclizine (ANTIVERT) 25 MG tablet Take 2 tablets (50 mg total) by mouth 2 (two) times daily. 30 tablet 0  . montelukast (SINGULAIR) 10 MG tablet Take 1 tablet (10 mg total) by mouth at bedtime. 30 tablet 3  . omeprazole (PRILOSEC) 20 MG capsule Take 20 mg by mouth daily.    . ondansetron (ZOFRAN) 4 MG tablet Take 1 tablet (4 mg total) by mouth every 8 (eight) hours as needed for nausea or vomiting. 30 tablet 0  . VITAMIN D, CHOLECALCIFEROL, PO Take 50 mcg by mouth 2 (two) times daily.    [name] Kitchen zolpidem (AMBIEN) 5 MG tablet Take 10 mg by mouth at bedtime as needed.      No current facility-administered medications on file prior to visit.     PAST MEDICAL HISTORY: Past Medical History:  Diagnosis Date  . Asthma   . Chronic fatigue   . Chronic pain    back and hips  . Fibromyalgia   . High cholesterol   .  Insomnia   . Prediabetes   . Scoliosis   . Sleep apnea     PAST SURGICAL HISTORY: Past Surgical History:  Procedure Laterality Date  . ABDOMINAL HYSTERECTOMY    . CESAREAN SECTION    . CHOLECYSTECTOMY    . SPINAL FUSION    . THYROIDECTOMY    . TONSILLECTOMY    . TUBAL LIGATION      SOCIAL HISTORY: Social History   Tobacco Use  . Smoking status: Former Smoker    Packs/day: 0.25    Years: 12.00    Pack years: 3.00    Types: Cigarettes    Last attempt to quit: 09/07/2004    Years since quitting: 13.8  . Smokeless tobacco: Never Used  Substance Use Topics  . Alcohol use: Yes    Comment: 4 times a year  . Drug use: No    FAMILY HISTORY: Family History  Problem Relation Age of Onset  . Cancer Maternal Uncle   . Heart disease Father   . High Cholesterol Father   . Heart disease Paternal  Grandfather   . Breast cancer Maternal Grandmother   . Rheum arthritis Mother   . Obesity Mother   . Rheum arthritis Maternal Grandfather   . Rheum arthritis Maternal Aunt     ROS: Review of Systems  Constitutional: Positive for weight loss.  Gastrointestinal: Negative for nausea and vomiting.  Endo/Heme/Allergies:       Negative hypoglycemia    PHYSICAL EXAM: Blood pressure 113/73, pulse 72, temperature 97.6 F (36.4 C), temperature source Oral, height 5\' 1"  (1.549 m), weight 266 lb (120.7 kg), SpO2 96 %. Body mass index is 50.26 kg/m. Physical Exam  Constitutional: She is oriented to person, place, and time. She appears well-developed and well-nourished.  Cardiovascular: Normal rate.  Pulmonary/Chest: Effort normal.  Musculoskeletal: Normal range of motion.  Neurological: She is oriented to person, place, and time.  Skin: Skin is warm and dry.  Psychiatric: She has a normal mood and affect. Her behavior is normal.  Vitals reviewed.   RECENT LABS AND TESTS: BMET    Component Value Date/Time   NA 140 04/27/2018 1144   K 4.3 04/27/2018 1144   CL 107 (H) 04/27/2018 1144   CO2 20 04/27/2018 1144   GLUCOSE 111 (H) 04/27/2018 1144   GLUCOSE 90 10/01/2017 1041   BUN 17 04/27/2018 1144   CREATININE 0.57 04/27/2018 1144   CREATININE 0.53 10/01/2017 1041   CALCIUM 8.8 04/27/2018 1144   GFRNONAA 114 04/27/2018 1144   GFRNONAA 116 10/01/2017 1041   GFRAA 131 04/27/2018 1144   GFRAA 135 10/01/2017 1041   Lab Results  Component Value Date   HGBA1C 5.8 (H) 04/27/2018   HGBA1C 5.8 (H) 12/07/2017   HGBA1C 6.0 (H) 10/01/2017   Lab Results  Component Value Date   INSULIN 46.1 (H) 04/27/2018   INSULIN 20.1 12/07/2017   CBC    Component Value Date/Time   WBC 5.6 04/27/2018 1144   WBC 7.0 10/01/2017 1041   RBC 4.85 04/27/2018 1144   RBC 5.02 10/01/2017 1041   HGB 13.4 04/27/2018 1144   HCT 42.9 04/27/2018 1144   PLT 244 10/01/2017 1041   MCV 89 04/27/2018 1144    MCH 27.6 04/27/2018 1144   MCH 28.3 10/01/2017 1041   MCHC 31.2 (L) 04/27/2018 1144   MCHC 34.5 10/01/2017 1041   RDW 14.1 04/27/2018 1144   LYMPHSABS 1.3 04/27/2018 1144   EOSABS 0.1 04/27/2018 1144   BASOSABS 0.0 04/27/2018  1144   Iron/TIBC/Ferritin/ %Sat No results found for: IRON, TIBC, FERRITIN, IRONPCTSAT Lipid Panel     Component Value Date/Time   CHOL 150 04/27/2018 1144   TRIG 142 04/27/2018 1144   HDL 36 (L) 04/27/2018 1144   CHOLHDL 2.9 10/08/2017 1103   LDLCALC 86 04/27/2018 1144   LDLCALC 69 10/08/2017 1103   Hepatic Function Panel     Component Value Date/Time   PROT 6.9 04/27/2018 1144   ALBUMIN 4.1 04/27/2018 1144   AST 22 04/27/2018 1144   ALT 35 (H) 04/27/2018 1144   ALKPHOS 75 04/27/2018 1144   BILITOT <0.2 04/27/2018 1144      Component Value Date/Time   TSH 0.377 (L) 04/27/2018 1144   TSH 1.890 02/15/2018 1137   TSH 0.009 (L) 12/07/2017 1204    ASSESSMENT AND PLAN: Prediabetes - Plan: metFORMIN (GLUCOPHAGE) 500 MG tablet  Class 3 severe obesity with serious comorbidity and body mass index (BMI) of 50.0 to 59.9 in adult, unspecified obesity type ([place])  PLAN:  Pre-Diabetes [name] will continue to work on weight loss, diet, exercise, and decreasing simple carbohydrates in her diet to help decrease the risk of diabetes. We dicussed metformin including benefits and risks. She was informed that eating too many simple carbohydrates or too many calories at one sitting increases the likelihood of GI side effects. [name] agrees to continue taking metformin 500 mg BID #60 and we will refill for 1 month. We will recheck labs in 1 month. [name] agrees to follow up with our clinic in 2 weeks as directed to monitor her progress.  Obesity [name] is currently in the action stage of change. As such, her goal is to continue with weight loss efforts She has agreed to keep a food journal with 400-600 calories and 40 grams of protein at supper daily and follow the  Category 3 plan [name] has been instructed to work up to a goal of 150 minutes of combined cardio and strengthening exercise per week for weight loss and overall health benefits. We discussed the following Behavioral Modification Strategies today: increasing lean protein intake, decreasing simple carbohydrates  and work on meal planning and easy cooking plans   [name] has agreed to follow up with our clinic in 2 weeks. She was informed of the importance of frequent follow up visits to maximize her success with intensive lifestyle modifications for her multiple health conditions.   OBESITY BEHAVIORAL INTERVENTION VISIT  Today's visit was # 13   Starting weight: 292 lbs Starting date: 12/07/17 Today's weight : 266 lbs  Today's date: 07/12/2018 Total lbs lost to date: 69    ASK: We discussed the diagnosis of obesity with [name] today and [name] agreed to give [place] permission to discuss obesity behavioral modification therapy today.  ASSESS: [name] has the diagnosis of obesity and her BMI today is 50.29 [name] is in the action stage of change   ADVISE: [name] was educated on the multiple health risks of obesity as well as the benefit of weight loss to improve her health. She was advised of the need for long term treatment and the importance of lifestyle modifications to improve her current health and to decrease her risk of future health problems.  AGREE: Multiple dietary modification options and treatment options were discussed and  [name] agreed to follow the recommendations documented in the above note.  ARRANGE: [name] was educated on the importance of frequent visits to treat obesity as outlined per CMS and USPSTF guidelines and agreed  to schedule her next follow up appointment today.  I, Trixie Dredge, am acting as transcriptionist for Dennard Nip, MD  I have reviewed the above documentation for accuracy and completeness, and I agree with the above. -Dennard Nip, MD

## 2018-07-25 ENCOUNTER — Ambulatory Visit (INDEPENDENT_AMBULATORY_CARE_PROVIDER_SITE_OTHER): Payer: Medicaid Other | Admitting: Physician Assistant

## 2018-07-25 ENCOUNTER — Encounter (INDEPENDENT_AMBULATORY_CARE_PROVIDER_SITE_OTHER): Payer: Self-pay | Admitting: Physician Assistant

## 2018-07-25 VITALS — BP 115/78 | HR 77 | Temp 98.1°F | Ht 61.0 in | Wt 265.0 lb

## 2018-07-25 DIAGNOSIS — R7303 Prediabetes: Secondary | ICD-10-CM | POA: Diagnosis not present

## 2018-07-25 DIAGNOSIS — Z6841 Body Mass Index (BMI) 40.0 and over, adult: Secondary | ICD-10-CM | POA: Diagnosis not present

## 2018-07-28 NOTE — Progress Notes (Signed)
Office: [phone]  /  Fax: [phone]   HPI:   Chief Complaint: OBESITY [name] is here to discuss her progress with her obesity treatment plan. She is on the keep a food journal with 400 to 600 calories and 40 grams of protein at supper daily and the Category 3 plan and is following her eating plan approximately 50 % of the time. She states she is exercising 0 minutes 0 times per week. [name] did well with weight loss. [name] reports that she is bored with dinner and she needs more ideas. She is also asking about Thanksgiving strategies. Her weight is 265 lb (120.2 kg) today and has had a weight loss of 1 pound over a period of 2 weeks since her last visit. She has lost 27 lbs since starting treatment with [place].  [name] has a diagnosis of prediabetes based on her elevated Hgb A1c and was informed this puts her at greater risk of developing diabetes. She is taking metformin currently and she denies nausea, vomiting or diarrhea. [name] continues to work on diet and exercise to decrease risk of diabetes. She denies polyphagia or hypoglycemia.  ALLERGIES: Allergies  Allergen Reactions  . Erythromycin   . Zyrtec [Cetirizine Hcl]     Only allergic to Zyrtec D -     MEDICATIONS: Current Outpatient Medications on File Prior to Visit  Medication Sig Dispense Refill  . acetaminophen (TYLENOL) 325 MG tablet Take 650 mg by mouth every 6 (six) hours as needed.    [name] Kitchen albuterol (PROVENTIL) (2.5 MG/3ML) 0.083% nebulizer solution Take 3 mLs (2.5 mg total) by nebulization every 6 (six) hours as needed for wheezing or shortness of breath. 150 mL 1  . ARMOUR THYROID 120 MG tablet TAKE 1 TABLET BY MOUTH ONCE DAILY BEFOREBREAKFAST 90 tablet 0  . Azelastine-Fluticasone 137-50 MCG/ACT SUSP Place 1 spray into the nose daily.    . citalopram (CELEXA) 20 MG tablet Take 20 mg by mouth daily.      . clonazePAM (KLONOPIN) 0.5 MG tablet Take 0.5 mg by mouth 3 (three) times daily as needed for anxiety.      . diclofenac (VOLTAREN) 75 MG EC tablet TAKE 1 TABLET BY MOUTH 2 TIMES DAILY AS NEEDED 90 tablet 0  . fexofenadine (ALLEGRA) 180 MG tablet Take 1 tablet (180 mg total) by mouth daily. 90 tablet 1  . gabapentin (NEURONTIN) 100 MG capsule Take 100 mg by mouth 3 (three) times daily.    [name] Kitchen ibuprofen (ADVIL,MOTRIN) 200 MG tablet Take 200 mg by mouth every 6 (six) hours as needed.    . metFORMIN (GLUCOPHAGE) 500 MG tablet Take 1 tablet (500 mg total) by mouth 2 (two) times daily with a meal. 60 tablet 0  . montelukast (SINGULAIR) 10 MG tablet Take 1 tablet (10 mg total) by mouth at bedtime. 30 tablet 3  . omeprazole (PRILOSEC) 20 MG capsule Take 20 mg by mouth daily.    [name] Kitchen VITAMIN D, CHOLECALCIFEROL, PO Take 50 mcg by mouth 2 (two) times daily.    [name] Kitchen zolpidem (AMBIEN) 5 MG tablet Take 10 mg by mouth at bedtime as needed.      No current facility-administered medications on file prior to visit.     PAST MEDICAL HISTORY: Past Medical History:  Diagnosis Date  . Asthma   . Chronic fatigue   . Chronic pain    back and hips  . Fibromyalgia   . High cholesterol   . Insomnia   . Prediabetes   .  Scoliosis   . Sleep apnea     PAST SURGICAL HISTORY: Past Surgical History:  Procedure Laterality Date  . ABDOMINAL HYSTERECTOMY    . CESAREAN SECTION    . CHOLECYSTECTOMY    . SPINAL FUSION    . THYROIDECTOMY    . TONSILLECTOMY    . TUBAL LIGATION      SOCIAL HISTORY: Social History   Tobacco Use  . Smoking status: Former Smoker    Packs/day: 0.25    Years: 12.00    Pack years: 3.00    Types: Cigarettes    Last attempt to quit: 09/07/2004    Years since quitting: 13.8  . Smokeless tobacco: Never Used  Substance Use Topics  . Alcohol use: Yes    Comment: 4 times a year  . Drug use: No    FAMILY HISTORY: Family History  Problem Relation Age of Onset  . Cancer Maternal Uncle   . Heart disease Father   . High Cholesterol Father   . Heart disease Paternal Grandfather   . Breast  cancer Maternal Grandmother   . Rheum arthritis Mother   . Obesity Mother   . Rheum arthritis Maternal Grandfather   . Rheum arthritis Maternal Aunt     ROS: Review of Systems  Constitutional: Positive for weight loss.  Gastrointestinal: Negative for diarrhea, nausea and vomiting.  Endo/Heme/Allergies:       Negative for hypoglycemia Negative for polyphagia    PHYSICAL EXAM: Blood pressure 115/78, pulse 77, temperature 98.1 F (36.7 C), temperature source Oral, height 5\' 1"  (1.549 m), weight 265 lb (120.2 kg), SpO2 97 %. Body mass index is 50.07 kg/m. Physical Exam  Constitutional: She is oriented to person, place, and time. She appears well-developed and well-nourished.  Cardiovascular: Normal rate.  Pulmonary/Chest: Effort normal.  Musculoskeletal: Normal range of motion.  Neurological: She is oriented to person, place, and time.  Skin: Skin is warm and dry.  Psychiatric: She has a normal mood and affect. Her behavior is normal.  Vitals reviewed.   RECENT LABS AND TESTS: BMET    Component Value Date/Time   NA 140 04/27/2018 1144   K 4.3 04/27/2018 1144   CL 107 (H) 04/27/2018 1144   CO2 20 04/27/2018 1144   GLUCOSE 111 (H) 04/27/2018 1144   GLUCOSE 90 10/01/2017 1041   BUN 17 04/27/2018 1144   CREATININE 0.57 04/27/2018 1144   CREATININE 0.53 10/01/2017 1041   CALCIUM 8.8 04/27/2018 1144   GFRNONAA 114 04/27/2018 1144   GFRNONAA 116 10/01/2017 1041   GFRAA 131 04/27/2018 1144   GFRAA 135 10/01/2017 1041   Lab Results  Component Value Date   HGBA1C 5.8 (H) 04/27/2018   HGBA1C 5.8 (H) 12/07/2017   HGBA1C 6.0 (H) 10/01/2017   Lab Results  Component Value Date   INSULIN 46.1 (H) 04/27/2018   INSULIN 20.1 12/07/2017   CBC    Component Value Date/Time   WBC 5.6 04/27/2018 1144   WBC 7.0 10/01/2017 1041   RBC 4.85 04/27/2018 1144   RBC 5.02 10/01/2017 1041   HGB 13.4 04/27/2018 1144   HCT 42.9 04/27/2018 1144   PLT 244 10/01/2017 1041   MCV 89  04/27/2018 1144   MCH 27.6 04/27/2018 1144   MCH 28.3 10/01/2017 1041   MCHC 31.2 (L) 04/27/2018 1144   MCHC 34.5 10/01/2017 1041   RDW 14.1 04/27/2018 1144   LYMPHSABS 1.3 04/27/2018 1144   EOSABS 0.1 04/27/2018 1144   BASOSABS 0.0 04/27/2018 1144  Iron/TIBC/Ferritin/ %Sat No results found for: IRON, TIBC, FERRITIN, IRONPCTSAT Lipid Panel     Component Value Date/Time   CHOL 150 04/27/2018 1144   TRIG 142 04/27/2018 1144   HDL 36 (L) 04/27/2018 1144   CHOLHDL 2.9 10/08/2017 1103   LDLCALC 86 04/27/2018 1144   LDLCALC 69 10/08/2017 1103   Hepatic Function Panel     Component Value Date/Time   PROT 6.9 04/27/2018 1144   ALBUMIN 4.1 04/27/2018 1144   AST 22 04/27/2018 1144   ALT 35 (H) 04/27/2018 1144   ALKPHOS 75 04/27/2018 1144   BILITOT <0.2 04/27/2018 1144      Component Value Date/Time   TSH 0.377 (L) 04/27/2018 1144   TSH 1.890 02/15/2018 1137   TSH 0.009 (L) 12/07/2017 1204   Results for Albanese, Marissa S "Zayana Stoker" (MRN [id number]) as of 07/28/2018 12:15  Ref. Range 04/27/2018 11:44  Vitamin D, 25-Hydroxy Latest Ref Range: 30.0 - 100.0 ng/mL 44.6   ASSESSMENT AND PLAN: Prediabetes  Class 3 severe obesity with serious comorbidity and body mass index (BMI) of 50.0 to 59.9 in adult, unspecified obesity type [facility])  PLAN:  Marissa [name] will continue to work on weight loss, exercise, and decreasing simple carbohydrates in her diet to help decrease the risk of diabetes. We dicussed metformin including benefits and risks. She was informed that eating too many simple carbohydrates or too many calories at one sitting increases the likelihood of GI side effects. [name] will continue metformin for now and a prescription was not written today. [name] agreed to follow up with [place] as directed to monitor her progress.  I spent > than 50% of the 15 minute visit on counseling as documented in the note.  Obesity [name] is currently in the action stage of change. As such, her  goal is to continue with weight loss efforts She has agreed to keep a food journal with 400 to 600 calories and 40 grams of protein at supper daily and follow the Category 3 plan [name] has been instructed to work up to a goal of 150 minutes of combined cardio and strengthening exercise per week for weight loss and overall health benefits. We discussed the following Behavioral Modification Strategies today: work on meal planning and easy cooking plans, holiday eating strategies  and ways to avoid boredom eating  [name] has agreed to follow up with our clinic in 2 to 3 weeks. She was informed of the importance of frequent follow up visits to maximize her success with intensive lifestyle modifications for her multiple health conditions.   OBESITY BEHAVIORAL INTERVENTION VISIT  Today's visit was # 14   Starting weight: 292 lbs Starting date: 12/07/2017 Today's weight : 265 lbs  Today's date: 07/25/2018 Total lbs lost to date: 25   ASK: We discussed the diagnosis of obesity with [name] today and [name] agreed to give [place] permission to discuss obesity behavioral modification therapy today.  ASSESS: [name] has the diagnosis of obesity and her BMI today is 50.1 [name] is in the action stage of change   ADVISE: Pinki was educated on the multiple health risks of obesity as well as the benefit of weight loss to improve her health. She was advised of the need for long term treatment and the importance of lifestyle modifications to improve her current health and to decrease her risk of future health problems.  AGREE: Multiple dietary modification options and treatment options were discussed and  [name] agreed to follow the recommendations documented in  the above note.  ARRANGE: [name] was educated on the importance of frequent visits to treat obesity as outlined per CMS and USPSTF guidelines and agreed to schedule her next follow up appointment today.  Corey Skains, am acting as  transcriptionist for Abby Potash, PA-C I, Abby Potash, PA-C have reviewed above note and agree with its content

## 2018-08-15 ENCOUNTER — Encounter (INDEPENDENT_AMBULATORY_CARE_PROVIDER_SITE_OTHER): Payer: Self-pay | Admitting: Family Medicine

## 2018-08-15 ENCOUNTER — Ambulatory Visit (INDEPENDENT_AMBULATORY_CARE_PROVIDER_SITE_OTHER): Payer: Medicaid Other | Admitting: Family Medicine

## 2018-08-15 VITALS — BP 117/80 | HR 87 | Ht 61.0 in | Wt 267.0 lb

## 2018-08-15 DIAGNOSIS — Z6841 Body Mass Index (BMI) 40.0 and over, adult: Secondary | ICD-10-CM

## 2018-08-15 DIAGNOSIS — R7303 Prediabetes: Secondary | ICD-10-CM

## 2018-08-16 NOTE — Progress Notes (Signed)
Office: [phone]  /  Fax: [phone]   HPI:   Chief Complaint: OBESITY [name] is here to discuss her progress with her obesity treatment plan. She is on the Category 3 plan and is following her eating plan approximately 50 to 60 % of the time. She states she is exercising 0 minutes 0 times per week. [name] is retaining fluid today and has had increased temptations and reduced meal planning and prepping over the last 3 weeks. She feels bloated and is ready to get back on track.  Her weight is 267 lb (121.1 kg) today and has had a weight gain of 2 pounds over a period of 3 weeks since her last visit. She has lost 25 lbs since starting treatment with [place].  Pre-Diabetes [name] has a diagnosis of pre-diabetes based on her elevated Hgb A1c and was informed this puts her at greater risk of developing diabetes. She is stable on metformin currently and struggling with increase simple carb temptations. She continues to work on diet and exercise to decrease risk of diabetes.  ALLERGIES: Allergies  Allergen Reactions  . Erythromycin   . Zyrtec [Cetirizine Hcl]     Only allergic to Zyrtec D -     MEDICATIONS: Current Outpatient Medications on File Prior to Visit  Medication Sig Dispense Refill  . acetaminophen (TYLENOL) 325 MG tablet Take 650 mg by mouth every 6 (six) hours as needed.    [name] Kitchen albuterol (PROVENTIL) (2.5 MG/3ML) 0.083% nebulizer solution Take 3 mLs (2.5 mg total) by nebulization every 6 (six) hours as needed for wheezing or shortness of breath. 150 mL 1  . ARMOUR THYROID 120 MG tablet TAKE 1 TABLET BY MOUTH ONCE DAILY BEFOREBREAKFAST 90 tablet 0  . Azelastine-Fluticasone 137-50 MCG/ACT SUSP Place 1 spray into the nose daily.    . citalopram (CELEXA) 20 MG tablet Take 20 mg by mouth daily.      . clonazePAM (KLONOPIN) 0.5 MG tablet Take 0.5 mg by mouth 3 (three) times daily as needed for anxiety.    . diclofenac (VOLTAREN) 75 MG EC tablet TAKE 1 TABLET BY MOUTH 2 TIMES DAILY AS  NEEDED 90 tablet 0  . fexofenadine (ALLEGRA) 180 MG tablet Take 1 tablet (180 mg total) by mouth daily. 90 tablet 1  . gabapentin (NEURONTIN) 100 MG capsule Take 100 mg by mouth 3 (three) times daily.    [name] Kitchen ibuprofen (ADVIL,MOTRIN) 200 MG tablet Take 200 mg by mouth every 6 (six) hours as needed.    . metFORMIN (GLUCOPHAGE) 500 MG tablet Take 1 tablet (500 mg total) by mouth 2 (two) times daily with a meal. 60 tablet 0  . montelukast (SINGULAIR) 10 MG tablet Take 1 tablet (10 mg total) by mouth at bedtime. 30 tablet 3  . omeprazole (PRILOSEC) 20 MG capsule Take 20 mg by mouth daily.    [name] Kitchen VITAMIN D, CHOLECALCIFEROL, PO Take 50 mcg by mouth 2 (two) times daily.    [name] Kitchen zolpidem (AMBIEN) 5 MG tablet Take 10 mg by mouth at bedtime as needed.      No current facility-administered medications on file prior to visit.     PAST MEDICAL HISTORY: Past Medical History:  Diagnosis Date  . Asthma   . Chronic fatigue   . Chronic pain    back and hips  . Fibromyalgia   . High cholesterol   . Insomnia   . Prediabetes   . Scoliosis   . Sleep apnea     PAST SURGICAL HISTORY: Past  Surgical History:  Procedure Laterality Date  . ABDOMINAL HYSTERECTOMY    . CESAREAN SECTION    . CHOLECYSTECTOMY    . SPINAL FUSION    . THYROIDECTOMY    . TONSILLECTOMY    . TUBAL LIGATION      SOCIAL HISTORY: Social History   Tobacco Use  . Smoking status: Former Smoker    Packs/day: 0.25    Years: 12.00    Pack years: 3.00    Types: Cigarettes    Last attempt to quit: 09/07/2004    Years since quitting: 13.9  . Smokeless tobacco: Never Used  Substance Use Topics  . Alcohol use: Yes    Comment: 4 times a year  . Drug use: No    FAMILY HISTORY: Family History  Problem Relation Age of Onset  . Cancer Maternal Uncle   . Heart disease Father   . High Cholesterol Father   . Heart disease Paternal Grandfather   . Breast cancer Maternal Grandmother   . Rheum arthritis Mother   . Obesity Mother   .  Rheum arthritis Maternal Grandfather   . Rheum arthritis Maternal Aunt     ROS: Review of Systems  Constitutional: Negative for weight loss.    PHYSICAL EXAM: Blood pressure 117/80, pulse 87, height 5\' 1"  (1.549 m), weight 267 lb (121.1 kg), SpO2 95 %. Body mass index is 50.45 kg/m. Physical Exam  Constitutional: She is oriented to person, place, and time. She appears well-developed and well-nourished.  Cardiovascular: Normal rate.  Pulmonary/Chest: Effort normal.  Musculoskeletal: Normal range of motion.  Neurological: She is oriented to person, place, and time.  Skin: Skin is warm and dry.  Psychiatric: She has a normal mood and affect. Her behavior is normal.  Vitals reviewed.   RECENT LABS AND TESTS: BMET    Component Value Date/Time   NA 140 04/27/2018 1144   K 4.3 04/27/2018 1144   CL 107 (H) 04/27/2018 1144   CO2 20 04/27/2018 1144   GLUCOSE 111 (H) 04/27/2018 1144   GLUCOSE 90 10/01/2017 1041   BUN 17 04/27/2018 1144   CREATININE 0.57 04/27/2018 1144   CREATININE 0.53 10/01/2017 1041   CALCIUM 8.8 04/27/2018 1144   GFRNONAA 114 04/27/2018 1144   GFRNONAA 116 10/01/2017 1041   GFRAA 131 04/27/2018 1144   GFRAA 135 10/01/2017 1041   Lab Results  Component Value Date   HGBA1C 5.8 (H) 04/27/2018   HGBA1C 5.8 (H) 12/07/2017   HGBA1C 6.0 (H) 10/01/2017   Lab Results  Component Value Date   INSULIN 46.1 (H) 04/27/2018   INSULIN 20.1 12/07/2017   CBC    Component Value Date/Time   WBC 5.6 04/27/2018 1144   WBC 7.0 10/01/2017 1041   RBC 4.85 04/27/2018 1144   RBC 5.02 10/01/2017 1041   HGB 13.4 04/27/2018 1144   HCT 42.9 04/27/2018 1144   PLT 244 10/01/2017 1041   MCV 89 04/27/2018 1144   MCH 27.6 04/27/2018 1144   MCH 28.3 10/01/2017 1041   MCHC 31.2 (L) 04/27/2018 1144   MCHC 34.5 10/01/2017 1041   RDW 14.1 04/27/2018 1144   LYMPHSABS 1.3 04/27/2018 1144   EOSABS 0.1 04/27/2018 1144   BASOSABS 0.0 04/27/2018 1144   Iron/TIBC/Ferritin/  %Sat No results found for: IRON, TIBC, FERRITIN, IRONPCTSAT Lipid Panel     Component Value Date/Time   CHOL 150 04/27/2018 1144   TRIG 142 04/27/2018 1144   HDL 36 (L) 04/27/2018 1144   CHOLHDL 2.9 10/08/2017 1103  LDLCALC 86 04/27/2018 1144   LDLCALC 69 10/08/2017 1103   Hepatic Function Panel     Component Value Date/Time   PROT 6.9 04/27/2018 1144   ALBUMIN 4.1 04/27/2018 1144   AST 22 04/27/2018 1144   ALT 35 (H) 04/27/2018 1144   ALKPHOS 75 04/27/2018 1144   BILITOT <0.2 04/27/2018 1144      Component Value Date/Time   TSH 0.377 (L) 04/27/2018 1144   TSH 1.890 02/15/2018 1137   TSH 0.009 (L) 12/07/2017 1204   Results for Tschantz, Marissa S "[name]" (MRN [id number]) as of 08/16/2018 14:36  Ref. Range 04/27/2018 11:44  Vitamin D, 25-Hydroxy Latest Ref Range: 30.0 - 100.0 ng/mL 44.6   ASSESSMENT AND PLAN: Prediabetes  Class 3 severe obesity with serious comorbidity and body mass index (BMI) of 50.0 to 59.9 in adult, unspecified obesity type [facility])  PLAN:  Pre-Diabetes [name] will continue to work on weight loss, exercise, and decreasing simple carbohydrates in her diet to help decrease the risk of diabetes.  She was informed that eating too many simple carbohydrates or too many calories at one sitting increases the likelihood of GI side effects. [name] agreed to continue his diet, exercise, and metformin and a prescription was not written today. [name] agreed to follow up with [place] as directed to monitor her progress in 2 weeks.  I spent > than 50% of the 15 minute visit on counseling as documented in the note.  Obesity [name] is currently in the action stage of change. As such, her goal is to continue with weight loss efforts. She has agreed to follow the Category 2 plan and to keep a food journal with 400 to 600 calories and 40 grams of protein for supper. [name] has been instructed to work up to a goal of 150 minutes of combined cardio and strengthening exercise per week  for weight loss and overall health benefits. We discussed the following Behavioral Modification Strategies today: increasing lean protein intake, increasing vegetables, work on meal planning and easy cooking plans, dealing with family or coworker sabotage, and holiday eating strategies.   [name] has agreed to follow up with our clinic in 2 weeks. She was informed of the importance of frequent follow up visits to maximize her success with intensive lifestyle modifications for her multiple health conditions.   OBESITY BEHAVIORAL INTERVENTION VISIT  Today's visit was # 15   Starting weight: 292 lbs Starting date: 267 lbs Today's weight : Weight: 267 lb (121.1 kg)  Today's date: 08/15/2018 Total lbs lost to date: 25  ASK: We discussed the diagnosis of obesity with [name] today and [name] agreed to give [place] permission to discuss obesity behavioral modification therapy today.  ASSESS: [name] has the diagnosis of obesity and her BMI today is 50.4. [name] is in the action stage of change.   ADVISE: [name] was educated on the multiple health risks of obesity as well as the benefit of weight loss to improve her health. She was advised of the need for long term treatment and the importance of lifestyle modifications to improve her current health and to decrease her risk of future health problems.  AGREE: Multiple dietary modification options and treatment options were discussed and [name] agreed to follow the recommendations documented in the above note.  ARRANGE: [name] was educated on the importance of frequent visits to treat obesity as outlined per CMS and USPSTF guidelines and agreed to schedule her next follow up appointment today.  IMarcille Blanco, am  acting as transcriptionist for Starlyn Skeans, MD  I have reviewed the above documentation for accuracy and completeness, and I agree with the above. -Dennard Nip, MD

## 2018-08-29 ENCOUNTER — Encounter (INDEPENDENT_AMBULATORY_CARE_PROVIDER_SITE_OTHER): Payer: Self-pay | Admitting: Family Medicine

## 2018-08-29 ENCOUNTER — Ambulatory Visit (INDEPENDENT_AMBULATORY_CARE_PROVIDER_SITE_OTHER): Payer: Medicaid Other | Admitting: Family Medicine

## 2018-08-29 VITALS — BP 108/76 | HR 89 | Temp 98.0°F | Ht 61.0 in | Wt 266.0 lb

## 2018-08-29 DIAGNOSIS — E7849 Other hyperlipidemia: Secondary | ICD-10-CM

## 2018-08-29 DIAGNOSIS — E559 Vitamin D deficiency, unspecified: Secondary | ICD-10-CM

## 2018-08-29 DIAGNOSIS — Z6841 Body Mass Index (BMI) 40.0 and over, adult: Secondary | ICD-10-CM

## 2018-08-29 DIAGNOSIS — R7303 Prediabetes: Secondary | ICD-10-CM | POA: Diagnosis not present

## 2018-08-29 DIAGNOSIS — E038 Other specified hypothyroidism: Secondary | ICD-10-CM | POA: Diagnosis not present

## 2018-08-29 MED ORDER — METFORMIN HCL 500 MG PO TABS: 500.0000 mg | ORAL_TABLET | Freq: Two times a day (BID) | ORAL | 0 refills | Status: DC

## 2018-08-29 NOTE — Progress Notes (Signed)
Office: [phone]  /  Fax: [phone]   HPI:   Chief Complaint: OBESITY [name] is here to discuss her progress with her obesity treatment plan. She is on the keep a food journal with 400-600 calories and 40 grams of protein at supper daily and follow the Category 3 plan and is following her eating plan approximately 50 % of the time. She states she is exercising 0 minutes 0 times per week. [name] continues to do well with diet and weight loss even with increased challenges and temptations. Her meal planning has decreased and she has been skipping meals.  Her weight is 266 lb (120.7 kg) today and has had a weight loss of 1 pound over a period of 2 weeks since her last visit. She has lost 26 lbs since starting treatment with [place].  Pre-Diabetes [name] has a diagnosis of pre-diabetes based on her elevated Hgb A1c and was informed this puts her at greater risk of developing diabetes. She is stable on metformin and denies nausea, vomiting, hypoglycemia. She continues to working on diet prescription and exercise to decrease risk of diabetes.   Vitamin D Deficiency [name] has a diagnosis of vitamin D deficiency. She is stable on OTC Vit D and she is due for labs. She denies nausea, vomiting or muscle weakness.  Hypothyroidism [name] has a diagnosis of hypothyroidism. She is on armour thyroid and she is due for labs. She denies hot or cold intolerance or palpitations.  Hyperlipidemia [name] has hyperlipidemia and she is working on trying to improve her cholesterol levels with intensive lifestyle modification including a low saturated fat diet, exercise and weight loss. She is not on statin and denies any chest pain, claudication or myalgias.  ASSESSMENT AND PLAN:  Prediabetes - Plan: metFORMIN (GLUCOPHAGE) 500 MG tablet  Vitamin D deficiency  Other specified hypothyroidism  Other hyperlipidemia  Class 3 severe obesity with serious comorbidity and body mass index (BMI) of 50.0 to 59.9 in  adult, unspecified obesity type [facility])  PLAN:  Pre-Diabetes [name] will continue to work on weight loss, exercise, and decreasing simple carbohydrates in her diet to help decrease the risk of diabetes. We dicussed metformin including benefits and risks. She was informed that eating too many simple carbohydrates or too many calories at one sitting increases the likelihood of GI side effects. [name] agrees to continue taking metformin 500 mg BID #60 and we will refill for 1 month. [name] agrees to follow up with our clinic in 2 to 3 weeks with Marissa Potash, PA-C as directed to monitor her progress.  Vitamin D Deficiency [name] was informed that low vitamin D levels contributes to fatigue and are associated with obesity, breast, and colon cancer. [name] agrees to continue taking OTC Vit D and will follow up for routine testing of vitamin D, at least 2-3 times per year. She was informed of the risk of over-replacement of vitamin D and agrees to not increase her dose unless she discusses this with [place] first. We will check labs and [name] agrees to follow up with our clinic in 2 to 3 weeks with Marissa Potash, PA-C.   Hypothyroidism [name] was informed of the importance of good thyroid control to help with weight loss efforts. She was also informed that supertheraputic thyroid levels are dangerous and will not improve weight loss results. We will check labs and [name] agrees to follow up with our clinic in 2 to 3 weeks with Marissa Potash, PA-C.  Hyperlipidemia [name] was informed of the American  Heart Association Guidelines emphasizing intensive lifestyle modifications as the first line treatment for hyperlipidemia. We discussed many lifestyle modifications today in depth, and [name] will continue to work on decreasing saturated fats such as fatty red meat, butter and many fried foods. She will also increase vegetables and lean protein in her diet and continue to work on diet, exercise, and weight loss efforts. We  will check labs and [name] agrees to follow up with our clinic in 2 to 3 weeks with Marissa Potash, PA-C.  Obesity [name] is currently in the action stage of change. As such, her goal is to continue with weight loss efforts She has agreed to keep a food journal with 400-600 calories and 40 grams of protein at supper daily and follow the Category 3 plan [name] has been instructed to work up to a goal of 150 minutes of combined cardio and strengthening exercise per week for weight loss and overall health benefits. We discussed the following Behavioral Modification Strategies today: increasing lean protein intake, decreasing simple carbohydrates  and work on meal planning and easy cooking plans   [name] has agreed to follow up with our clinic in 2 to 3 weeks with Marissa Potash, PA-C. She was informed of the importance of frequent follow up visits to maximize her success with intensive lifestyle modifications for her multiple health conditions.  ALLERGIES: Allergies  Allergen Reactions  . Erythromycin   . Zyrtec [Cetirizine Hcl]     Only allergic to Zyrtec D -     MEDICATIONS: Current Outpatient Medications on File Prior to Visit  Medication Sig Dispense Refill  . acetaminophen (TYLENOL) 325 MG tablet Take 650 mg by mouth every 6 (six) hours as needed.    [name] albuterol (PROVENTIL) (2.5 MG/3ML) 0.083% nebulizer solution Take 3 mLs (2.5 mg total) by nebulization every 6 (six) hours as needed for wheezing or shortness of breath. 150 mL 1  . ARMOUR THYROID 120 MG tablet TAKE 1 TABLET BY MOUTH ONCE DAILY BEFOREBREAKFAST 90 tablet 0  . Azelastine-Fluticasone 137-50 MCG/ACT SUSP Place 1 spray into the nose daily.    . citalopram (CELEXA) 20 MG tablet Take 20 mg by mouth daily.      . clonazePAM (KLONOPIN) 0.5 MG tablet Take 0.5 mg by mouth 3 (three) times daily as needed for anxiety.    . diclofenac (VOLTAREN) 75 MG EC tablet TAKE 1 TABLET BY MOUTH 2 TIMES DAILY AS NEEDED 90 tablet 0  . fexofenadine  (ALLEGRA) 180 MG tablet Take 1 tablet (180 mg total) by mouth daily. 90 tablet 1  . gabapentin (NEURONTIN) 100 MG capsule Take 100 mg by mouth 3 (three) times daily.    [name] ibuprofen (ADVIL,MOTRIN) 200 MG tablet Take 200 mg by mouth every 6 (six) hours as needed.    . metFORMIN (GLUCOPHAGE) 500 MG tablet Take 1 tablet (500 mg total) by mouth 2 (two) times daily with a meal. 60 tablet 0  . montelukast (SINGULAIR) 10 MG tablet Take 1 tablet (10 mg total) by mouth at bedtime. 30 tablet 3  . omeprazole (PRILOSEC) 20 MG capsule Take 20 mg by mouth daily.    [name] VITAMIN D, CHOLECALCIFEROL, PO Take 50 mcg by mouth 2 (two) times daily.    [name] zolpidem (AMBIEN) 5 MG tablet Take 10 mg by mouth at bedtime as needed.      No current facility-administered medications on file prior to visit.     PAST MEDICAL HISTORY: Past Medical History:  Diagnosis Date  . Asthma   .  Chronic fatigue   . Chronic pain    back and hips  . Fibromyalgia   . High cholesterol   . Insomnia   . Prediabetes   . Scoliosis   . Sleep apnea     PAST SURGICAL HISTORY: Past Surgical History:  Procedure Laterality Date  . ABDOMINAL HYSTERECTOMY    . CESAREAN SECTION    . CHOLECYSTECTOMY    . SPINAL FUSION    . THYROIDECTOMY    . TONSILLECTOMY    . TUBAL LIGATION      SOCIAL HISTORY: Social History   Tobacco Use  . Smoking status: Former Smoker    Packs/day: 0.25    Years: 12.00    Pack years: 3.00    Types: Cigarettes    Last attempt to quit: 09/07/2004    Years since quitting: 13.9  . Smokeless tobacco: Never Used  Substance Use Topics  . Alcohol use: Yes    Comment: 4 times a year  . Drug use: No    FAMILY HISTORY: Family History  Problem Relation Age of Onset  . Cancer Maternal Uncle   . Heart disease Father   . High Cholesterol Father   . Heart disease Paternal Grandfather   . Breast cancer Maternal Grandmother   . Rheum arthritis Mother   . Obesity Mother   . Rheum arthritis Maternal Grandfather     . Rheum arthritis Maternal Aunt     ROS: Review of Systems  Constitutional: Positive for weight loss.  Cardiovascular: Negative for chest pain, palpitations and claudication.  Gastrointestinal: Negative for nausea and vomiting.  Musculoskeletal: Negative for myalgias.       Negative muscle weakness  Endo/Heme/Allergies:       Negative hypoglycemia Negative hold/cold intolerance    PHYSICAL EXAM: Blood pressure 108/76, pulse 89, temperature 98 F (36.7 C), temperature source Oral, height 5\' 1"  (1.549 m), weight 266 lb (120.7 kg), SpO2 92 %. Body mass index is 50.26 kg/m. Physical Exam Vitals signs reviewed.  Constitutional:      Appearance: Normal appearance. She is obese.  Cardiovascular:     Rate and Rhythm: Normal rate.     Pulses: Normal pulses.  Pulmonary:     Effort: Pulmonary effort is normal.  Musculoskeletal: Normal range of motion.  Skin:    General: Skin is warm and dry.  Neurological:     Mental Status: She is alert and oriented to person, place, and time.  Psychiatric:        Mood and Affect: Mood normal.        Behavior: Behavior normal.     RECENT LABS AND TESTS: BMET    Component Value Date/Time   NA 140 04/27/2018 1144   K 4.3 04/27/2018 1144   CL 107 (H) 04/27/2018 1144   CO2 20 04/27/2018 1144   GLUCOSE 111 (H) 04/27/2018 1144   GLUCOSE 90 10/01/2017 1041   BUN 17 04/27/2018 1144   CREATININE 0.57 04/27/2018 1144   CREATININE 0.53 10/01/2017 1041   CALCIUM 8.8 04/27/2018 1144   GFRNONAA 114 04/27/2018 1144   GFRNONAA 116 10/01/2017 1041   GFRAA 131 04/27/2018 1144   GFRAA 135 10/01/2017 1041   Lab Results  Component Value Date   HGBA1C 5.8 (H) 04/27/2018   HGBA1C 5.8 (H) 12/07/2017   HGBA1C 6.0 (H) 10/01/2017   Lab Results  Component Value Date   INSULIN 46.1 (H) 04/27/2018   INSULIN 20.1 12/07/2017   CBC    Component Value Date/Time  WBC 5.6 04/27/2018 1144   WBC 7.0 10/01/2017 1041   RBC 4.85 04/27/2018 1144   RBC 5.02  10/01/2017 1041   HGB 13.4 04/27/2018 1144   HCT 42.9 04/27/2018 1144   PLT 244 10/01/2017 1041   MCV 89 04/27/2018 1144   MCH 27.6 04/27/2018 1144   MCH 28.3 10/01/2017 1041   MCHC 31.2 (L) 04/27/2018 1144   MCHC 34.5 10/01/2017 1041   RDW 14.1 04/27/2018 1144   LYMPHSABS 1.3 04/27/2018 1144   EOSABS 0.1 04/27/2018 1144   BASOSABS 0.0 04/27/2018 1144   Iron/TIBC/Ferritin/ %Sat No results found for: IRON, TIBC, FERRITIN, IRONPCTSAT Lipid Panel     Component Value Date/Time   CHOL 150 04/27/2018 1144   TRIG 142 04/27/2018 1144   HDL 36 (L) 04/27/2018 1144   CHOLHDL 2.9 10/08/2017 1103   LDLCALC 86 04/27/2018 1144   LDLCALC 69 10/08/2017 1103   Hepatic Function Panel     Component Value Date/Time   PROT 6.9 04/27/2018 1144   ALBUMIN 4.1 04/27/2018 1144   AST 22 04/27/2018 1144   ALT 35 (H) 04/27/2018 1144   ALKPHOS 75 04/27/2018 1144   BILITOT <0.2 04/27/2018 1144      Component Value Date/Time   TSH 0.377 (L) 04/27/2018 1144   TSH 1.890 02/15/2018 1137   TSH 0.009 (L) 12/07/2017 1204      OBESITY BEHAVIORAL INTERVENTION VISIT  Today's visit was # 16   Starting weight: 292 lbs Starting date: 12/07/17 Today's weight : 266 lbs  Today's date: 08/29/2018 Total lbs lost to date: 26 At least 15 minutes were spent on discussing the following behavioral intervention visit.   ASK: We discussed the diagnosis of obesity with [name] today and Kataleia agreed to give [place] permission to discuss obesity behavioral modification therapy today.  ASSESS: Dondrea has the diagnosis of obesity and her BMI today is 50.29 Delanda is in the action stage of change   ADVISE: Shaunessy was educated on the multiple health risks of obesity as well as the benefit of weight loss to improve her health. She was advised of the need for long term treatment and the importance of lifestyle modifications to improve her current health and to decrease her risk of future health  problems.  AGREE: Multiple dietary modification options and treatment options were discussed and  Gargi agreed to follow the recommendations documented in the above note.  ARRANGE: Jailene was educated on the importance of frequent visits to treat obesity as outlined per CMS and USPSTF guidelines and agreed to schedule her next follow up appointment today.  I, Trixie Dredge, am acting as transcriptionist for Dennard Nip, MD  I have reviewed the above documentation for accuracy and completeness, and I agree with the above. -Dennard Nip, MD

## 2018-08-30 LAB — LIPID PANEL WITH LDL/HDL RATIO
Cholesterol, Total: 151 mg/dL (ref 100–199)
HDL: 29 mg/dL — ABNORMAL LOW (ref >39)
LDL CALC: 83 mg/dL (ref 0–99)
LDl/HDL Ratio: 2.9 ratio (ref 0.0–3.2)
Triglycerides: 196 mg/dL — ABNORMAL HIGH (ref 0–149)
VLDL Cholesterol Cal: 39 mg/dL (ref 5–40)

## 2018-08-30 LAB — COMPREHENSIVE METABOLIC PANEL
ALBUMIN: 4.3 g/dL (ref 3.5–5.5)
ALT: 28 IU/L (ref 0–32)
AST: 24 IU/L (ref 0–40)
Albumin/Globulin Ratio: 1.7 (ref 1.2–2.2)
Alkaline Phosphatase: 64 IU/L (ref 39–117)
BUN / CREAT RATIO: 21 (ref 9–23)
BUN: 13 mg/dL (ref 6–24)
Bilirubin Total: 0.3 mg/dL (ref 0.0–1.2)
CO2: 24 mmol/L (ref 20–29)
Calcium: 9.5 mg/dL (ref 8.7–10.2)
Chloride: 100 mmol/L (ref 96–106)
Creatinine, Ser: 0.63 mg/dL (ref 0.57–1.00)
GFR calc Af Amer: 126 mL/min/{1.73_m2} (ref >59)
GFR calc non Af Amer: 109 mL/min/{1.73_m2} (ref >59)
Globulin, Total: 2.6 g/dL (ref 1.5–4.5)
Glucose: 100 mg/dL — ABNORMAL HIGH (ref 65–99)
Potassium: 4.2 mmol/L (ref 3.5–5.2)
Sodium: 138 mmol/L (ref 134–144)
Total Protein: 6.9 g/dL (ref 6.0–8.5)

## 2018-08-30 LAB — HEMOGLOBIN A1C
Est. average glucose Bld gHb Est-mCnc: 114 mg/dL
Hgb A1c MFr Bld: 5.6 % (ref 4.8–5.6)

## 2018-08-30 LAB — VITAMIN D 25 HYDROXY (VIT D DEFICIENCY, FRACTURES): Vit D, 25-Hydroxy: 46.1 ng/mL (ref 30.0–100.0)

## 2018-08-30 LAB — INSULIN, RANDOM: INSULIN: 29.5 u[IU]/mL — ABNORMAL HIGH (ref 2.6–24.9)

## 2018-08-30 LAB — T4, FREE: Free T4: 0.93 ng/dL (ref 0.82–1.77)

## 2018-08-30 LAB — T3: T3, Total: 186 ng/dL — ABNORMAL HIGH (ref 71–180)

## 2018-08-30 LAB — TSH: TSH: 1.39 u[IU]/mL (ref 0.450–4.500)

## 2018-09-05 ENCOUNTER — Other Ambulatory Visit: Payer: Self-pay | Admitting: Family Medicine

## 2018-09-05 ENCOUNTER — Other Ambulatory Visit: Payer: Self-pay | Admitting: Nurse Practitioner

## 2018-09-05 DIAGNOSIS — J309 Allergic rhinitis, unspecified: Secondary | ICD-10-CM

## 2018-09-05 DIAGNOSIS — E038 Other specified hypothyroidism: Secondary | ICD-10-CM

## 2018-09-05 DIAGNOSIS — M797 Fibromyalgia: Secondary | ICD-10-CM

## 2018-09-06 NOTE — Telephone Encounter (Signed)
Please schedule patient for routine follow up in the next 30 days.

## 2018-09-09 ENCOUNTER — Ambulatory Visit: Payer: Medicaid Other | Admitting: Family Medicine

## 2018-09-17 ENCOUNTER — Encounter (INDEPENDENT_AMBULATORY_CARE_PROVIDER_SITE_OTHER): Payer: Self-pay | Admitting: Family Medicine

## 2018-09-19 ENCOUNTER — Encounter (INDEPENDENT_AMBULATORY_CARE_PROVIDER_SITE_OTHER): Payer: Self-pay

## 2018-09-19 ENCOUNTER — Ambulatory Visit (INDEPENDENT_AMBULATORY_CARE_PROVIDER_SITE_OTHER): Payer: Self-pay | Admitting: Physician Assistant

## 2018-09-19 NOTE — Telephone Encounter (Signed)
Please address

## 2018-09-20 ENCOUNTER — Ambulatory Visit: Payer: Medicaid Other | Admitting: Physical Therapy

## 2018-09-27 ENCOUNTER — Ambulatory Visit: Payer: Medicaid Other | Admitting: Physical Therapy

## 2018-09-28 ENCOUNTER — Ambulatory Visit (INDEPENDENT_AMBULATORY_CARE_PROVIDER_SITE_OTHER): Payer: Medicaid Other | Admitting: Family Medicine

## 2018-09-28 ENCOUNTER — Encounter (INDEPENDENT_AMBULATORY_CARE_PROVIDER_SITE_OTHER): Payer: Self-pay | Admitting: Family Medicine

## 2018-09-28 ENCOUNTER — Encounter: Payer: Medicaid Other | Admitting: Family Medicine

## 2018-09-28 VITALS — BP 107/71 | HR 66 | Ht 61.0 in | Wt 267.0 lb

## 2018-09-28 DIAGNOSIS — Z6841 Body Mass Index (BMI) 40.0 and over, adult: Secondary | ICD-10-CM | POA: Diagnosis not present

## 2018-09-28 DIAGNOSIS — R7303 Prediabetes: Secondary | ICD-10-CM | POA: Diagnosis not present

## 2018-09-28 NOTE — Progress Notes (Signed)
Office: [phone]  /  Fax: [phone]   HPI:   Chief Complaint: OBESITY [name] is here to discuss her progress with her obesity treatment plan. She is on the keep a food journal with 400-600 calories and 40 grams of protein at supper daily and follow the Category 3 plan and is following her eating plan approximately 40 % of the time. She states she is exercising 0 minutes 0 times per week. [name] has done well minimizing weight gain over the holidays. Life has settled back down and she is ready to get back on track.  Her weight is 267 lb (121.1 kg) today and has gained 1 pound since her last visit. She has lost 25 lbs since starting treatment with [place].  Pre-Diabetes [name] has a diagnosis of pre-diabetes based on her elevated Hgb A1c and was informed this puts her at greater risk of developing diabetes. She is on metformin, and she is working on diet and weight loss, and A1c is improving. She denies nausea, vomiting, or hypoglycemia, and notes decreased polyphagia while on plan.  ASSESSMENT AND PLAN:  Prediabetes  Class 3 severe obesity with serious comorbidity and body mass index (BMI) of 50.0 to 59.9 in adult, unspecified obesity type [facility])  PLAN:  Pre-Diabetes [name] will continue to work on weight loss, diet, exercise, and decreasing simple carbohydrates in her diet to help decrease the risk of diabetes. We dicussed metformin including benefits and risks. She was informed that eating too many simple carbohydrates or too many calories at one sitting increases the likelihood of GI side effects. [name] agrees to continue taking metformin, and she agrees to follow up with our clinic in 2 to 3 weeks as directed to monitor her progress.  I spent > than 50% of the 25 minute visit on counseling as documented in the note.  Obesity [name] is currently in the action stage of change. As such, her goal is to continue with weight loss efforts She has agreed to follow the Category 3 plan [name]  has been instructed to work up to a goal of 150 minutes of combined cardio and strengthening exercise per week for weight loss and overall health benefits. We discussed the following Behavioral Modification Strategies today: increasing lean protein intake, decreasing simple carbohydrates  and work on meal planning and easy cooking plans   [name] has agreed to follow up with our clinic in 2 to 3 weeks. She was informed of the importance of frequent follow up visits to maximize her success with intensive lifestyle modifications for her multiple health conditions.  ALLERGIES: Allergies  Allergen Reactions  . Erythromycin   . Zyrtec [Cetirizine Hcl]     Only allergic to Zyrtec D -     MEDICATIONS: Current Outpatient Medications on File Prior to Visit  Medication Sig Dispense Refill  . acetaminophen (TYLENOL) 325 MG tablet Take 650 mg by mouth every 6 (six) hours as needed.    [name] Kitchen albuterol (PROVENTIL) (2.5 MG/3ML) 0.083% nebulizer solution Take 3 mLs (2.5 mg total) by nebulization every 6 (six) hours as needed for wheezing or shortness of breath. 150 mL 1  . ARMOUR THYROID 120 MG tablet TAKE 1 TABLET BY MOUTH ONCE DAILY BEFOREBREAKFAST 30 tablet 0  . Azelastine-Fluticasone 137-50 MCG/ACT SUSP Place 1 spray into the nose daily.    . citalopram (CELEXA) 20 MG tablet Take 20 mg by mouth daily.      . clonazePAM (KLONOPIN) 0.5 MG tablet Take 0.5 mg by mouth 3 (three) times  daily as needed for anxiety.    . diclofenac (VOLTAREN) 75 MG EC tablet TAKE 1 TABLET BY MOUTH TWICE A DAY AS NEEDED 30 tablet 0  . fexofenadine (ALLEGRA) 180 MG tablet Take 1 tablet (180 mg total) by mouth daily. 90 tablet 1  . gabapentin (NEURONTIN) 100 MG capsule Take 100 mg by mouth 3 (three) times daily.    [name] Kitchen ibuprofen (ADVIL,MOTRIN) 200 MG tablet Take 200 mg by mouth every 6 (six) hours as needed.    . metFORMIN (GLUCOPHAGE) 500 MG tablet Take 1 tablet (500 mg total) by mouth 2 (two) times daily with a meal. 60 tablet 0  .  montelukast (SINGULAIR) 10 MG tablet TAKE 1 TABLET BY MOUTH AT BEDTIME 30 tablet 0  . omeprazole (PRILOSEC) 20 MG capsule Take 20 mg by mouth daily.    [name] Kitchen VITAMIN D, CHOLECALCIFEROL, PO Take 50 mcg by mouth 2 (two) times daily.    [name] Kitchen zolpidem (AMBIEN) 5 MG tablet Take 10 mg by mouth at bedtime as needed.      No current facility-administered medications on file prior to visit.     PAST MEDICAL HISTORY: Past Medical History:  Diagnosis Date  . Asthma   . Chronic fatigue   . Chronic pain    back and hips  . Fibromyalgia   . High cholesterol   . Insomnia   . Prediabetes   . Scoliosis   . Sleep apnea     PAST SURGICAL HISTORY: Past Surgical History:  Procedure Laterality Date  . ABDOMINAL HYSTERECTOMY    . CESAREAN SECTION    . CHOLECYSTECTOMY    . SPINAL FUSION    . THYROIDECTOMY    . TONSILLECTOMY    . TUBAL LIGATION      SOCIAL HISTORY: Social History   Tobacco Use  . Smoking status: Former Smoker    Packs/day: 0.25    Years: 12.00    Pack years: 3.00    Types: Cigarettes    Last attempt to quit: 09/07/2004    Years since quitting: 14.0  . Smokeless tobacco: Never Used  Substance Use Topics  . Alcohol use: Yes    Comment: 4 times a year  . Drug use: No    FAMILY HISTORY: Family History  Problem Relation Age of Onset  . Cancer Maternal Uncle   . Heart disease Father   . High Cholesterol Father   . Heart disease Paternal Grandfather   . Breast cancer Maternal Grandmother   . Rheum arthritis Mother   . Obesity Mother   . Rheum arthritis Maternal Grandfather   . Rheum arthritis Maternal Aunt     ROS: Review of Systems  Constitutional: Negative for weight loss.  Gastrointestinal: Negative for nausea and vomiting.  Endo/Heme/Allergies:       Negative hypoglycemia Positive polyphagia    PHYSICAL EXAM: Blood pressure 107/71, pulse 66, height 5\' 1"  (1.549 m), weight 267 lb (121.1 kg), SpO2 96 %. Body mass index is 50.45 kg/m. Physical Exam Vitals  signs reviewed.  Constitutional:      Appearance: Normal appearance. She is obese.  Cardiovascular:     Rate and Rhythm: Normal rate.     Pulses: Normal pulses.  Pulmonary:     Effort: Pulmonary effort is normal.     Breath sounds: Normal breath sounds.  Musculoskeletal: Normal range of motion.  Skin:    General: Skin is warm and dry.  Neurological:     Mental Status: She is alert and oriented to  person, place, and time.  Psychiatric:        Mood and Affect: Mood normal.        Behavior: Behavior normal.     RECENT LABS AND TESTS: BMET    Component Value Date/Time   NA 138 08/29/2018 1443   K 4.2 08/29/2018 1443   CL 100 08/29/2018 1443   CO2 24 08/29/2018 1443   GLUCOSE 100 (H) 08/29/2018 1443   GLUCOSE 90 10/01/2017 1041   BUN 13 08/29/2018 1443   CREATININE 0.63 08/29/2018 1443   CREATININE 0.53 10/01/2017 1041   CALCIUM 9.5 08/29/2018 1443   GFRNONAA 109 08/29/2018 1443   GFRNONAA 116 10/01/2017 1041   GFRAA 126 08/29/2018 1443   GFRAA 135 10/01/2017 1041   Lab Results  Component Value Date   HGBA1C 5.6 08/29/2018   HGBA1C 5.8 (H) 04/27/2018   HGBA1C 5.8 (H) 12/07/2017   HGBA1C 6.0 (H) 10/01/2017   Lab Results  Component Value Date   INSULIN 29.5 (H) 08/29/2018   INSULIN 46.1 (H) 04/27/2018   INSULIN 20.1 12/07/2017   CBC    Component Value Date/Time   WBC 5.6 04/27/2018 1144   WBC 7.0 10/01/2017 1041   RBC 4.85 04/27/2018 1144   RBC 5.02 10/01/2017 1041   HGB 13.4 04/27/2018 1144   HCT 42.9 04/27/2018 1144   PLT 244 10/01/2017 1041   MCV 89 04/27/2018 1144   MCH 27.6 04/27/2018 1144   MCH 28.3 10/01/2017 1041   MCHC 31.2 (L) 04/27/2018 1144   MCHC 34.5 10/01/2017 1041   RDW 14.1 04/27/2018 1144   LYMPHSABS 1.3 04/27/2018 1144   EOSABS 0.1 04/27/2018 1144   BASOSABS 0.0 04/27/2018 1144   Iron/TIBC/Ferritin/ %Sat No results found for: IRON, TIBC, FERRITIN, IRONPCTSAT Lipid Panel     Component Value Date/Time   CHOL 151 08/29/2018 1443    TRIG 196 (H) 08/29/2018 1443   HDL 29 (L) 08/29/2018 1443   CHOLHDL 2.9 10/08/2017 1103   LDLCALC 83 08/29/2018 1443   LDLCALC 69 10/08/2017 1103   Hepatic Function Panel     Component Value Date/Time   PROT 6.9 08/29/2018 1443   ALBUMIN 4.3 08/29/2018 1443   AST 24 08/29/2018 1443   ALT 28 08/29/2018 1443   ALKPHOS 64 08/29/2018 1443   BILITOT 0.3 08/29/2018 1443      Component Value Date/Time   TSH 1.390 08/29/2018 1443   TSH 0.377 (L) 04/27/2018 1144   TSH 1.890 02/15/2018 1137      OBESITY BEHAVIORAL INTERVENTION VISIT  Today's visit was # 17   Starting weight: 292 lbs Starting date: 12/07/17 Today's weight : 267 lbs  Today's date: 09/28/2018 Total lbs lost to date: 25    ASK: We discussed the diagnosis of obesity with [name] today and Nel agreed to give [place] permission to discuss obesity behavioral modification therapy today.  ASSESS: Harla has the diagnosis of obesity and her BMI today is 50.48 Risha is in the action stage of change   ADVISE: Marielis was educated on the multiple health risks of obesity as well as the benefit of weight loss to improve her health. She was advised of the need for long term treatment and the importance of lifestyle modifications to improve her current health and to decrease her risk of future health problems.  AGREE: Multiple dietary modification options and treatment options were discussed and  Baylea agreed to follow the recommendations documented in the above note.  ARRANGE: Chella was educated on  the importance of frequent visits to treat obesity as outlined per CMS and USPSTF guidelines and agreed to schedule her next follow up appointment today.  I, Trixie Dredge, am acting as transcriptionist for Dennard Nip, MD  I have reviewed the above documentation for accuracy and completeness, and I agree with the above. -Dennard Nip, MD

## 2018-09-30 ENCOUNTER — Encounter: Payer: Self-pay | Admitting: Family Medicine

## 2018-09-30 ENCOUNTER — Ambulatory Visit: Payer: Medicaid Other | Admitting: Family Medicine

## 2018-09-30 VITALS — BP 126/78 | HR 68 | Temp 98.1°F | Resp 16 | Ht 61.0 in | Wt 273.3 lb

## 2018-09-30 DIAGNOSIS — M8580 Other specified disorders of bone density and structure, unspecified site: Secondary | ICD-10-CM | POA: Insufficient documentation

## 2018-09-30 DIAGNOSIS — Z0000 Encounter for general adult medical examination without abnormal findings: Secondary | ICD-10-CM | POA: Diagnosis not present

## 2018-09-30 DIAGNOSIS — D1801 Hemangioma of skin and subcutaneous tissue: Secondary | ICD-10-CM

## 2018-09-30 DIAGNOSIS — Z114 Encounter for screening for human immunodeficiency virus [HIV]: Secondary | ICD-10-CM

## 2018-09-30 DIAGNOSIS — R059 Cough, unspecified: Secondary | ICD-10-CM

## 2018-09-30 DIAGNOSIS — Z1159 Encounter for screening for other viral diseases: Secondary | ICD-10-CM

## 2018-09-30 DIAGNOSIS — R05 Cough: Secondary | ICD-10-CM

## 2018-09-30 MED ORDER — BUDESONIDE-FORMOTEROL FUMARATE 80-4.5 MCG/ACT IN AERO: 2.0000 | INHALATION_SPRAY | Freq: Two times a day (BID) | RESPIRATORY_TRACT | 3 refills | Status: DC

## 2018-09-30 NOTE — Progress Notes (Signed)
Name: [name]   MRN: [id number]    DOB: 27-Jan-1976   Date:09/30/2018       Progress Note  Subjective  Chief Complaint  Chief Complaint  Patient presents with  . Annual Exam    HPI  Patient presents for annual CPE and ongoing cough.  Cough: ongoing since Thanksgiving - was treated with abx by GYN at 25 [facility].  She notes did improve after to abx tx, but has continued to have congested cough for about 2 months now.  Denies chest pain, shortness of breath, fevers/chills.  Sputum thick and yellow.  She is former smoker with 10 pack year history - quit about 17 years ago.  She is taking allegra and sinulair for allergies.   Diet: Doing well - seeing Dr. Leafy Mejia with [facility]. Has been doing a lot of stress eating since Christmas.  Exercise: She is not exercising.   USPSTF grade A and B recommendations    Office Visit from 09/30/2018 in [facility]  AUDIT-C Score  0     Depression: Sees Psychiatry; rarely taking clonopin; was seeing a therapist but hasn't been back in several months.  Taking Ambien '5mg'$  plus melatonin daily - discussed risk of this in combination.  Depression screen [facility] 2/9 09/30/2018 06/09/2018 05/13/2018 05/11/2018 04/25/2018  Decreased Interest '1 3 2 3 3  '$ Down, Depressed, Hopeless '2 2 1 2 2  '$ PHQ - 2 Score '3 5 3 5 5  '$ Altered sleeping '3 3 1 3 3  '$ Tired, decreased energy '3 3 1 3 3  '$ Change in appetite 0 0 1 2 0  Feeling bad or failure about yourself  2 0 '1 3 3  '$ Trouble concentrating '2 2 1 2 2  '$ Moving slowly or fidgety/restless 0 0 0 0 0  Suicidal thoughts 0 0 0 0 0  PHQ-9 Score '13 13 8 18 16  '$ Difficult doing work/chores - Somewhat difficult Somewhat difficult - Somewhat difficult   Hypertension: BP Readings from Last 3 Encounters:  09/30/18 126/78  09/28/18 107/71  08/29/18 108/76   Obesity: Heaviest weight was 293lbs.  Wt Readings from Last 3 Encounters:  09/30/18 273 lb 4.8 oz (124 kg)  09/28/18 267 lb (121.1  kg)  08/29/18 266 lb (120.7 kg)   BMI Readings from Last 3 Encounters:  09/30/18 51.64 kg/m  09/28/18 50.45 kg/m  08/29/18 50.26 kg/m    Hep C Screening: We will check today STD testing and prevention (HIV/chl/gon/syphilis): Will check HIV today; otherwise declines testing Intimate partner violence: No concerns Sexual History/Pain during Intercourse: No concerns Menstrual History/LMP/Abnormal Bleeding: S/p hysterectomy Incontinence Symptoms: No concerns  Advanced Care Planning: A voluntary discussion about advance care planning including the explanation and discussion of advance directives.  Discussed health care proxy and Living will, and the patient was able to identify a health care proxy as [name].  Patient does not have a living will at present time. If patient does have living will, I have requested they bring this to the clinic to be scanned in to their chart.  Breast cancer: Done with GYN No results found for: HMMAMMO  BRCA gene screening: Has family history. Had genetic screening with GYN - had normal results - lower risk than average population.  Cervical cancer screening: Still having Paps done, s/p hysterectomy.   Osteoporosis Screening: Has had one done many years ago due to hormone replacement - has osteopenia.  No results found for: HMDEXASCAN  Lipids:  Lab Results  Component Value Date   CHOL 151 08/29/2018   CHOL 150 04/27/2018   CHOL 148 12/07/2017   Lab Results  Component Value Date   HDL 29 (L) 08/29/2018   HDL 36 (L) 04/27/2018   HDL 53 12/07/2017   Lab Results  Component Value Date   LDLCALC 83 08/29/2018   LDLCALC 86 04/27/2018   LDLCALC 67 12/07/2017   Lab Results  Component Value Date   TRIG 196 (H) 08/29/2018   TRIG 142 04/27/2018   TRIG 138 12/07/2017   Lab Results  Component Value Date   CHOLHDL 2.9 10/08/2017   No results found for: LDLDIRECT  Glucose:  Glucose  Date Value Ref Range Status  08/29/2018 100 (H) 65 - 99 mg/dL  Final  04/27/2018 111 (H) 65 - 99 mg/dL Final  12/07/2017 87 65 - 99 mg/dL Final   Glucose, Bld  Date Value Ref Range Status  10/01/2017 90 65 - 99 mg/dL Final    Comment:    .            Fasting reference interval .   07/06/2007 87  Final    Skin cancer: She has several hemangiomas to her abdomen, RLE and right breast.  She would like a few of them removed if possible.  No concerning moles or lesions. Colorectal cancer: Denies family or personal history of colorectal cancer, no changes in BM's - no blood in stool, dark and tarry stool, mucus in stool, or constipation/diarrhea. Lung cancer:  Low Dose CT Chest recommended if Age 35-80 years, 30 pack-year currently smoking OR have quit w/in 15years. Patient does not qualify.   ECG: Denies chest pain, palpitations, shortness of breath.  Patient Active Problem List   Diagnosis Date Noted  . Osteopenia 09/30/2018  . Mild intermittent asthma with acute exacerbation 05/13/2018  . Allergic rhinitis 03/09/2018  . Tension headache 03/09/2018  . Other fatigue 12/07/2017  . Shortness of breath on exertion 12/07/2017  . Other specified hypothyroidism 12/07/2017  . Prediabetes 10/15/2017  . Gastroesophageal reflux disease without esophagitis 10/15/2017  . Nasal valve collapse 10/15/2017  . 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/01/2017  . Chronic midline back pain 10/01/2017  . Vitamin D deficiency 10/01/2017  . Anxiety 10/01/2017  . PCOS (polycystic ovarian syndrome) 10/01/2017  . Severe episode of recurrent major depressive disorder, without psychotic features ([name]) 10/01/2017  . BMI 50.0-59.9, adult ([name]) 10/01/2017  . Fibromyalgia 10/01/2017  . Postoperative hypothyroidism 10/01/2017  . OSA (obstructive sleep apnea) 02/26/2014    Past Surgical History:  Procedure Laterality Date  . ABDOMINAL HYSTERECTOMY    . CESAREAN SECTION    . CHOLECYSTECTOMY    . SPINAL FUSION    . THYROIDECTOMY    . TONSILLECTOMY    . TUBAL LIGATION       Family History  Problem Relation Age of Onset  . Cancer Maternal Uncle   . Heart disease Father   . High Cholesterol Father   . Heart disease Paternal Grandfather   . Breast cancer Maternal Grandmother   . Rheum arthritis Mother   . Obesity Mother   . Rheum arthritis Maternal Grandfather   . Rheum arthritis Maternal Aunt     Social History   Socioeconomic History  . Marital status: Married    Spouse name: Jozalynn Noyce  . Number of children: 3  . Years of education: Not on file  . Highest education level: Not on file  Occupational History  . Occupation: stay at home mother  Social Needs  . Financial resource strain: Not hard at all  . Food insecurity:    Worry: Never true    Inability: Never true  . Transportation needs:    Medical: No    Non-medical: No  Tobacco Use  . Smoking status: Former Smoker    Packs/day: 0.25    Years: 12.00    Pack years: 3.00    Types: Cigarettes    Last attempt to quit: 09/07/2004    Years since quitting: 14.0  . Smokeless tobacco: Never Used  Substance and Sexual Activity  . Alcohol use: Yes    Comment: 4 times a year  . Drug use: No  . Sexual activity: Yes    Partners: Male  Lifestyle  . Physical activity:    Days per week: 0 days    Minutes per session: 0 min  . Stress: Only a little  Relationships  . Social connections:    Talks on phone: More than three times a week    Gets together: More than three times a week    Attends religious service: More than 4 times per year    Active member of club or organization: Yes    Attends meetings of clubs or organizations: Never    Relationship status: Married  . Intimate partner violence:    Fear of current or ex partner: No    Emotionally abused: No    Physically abused: No    Forced sexual activity: No  Other Topics Concern  . Not on file  Social History Narrative  . Not on file     Current Outpatient Medications:  .  acetaminophen (TYLENOL) 325 MG tablet, Take 650 mg by  mouth every 6 (six) hours as needed., Disp: , Rfl:  .  albuterol (PROVENTIL) (2.5 MG/3ML) 0.083% nebulizer solution, Take 3 mLs (2.5 mg total) by nebulization every 6 (six) hours as needed for wheezing or shortness of breath., Disp: 150 mL, Rfl: 1 .  ARMOUR THYROID 120 MG tablet, TAKE 1 TABLET BY MOUTH ONCE DAILY BEFOREBREAKFAST, Disp: 30 tablet, Rfl: 0 .  Azelastine-Fluticasone 137-50 MCG/ACT SUSP, Place 1 spray into the nose daily., Disp: , Rfl:  .  citalopram (CELEXA) 20 MG tablet, Take 20 mg by mouth daily.  , Disp: , Rfl:  .  clonazePAM (KLONOPIN) 0.5 MG tablet, Take 0.5 mg by mouth 3 (three) times daily as needed for anxiety., Disp: , Rfl:  .  diclofenac (VOLTAREN) 75 MG EC tablet, TAKE 1 TABLET BY MOUTH TWICE A DAY AS NEEDED, Disp: 30 tablet, Rfl: 0 .  fexofenadine (ALLEGRA) 180 MG tablet, Take 1 tablet (180 mg total) by mouth daily., Disp: 90 tablet, Rfl: 1 .  gabapentin (NEURONTIN) 100 MG capsule, Take 100 mg by mouth 3 (three) times daily., Disp: , Rfl:  .  ibuprofen (ADVIL,MOTRIN) 200 MG tablet, Take 200 mg by mouth every 6 (six) hours as needed., Disp: , Rfl:  .  metFORMIN (GLUCOPHAGE) 500 MG tablet, Take 1 tablet (500 mg total) by mouth 2 (two) times daily with a meal., Disp: 60 tablet, Rfl: 0 .  montelukast (SINGULAIR) 10 MG tablet, TAKE 1 TABLET BY MOUTH AT BEDTIME, Disp: 30 tablet, Rfl: 0 .  omeprazole (PRILOSEC) 20 MG capsule, Take 20 mg by mouth daily., Disp: , Rfl:  .  VITAMIN D, CHOLECALCIFEROL, PO, Take 50 mcg by mouth 2 (two) times daily., Disp: , Rfl:  .  zolpidem (AMBIEN) 5 MG tablet, Take 10 mg by mouth at bedtime as  needed. , Disp: , Rfl:  .  budesonide-formoterol (SYMBICORT) 80-4.5 MCG/ACT inhaler, Inhale 2 puffs into the lungs 2 (two) times daily., Disp: 1 Inhaler, Rfl: 3  Allergies  Allergen Reactions  . Erythromycin   . Zyrtec [Cetirizine Hcl]     Only allergic to Zyrtec D -      ROS  Constitutional: Negative for fever or weight change.  Respiratory: Positive  for cough; negative for shortness of breath.   Cardiovascular: Negative for chest pain or palpitations.  Gastrointestinal: Negative for abdominal pain, no bowel changes.  Musculoskeletal: Negative for gait problem or joint swelling.  Skin: Negative for rash.  Neurological: Negative for dizziness or headache.  No other specific complaints in a complete review of systems (except as listed in HPI above).  Objective  Vitals:   09/30/18 1125  BP: 126/78  Pulse: 68  Resp: 16  Temp: 98.1 F (36.7 C)  SpO2: 97%  Weight: 273 lb 4.8 oz (124 kg)  Height: '5\' 1"'$  (1.549 m)    Body mass index is 51.64 kg/m.  Physical Exam  Constitutional: Patient appears well-developed and well-nourished. No distress.  HENT: Head: Normocephalic and atraumatic. Ears: B TMs ok, no erythema or effusion; Nose: Nose normal. Mouth/Throat: Oropharynx is clear and moist. No oropharyngeal exudate.  Eyes: Conjunctivae and EOM are normal. Pupils are equal, round, and reactive to light. No scleral icterus.  Neck: Normal range of motion. Neck supple. No JVD present. No thyromegaly present.  Cardiovascular: Normal rate, regular rhythm and normal heart sounds.  No murmur heard. No BLE edema. Pulmonary/Chest: Effort normal and breath sounds normal. No respiratory distress. Abdominal: Soft. Bowel sounds are normal, no distension. There is no tenderness. no masses Breast: no lumps or masses, no nipple discharge or rashes FEMALE GENITALIA: Deferred Musculoskeletal: Normal range of motion, no joint effusions. No gross deformities Neurological: he is alert and oriented to person, place, and time. No cranial nerve deficit. Coordination, balance, strength, speech and gait are normal.  Skin: Skin is warm and dry. No rash noted. No erythema.  Psychiatric: Patient has a normal mood and affect. behavior is normal. Judgment and thought content normal.  Recent Results (from the past 2160 hour(s))  Comprehensive metabolic panel      Status: Abnormal   Collection Time: 08/29/18  2:43 PM  Result Value Ref Range   Glucose 100 (H) 65 - 99 mg/dL   BUN 13 6 - 24 mg/dL   Creatinine, Ser 0.63 0.57 - 1.00 mg/dL   GFR calc non Af Amer 109 >59 mL/min/1.73   GFR calc Af Amer 126 >59 mL/min/1.73   BUN/Creatinine Ratio 21 9 - 23   Sodium 138 134 - 144 mmol/L   Potassium 4.2 3.5 - 5.2 mmol/L   Chloride 100 96 - 106 mmol/L   CO2 24 20 - 29 mmol/L   Calcium 9.5 8.7 - 10.2 mg/dL   Total Protein 6.9 6.0 - 8.5 g/dL   Albumin 4.3 3.5 - 5.5 g/dL   Globulin, Total 2.6 1.5 - 4.5 g/dL   Albumin/Globulin Ratio 1.7 1.2 - 2.2   Bilirubin Total 0.3 0.0 - 1.2 mg/dL   Alkaline Phosphatase 64 39 - 117 IU/L   AST 24 0 - 40 IU/L   ALT 28 0 - 32 IU/L  T3     Status: Abnormal   Collection Time: 08/29/18  2:43 PM  Result Value Ref Range   T3, Total 186 (H) 71 - 180 ng/dL  T4, free  Status: None   Collection Time: 08/29/18  2:43 PM  Result Value Ref Range   Free T4 0.93 0.82 - 1.77 ng/dL  TSH     Status: None   Collection Time: 08/29/18  2:43 PM  Result Value Ref Range   TSH 1.390 0.450 - 4.500 uIU/mL  Lipid Panel With LDL/HDL Ratio     Status: Abnormal   Collection Time: 08/29/18  2:43 PM  Result Value Ref Range   Cholesterol, Total 151 100 - 199 mg/dL   Triglycerides 196 (H) 0 - 149 mg/dL   HDL 29 (L) >39 mg/dL   VLDL Cholesterol Cal 39 5 - 40 mg/dL   LDL Calculated 83 0 - 99 mg/dL   LDl/HDL Ratio 2.9 0.0 - 3.2 ratio    Comment:                                     LDL/HDL Ratio                                             Men  Women                               1/2 Avg.Risk  1.0    1.5                                   Avg.Risk  3.6    3.2                                2X Avg.Risk  6.2    5.0                                3X Avg.Risk  8.0    6.1   Insulin, random     Status: Abnormal   Collection Time: 08/29/18  2:43 PM  Result Value Ref Range   INSULIN 29.5 (H) 2.6 - 24.9 uIU/mL  Hemoglobin A1c     Status: None    Collection Time: 08/29/18  2:43 PM  Result Value Ref Range   Hgb A1c MFr Bld 5.6 4.8 - 5.6 %    Comment:          Prediabetes: 5.7 - 6.4          Diabetes: >6.4          Glycemic control for adults with diabetes: <7.0    Est. average glucose Bld gHb Est-mCnc 114 mg/dL  VITAMIN D 25 Hydroxy (Vit-D Deficiency, Fractures)     Status: None   Collection Time: 08/29/18  2:43 PM  Result Value Ref Range   Vit D, 25-Hydroxy 46.1 30.0 - 100.0 ng/mL    Comment: Vitamin D deficiency has been defined by the [place] and an Endocrine Society practice guideline as a level of serum 25-OH vitamin D less than 20 ng/mL (1,2). The Endocrine Society went on to further define vitamin D insufficiency as a level between 21 and 29 ng/mL (2). 1. IOM (Institute of Medicine). 2010. Dietary reference  intakes for calcium and D. [place]: The    [employer]. 2. Holick MF, Binkley [place], Bischoff-Ferrari HA, et al.    Evaluation, treatment, and prevention of vitamin D    deficiency: an Endocrine Society clinical practice    guideline. JCEM. 2011 Jul; 96(7):1911-30.    PHQ2/9: Depression screen [facility] 2/9 09/30/2018 06/09/2018 05/13/2018 05/11/2018 04/25/2018  Decreased Interest '1 3 2 3 3  '$ Down, Depressed, Hopeless '2 2 1 2 2  '$ PHQ - 2 Score '3 5 3 5 5  '$ Altered sleeping '3 3 1 3 3  '$ Tired, decreased energy '3 3 1 3 3  '$ Change in appetite 0 0 1 2 0  Feeling bad or failure about yourself  2 0 '1 3 3  '$ Trouble concentrating '2 2 1 2 2  '$ Moving slowly or fidgety/restless 0 0 0 0 0  Suicidal thoughts 0 0 0 0 0  PHQ-9 Score '13 13 8 18 16  '$ Difficult doing work/chores - Somewhat difficult Somewhat difficult - Somewhat difficult    Fall Risk: Fall Risk  09/30/2018 07/01/2018 07/01/2018 06/09/2018 05/13/2018  Falls in the past year? 0 No No No No  Number falls in past yr: 0 - - - -  Injury with Fall? 0 - - - -  Follow up Falls evaluation completed - - - -    Assessment & Plan  1. Well woman exam (no  gynecological exam) -USPSTF grade A and B recommendations reviewed with patient; age-appropriate recommendations, preventive care, screening tests, etc discussed and encouraged; healthy living encouraged; see AVS for patient education given to patient -Discussed importance of 150 minutes of physical activity weekly, eat two servings of fish weekly, eat one serving of tree nuts ( cashews, pistachios, pecans, almonds.[name] Kitchen) every other day, eat 6 servings of fruit/vegetables daily and drink plenty of water and avoid sweet beverages.  - Hepatitis C antibody - HIV Antibody (routine testing w rflx) - Waiting on GYN for results of Mammograms  2. Cough - budesonide-formoterol (SYMBICORT) 80-4.5 MCG/ACT inhaler; Inhale 2 puffs into the lungs 2 (two) times daily.  Dispense: 1 Inhaler; Refill: 3  3. Osteopenia, unspecified location - We will await results from GYN  4. Hemangioma of skin - Ambulatory referral to Dermatology  5. Need for hepatitis C screening test - Hepatitis C antibody  6. Encounter for screening for HIV - HIV Antibody (routine testing w rflx)

## 2018-09-30 NOTE — Patient Instructions (Addendum)
HDL removes extra cholesterol and plaque buildup in your arteries and then sends it to your liver to remove it from your body; this helps reduce your risk of heart disease, heart attack, and stroke.  Foods that increase HDL include beans and legumes, whole grains, high-fiber fruits:prunes, apples, and pears; fatty fish- salmon, tuna, sardines; nuts, olive oil.   Fat and Cholesterol Restricted Eating Plan Getting too much fat and cholesterol in your diet may cause health problems. Choosing the right foods helps keep your fat and cholesterol at normal levels. This can keep you from getting certain diseases. Your doctor may recommend an eating plan that includes:  Total fat: ______% or less of total calories a day.  Saturated fat: ______% or less of total calories a day.  Cholesterol: less than _________mg a day.  Fiber: ______g a day. What are tips for following this plan? Meal planning  At meals, divide your plate into four equal parts: ? Fill one-half of your plate with vegetables and green salads. ? Fill one-fourth of your plate with whole grains. ? Fill one-fourth of your plate with low-fat (lean) protein foods.  Eat fish that is high in omega-3 fats at least two times a week. This includes mackerel, tuna, sardines, and salmon.  Eat foods that are high in fiber, such as whole grains, beans, apples, broccoli, carrots, peas, and barley. General tips   Work with your doctor to lose weight if you need to.  Avoid: ? Foods with added sugar. ? Fried foods. ? Foods with partially hydrogenated oils.  Limit alcohol intake to no more than 1 drink a day for nonpregnant women and 2 drinks a day for men. One drink equals 12 oz of beer, 5 oz of wine, or 1 oz of hard liquor. Reading food labels  Check food labels for: ? Trans fats. ? Partially hydrogenated oils. ? Saturated fat (g) in each serving. ? Cholesterol (mg) in each serving. ? Fiber (g) in each serving.  Choose foods with  healthy fats, such as: ? Monounsaturated fats. ? Polyunsaturated fats. ? Omega-3 fats.  Choose grain products that have whole grains. Look for the word "whole" as the first word in the ingredient list. Cooking  Cook foods using low-fat methods. These include baking, boiling, grilling, and broiling.  Eat more home-cooked foods. Eat at restaurants and buffets less often.  Avoid cooking using saturated fats, such as butter, cream, palm oil, palm kernel oil, and coconut oil. Recommended foods  Fruits  All fresh, canned (in natural juice), or frozen fruits. Vegetables  Fresh or frozen vegetables (raw, steamed, roasted, or grilled). Green salads. Grains  Whole grains, such as whole wheat or whole grain breads, crackers, cereals, and pasta. Unsweetened oatmeal, bulgur, barley, quinoa, or brown rice. Corn or whole wheat flour tortillas. Meats and other protein foods  Ground beef (85% or leaner), grass-fed beef, or beef trimmed of fat. Skinless chicken or [place]. Ground chicken or [place]. Pork trimmed of fat. All fish and seafood. Egg whites. Dried beans, peas, or lentils. Unsalted nuts or seeds. Unsalted canned beans. Nut butters without added sugar or oil. Dairy  Low-fat or nonfat dairy products, such as skim or 1% milk, 2% or reduced-fat cheeses, low-fat and fat-free ricotta or cottage cheese, or plain low-fat and nonfat yogurt. Fats and oils  Tub margarine without trans fats. Light or reduced-fat mayonnaise and salad dressings. Avocado. Olive, canola, sesame, or safflower oils. The items listed above may not be a complete list of foods and  beverages you can eat. Contact a dietitian for more information. Foods to avoid Fruits  Canned fruit in heavy syrup. Fruit in cream or butter sauce. Fried fruit. Vegetables  Vegetables cooked in cheese, cream, or butter sauce. Fried vegetables. Grains  White bread. White pasta. White rice. Cornbread. Bagels, pastries, and croissants. Crackers  and snack foods that contain trans fat and hydrogenated oils. Meats and other protein foods  Fatty cuts of meat. Ribs, chicken wings, bacon, sausage, bologna, salami, chitterlings, fatback, hot dogs, bratwurst, and packaged lunch meats. Liver and organ meats. Whole eggs and egg yolks. Chicken and [place] with skin. Fried meat. Dairy  Whole or 2% milk, cream, half-and-half, and cream cheese. Whole milk cheeses. Whole-fat or sweetened yogurt. Full-fat cheeses. Nondairy creamers and whipped toppings. Processed cheese, cheese spreads, and cheese curds. Beverages  Alcohol. Sugar-sweetened drinks such as sodas, lemonade, and fruit drinks. Fats and oils  Butter, stick margarine, lard, shortening, ghee, or bacon fat. Coconut, palm kernel, and palm oils. Sweets and desserts  Corn syrup, sugars, honey, and molasses. Candy. Jam and jelly. Syrup. Sweetened cereals. Cookies, pies, cakes, donuts, muffins, and ice cream. The items listed above may not be a complete list of foods and beverages you should avoid. Contact a dietitian for more information. Summary  Choosing the right foods helps keep your fat and cholesterol at normal levels. This can keep you from getting certain diseases.  At meals, fill one-half of your plate with vegetables and green salads.  Eat high-fiber foods, like whole grains, beans, apples, carrots, peas, and barley.  Limit added sugar, saturated fats, alcohol, and fried foods. This information is not intended to replace advice given to you by your health care provider. Make sure you discuss any questions you have with your health care provider. Document Released: 02/23/2012 Document Revised: 04/27/2018 Document Reviewed: 05/11/2017 Elsevier Interactive Patient Education  2019 [place] Years, Female Preventive care refers to lifestyle choices and visits with your health care provider that can promote health and wellness. What does preventive care  include?   A yearly physical exam. This is also called an annual well check.  Dental exams once or twice a year.  Routine eye exams. Ask your health care provider how often you should have your eyes checked.  Personal lifestyle choices, including: ? Daily care of your teeth and gums. ? Regular physical activity. ? Eating a healthy diet. ? Avoiding tobacco and drug use. ? Limiting alcohol use. ? Practicing safe sex. ? Taking low-dose aspirin daily starting at age 90. ? Taking vitamin and mineral supplements as recommended by your health care provider. What happens during an annual well check? The services and screenings done by your health care provider during your annual well check will depend on your age, overall health, lifestyle risk factors, and family history of disease. Counseling Your health care provider may ask you questions about your:  Alcohol use.  Tobacco use.  Drug use.  Emotional well-being.  Home and relationship well-being.  Sexual activity.  Eating habits.  Work and work Statistician.  Method of birth control.  Menstrual cycle.  Pregnancy history. Screening You may have the following tests or measurements:  Height, weight, and BMI.  Blood pressure.  Lipid and cholesterol levels. These may be checked every 5 years, or more frequently if you are over 65 years old.  Skin check.  Lung cancer screening. You may have this screening every year starting at age 46 if you have a  30-pack-year history of smoking and currently smoke or have quit within the past 15 years.  Colorectal cancer screening. All adults should have this screening starting at age 68 and continuing until age 62. Your health care provider may recommend screening at age 43. You will have tests every 1-10 years, depending on your results and the type of screening test. People at increased risk should start screening at an earlier age. Screening tests may include: ? Guaiac-based fecal  occult blood testing. ? Fecal immunochemical test (FIT). ? Stool DNA test. ? Virtual colonoscopy. ? Sigmoidoscopy. During this test, a flexible tube with a tiny camera (sigmoidoscope) is used to examine your rectum and lower colon. The sigmoidoscope is inserted through your anus into your rectum and lower colon. ? Colonoscopy. During this test, a long, thin, flexible tube with a tiny camera (colonoscope) is used to examine your entire colon and rectum.  Hepatitis C blood test.  Hepatitis B blood test.  Sexually transmitted disease (STD) testing.  Diabetes screening. This is done by checking your blood sugar (glucose) after you have not eaten for a while (fasting). You may have this done every 1-3 years.  Mammogram. This may be done every 1-2 years. Talk to your health care provider about when you should start having regular mammograms. This may depend on whether you have a family history of breast cancer.  BRCA-related cancer screening. This may be done if you have a family history of breast, ovarian, tubal, or peritoneal cancers.  Pelvic exam and Pap test. This may be done every 3 years starting at age 37. Starting at age 51, this may be done every 5 years if you have a Pap test in combination with an HPV test.  Bone density scan. This is done to screen for osteoporosis. You may have this scan if you are at high risk for osteoporosis. Discuss your test results, treatment options, and if necessary, the need for more tests with your health care provider. Vaccines Your health care provider may recommend certain vaccines, such as:  Influenza vaccine. This is recommended every year.  Tetanus, diphtheria, and acellular pertussis (Tdap, Td) vaccine. You may need a Td booster every 10 years.  Varicella vaccine. You may need this if you have not been vaccinated.  Zoster vaccine. You may need this after age 67.  Measles, mumps, and rubella (MMR) vaccine. You may need at least one dose of MMR  if you were born in 1957 or later. You may also need a second dose.  Pneumococcal 13-valent conjugate (PCV13) vaccine. You may need this if you have certain conditions and were not previously vaccinated.  Pneumococcal polysaccharide (PPSV23) vaccine. You may need one or two doses if you smoke cigarettes or if you have certain conditions.  Meningococcal vaccine. You may need this if you have certain conditions.  Hepatitis A vaccine. You may need this if you have certain conditions or if you travel or work in places where you may be exposed to hepatitis A.  Hepatitis B vaccine. You may need this if you have certain conditions or if you travel or work in places where you may be exposed to hepatitis B.  Haemophilus influenzae type b (Hib) vaccine. You may need this if you have certain conditions. Talk to your health care provider about which screenings and vaccines you need and how often you need them. This information is not intended to replace advice given to you by your health care provider. Make sure you discuss any questions  you have with your health care provider. Document Released: 09/20/2015 Document Revised: 10/14/2017 Document Reviewed: 06/25/2015 Elsevier Interactive Patient Education  2019 [employer].

## 2018-10-01 LAB — HIV ANTIBODY (ROUTINE TESTING W REFLEX): HIV 1&2 Ab, 4th Generation: NONREACTIVE

## 2018-10-01 LAB — HEPATITIS C ANTIBODY
Hepatitis C Ab: NONREACTIVE
SIGNAL TO CUT-OFF: 0.02 (ref <1.00)

## 2018-10-04 ENCOUNTER — Ambulatory Visit: Payer: Medicaid Other | Admitting: Physical Therapy

## 2018-10-11 ENCOUNTER — Other Ambulatory Visit: Payer: Self-pay | Admitting: Family Medicine

## 2018-10-11 ENCOUNTER — Encounter: Payer: Medicaid Other | Admitting: Physical Therapy

## 2018-10-11 DIAGNOSIS — E038 Other specified hypothyroidism: Secondary | ICD-10-CM

## 2018-10-11 DIAGNOSIS — J309 Allergic rhinitis, unspecified: Secondary | ICD-10-CM

## 2018-10-12 ENCOUNTER — Other Ambulatory Visit: Payer: Self-pay | Admitting: Family Medicine

## 2018-10-12 DIAGNOSIS — Z8669 Personal history of other diseases of the nervous system and sense organs: Secondary | ICD-10-CM

## 2018-10-12 DIAGNOSIS — H9201 Otalgia, right ear: Secondary | ICD-10-CM

## 2018-10-12 DIAGNOSIS — J309 Allergic rhinitis, unspecified: Secondary | ICD-10-CM

## 2018-10-12 DIAGNOSIS — M797 Fibromyalgia: Secondary | ICD-10-CM

## 2018-10-17 ENCOUNTER — Telehealth: Payer: Self-pay | Admitting: Family Medicine

## 2018-10-17 ENCOUNTER — Encounter: Payer: Self-pay | Admitting: Family Medicine

## 2018-10-17 NOTE — Telephone Encounter (Signed)
Please call patient to find out if she is still receiving depo-testosterone injections from her GYN. If so, please add to her medication list.  Records reviewed from [facility] - Had genetic testing for breast cancer risk and genetic testing is negative.

## 2018-10-19 ENCOUNTER — Ambulatory Visit (INDEPENDENT_AMBULATORY_CARE_PROVIDER_SITE_OTHER): Payer: Self-pay | Admitting: Family Medicine

## 2018-10-19 ENCOUNTER — Ambulatory Visit (INDEPENDENT_AMBULATORY_CARE_PROVIDER_SITE_OTHER): Payer: Medicaid Other | Admitting: Family Medicine

## 2018-10-19 ENCOUNTER — Encounter (INDEPENDENT_AMBULATORY_CARE_PROVIDER_SITE_OTHER): Payer: Self-pay

## 2018-10-19 ENCOUNTER — Encounter (INDEPENDENT_AMBULATORY_CARE_PROVIDER_SITE_OTHER): Payer: Self-pay | Admitting: Family Medicine

## 2018-10-19 VITALS — BP 120/86 | HR 70 | Temp 97.5°F | Ht 61.0 in | Wt 268.0 lb

## 2018-10-19 DIAGNOSIS — R7303 Prediabetes: Secondary | ICD-10-CM

## 2018-10-19 DIAGNOSIS — Z6841 Body Mass Index (BMI) 40.0 and over, adult: Secondary | ICD-10-CM | POA: Diagnosis not present

## 2018-10-19 DIAGNOSIS — Z0000 Encounter for general adult medical examination without abnormal findings: Secondary | ICD-10-CM | POA: Diagnosis not present

## 2018-10-20 ENCOUNTER — Encounter (INDEPENDENT_AMBULATORY_CARE_PROVIDER_SITE_OTHER): Payer: Self-pay | Admitting: Family Medicine

## 2018-10-20 DIAGNOSIS — Z6841 Body Mass Index (BMI) 40.0 and over, adult: Secondary | ICD-10-CM

## 2018-10-20 NOTE — Progress Notes (Signed)
Office: [phone]  /  Fax: [phone]   HPI:   Chief Complaint: OBESITY [name] is here to discuss her progress with her obesity treatment plan. She is on the Category 3 plan and is following her eating plan approximately 60% of the time. She states she is exercising 0 minutes 0 times per week. [name] reports increased stress eating over the last few weeks. She likes the structure of the Category 3 plan and has gotten back on the plan over the past week.  Her weight is 268 lb (121.6 kg) today and has had a weight gain of 1 pound since her last visit. She has lost 24 lbs since starting treatment with [place].  Pre-Diabetes [name] has a diagnosis of prediabetes based on her elevated Hgb A1c and was informed this puts her at greater risk of developing diabetes. [name]'s last Hgb A1C was at 5.6 on 08/29/2018, down from 5.8 on 04/27/2018. She is taking metformin currently and continues to work on diet and exercise to decrease risk of diabetes.  She denies hypoglycemia.  ASSESSMENT AND PLAN:  Prediabetes  Class 3 severe obesity with serious comorbidity and body mass index (BMI) of 50.0 to 59.9 in adult, unspecified obesity type [facility])  PLAN:  Pre-Diabetes [name] will continue to work on weight loss, exercise, and decreasing simple carbohydrates in her diet to help decrease the risk of diabetes. We dicussed metformin including benefits and risks. She was informed that eating too many simple carbohydrates or too many calories at one sitting increases the likelihood of GI side effects. [name] is on metformin for now and a prescription was not written today. [name] agrees to follow-up with our clinic in 2 weeks.  I spent > than 50% of the 15 minute visit on counseling as documented in the note.  Obesity [name] is currently in the action stage of change. As such, her goal is to continue with weight loss efforts. She has agreed to follow the Category 3 plan. [name] has not been prescribed exercise at this  time.  We discussed the following Behavioral Modification Strategies today: decreasing simple carbohydrates, ways to avoid night time snacking, better snacking choices, and planning for success.  [name] has agreed to follow up with our clinic in 2 weeks. She was informed of the importance of frequent follow up visits to maximize her success with intensive lifestyle modifications for her multiple health conditions.  ALLERGIES: Allergies  Allergen Reactions  . Erythromycin   . Zyrtec [Cetirizine Hcl]     Only allergic to Zyrtec D -     MEDICATIONS: Current Outpatient Medications on File Prior to Visit  Medication Sig Dispense Refill  . acetaminophen (TYLENOL) 325 MG tablet Take 650 mg by mouth every 6 (six) hours as needed.    [name] Kitchen albuterol (PROVENTIL) (2.5 MG/3ML) 0.083% nebulizer solution Take 3 mLs (2.5 mg total) by nebulization every 6 (six) hours as needed for wheezing or shortness of breath. 150 mL 1  . ARMOUR THYROID 120 MG tablet TAKE 1 TABLET BY MOUTH ONCE DAILY BEFOREBREAKFAST. PER MD PLEASE SCHEDULE A FOLLOW UP FOR 90 DAY SUPPLY OF MEDICATION. 30 tablet 0  . Azelastine-Fluticasone 137-50 MCG/ACT SUSP Place 1 spray into the nose daily.    . budesonide-formoterol (SYMBICORT) 80-4.5 MCG/ACT inhaler Inhale 2 puffs into the lungs 2 (two) times daily. 1 Inhaler 3  . citalopram (CELEXA) 20 MG tablet Take 20 mg by mouth daily.      . clonazePAM (KLONOPIN) 0.5 MG tablet Take 0.5 mg by  mouth 3 (three) times daily as needed for anxiety.    . diclofenac (VOLTAREN) 75 MG EC tablet TAKE 1 TABLET BY MOUTH TWICE A DAY AS NEEDED 30 tablet 0  . fexofenadine (ALLEGRA) 180 MG tablet TAKE 1 TABLET BY MOUTH ONCE DAILY 90 tablet 1  . gabapentin (NEURONTIN) 100 MG capsule Take 100 mg by mouth 3 (three) times daily.    [name] Kitchen ibuprofen (ADVIL,MOTRIN) 200 MG tablet Take 200 mg by mouth every 6 (six) hours as needed.    . metFORMIN (GLUCOPHAGE) 500 MG tablet Take 1 tablet (500 mg total) by mouth 2 (two) times  daily with a meal. 60 tablet 0  . montelukast (SINGULAIR) 10 MG tablet TAKE ONE TABLET BY MOUTH AT BEDTIME 30 tablet 0  . omeprazole (PRILOSEC) 20 MG capsule Take 20 mg by mouth daily.    [name] Kitchen VITAMIN D, CHOLECALCIFEROL, PO Take 50 mcg by mouth 2 (two) times daily.    [name] Kitchen zolpidem (AMBIEN) 5 MG tablet Take 10 mg by mouth at bedtime as needed.      No current facility-administered medications on file prior to visit.     PAST MEDICAL HISTORY: Past Medical History:  Diagnosis Date  . Asthma   . Chronic fatigue   . Chronic pain    back and hips  . Fibromyalgia   . High cholesterol   . Insomnia   . Prediabetes   . Scoliosis   . Sleep apnea     PAST SURGICAL HISTORY: Past Surgical History:  Procedure Laterality Date  . ABDOMINAL HYSTERECTOMY    . CESAREAN SECTION    . CHOLECYSTECTOMY    . SPINAL FUSION    . THYROIDECTOMY    . TONSILLECTOMY    . TUBAL LIGATION      SOCIAL HISTORY: Social History   Tobacco Use  . Smoking status: Former Smoker    Packs/day: 0.25    Years: 12.00    Pack years: 3.00    Types: Cigarettes    Last attempt to quit: 09/07/2004    Years since quitting: 14.1  . Smokeless tobacco: Never Used  Substance Use Topics  . Alcohol use: Yes    Comment: 4 times a year  . Drug use: No    FAMILY HISTORY: Family History  Problem Relation Age of Onset  . Cancer Maternal Uncle   . Heart disease Father   . High Cholesterol Father   . Heart disease Paternal Grandfather   . Breast cancer Maternal Grandmother   . Rheum arthritis Mother   . Obesity Mother   . Rheum arthritis Maternal Grandfather   . Rheum arthritis Maternal Aunt     ROS: Review of Systems  Constitutional: Negative for weight loss.  Endo/Heme/Allergies:       Negative for hypoglycemia.   PHYSICAL EXAM: Blood pressure 120/86, pulse 70, temperature (!) 97.5 F (36.4 C), temperature source Oral, height 5\' 1"  (1.549 m), weight 268 lb (121.6 kg), SpO2 94 %. Body mass index is 50.64  kg/m. Physical Exam Vitals signs reviewed.  Constitutional:      Appearance: Normal appearance. She is obese.  Cardiovascular:     Rate and Rhythm: Normal rate.     Pulses: Normal pulses.  Pulmonary:     Effort: Pulmonary effort is normal.     Breath sounds: Normal breath sounds.  Musculoskeletal: Normal range of motion.  Skin:    General: Skin is warm and dry.  Neurological:     Mental Status: She is alert and  oriented to person, place, and time.  Psychiatric:        Behavior: Behavior normal.   RECENT LABS AND TESTS: BMET    Component Value Date/Time   NA 138 08/29/2018 1443   K 4.2 08/29/2018 1443   CL 100 08/29/2018 1443   CO2 24 08/29/2018 1443   GLUCOSE 100 (H) 08/29/2018 1443   GLUCOSE 90 10/01/2017 1041   BUN 13 08/29/2018 1443   CREATININE 0.63 08/29/2018 1443   CREATININE 0.53 10/01/2017 1041   CALCIUM 9.5 08/29/2018 1443   GFRNONAA 109 08/29/2018 1443   GFRNONAA 116 10/01/2017 1041   GFRAA 126 08/29/2018 1443   GFRAA 135 10/01/2017 1041   Lab Results  Component Value Date   HGBA1C 5.6 08/29/2018   HGBA1C 5.8 (H) 04/27/2018   HGBA1C 5.8 (H) 12/07/2017   HGBA1C 6.0 (H) 10/01/2017   Lab Results  Component Value Date   INSULIN 29.5 (H) 08/29/2018   INSULIN 46.1 (H) 04/27/2018   INSULIN 20.1 12/07/2017   CBC    Component Value Date/Time   WBC 5.6 04/27/2018 1144   WBC 7.0 10/01/2017 1041   RBC 4.85 04/27/2018 1144   RBC 5.02 10/01/2017 1041   HGB 13.4 04/27/2018 1144   HCT 42.9 04/27/2018 1144   PLT 244 10/01/2017 1041   MCV 89 04/27/2018 1144   MCH 27.6 04/27/2018 1144   MCH 28.3 10/01/2017 1041   MCHC 31.2 (L) 04/27/2018 1144   MCHC 34.5 10/01/2017 1041   RDW 14.1 04/27/2018 1144   LYMPHSABS 1.3 04/27/2018 1144   EOSABS 0.1 04/27/2018 1144   BASOSABS 0.0 04/27/2018 1144   Iron/TIBC/Ferritin/ %Sat No results found for: IRON, TIBC, FERRITIN, IRONPCTSAT Lipid Panel     Component Value Date/Time   CHOL 151 08/29/2018 1443   TRIG 196  (H) 08/29/2018 1443   HDL 29 (L) 08/29/2018 1443   CHOLHDL 2.9 10/08/2017 1103   LDLCALC 83 08/29/2018 1443   LDLCALC 69 10/08/2017 1103   Hepatic Function Panel     Component Value Date/Time   PROT 6.9 08/29/2018 1443   ALBUMIN 4.3 08/29/2018 1443   AST 24 08/29/2018 1443   ALT 28 08/29/2018 1443   ALKPHOS 64 08/29/2018 1443   BILITOT 0.3 08/29/2018 1443      Component Value Date/Time   TSH 1.390 08/29/2018 1443   TSH 0.377 (L) 04/27/2018 1144   TSH 1.890 02/15/2018 1137   Results for Strom, Virginie S "Jernee Gasbarro" (MRN [id number]) as of 10/20/2018 08:05  Ref. Range 08/29/2018 14:43  Vitamin D, 25-Hydroxy Latest Ref Range: 30.0 - 100.0 ng/mL 46.1   OBESITY BEHAVIORAL INTERVENTION VISIT  Today's visit was #18  Starting weight: 292 lbs Starting date: 12/07/2017 Today's weight: 268 lbs Today's date: 10/19/2018 Total lbs lost to date: 24  ASK: We discussed the diagnosis of obesity with [name] today and [name] agreed to give [place] permission to discuss obesity behavioral modification therapy today.  ASSESS: Kammi has the diagnosis of obesity and her BMI today is 50.64. Chelesa is in the action stage of change.  ADVISE: Zaineb was educated on the multiple health risks of obesity as well as the benefit of weight loss to improve her health. She was advised of the need for long term treatment and the importance of lifestyle modifications to improve her current health and to decrease her risk of future health problems.  AGREE: Multiple dietary modification options and treatment options were discussed and  Pinkie agreed to follow the recommendations documented  in the above note.  ARRANGE: Valincia was educated on the importance of frequent visits to treat obesity as outlined per CMS and USPSTF guidelines and agreed to schedule her next follow up appointment today.  IMichaelene Song, am acting as Location manager for [employer], FNP-C.  I have reviewed the above documentation for  accuracy and completeness, and I agree with the above.  - Jerline Linzy, FNP-C.

## 2018-10-27 NOTE — Telephone Encounter (Signed)
Left message for patient to call.

## 2018-10-30 ENCOUNTER — Encounter: Payer: Self-pay | Admitting: Family Medicine

## 2018-10-31 ENCOUNTER — Ambulatory Visit: Payer: Medicaid Other | Admitting: Family Medicine

## 2018-11-01 ENCOUNTER — Ambulatory Visit: Payer: Medicaid Other | Admitting: Physical Therapy

## 2018-11-08 ENCOUNTER — Ambulatory Visit (INDEPENDENT_AMBULATORY_CARE_PROVIDER_SITE_OTHER): Payer: Medicaid Other | Admitting: Family Medicine

## 2018-11-08 ENCOUNTER — Encounter (INDEPENDENT_AMBULATORY_CARE_PROVIDER_SITE_OTHER): Payer: Self-pay | Admitting: Family Medicine

## 2018-11-08 VITALS — BP 135/81 | HR 83 | Ht 61.0 in | Wt 269.0 lb

## 2018-11-08 DIAGNOSIS — F418 Other specified anxiety disorders: Secondary | ICD-10-CM | POA: Diagnosis not present

## 2018-11-08 DIAGNOSIS — E66813 Obesity, class 3: Secondary | ICD-10-CM

## 2018-11-08 DIAGNOSIS — Z6841 Body Mass Index (BMI) 40.0 and over, adult: Secondary | ICD-10-CM

## 2018-11-09 NOTE — Progress Notes (Signed)
Office: [phone]  /  Fax: [phone]   HPI:   Chief Complaint: OBESITY [name] is here to discuss her progress with her obesity treatment plan. She is on the Category 3 plan and is following her eating plan approximately 10 % of the time. She states she is exercising 0 minutes 0 times per week. [name]'s depression was flared up and she is not concentrating on weight loss. She notes increased emotional eating and decreased motivation to improve her health. She is considering weight loss Mejia and has questions.  Her weight is 269 lb (122 kg) today and has gained 1 pound since her last visit. She has lost 23 lbs since starting treatment with [place].  Depression with Anxiety [name] is on Celexa and feels anxiety is worsening. She sometimes wake up from sleep feeling panic, she has never seen a psychiatrist. She does see a therapist but doesn't feel it is helping much. She shows no sign of suicidal or homicidal ideations.  ASSESSMENT AND PLAN:  Depression with anxiety - Plan: Ambulatory referral to Psychiatry  Class 3 severe obesity with serious comorbidity and body mass index (BMI) of 50.0 to 59.9 in adult, unspecified obesity type ([name])  PLAN:  Depression with Anxiety We discussed behavior modification techniques today to help [name] deal with her anxiety and depression. We have sent a referral to [name] psychiatry and [name] agrees to follow up with our clinic in 2 to 3 weeks.   I spent > than 50% of the 25 minute visit on counseling as documented in the note.  Obesity [name] is currently in the action stage of change. As such, her goal is to maintain weight for now She has agreed to follow the Category 3 plan [name] has been instructed to work up to a goal of 150 minutes of combined cardio and strengthening exercise per week for weight loss and overall health benefits. We discussed the following Behavioral Modification Strategies today: increasing lean protein intake,  decreasing simple carbohydrates  and work on meal planning and easy cooking plans [name]'s goal is to maintain weight while working on depression. She was referred to [name] for bariatric information session.  [name] has agreed to follow up with our clinic in 2 to 3 weeks. She was informed of the importance of frequent follow up visits to maximize her success with intensive lifestyle modifications for her multiple health conditions.  ALLERGIES: Allergies  Allergen Reactions  . Erythromycin   . Zyrtec [Cetirizine Hcl]     Only allergic to Zyrtec D -     MEDICATIONS: Current Outpatient Medications on File Prior to Visit  Medication Sig Dispense Refill  . acetaminophen (TYLENOL) 325 MG tablet Take 650 mg by mouth every 6 (six) hours as needed.    [name] Kitchen albuterol (PROVENTIL) (2.5 MG/3ML) 0.083% nebulizer solution Take 3 mLs (2.5 mg total) by nebulization every 6 (six) hours as needed for wheezing or shortness of breath. 150 mL 1  . ARMOUR THYROID 120 MG tablet TAKE 1 TABLET BY MOUTH ONCE DAILY BEFOREBREAKFAST. PER MD PLEASE SCHEDULE A FOLLOW UP FOR 90 DAY SUPPLY OF MEDICATION. 30 tablet 0  . Azelastine-Fluticasone 137-50 MCG/ACT SUSP Place 1 spray into the nose daily.    . budesonide-formoterol (SYMBICORT) 80-4.5 MCG/ACT inhaler Inhale 2 puffs into the lungs 2 (two) times daily. 1 Inhaler 3  . citalopram (CELEXA) 20 MG tablet Take 20 mg by mouth daily.      . clonazePAM (KLONOPIN) 0.5 MG tablet Take 0.5 mg  by mouth 3 (three) times daily as needed for anxiety.    . diclofenac (VOLTAREN) 75 MG EC tablet TAKE 1 TABLET BY MOUTH TWICE A DAY AS NEEDED 30 tablet 0  . fexofenadine (ALLEGRA) 180 MG tablet TAKE 1 TABLET BY MOUTH ONCE DAILY 90 tablet 1  . gabapentin (NEURONTIN) 100 MG capsule Take 100 mg by mouth 3 (three) times daily.    [name] Kitchen ibuprofen (ADVIL,MOTRIN) 200 MG tablet Take 200 mg by mouth every 6 (six) hours as needed.    . metFORMIN (GLUCOPHAGE) 500 MG tablet Take 1 tablet (500 mg  total) by mouth 2 (two) times daily with a meal. 60 tablet 0  . montelukast (SINGULAIR) 10 MG tablet TAKE ONE TABLET BY MOUTH AT BEDTIME 30 tablet 0  . omeprazole (PRILOSEC) 20 MG capsule Take 20 mg by mouth daily.    [name] Kitchen VITAMIN D, CHOLECALCIFEROL, PO Take 50 mcg by mouth 2 (two) times daily.    [name] Kitchen zolpidem (AMBIEN) 5 MG tablet Take 10 mg by mouth at bedtime as needed.      No current facility-administered medications on file prior to visit.     PAST MEDICAL HISTORY: Past Medical History:  Diagnosis Date  . Asthma   . Chronic fatigue   . Chronic pain    back and hips  . Fibromyalgia   . High cholesterol   . Insomnia   . Prediabetes   . Scoliosis   . Sleep apnea     PAST SURGICAL HISTORY: Past Surgical History:  Procedure Laterality Date  . ABDOMINAL HYSTERECTOMY    . CESAREAN SECTION    . CHOLECYSTECTOMY    . SPINAL FUSION    . THYROIDECTOMY    . TONSILLECTOMY    . TUBAL LIGATION      SOCIAL HISTORY: Social History   Tobacco Use  . Smoking status: Former Smoker    Packs/day: 0.25    Years: 12.00    Pack years: 3.00    Types: Cigarettes    Last attempt to quit: 09/07/2004    Years since quitting: 14.1  . Smokeless tobacco: Never Used  Substance Use Topics  . Alcohol use: Yes    Comment: 4 times a year  . Drug use: No    FAMILY HISTORY: Family History  Problem Relation Age of Onset  . Cancer Maternal Uncle   . Heart disease Father   . High Cholesterol Father   . Heart disease Paternal Grandfather   . Breast cancer Maternal Grandmother   . Rheum arthritis Mother   . Obesity Mother   . Rheum arthritis Maternal Grandfather   . Rheum arthritis Maternal Aunt     ROS: Review of Systems  Constitutional: Negative for weight loss.  Psychiatric/Behavioral: Positive for depression. Negative for suicidal ideas.       + Anxiety    PHYSICAL EXAM: Blood pressure 135/81, pulse 83, height 5\' 1"  (1.549 m), weight 269 lb (122 kg), SpO2 98 %. Body mass index is  50.83 kg/m. Physical Exam Vitals signs reviewed.  Constitutional:      Appearance: Normal appearance. She is obese.  Cardiovascular:     Rate and Rhythm: Normal rate.     Pulses: Normal pulses.  Pulmonary:     Effort: Pulmonary effort is normal.     Breath sounds: Normal breath sounds.  Musculoskeletal: Normal range of motion.  Skin:    General: Skin is warm and dry.  Neurological:     Mental Status: She is alert and oriented  to person, place, and time.  Psychiatric:        Mood and Affect: Mood normal.        Behavior: Behavior normal.     RECENT LABS AND TESTS: BMET    Component Value Date/Time   NA 138 08/29/2018 1443   K 4.2 08/29/2018 1443   CL 100 08/29/2018 1443   CO2 24 08/29/2018 1443   GLUCOSE 100 (H) 08/29/2018 1443   GLUCOSE 90 10/01/2017 1041   BUN 13 08/29/2018 1443   CREATININE 0.63 08/29/2018 1443   CREATININE 0.53 10/01/2017 1041   CALCIUM 9.5 08/29/2018 1443   GFRNONAA 109 08/29/2018 1443   GFRNONAA 116 10/01/2017 1041   GFRAA 126 08/29/2018 1443   GFRAA 135 10/01/2017 1041   Lab Results  Component Value Date   HGBA1C 5.6 08/29/2018   HGBA1C 5.8 (H) 04/27/2018   HGBA1C 5.8 (H) 12/07/2017   HGBA1C 6.0 (H) 10/01/2017   Lab Results  Component Value Date   INSULIN 29.5 (H) 08/29/2018   INSULIN 46.1 (H) 04/27/2018   INSULIN 20.1 12/07/2017   CBC    Component Value Date/Time   WBC 5.6 04/27/2018 1144   WBC 7.0 10/01/2017 1041   RBC 4.85 04/27/2018 1144   RBC 5.02 10/01/2017 1041   HGB 13.4 04/27/2018 1144   HCT 42.9 04/27/2018 1144   PLT 244 10/01/2017 1041   MCV 89 04/27/2018 1144   MCH 27.6 04/27/2018 1144   MCH 28.3 10/01/2017 1041   MCHC 31.2 (L) 04/27/2018 1144   MCHC 34.5 10/01/2017 1041   RDW 14.1 04/27/2018 1144   LYMPHSABS 1.3 04/27/2018 1144   EOSABS 0.1 04/27/2018 1144   BASOSABS 0.0 04/27/2018 1144   Iron/TIBC/Ferritin/ %Sat No results found for: IRON, TIBC, FERRITIN, IRONPCTSAT Lipid Panel     Component Value  Date/Time   CHOL 151 08/29/2018 1443   TRIG 196 (H) 08/29/2018 1443   HDL 29 (L) 08/29/2018 1443   CHOLHDL 2.9 10/08/2017 1103   LDLCALC 83 08/29/2018 1443   LDLCALC 69 10/08/2017 1103   Hepatic Function Panel     Component Value Date/Time   PROT 6.9 08/29/2018 1443   ALBUMIN 4.3 08/29/2018 1443   AST 24 08/29/2018 1443   ALT 28 08/29/2018 1443   ALKPHOS 64 08/29/2018 1443   BILITOT 0.3 08/29/2018 1443      Component Value Date/Time   TSH 1.390 08/29/2018 1443   TSH 0.377 (L) 04/27/2018 1144   TSH 1.890 02/15/2018 1137      OBESITY BEHAVIORAL INTERVENTION VISIT  Today's visit was # 19   Starting weight: 292 lbs Starting date: 12/07/17 Today's weight : 269 lbs Today's date: 11/08/2018 Total lbs lost to date: 23    11/08/2018  Height 5\' 1"  (1.549 m)  Weight 269 lb (122 kg)  BMI (Calculated) 50.85  BLOOD PRESSURE - SYSTOLIC 637  BLOOD PRESSURE - DIASTOLIC 81   Body Fat % 54 %  Total Body Water (lbs) 91.8 lbs     ASK: We discussed the diagnosis of obesity with [name] today and [name] agreed to give [place] permission to discuss obesity behavioral modification therapy today.  ASSESS: [name] has the diagnosis of obesity and her BMI today is 50.85 [name] is in the action stage of change   ADVISE: [name] was educated on the multiple health risks of obesity as well as the benefit of weight loss to improve her health. She was advised of the need for long term treatment and the importance  of lifestyle modifications to improve her current health and to decrease her risk of future health problems.  AGREE: Multiple dietary modification options and treatment options were discussed and  [name] agreed to follow the recommendations documented in the above note.  ARRANGE: [name] was educated on the importance of frequent visits to treat obesity as outlined per CMS and USPSTF guidelines and agreed to schedule her next follow up appointment today.  I, Trixie [name], am acting as  transcriptionist for Dennard Nip, MD  I have reviewed the above documentation for accuracy and completeness, and I agree with the above. -Dennard Nip, MD

## 2018-11-10 ENCOUNTER — Other Ambulatory Visit: Payer: Self-pay | Admitting: Family Medicine

## 2018-11-10 DIAGNOSIS — M797 Fibromyalgia: Secondary | ICD-10-CM

## 2018-11-10 DIAGNOSIS — E038 Other specified hypothyroidism: Secondary | ICD-10-CM

## 2018-11-10 DIAGNOSIS — J309 Allergic rhinitis, unspecified: Secondary | ICD-10-CM

## 2018-11-24 ENCOUNTER — Ambulatory Visit (INDEPENDENT_AMBULATORY_CARE_PROVIDER_SITE_OTHER): Payer: Self-pay | Admitting: Family Medicine

## 2018-11-25 ENCOUNTER — Encounter (INDEPENDENT_AMBULATORY_CARE_PROVIDER_SITE_OTHER): Payer: Self-pay | Admitting: Family Medicine

## 2018-11-28 ENCOUNTER — Ambulatory Visit (INDEPENDENT_AMBULATORY_CARE_PROVIDER_SITE_OTHER): Payer: Self-pay | Admitting: Family Medicine

## 2018-11-28 ENCOUNTER — Encounter (INDEPENDENT_AMBULATORY_CARE_PROVIDER_SITE_OTHER): Payer: Self-pay

## 2018-11-29 ENCOUNTER — Encounter: Payer: Self-pay | Admitting: Family Medicine

## 2018-11-29 ENCOUNTER — Telehealth: Payer: Self-pay | Admitting: Family Medicine

## 2018-11-29 ENCOUNTER — Other Ambulatory Visit: Payer: Self-pay | Admitting: Emergency Medicine

## 2018-11-29 DIAGNOSIS — J4521 Mild intermittent asthma with (acute) exacerbation: Secondary | ICD-10-CM

## 2018-11-29 DIAGNOSIS — J069 Acute upper respiratory infection, unspecified: Secondary | ICD-10-CM

## 2018-11-29 MED ORDER — ALBUTEROL SULFATE (2.5 MG/3ML) 0.083% IN NEBU: 2.5000 mg | INHALATION_SOLUTION | Freq: Four times a day (QID) | RESPIRATORY_TRACT | 1 refills | Status: DC | PRN

## 2018-11-29 NOTE — Telephone Encounter (Signed)
Do not see albuterol inhaler on medication list

## 2018-11-29 NOTE — Telephone Encounter (Signed)
Patient called requesting abluterol inhaler refill be sent to [facility].

## 2018-11-30 MED ORDER — ALBUTEROL SULFATE (2.5 MG/3ML) 0.083% IN NEBU: 2.5000 mg | INHALATION_SOLUTION | Freq: Four times a day (QID) | RESPIRATORY_TRACT | 1 refills | Status: DC | PRN

## 2018-12-01 ENCOUNTER — Encounter (INDEPENDENT_AMBULATORY_CARE_PROVIDER_SITE_OTHER): Payer: Self-pay

## 2018-12-01 ENCOUNTER — Encounter: Payer: Self-pay | Admitting: Family Medicine

## 2018-12-01 ENCOUNTER — Other Ambulatory Visit: Payer: Self-pay

## 2018-12-01 ENCOUNTER — Ambulatory Visit (INDEPENDENT_AMBULATORY_CARE_PROVIDER_SITE_OTHER): Payer: Medicaid Other | Admitting: Family Medicine

## 2018-12-01 DIAGNOSIS — J988 Other specified respiratory disorders: Secondary | ICD-10-CM

## 2018-12-01 DIAGNOSIS — J4521 Mild intermittent asthma with (acute) exacerbation: Secondary | ICD-10-CM | POA: Diagnosis not present

## 2018-12-01 MED ORDER — DOXYCYCLINE HYCLATE 100 MG PO TABS: 100.0000 mg | ORAL_TABLET | Freq: Two times a day (BID) | ORAL | 0 refills | Status: AC

## 2018-12-01 MED ORDER — GUAIFENESIN ER 600 MG PO TB12: 600.0000 mg | ORAL_TABLET | Freq: Two times a day (BID) | ORAL | 0 refills | Status: DC

## 2018-12-01 NOTE — Progress Notes (Signed)
Name: [name]   MRN: [id number]    DOB: June 27, 1976   Date:12/01/2018       Progress Note  Subjective  Chief Complaint  Chief Complaint  Patient presents with  . Cough    chest tightness for 5 days  . Sore Throat    I connected with [name] on 12/01/18 at  2:00 PM EDT by a video enabled telemedicine application and verified that I am speaking with the correct person using two identifiers.  I discussed the limitations of evaluation and management by telemedicine and the availability of in person appointments. The patient expressed understanding and agreed to proceed.  HPI  Pt presents via video with concern for coughing (productive, thick mucus), chest tightness Today having more fatigue, and body aches, some mild nasal congestion, sore throat.  No fever, NVD. Se has albuterol nebulizer at home and symbicort BID that she has been using, taking allegra daily.  Patient Active Problem List   Diagnosis Date Noted  . Class 3 severe obesity with serious comorbidity and body mass index (BMI) of 50.0 to 59.9 in adult ([place]) 10/20/2018  . Osteopenia 09/30/2018  . Mild intermittent asthma with acute exacerbation 05/13/2018  . Allergic rhinitis 03/09/2018  . Tension headache 03/09/2018  . Other fatigue 12/07/2017  . Shortness of breath on exertion 12/07/2017  . Other specified hypothyroidism 12/07/2017  . Prediabetes 10/15/2017  . Gastroesophageal reflux disease without esophagitis 10/15/2017  . Nasal valve collapse 10/15/2017  . 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/01/2017  . Chronic midline back pain 10/01/2017  . Vitamin D deficiency 10/01/2017  . Anxiety 10/01/2017  . PCOS (polycystic ovarian syndrome) 10/01/2017  . Severe episode of recurrent major depressive disorder, without psychotic features ([place]) 10/01/2017  . BMI 50.0-59.9, adult ([place]) 10/01/2017  . Fibromyalgia 10/01/2017  . Postoperative hypothyroidism 10/01/2017  . OSA (obstructive sleep apnea) 02/26/2014     Social History   Tobacco Use  . Smoking status: Former Smoker    Packs/day: 0.25    Years: 12.00    Pack years: 3.00    Types: Cigarettes    Last attempt to quit: 09/07/2004    Years since quitting: 14.2  . Smokeless tobacco: Never Used  Substance Use Topics  . Alcohol use: Yes    Comment: 4 times a year     Current Outpatient Medications:  .  acetaminophen (TYLENOL) 325 MG tablet, Take 650 mg by mouth every 6 (six) hours as needed., Disp: , Rfl:  .  albuterol (PROVENTIL) (2.5 MG/3ML) 0.083% nebulizer solution, Take 3 mLs (2.5 mg total) by nebulization every 6 (six) hours as needed for wheezing or shortness of breath., Disp: 150 mL, Rfl: 1 .  albuterol (PROVENTIL) (2.5 MG/3ML) 0.083% nebulizer solution, Take 3 mLs (2.5 mg total) by nebulization every 6 (six) hours as needed for wheezing or shortness of breath., Disp: 150 mL, Rfl: 1 .  ARMOUR THYROID 120 MG tablet, TAKE 1 TABLET BY MOUTH ONCE A DAY BEFOREBREAKFAST, Disp: 30 tablet, Rfl: 0 .  Azelastine-Fluticasone 137-50 MCG/ACT SUSP, Place 1 spray into the nose daily., Disp: , Rfl:  .  budesonide-formoterol (SYMBICORT) 80-4.5 MCG/ACT inhaler, Inhale 2 puffs into the lungs 2 (two) times daily., Disp: 1 Inhaler, Rfl: 3 .  citalopram (CELEXA) 20 MG tablet, Take 20 mg by mouth daily.  , Disp: , Rfl:  .  clonazePAM (KLONOPIN) 0.5 MG tablet, Take 0.5 mg by mouth 3 (three) times daily as needed for anxiety., Disp: , Rfl:  .  diclofenac (VOLTAREN) 75 MG EC tablet, TAKE 1 TABLET BY MOUTH TWICE DAILY AS NEEDED, Disp: 30 tablet, Rfl: 0 .  fexofenadine (ALLEGRA) 180 MG tablet, TAKE 1 TABLET BY MOUTH ONCE DAILY, Disp: 90 tablet, Rfl: 1 .  gabapentin (NEURONTIN) 100 MG capsule, Take 100 mg by mouth 3 (three) times daily., Disp: , Rfl:  .  ibuprofen (ADVIL,MOTRIN) 200 MG tablet, Take 200 mg by mouth every 6 (six) hours as needed., Disp: , Rfl:  .  montelukast (SINGULAIR) 10 MG tablet, TAKE 1 TABLET BY MOUTH DAILY AT BEDTIME, Disp: 30 tablet, Rfl: 0 .   omeprazole (PRILOSEC) 20 MG capsule, Take 20 mg by mouth daily., Disp: , Rfl:  .  VITAMIN D, CHOLECALCIFEROL, PO, Take 50 mcg by mouth 2 (two) times daily., Disp: , Rfl:  .  zolpidem (AMBIEN) 5 MG tablet, Take 10 mg by mouth at bedtime as needed. , Disp: , Rfl:  .  metFORMIN (GLUCOPHAGE) 500 MG tablet, Take 1 tablet (500 mg total) by mouth 2 (two) times daily with a meal., Disp: 60 tablet, Rfl: 0  Allergies  Allergen Reactions  . Erythromycin   . Zyrtec [Cetirizine Hcl]     Only allergic to Zyrtec D -     I personally reviewed active problem list, medication list, allergies, notes from last encounter, lab results with the patient/caregiver today.  ROS  Ten systems reviewed and is negative except as mentioned in HPI  Objective  Virtual encounter, vitals not obtained except for respiratory rate of 20/min  There is no height or weight on file to calculate BMI.  Nursing Note and Vital Signs reviewed.  Physical Exam  Constitutional: Patient appears well-developed and well-nourished. Obese No distress.  HEENT: head atraumatic, normocephalic Cardiovascular: Normal rate, regular rhythm and normal heart sounds.  No murmur heard. No BLE edema. Pulmonary/Chest: Effort normal and congested cough is intermittent throughout interview. Psychiatric: Patient has a normal mood and affect. behavior is normal. Judgment and thought content normal.  No results found for this or any previous visit (from the past 72 hour(s)).  Assessment & Plan  1. Respiratory infection - doxycycline (VIBRA-TABS) 100 MG tablet; Take 1 tablet (100 mg total) by mouth 2 (two) times daily for 10 days.  Dispense: 20 tablet; Refill: 0 - guaiFENesin (MUCINEX) 600 MG 12 hr tablet; Take 1 tablet (600 mg total) by mouth 2 (two) times daily.  Dispense: 20 tablet; Refill: 0 - Did discuss COVID-19, her pattern of slowly worsening over 4 days does not quite fit the pattern in the minimal literature available, however she does  exhibit cough and fatigue.  Therefore, I did provide quarantine advisement in her AVS for her to review and follow until her symptoms improve.  2. Mild intermittent asthma with acute exacerbation - Patient has history of respiratory infections previously and this feels similar to her.  She does have productive congested cough throughout examination.  Due to her history of asthma and likely exacerbation, we will rx antibiotic today.  She declines prednisone, will continue symbicort and albuterol as prescribed, add mucinex.  -Red flags and when to present for emergency care or RTC including fever >101.47F, chest pain, shortness of breath, new/worsening/un-resolving symptoms, reviewed with patient at time of visit. Follow up and care instructions discussed and provided in AVS. - I discussed the assessment and treatment plan with the patient. The patient was provided an opportunity to ask questions and all were answered. The patient agreed with the plan and demonstrated an understanding of  the instructions.  I provided 12 minutes of non-face-to-face time during this encounter.  Hubbard Hartshorn, FNP

## 2018-12-01 NOTE — Patient Instructions (Signed)
Person Under Monitoring Name: [name]  Location: [address] [place] [address]   Infection Prevention Recommendations for Individuals Confirmed to have, or Being Evaluated for, 2019 Novel Coronavirus (COVID-19) Infection Who Receive Care at Home  Individuals who are confirmed to have, or are being evaluated for, COVID-19 should follow the prevention steps below until a healthcare provider or local or state health department says they can return to normal activities.  Stay home except to get medical care You should restrict activities outside your home, except for getting medical care. Do not go to work, school, or public areas, and do not use public transportation or taxis.  Call ahead before visiting your doctor Before your medical appointment, call the healthcare provider and tell them that you have, or are being evaluated for, COVID-19 infection. This will help the healthcare provider's office take steps to keep other people from getting infected. Ask your healthcare provider to call the local or state health department.  Monitor your symptoms Seek prompt medical attention if your illness is worsening (e.g., difficulty breathing). Before going to your medical appointment, call the healthcare provider and tell them that you have, or are being evaluated for, COVID-19 infection. Ask your healthcare provider to call the local or state health department.  Wear a facemask You should wear a facemask that covers your nose and mouth when you are in the same room with other people and when you visit a healthcare provider. People who live with or visit you should also wear a facemask while they are in the same room with you.  Separate yourself from other people in your home As much as possible, you should stay in a different room from other people in your home. Also, you should use a separate bathroom, if available.  Avoid sharing household items You should not share  dishes, drinking glasses, cups, eating utensils, towels, bedding, or other items with other people in your home. After using these items, you should wash them thoroughly with soap and water.  Cover your coughs and sneezes Cover your mouth and nose with a tissue when you cough or sneeze, or you can cough or sneeze into your sleeve. Throw used tissues in a lined trash can, and immediately wash your hands with soap and water for at least 20 seconds or use an alcohol-based hand rub.  Wash your [facility] your hands often and thoroughly with soap and water for at least 20 seconds. You can use an alcohol-based hand sanitizer if soap and water are not available and if your hands are not visibly dirty. Avoid touching your eyes, nose, and mouth with unwashed hands.   Prevention Steps for Caregivers and Household Members of Individuals Confirmed to have, or Being Evaluated for, COVID-19 Infection Being Cared for in the Home  If you live with, or provide care at home for, a person confirmed to have, or being evaluated for, COVID-19 infection please follow these guidelines to prevent infection:  Follow healthcare provider's instructions Make sure that you understand and can help the patient follow any healthcare provider instructions for all care.  Provide for the patient's basic needs You should help the patient with basic needs in the home and provide support for getting groceries, prescriptions, and other personal needs.  Monitor the patient's symptoms If they are getting sicker, call his or her medical provider and tell them that the patient has, or is being evaluated for, COVID-19 infection. This will help the healthcare provider's office  take steps to keep other people from getting infected. Ask the healthcare provider to call the local or state health department.  Limit the number of people who have contact with the patient  If possible, have only one caregiver for the patient.  Other  household members should stay in another home or place of residence. If this is not possible, they should stay  in another room, or be separated from the patient as much as possible. Use a separate bathroom, if available.  Restrict visitors who do not have an essential need to be in the home.  Keep older adults, very young children, and other sick people away from the patient Keep older adults, very young children, and those who have compromised immune systems or chronic health conditions away from the patient. This includes people with chronic heart, lung, or kidney conditions, diabetes, and cancer.  Ensure good ventilation Make sure that shared spaces in the home have good air flow, such as from an air conditioner or an opened window, weather permitting.  Wash your hands often  Wash your hands often and thoroughly with soap and water for at least 20 seconds. You can use an alcohol based hand sanitizer if soap and water are not available and if your hands are not visibly dirty.  Avoid touching your eyes, nose, and mouth with unwashed hands.  Use disposable paper towels to dry your hands. If not available, use dedicated cloth towels and replace them when they become wet.  Wear a facemask and gloves  Wear a disposable facemask at all times in the room and gloves when you touch or have contact with the patient's blood, body fluids, and/or secretions or excretions, such as sweat, saliva, sputum, nasal mucus, vomit, urine, or feces.  Ensure the mask fits over your nose and mouth tightly, and do not touch it during use.  Throw out disposable facemasks and gloves after using them. Do not reuse.  Wash your hands immediately after removing your facemask and gloves.  If your personal clothing becomes contaminated, carefully remove clothing and launder. Wash your hands after handling contaminated clothing.  Place all used disposable facemasks, gloves, and other waste in a lined container before  disposing them with other household waste.  Remove gloves and wash your hands immediately after handling these items.  Do not share dishes, glasses, or other household items with the patient  Avoid sharing household items. You should not share dishes, drinking glasses, cups, eating utensils, towels, bedding, or other items with a patient who is confirmed to have, or being evaluated for, COVID-19 infection.  After the person uses these items, you should wash them thoroughly with soap and water.  Wash laundry thoroughly  Immediately remove and wash clothes or bedding that have blood, body fluids, and/or secretions or excretions, such as sweat, saliva, sputum, nasal mucus, vomit, urine, or feces, on them.  Wear gloves when handling laundry from the patient.  Read and follow directions on labels of laundry or clothing items and detergent. In general, wash and dry with the warmest temperatures recommended on the label.  Clean all areas the individual has used often  Clean all touchable surfaces, such as counters, tabletops, doorknobs, bathroom fixtures, toilets, phones, keyboards, tablets, and bedside tables, every day. Also, clean any surfaces that may have blood, body fluids, and/or secretions or excretions on them.  Wear gloves when cleaning surfaces the patient has come in contact with.  Use a diluted bleach solution (e.g., dilute bleach with 1 part  bleach and 10 parts water) or a household disinfectant with a label that says EPA-registered for coronaviruses. To make a bleach solution at home, add 1 tablespoon of bleach to 1 quart (4 cups) of water. For a larger supply, add  cup of bleach to 1 gallon (16 cups) of water.  Read labels of cleaning products and follow recommendations provided on product labels. Labels contain instructions for safe and effective use of the cleaning product including precautions you should take when applying the product, such as wearing gloves or eye protection  and making sure you have good ventilation during use of the product.  Remove gloves and wash hands immediately after cleaning.  Monitor yourself for signs and symptoms of illness Caregivers and household members are considered close contacts, should monitor their health, and will be asked to limit movement outside of the home to the extent possible. Follow the monitoring steps for close contacts listed on the symptom monitoring form.   ? If you have additional questions, contact your local health department or call the epidemiologist on call at [phone] (available 24/7). ? This guidance is subject to change. For the most up-to-date guidance from [facility], please refer to their website: [email or web address]

## 2018-12-02 ENCOUNTER — Encounter: Payer: Self-pay | Admitting: Family Medicine

## 2018-12-02 MED ORDER — ALBUTEROL SULFATE HFA 108 (90 BASE) MCG/ACT IN AERS: 2.0000 | INHALATION_SPRAY | Freq: Four times a day (QID) | RESPIRATORY_TRACT | 0 refills | Status: DC | PRN

## 2018-12-07 ENCOUNTER — Other Ambulatory Visit: Payer: Self-pay | Admitting: Family Medicine

## 2018-12-07 DIAGNOSIS — E038 Other specified hypothyroidism: Secondary | ICD-10-CM

## 2018-12-07 DIAGNOSIS — J309 Allergic rhinitis, unspecified: Secondary | ICD-10-CM

## 2018-12-14 ENCOUNTER — Other Ambulatory Visit: Payer: Self-pay | Admitting: Family Medicine

## 2018-12-14 DIAGNOSIS — M797 Fibromyalgia: Secondary | ICD-10-CM

## 2018-12-26 ENCOUNTER — Other Ambulatory Visit: Payer: Self-pay

## 2018-12-26 ENCOUNTER — Ambulatory Visit (INDEPENDENT_AMBULATORY_CARE_PROVIDER_SITE_OTHER): Payer: Medicaid Other | Admitting: Physician Assistant

## 2018-12-26 ENCOUNTER — Encounter (INDEPENDENT_AMBULATORY_CARE_PROVIDER_SITE_OTHER): Payer: Self-pay | Admitting: Physician Assistant

## 2018-12-26 DIAGNOSIS — E7849 Other hyperlipidemia: Secondary | ICD-10-CM

## 2018-12-26 DIAGNOSIS — Z6841 Body Mass Index (BMI) 40.0 and over, adult: Secondary | ICD-10-CM | POA: Diagnosis not present

## 2018-12-26 DIAGNOSIS — R7303 Prediabetes: Secondary | ICD-10-CM

## 2018-12-26 MED ORDER — METFORMIN HCL 500 MG PO TABS: 500.0000 mg | ORAL_TABLET | Freq: Two times a day (BID) | ORAL | 0 refills | Status: DC

## 2018-12-27 NOTE — Progress Notes (Signed)
Office: [phone]  /  Fax: [phone] TeleHealth Visit:  [name] has verbally consented to this TeleHealth visit today. The patient is located at home, the provider is located at the [facility] office. The participants in this visit include the listed provider and patient. The visit was conducted today via Face Time.  HPI:   Chief Complaint: OBESITY [name] is here to discuss her progress with her obesity treatment plan. She is on the Category 3 plan and is following her eating plan approximately 0 % of the time. She states she is exercising 0 minutes 0 times per week. [name] reports that she is skipping lunch and breakfast. She is not hungry until evening, when she eats anything that she wants for dinner.  We were unable to weigh the patient today for this TeleHealth visit. She feels as if she has gained weight since her last visit. She has lost 23 lbs since starting treatment with [place].  Pre-Diabetes [name] has a diagnosis of pre-diabetes based on her elevated Hgb A1c and was informed this puts her at greater risk of developing diabetes. She continues to work on diet and exercise to decrease risk of diabetes. She denies nausea, vomiting, or diarrhea while taking metformin. She also denies polyphagia or hypoglycemia.   Hyperlipidemia [name] has hyperlipidemia and has been trying to improve her cholesterol levels with intensive lifestyle modification including a low saturated fat diet, exercise, and weight loss. She is not taking any medications and denies any chest pain.  ASSESSMENT AND PLAN:  Prediabetes - Plan: metFORMIN (GLUCOPHAGE) 500 MG tablet  Other hyperlipidemia  Class 3 severe obesity with serious comorbidity and body mass index (BMI) of 50.0 to 59.9 in adult, unspecified obesity type [facility])  PLAN:  Pre-Diabetes [name] will continue to work on weight loss, exercise, and decreasing simple carbohydrates in her diet to help decrease the risk of diabetes. She  was informed that eating too many simple carbohydrates or too many calories at one sitting increases the likelihood of GI side effects. [name] agreed to continue metformin 500 mg BID #60 with no refills and a prescription was written today. [name] agreed to follow up with [place] as directed to monitor her progress in 3 weeks.   Hyperlipidemia [name] was informed of the American Heart Association Guidelines emphasizing intensive lifestyle modifications as the first line treatment for hyperlipidemia. We discussed many lifestyle modifications today in depth, and [name] will continue to work on decreasing saturated fats such as fatty red meat, butter and many fried foods. She will also increase vegetables and lean protein in her diet and continue to work on exercise and weight loss efforts. [name] will follow up at the agreed upon time.  Obesity [name] is currently in the action stage of change. As such, her goal is to continue with weight loss efforts. She has agreed to follow the Category 3 plan. [name] has been instructed to work up to a goal of 150 minutes of combined cardio and strengthening exercise per week for weight loss and overall health benefits. We discussed the following Behavioral Modification Strategies today: work on meal planning and easy cooking plans and planning for success.  [name] has agreed to follow up with our clinic in 3 weeks. She was informed of the importance of frequent follow up visits to maximize her success with intensive lifestyle modifications for her multiple health conditions.  ALLERGIES: Allergies  Allergen Reactions  . Erythromycin   . Zyrtec [Cetirizine Hcl]  Only allergic to Zyrtec D -     MEDICATIONS: Current Outpatient Medications on File Prior to Visit  Medication Sig Dispense Refill  . acetaminophen (TYLENOL) 325 MG tablet Take 650 mg by mouth every 6 (six) hours as needed.    [name] Kitchen albuterol (PROVENTIL HFA;VENTOLIN HFA) 108 (90 Base) MCG/ACT inhaler Inhale 2  puffs into the lungs every 6 (six) hours as needed for wheezing or shortness of breath. 1 Inhaler 0  . albuterol (PROVENTIL) (2.5 MG/3ML) 0.083% nebulizer solution Take 3 mLs (2.5 mg total) by nebulization every 6 (six) hours as needed for wheezing or shortness of breath. 150 mL 1  . ARMOUR THYROID 120 MG tablet TAKE 1 TABLET BY MOUTH ONCE A DAY BEFOREBREAKFAST 90 tablet 0  . Azelastine-Fluticasone 137-50 MCG/ACT SUSP Place 1 spray into the nose daily.    . budesonide-formoterol (SYMBICORT) 80-4.5 MCG/ACT inhaler Inhale 2 puffs into the lungs 2 (two) times daily. 1 Inhaler 3  . citalopram (CELEXA) 20 MG tablet Take 20 mg by mouth daily.      . clonazePAM (KLONOPIN) 0.5 MG tablet Take 0.5 mg by mouth 3 (three) times daily as needed for anxiety.    . diclofenac (VOLTAREN) 75 MG EC tablet Take 1 tablet (75 mg total) by mouth daily as needed for mild pain or moderate pain. 90 tablet 0  . fexofenadine (ALLEGRA) 180 MG tablet TAKE 1 TABLET BY MOUTH ONCE DAILY 90 tablet 1  . gabapentin (NEURONTIN) 100 MG capsule Take 100 mg by mouth 3 (three) times daily.    [name] Kitchen guaiFENesin (MUCINEX) 600 MG 12 hr tablet Take 1 tablet (600 mg total) by mouth 2 (two) times daily. 20 tablet 0  . ibuprofen (ADVIL,MOTRIN) 200 MG tablet Take 200 mg by mouth every 6 (six) hours as needed.    . montelukast (SINGULAIR) 10 MG tablet TAKE 1 TABLET BY MOUTH EVERY NIGHT AT BEDTIME 90 tablet 0  . omeprazole (PRILOSEC) 20 MG capsule Take 20 mg by mouth daily.    [name] Kitchen VITAMIN D, CHOLECALCIFEROL, PO Take 50 mcg by mouth 2 (two) times daily.    [name] Kitchen zolpidem (AMBIEN) 5 MG tablet Take 10 mg by mouth at bedtime as needed.      No current facility-administered medications on file prior to visit.     PAST MEDICAL HISTORY: Past Medical History:  Diagnosis Date  . Asthma   . Chronic fatigue   . Chronic pain    back and hips  . Fibromyalgia   . High cholesterol   . Insomnia   . Prediabetes   . Scoliosis   . Sleep apnea     PAST SURGICAL  HISTORY: Past Surgical History:  Procedure Laterality Date  . ABDOMINAL HYSTERECTOMY    . CESAREAN SECTION    . CHOLECYSTECTOMY    . SPINAL FUSION    . THYROIDECTOMY    . TONSILLECTOMY    . TUBAL LIGATION      SOCIAL HISTORY: Social History   Tobacco Use  . Smoking status: Former Smoker    Packs/day: 0.25    Years: 12.00    Pack years: 3.00    Types: Cigarettes    Last attempt to quit: 09/07/2004    Years since quitting: 14.3  . Smokeless tobacco: Never Used  Substance Use Topics  . Alcohol use: Yes    Comment: 4 times a year  . Drug use: No    FAMILY HISTORY: Family History  Problem Relation Age of Onset  . Cancer Maternal Uncle   .  Heart disease Father   . High Cholesterol Father   . Heart disease Paternal Grandfather   . Breast cancer Maternal Grandmother   . Rheum arthritis Mother   . Obesity Mother   . Rheum arthritis Maternal Grandfather   . Rheum arthritis Maternal Aunt     ROS: Review of Systems  Cardiovascular: Negative for chest pain.  Gastrointestinal: Negative for nausea and vomiting.  Endo/Heme/Allergies:       Negative for hypoglycemia. Negative for polyphagia.    PHYSICAL EXAM: Pt in no acute distress  RECENT LABS AND TESTS: BMET    Component Value Date/Time   NA 138 08/29/2018 1443   K 4.2 08/29/2018 1443   CL 100 08/29/2018 1443   CO2 24 08/29/2018 1443   GLUCOSE 100 (H) 08/29/2018 1443   GLUCOSE 90 10/01/2017 1041   BUN 13 08/29/2018 1443   CREATININE 0.63 08/29/2018 1443   CREATININE 0.53 10/01/2017 1041   CALCIUM 9.5 08/29/2018 1443   GFRNONAA 109 08/29/2018 1443   GFRNONAA 116 10/01/2017 1041   GFRAA 126 08/29/2018 1443   GFRAA 135 10/01/2017 1041   Lab Results  Component Value Date   HGBA1C 5.6 08/29/2018   HGBA1C 5.8 (H) 04/27/2018   HGBA1C 5.8 (H) 12/07/2017   HGBA1C 6.0 (H) 10/01/2017   Lab Results  Component Value Date   INSULIN 29.5 (H) 08/29/2018   INSULIN 46.1 (H) 04/27/2018   INSULIN 20.1 12/07/2017    CBC    Component Value Date/Time   WBC 5.6 04/27/2018 1144   WBC 7.0 10/01/2017 1041   RBC 4.85 04/27/2018 1144   RBC 5.02 10/01/2017 1041   HGB 13.4 04/27/2018 1144   HCT 42.9 04/27/2018 1144   PLT 244 10/01/2017 1041   MCV 89 04/27/2018 1144   MCH 27.6 04/27/2018 1144   MCH 28.3 10/01/2017 1041   MCHC 31.2 (L) 04/27/2018 1144   MCHC 34.5 10/01/2017 1041   RDW 14.1 04/27/2018 1144   LYMPHSABS 1.3 04/27/2018 1144   EOSABS 0.1 04/27/2018 1144   BASOSABS 0.0 04/27/2018 1144   Iron/TIBC/Ferritin/ %Sat No results found for: IRON, TIBC, FERRITIN, IRONPCTSAT Lipid Panel     Component Value Date/Time   CHOL 151 08/29/2018 1443   TRIG 196 (H) 08/29/2018 1443   HDL 29 (L) 08/29/2018 1443   CHOLHDL 2.9 10/08/2017 1103   LDLCALC 83 08/29/2018 1443   LDLCALC 69 10/08/2017 1103   Hepatic Function Panel     Component Value Date/Time   PROT 6.9 08/29/2018 1443   ALBUMIN 4.3 08/29/2018 1443   AST 24 08/29/2018 1443   ALT 28 08/29/2018 1443   ALKPHOS 64 08/29/2018 1443   BILITOT 0.3 08/29/2018 1443      Component Value Date/Time   TSH 1.390 08/29/2018 1443   TSH 0.377 (L) 04/27/2018 1144   TSH 1.890 02/15/2018 1137   Results for Trumbo, Jack S "Claudeen Meiner" (MRN [id number]) as of 12/27/2018 08:46  Ref. Range 08/29/2018 14:43  Vitamin D, 25-Hydroxy Latest Ref Range: 30.0 - 100.0 ng/mL 46.1    I, Marcille Blanco, CMA, am acting as Location manager for Abby Potash, PA-C I, Abby Potash, PA-C have reviewed above note and agree with its content

## 2019-01-16 ENCOUNTER — Ambulatory Visit (INDEPENDENT_AMBULATORY_CARE_PROVIDER_SITE_OTHER): Payer: Medicaid Other | Admitting: Family Medicine

## 2019-01-16 ENCOUNTER — Other Ambulatory Visit: Payer: Self-pay

## 2019-01-16 ENCOUNTER — Encounter (INDEPENDENT_AMBULATORY_CARE_PROVIDER_SITE_OTHER): Payer: Self-pay | Admitting: Family Medicine

## 2019-01-16 DIAGNOSIS — Z6841 Body Mass Index (BMI) 40.0 and over, adult: Secondary | ICD-10-CM | POA: Diagnosis not present

## 2019-01-16 DIAGNOSIS — F418 Other specified anxiety disorders: Secondary | ICD-10-CM | POA: Diagnosis not present

## 2019-01-17 NOTE — Progress Notes (Signed)
Office: [phone]  /  Fax: [phone] TeleHealth Visit:  [name] has verbally consented to this TeleHealth visit today. The patient is located at home, the provider is located at the [employer] [facility] office. The participants in this visit include the listed provider and patient. The visit was conducted today via Face Time.  HPI:   Chief Complaint: OBESITY [name] is here to discuss her progress with her obesity treatment plan. She is on the Category 3 plan and is following her eating plan approximately 20 % of the time. She states she is exercising 0 minutes 0 times per week. [name] continues to struggle with following her plan and feels that she is continuing to gain weight over the last 2 to 3 weeks. She had done well prior to [place], but has struggled a lot more with the meal plan and using food for comfort. [name] has questions about meat shortages.  We were unable to weigh the patient today for this TeleHealth visit. She feels as if she has probably gained weight since her last visit. She has lost 23 lbs since starting treatment with [place].  Depression with Anxiety [name] feels that her mood has worsened in the last month or so. Her motivation to improve her health has decreased and she isn't sleeping as well. She has been working on behavior modification techniques to help reduce her emotional eating and has been somewhat successful.   Depression screen [facility] 2/9 12/01/2018 09/30/2018 06/09/2018 05/13/2018 05/11/2018  Decreased Interest 1 1 3 2 3   Down, Depressed, Hopeless 1 2 2 1 2   PHQ - 2 Score 2 3 5 3 5   Altered sleeping 2 3 3 1 3   Tired, decreased energy 2 3 3 1 3   Change in appetite 1 0 0 1 2  Feeling bad or failure about yourself  1 2 0 1 3  Trouble concentrating 0 2 2 1 2   Moving slowly or fidgety/restless 1 0 0 0 0  Suicidal thoughts 0 0 0 0 0  PHQ-9 Score 9 13 13 8 18   Difficult doing work/chores Somewhat difficult - Somewhat difficult Somewhat difficult -   Some recent data might be hidden     ASSESSMENT AND PLAN:  Depression with anxiety - Plan: buPROPion (WELLBUTRIN SR) 150 MG 12 hr tablet  Class 3 severe obesity with serious comorbidity and body mass index (BMI) of 50.0 to 59.9 in adult, unspecified obesity type (HCC)  PLAN:  Depression with Anxiety We discussed behavior modification techniques today to help [name] deal with her emotional eating and depression. She has agreed to continue to take Celexa and to start Wellbutrin SR 150 mg qAM #30 with no refills and agreed to follow up as directed in 2 weeks.  I spent > than 50% of the 25 minute visit on counseling as documented in the note.  Obesity [name] is currently in the action stage of change. As such, her goal is to continue with weight loss efforts. She has agreed to follow the Category 3 plan. [name] has been instructed to work up to a goal of 150 minutes of combined cardio and strengthening exercise per week for weight loss and overall health benefits. We discussed the following Behavioral Modification Strategies today: work on meal planning and easy cooking plans, better snacking choices, and emotional eating strategies. We discussed that 50 calories and 5 grams of protein is equal to 1 ounce of lean meat.  [name] has agreed to follow up with our  clinic in 2 weeks. She was informed of the importance of frequent follow up visits to maximize her success with intensive lifestyle modifications for her multiple health conditions.  ALLERGIES: Allergies  Allergen Reactions  . Erythromycin   . Zyrtec [Cetirizine Hcl]     Only allergic to Zyrtec D -     MEDICATIONS: Current Outpatient Medications on File Prior to Visit  Medication Sig Dispense Refill  . acetaminophen (TYLENOL) 325 MG tablet Take 650 mg by mouth every 6 (six) hours as needed.    [name] Kitchen albuterol (PROVENTIL HFA;VENTOLIN HFA) 108 (90 Base) MCG/ACT inhaler Inhale 2 puffs into the lungs every 6 (six) hours as needed for  wheezing or shortness of breath. 1 Inhaler 0  . albuterol (PROVENTIL) (2.5 MG/3ML) 0.083% nebulizer solution Take 3 mLs (2.5 mg total) by nebulization every 6 (six) hours as needed for wheezing or shortness of breath. 150 mL 1  . ARMOUR THYROID 120 MG tablet TAKE 1 TABLET BY MOUTH ONCE A DAY BEFOREBREAKFAST 90 tablet 0  . Azelastine-Fluticasone 137-50 MCG/ACT SUSP Place 1 spray into the nose daily.    . budesonide-formoterol (SYMBICORT) 80-4.5 MCG/ACT inhaler Inhale 2 puffs into the lungs 2 (two) times daily. 1 Inhaler 3  . citalopram (CELEXA) 20 MG tablet Take 20 mg by mouth daily.      . clonazePAM (KLONOPIN) 0.5 MG tablet Take 0.5 mg by mouth 3 (three) times daily as needed for anxiety.    . diclofenac (VOLTAREN) 75 MG EC tablet Take 1 tablet (75 mg total) by mouth daily as needed for mild pain or moderate pain. 90 tablet 0  . fexofenadine (ALLEGRA) 180 MG tablet TAKE 1 TABLET BY MOUTH ONCE DAILY 90 tablet 1  . gabapentin (NEURONTIN) 100 MG capsule Take 100 mg by mouth 3 (three) times daily.    [name] Kitchen guaiFENesin (MUCINEX) 600 MG 12 hr tablet Take 1 tablet (600 mg total) by mouth 2 (two) times daily. 20 tablet 0  . ibuprofen (ADVIL,MOTRIN) 200 MG tablet Take 200 mg by mouth every 6 (six) hours as needed.    . metFORMIN (GLUCOPHAGE) 500 MG tablet Take 1 tablet (500 mg total) by mouth 2 (two) times daily with a meal. 60 tablet 0  . montelukast (SINGULAIR) 10 MG tablet TAKE 1 TABLET BY MOUTH EVERY NIGHT AT BEDTIME 90 tablet 0  . omeprazole (PRILOSEC) 20 MG capsule Take 20 mg by mouth daily.    [name] Kitchen VITAMIN D, CHOLECALCIFEROL, PO Take 50 mcg by mouth 2 (two) times daily.    [name] Kitchen zolpidem (AMBIEN) 5 MG tablet Take 10 mg by mouth at bedtime as needed.      No current facility-administered medications on file prior to visit.     PAST MEDICAL HISTORY: Past Medical History:  Diagnosis Date  . Asthma   . Chronic fatigue   . Chronic pain    back and hips  . Fibromyalgia   . High cholesterol   . Insomnia    . Prediabetes   . Scoliosis   . Sleep apnea     PAST SURGICAL HISTORY: Past Surgical History:  Procedure Laterality Date  . ABDOMINAL HYSTERECTOMY    . CESAREAN SECTION    . CHOLECYSTECTOMY    . SPINAL FUSION    . THYROIDECTOMY    . TONSILLECTOMY    . TUBAL LIGATION      SOCIAL HISTORY: Social History   Tobacco Use  . Smoking status: Former Smoker    Packs/day: 0.25    Years: 12.00  Pack years: 3.00    Types: Cigarettes    Last attempt to quit: 09/07/2004    Years since quitting: 14.3  . Smokeless tobacco: Never Used  Substance Use Topics  . Alcohol use: Yes    Comment: 4 times a year  . Drug use: No    FAMILY HISTORY: Family History  Problem Relation Age of Onset  . Cancer Maternal Uncle   . Heart disease Father   . High Cholesterol Father   . Heart disease Paternal Grandfather   . Breast cancer Maternal Grandmother   . Rheum arthritis Mother   . Obesity Mother   . Rheum arthritis Maternal Grandfather   . Rheum arthritis Maternal Aunt     ROS: Review of Systems  Psychiatric/Behavioral: Positive for depression. The patient is nervous/anxious.     PHYSICAL EXAM: Pt in no acute distress  RECENT LABS AND TESTS: BMET    Component Value Date/Time   NA 138 08/29/2018 1443   K 4.2 08/29/2018 1443   CL 100 08/29/2018 1443   CO2 24 08/29/2018 1443   GLUCOSE 100 (H) 08/29/2018 1443   GLUCOSE 90 10/01/2017 1041   BUN 13 08/29/2018 1443   CREATININE 0.63 08/29/2018 1443   CREATININE 0.53 10/01/2017 1041   CALCIUM 9.5 08/29/2018 1443   GFRNONAA 109 08/29/2018 1443   GFRNONAA 116 10/01/2017 1041   GFRAA 126 08/29/2018 1443   GFRAA 135 10/01/2017 1041   Lab Results  Component Value Date   HGBA1C 5.6 08/29/2018   HGBA1C 5.8 (H) 04/27/2018   HGBA1C 5.8 (H) 12/07/2017   HGBA1C 6.0 (H) 10/01/2017   Lab Results  Component Value Date   INSULIN 29.5 (H) 08/29/2018   INSULIN 46.1 (H) 04/27/2018   INSULIN 20.1 12/07/2017   CBC    Component Value  Date/Time   WBC 5.6 04/27/2018 1144   WBC 7.0 10/01/2017 1041   RBC 4.85 04/27/2018 1144   RBC 5.02 10/01/2017 1041   HGB 13.4 04/27/2018 1144   HCT 42.9 04/27/2018 1144   PLT 244 10/01/2017 1041   MCV 89 04/27/2018 1144   MCH 27.6 04/27/2018 1144   MCH 28.3 10/01/2017 1041   MCHC 31.2 (L) 04/27/2018 1144   MCHC 34.5 10/01/2017 1041   RDW 14.1 04/27/2018 1144   LYMPHSABS 1.3 04/27/2018 1144   EOSABS 0.1 04/27/2018 1144   BASOSABS 0.0 04/27/2018 1144   Iron/TIBC/Ferritin/ %Sat No results found for: IRON, TIBC, FERRITIN, IRONPCTSAT Lipid Panel     Component Value Date/Time   CHOL 151 08/29/2018 1443   TRIG 196 (H) 08/29/2018 1443   HDL 29 (L) 08/29/2018 1443   CHOLHDL 2.9 10/08/2017 1103   LDLCALC 83 08/29/2018 1443   LDLCALC 69 10/08/2017 1103   Hepatic Function Panel     Component Value Date/Time   PROT 6.9 08/29/2018 1443   ALBUMIN 4.3 08/29/2018 1443   AST 24 08/29/2018 1443   ALT 28 08/29/2018 1443   ALKPHOS 64 08/29/2018 1443   BILITOT 0.3 08/29/2018 1443      Component Value Date/Time   TSH 1.390 08/29/2018 1443   TSH 0.377 (L) 04/27/2018 1144   TSH 1.890 02/15/2018 1137    Results for Lac, Hagar S "Aniyla Haq" (MRN [id number]) as of 01/17/2019 07:59  Ref. Range 08/29/2018 14:43  Vitamin D, 25-Hydroxy Latest Ref Range: 30.0 - 100.0 ng/mL 46.1    I, Marcille Blanco, CMA, am acting as transcriptionist for Starlyn Skeans, MD I have reviewed the above documentation for accuracy and  completeness, and I agree with the above. -Dennard Nip, MD

## 2019-01-18 MED ORDER — BUPROPION HCL ER (SR) 150 MG PO TB12: 150.0000 mg | ORAL_TABLET | Freq: Every day | ORAL | 0 refills | Status: DC

## 2019-02-01 ENCOUNTER — Other Ambulatory Visit: Payer: Self-pay

## 2019-02-01 ENCOUNTER — Encounter (INDEPENDENT_AMBULATORY_CARE_PROVIDER_SITE_OTHER): Payer: Self-pay | Admitting: Family Medicine

## 2019-02-01 ENCOUNTER — Ambulatory Visit (INDEPENDENT_AMBULATORY_CARE_PROVIDER_SITE_OTHER): Payer: Medicaid Other | Admitting: Family Medicine

## 2019-02-01 DIAGNOSIS — Z6841 Body Mass Index (BMI) 40.0 and over, adult: Secondary | ICD-10-CM | POA: Diagnosis not present

## 2019-02-01 DIAGNOSIS — R7303 Prediabetes: Secondary | ICD-10-CM

## 2019-02-01 MED ORDER — METFORMIN HCL 500 MG PO TABS: 500.0000 mg | ORAL_TABLET | Freq: Two times a day (BID) | ORAL | 0 refills | Status: DC

## 2019-02-01 NOTE — Progress Notes (Signed)
Office: [phone]  /  Fax: [phone] TeleHealth Visit:  [name] has verbally consented to this TeleHealth visit today. The patient is located at home, the provider is located at the [employer] [facility] office. The participants in this visit include the listed provider and patient. [name] was unable to use realtime audiovisual technology today and the telehealth visit was conducted via telephone.  HPI:   Chief Complaint: OBESITY [name] is here to discuss her progress with her obesity treatment plan. She is on the Category 3 plan and is following her eating plan approximately 30 % of the time. She states she is exercising 0 minutes 0 times per week. [name] feels that she has maintained her weight, but is struggling with motivation due to increased stress. She is frustrated with herself that she is not following her plan closely, but she is still mindful of her food choices.  We were unable to weigh the patient today for this TeleHealth visit. She feels as if she has maintained weight since her last visit. She has lost 23 lbs since starting treatment with [place].  Pre-Diabetes [name] has a diagnosis of pre-diabetes based on her elevated Hgb A1c and was informed this puts her at greater risk of developing diabetes. She is stable on metformin currently and continues to work on diet and exercise to decrease risk of diabetes. She denies nausea, vomiting, or hypoglycemia.   ASSESSMENT AND PLAN:  Prediabetes - Plan: metFORMIN (GLUCOPHAGE) 500 MG tablet  Class 3 severe obesity with serious comorbidity and body mass index (BMI) of 50.0 to 59.9 in adult, unspecified obesity type [facility])  PLAN:  Pre-Diabetes [name] will continue to work on weight loss, exercise, and decreasing simple carbohydrates in her diet to help decrease the risk of diabetes. She was informed that eating too many simple carbohydrates or too many calories at one sitting increases the likelihood of GI side effects. [name]  agreed to continue metformin 500 mg BID #60 with no refills and a prescription was written today. [name] agreed to follow up with [place] as directed to monitor her progress in 2 weeks.   I spent > than 50% of the 25 minute visit on counseling as documented in the note.  Obesity [name] is currently in the action stage of change. As such, her goal is to continue with weight loss efforts. She has agreed to change to keep a food journal with 1500 to 1700 calories and 90+ grams of protein daily.  [name] has been instructed to work up to a goal of 150 minutes of combined cardio and strengthening exercise per week for weight loss and overall health benefits. We discussed the following Behavioral Modification Strategies today: emotional eating strategies, keeping healthy foods in the home, and keep a strict food journal.  [name] has agreed to follow up with our clinic in 2 weeks. She was informed of the importance of frequent follow up visits to maximize her success with intensive lifestyle modifications for her multiple health conditions.  ALLERGIES: Allergies  Allergen Reactions  . Erythromycin   . Zyrtec [Cetirizine Hcl]     Only allergic to Zyrtec D -     MEDICATIONS: Current Outpatient Medications on File Prior to Visit  Medication Sig Dispense Refill  . acetaminophen (TYLENOL) 325 MG tablet Take 650 mg by mouth every 6 (six) hours as needed.    [name] Kitchen albuterol (PROVENTIL HFA;VENTOLIN HFA) 108 (90 Base) MCG/ACT inhaler Inhale 2 puffs into the lungs every 6 (six) hours as needed  for wheezing or shortness of breath. 1 Inhaler 0  . albuterol (PROVENTIL) (2.5 MG/3ML) 0.083% nebulizer solution Take 3 mLs (2.5 mg total) by nebulization every 6 (six) hours as needed for wheezing or shortness of breath. 150 mL 1  . ARMOUR THYROID 120 MG tablet TAKE 1 TABLET BY MOUTH ONCE A DAY BEFOREBREAKFAST 90 tablet 0  . Azelastine-Fluticasone 137-50 MCG/ACT SUSP Place 1 spray into the nose daily.    . budesonide-formoterol  (SYMBICORT) 80-4.5 MCG/ACT inhaler Inhale 2 puffs into the lungs 2 (two) times daily. 1 Inhaler 3  . buPROPion (WELLBUTRIN SR) 150 MG 12 hr tablet Take 1 tablet (150 mg total) by mouth daily. 30 tablet 0  . citalopram (CELEXA) 20 MG tablet Take 20 mg by mouth daily.      . clonazePAM (KLONOPIN) 0.5 MG tablet Take 0.5 mg by mouth 3 (three) times daily as needed for anxiety.    . diclofenac (VOLTAREN) 75 MG EC tablet Take 1 tablet (75 mg total) by mouth daily as needed for mild pain or moderate pain. 90 tablet 0  . fexofenadine (ALLEGRA) 180 MG tablet TAKE 1 TABLET BY MOUTH ONCE DAILY 90 tablet 1  . gabapentin (NEURONTIN) 100 MG capsule Take 100 mg by mouth 3 (three) times daily.    [name] Kitchen guaiFENesin (MUCINEX) 600 MG 12 hr tablet Take 1 tablet (600 mg total) by mouth 2 (two) times daily. 20 tablet 0  . ibuprofen (ADVIL,MOTRIN) 200 MG tablet Take 200 mg by mouth every 6 (six) hours as needed.    . montelukast (SINGULAIR) 10 MG tablet TAKE 1 TABLET BY MOUTH EVERY NIGHT AT BEDTIME 90 tablet 0  . omeprazole (PRILOSEC) 20 MG capsule Take 20 mg by mouth daily.    [name] Kitchen VITAMIN D, CHOLECALCIFEROL, PO Take 50 mcg by mouth 2 (two) times daily.    [name] Kitchen zolpidem (AMBIEN) 5 MG tablet Take 10 mg by mouth at bedtime as needed.      No current facility-administered medications on file prior to visit.     PAST MEDICAL HISTORY: Past Medical History:  Diagnosis Date  . Asthma   . Chronic fatigue   . Chronic pain    back and hips  . Fibromyalgia   . High cholesterol   . Insomnia   . Prediabetes   . Scoliosis   . Sleep apnea     PAST SURGICAL HISTORY: Past Surgical History:  Procedure Laterality Date  . ABDOMINAL HYSTERECTOMY    . CESAREAN SECTION    . CHOLECYSTECTOMY    . SPINAL FUSION    . THYROIDECTOMY    . TONSILLECTOMY    . TUBAL LIGATION      SOCIAL HISTORY: Social History   Tobacco Use  . Smoking status: Former Smoker    Packs/day: 0.25    Years: 12.00    Pack years: 3.00    Types:  Cigarettes    Last attempt to quit: 09/07/2004    Years since quitting: 14.4  . Smokeless tobacco: Never Used  Substance Use Topics  . Alcohol use: Yes    Comment: 4 times a year  . Drug use: No    FAMILY HISTORY: Family History  Problem Relation Age of Onset  . Cancer Maternal Uncle   . Heart disease Father   . High Cholesterol Father   . Heart disease Paternal Grandfather   . Breast cancer Maternal Grandmother   . Rheum arthritis Mother   . Obesity Mother   . Rheum arthritis Maternal Grandfather   .  Rheum arthritis Maternal Aunt     ROS: Review of Systems  Gastrointestinal: Negative for nausea and vomiting.  Endo/Heme/Allergies:       Negative for hypoglycemia.    PHYSICAL EXAM: Pt in no acute distress  RECENT LABS AND TESTS: BMET    Component Value Date/Time   NA 138 08/29/2018 1443   K 4.2 08/29/2018 1443   CL 100 08/29/2018 1443   CO2 24 08/29/2018 1443   GLUCOSE 100 (H) 08/29/2018 1443   GLUCOSE 90 10/01/2017 1041   BUN 13 08/29/2018 1443   CREATININE 0.63 08/29/2018 1443   CREATININE 0.53 10/01/2017 1041   CALCIUM 9.5 08/29/2018 1443   GFRNONAA 109 08/29/2018 1443   GFRNONAA 116 10/01/2017 1041   GFRAA 126 08/29/2018 1443   GFRAA 135 10/01/2017 1041   Lab Results  Component Value Date   HGBA1C 5.6 08/29/2018   HGBA1C 5.8 (H) 04/27/2018   HGBA1C 5.8 (H) 12/07/2017   HGBA1C 6.0 (H) 10/01/2017   Lab Results  Component Value Date   INSULIN 29.5 (H) 08/29/2018   INSULIN 46.1 (H) 04/27/2018   INSULIN 20.1 12/07/2017   CBC    Component Value Date/Time   WBC 5.6 04/27/2018 1144   WBC 7.0 10/01/2017 1041   RBC 4.85 04/27/2018 1144   RBC 5.02 10/01/2017 1041   HGB 13.4 04/27/2018 1144   HCT 42.9 04/27/2018 1144   PLT 244 10/01/2017 1041   MCV 89 04/27/2018 1144   MCH 27.6 04/27/2018 1144   MCH 28.3 10/01/2017 1041   MCHC 31.2 (L) 04/27/2018 1144   MCHC 34.5 10/01/2017 1041   RDW 14.1 04/27/2018 1144   LYMPHSABS 1.3 04/27/2018 1144   EOSABS  0.1 04/27/2018 1144   BASOSABS 0.0 04/27/2018 1144   Iron/TIBC/Ferritin/ %Sat No results found for: IRON, TIBC, FERRITIN, IRONPCTSAT Lipid Panel     Component Value Date/Time   CHOL 151 08/29/2018 1443   TRIG 196 (H) 08/29/2018 1443   HDL 29 (L) 08/29/2018 1443   CHOLHDL 2.9 10/08/2017 1103   LDLCALC 83 08/29/2018 1443   LDLCALC 69 10/08/2017 1103   Hepatic Function Panel     Component Value Date/Time   PROT 6.9 08/29/2018 1443   ALBUMIN 4.3 08/29/2018 1443   AST 24 08/29/2018 1443   ALT 28 08/29/2018 1443   ALKPHOS 64 08/29/2018 1443   BILITOT 0.3 08/29/2018 1443      Component Value Date/Time   TSH 1.390 08/29/2018 1443   TSH 0.377 (L) 04/27/2018 1144   TSH 1.890 02/15/2018 1137   Results for [place], Norvell S "Varsha Decamp" (MRN [id number]) as of 02/01/2019 14:52  Ref. Range 08/29/2018 14:43  Vitamin D, 25-Hydroxy Latest Ref Range: 30.0 - 100.0 ng/mL 46.1    I, Marcille Blanco, CMA, am acting as transcriptionist for Starlyn Skeans, MD I have reviewed the above documentation for accuracy and completeness, and I agree with the above. -Dennard Nip, MD

## 2019-02-14 ENCOUNTER — Encounter (INDEPENDENT_AMBULATORY_CARE_PROVIDER_SITE_OTHER): Payer: Self-pay | Admitting: Family Medicine

## 2019-02-14 ENCOUNTER — Ambulatory Visit (INDEPENDENT_AMBULATORY_CARE_PROVIDER_SITE_OTHER): Payer: Medicaid Other | Admitting: Family Medicine

## 2019-02-14 ENCOUNTER — Other Ambulatory Visit: Payer: Self-pay

## 2019-02-14 DIAGNOSIS — F3289 Other specified depressive episodes: Secondary | ICD-10-CM

## 2019-02-14 DIAGNOSIS — Z6841 Body Mass Index (BMI) 40.0 and over, adult: Secondary | ICD-10-CM | POA: Diagnosis not present

## 2019-02-15 ENCOUNTER — Other Ambulatory Visit: Payer: Self-pay | Admitting: Family Medicine

## 2019-02-15 DIAGNOSIS — E038 Other specified hypothyroidism: Secondary | ICD-10-CM

## 2019-02-15 NOTE — Progress Notes (Signed)
Office: [phone]  /  Fax: [phone] TeleHealth Visit:  [name] has verbally consented to this TeleHealth visit today. The patient is located at home, the provider is located at the [employer] [facility] office. The participants in this visit include the listed provider and patient. The visit was conducted today via Webex.  HPI:   Chief Complaint: OBESITY [name] is here to discuss her progress with her obesity treatment plan. She is on the  keep a food journal with 1500-1700 calories and 90+g of protein daily and is following her eating plan approximately 30 % of the time. She states she is exercising 0 minutes 0 times per week. [name] continues to maintain her weight by journaling off and on. She is struggling to meet her protein goals but feels this is a better for her than something more structure.   We were unable to weigh the patient today for this TeleHealth visit. She feels as if she has maintained weight since her last visit. She has lost 23 lbs since starting treatment with [place].  Depression with emotional eating behaviors [name] is struggling with emotional eating and using food for comfort to the extent that it is negatively impacting her health. She often snacks when she is not hungry. [name] sometimes feels she is out of control and then feels guilty that she made poor food choices. She has been working on behavior modification techniques to help reduce her emotional eating and has been somewhat successful. She started Wellbutrin but forgets to take it in the AM approximately 50% of the time so she is not getting much benefit at this time. She is still struggling with cravings.  She shows no sign of suicidal or homicidal ideations.  Depression screen [facility] 2/9 12/01/2018 09/30/2018 06/09/2018 05/13/2018 05/11/2018  Decreased Interest 1 1 3 2 3   Down, Depressed, Hopeless 1 2 2 1 2   PHQ - 2 Score 2 3 5 3 5   Altered sleeping 2 3 3 1 3   Tired, decreased energy 2 3 3 1 3   Change in  appetite 1 0 0 1 2  Feeling bad or failure about yourself  1 2 0 1 3  Trouble concentrating 0 2 2 1 2   Moving slowly or fidgety/restless 1 0 0 0 0  Suicidal thoughts 0 0 0 0 0  PHQ-9 Score 9 13 13 8 18   Difficult doing work/chores Somewhat difficult - Somewhat difficult Somewhat difficult -  Some recent data might be hidden    ASSESSMENT AND PLAN:  Other depression - with emotional eating  Class 3 severe obesity with serious comorbidity and body mass index (BMI) of 50.0 to 59.9 in adult, unspecified obesity type (HCC)  PLAN: Depression with Emotional Eating Behaviors We discussed behavior modification techniques today to help [name] deal with her emotional eating and depression. She has agreed to take Wellbutrin SR 150 mg qd. She is okay to take Wellbutrin one time a day whenever she remembers until we can get her back on a routine. She agreed to follow up as directed.  I spent > than 50% of the 15 minute visit on counseling as documented in the note.  Obesity [name] is currently in the action stage of change. As such, her goal is to continue with weight loss efforts She has agreed to keep a food journal with 1500-1700 calories and 90+g of protein daily. [name] has been instructed to work up to a goal of 150 minutes of combined cardio and strengthening exercise  per week for weight loss and overall health benefits. We discussed the following Behavioral Modification Stratagies today: increasing lean protein intake, decreasing simple carbohydrates  and work on meal planning and easy cooking plans   Barri has agreed to follow up with our clinic in 2 weeks. She was informed of the importance of frequent follow up visits to maximize her success with intensive lifestyle modifications for her multiple health conditions.  ALLERGIES: Allergies  Allergen Reactions  . Erythromycin   . Zyrtec [Cetirizine Hcl]     Only allergic to Zyrtec D -     MEDICATIONS: Current Outpatient Medications on  File Prior to Visit  Medication Sig Dispense Refill  . acetaminophen (TYLENOL) 325 MG tablet Take 650 mg by mouth every 6 (six) hours as needed.    [name] Kitchen albuterol (PROVENTIL HFA;VENTOLIN HFA) 108 (90 Base) MCG/ACT inhaler Inhale 2 puffs into the lungs every 6 (six) hours as needed for wheezing or shortness of breath. 1 Inhaler 0  . albuterol (PROVENTIL) (2.5 MG/3ML) 0.083% nebulizer solution Take 3 mLs (2.5 mg total) by nebulization every 6 (six) hours as needed for wheezing or shortness of breath. 150 mL 1  . ARMOUR THYROID 120 MG tablet TAKE 1 TABLET BY MOUTH ONCE A DAY BEFOREBREAKFAST 90 tablet 0  . Azelastine-Fluticasone 137-50 MCG/ACT SUSP Place 1 spray into the nose daily.    . budesonide-formoterol (SYMBICORT) 80-4.5 MCG/ACT inhaler Inhale 2 puffs into the lungs 2 (two) times daily. 1 Inhaler 3  . buPROPion (WELLBUTRIN SR) 150 MG 12 hr tablet Take 1 tablet (150 mg total) by mouth daily. 30 tablet 0  . citalopram (CELEXA) 20 MG tablet Take 20 mg by mouth daily.      . clonazePAM (KLONOPIN) 0.5 MG tablet Take 0.5 mg by mouth 3 (three) times daily as needed for anxiety.    . diclofenac (VOLTAREN) 75 MG EC tablet Take 1 tablet (75 mg total) by mouth daily as needed for mild pain or moderate pain. 90 tablet 0  . fexofenadine (ALLEGRA) 180 MG tablet TAKE 1 TABLET BY MOUTH ONCE DAILY 90 tablet 1  . gabapentin (NEURONTIN) 100 MG capsule Take 100 mg by mouth 3 (three) times daily.    [name] Kitchen guaiFENesin (MUCINEX) 600 MG 12 hr tablet Take 1 tablet (600 mg total) by mouth 2 (two) times daily. 20 tablet 0  . ibuprofen (ADVIL,MOTRIN) 200 MG tablet Take 200 mg by mouth every 6 (six) hours as needed.    . metFORMIN (GLUCOPHAGE) 500 MG tablet Take 1 tablet (500 mg total) by mouth 2 (two) times daily with a meal. 60 tablet 0  . montelukast (SINGULAIR) 10 MG tablet TAKE 1 TABLET BY MOUTH EVERY NIGHT AT BEDTIME 90 tablet 0  . omeprazole (PRILOSEC) 20 MG capsule Take 20 mg by mouth daily.    [name] Kitchen VITAMIN D,  CHOLECALCIFEROL, PO Take 50 mcg by mouth 2 (two) times daily.    [name] Kitchen zolpidem (AMBIEN) 5 MG tablet Take 10 mg by mouth at bedtime as needed.      No current facility-administered medications on file prior to visit.     PAST MEDICAL HISTORY: Past Medical History:  Diagnosis Date  . Asthma   . Chronic fatigue   . Chronic pain    back and hips  . Fibromyalgia   . High cholesterol   . Insomnia   . Prediabetes   . Scoliosis   . Sleep apnea     PAST SURGICAL HISTORY: Past Surgical History:  Procedure Laterality Date  .  ABDOMINAL HYSTERECTOMY    . CESAREAN SECTION    . CHOLECYSTECTOMY    . SPINAL FUSION    . THYROIDECTOMY    . TONSILLECTOMY    . TUBAL LIGATION      SOCIAL HISTORY: Social History   Tobacco Use  . Smoking status: Former Smoker    Packs/day: 0.25    Years: 12.00    Pack years: 3.00    Types: Cigarettes    Last attempt to quit: 09/07/2004    Years since quitting: 14.4  . Smokeless tobacco: Never Used  Substance Use Topics  . Alcohol use: Yes    Comment: 4 times a year  . Drug use: No    FAMILY HISTORY: Family History  Problem Relation Age of Onset  . Cancer Maternal Uncle   . Heart disease Father   . High Cholesterol Father   . Heart disease Paternal Grandfather   . Breast cancer Maternal Grandmother   . Rheum arthritis Mother   . Obesity Mother   . Rheum arthritis Maternal Grandfather   . Rheum arthritis Maternal Aunt     ROS: Review of Systems  Psychiatric/Behavioral: Positive for depression. Negative for suicidal ideas.    PHYSICAL EXAM: Pt in no acute distress  RECENT LABS AND TESTS: BMET    Component Value Date/Time   NA 138 08/29/2018 1443   K 4.2 08/29/2018 1443   CL 100 08/29/2018 1443   CO2 24 08/29/2018 1443   GLUCOSE 100 (H) 08/29/2018 1443   GLUCOSE 90 10/01/2017 1041   BUN 13 08/29/2018 1443   CREATININE 0.63 08/29/2018 1443   CREATININE 0.53 10/01/2017 1041   CALCIUM 9.5 08/29/2018 1443   GFRNONAA 109 08/29/2018  1443   GFRNONAA 116 10/01/2017 1041   GFRAA 126 08/29/2018 1443   GFRAA 135 10/01/2017 1041   Lab Results  Component Value Date   HGBA1C 5.6 08/29/2018   HGBA1C 5.8 (H) 04/27/2018   HGBA1C 5.8 (H) 12/07/2017   HGBA1C 6.0 (H) 10/01/2017   Lab Results  Component Value Date   INSULIN 29.5 (H) 08/29/2018   INSULIN 46.1 (H) 04/27/2018   INSULIN 20.1 12/07/2017   CBC    Component Value Date/Time   WBC 5.6 04/27/2018 1144   WBC 7.0 10/01/2017 1041   RBC 4.85 04/27/2018 1144   RBC 5.02 10/01/2017 1041   HGB 13.4 04/27/2018 1144   HCT 42.9 04/27/2018 1144   PLT 244 10/01/2017 1041   MCV 89 04/27/2018 1144   MCH 27.6 04/27/2018 1144   MCH 28.3 10/01/2017 1041   MCHC 31.2 (L) 04/27/2018 1144   MCHC 34.5 10/01/2017 1041   RDW 14.1 04/27/2018 1144   LYMPHSABS 1.3 04/27/2018 1144   EOSABS 0.1 04/27/2018 1144   BASOSABS 0.0 04/27/2018 1144   Iron/TIBC/Ferritin/ %Sat No results found for: IRON, TIBC, FERRITIN, IRONPCTSAT Lipid Panel     Component Value Date/Time   CHOL 151 08/29/2018 1443   TRIG 196 (H) 08/29/2018 1443   HDL 29 (L) 08/29/2018 1443   CHOLHDL 2.9 10/08/2017 1103   LDLCALC 83 08/29/2018 1443   LDLCALC 69 10/08/2017 1103   Hepatic Function Panel     Component Value Date/Time   PROT 6.9 08/29/2018 1443   ALBUMIN 4.3 08/29/2018 1443   AST 24 08/29/2018 1443   ALT 28 08/29/2018 1443   ALKPHOS 64 08/29/2018 1443   BILITOT 0.3 08/29/2018 1443      Component Value Date/Time   TSH 1.390 08/29/2018 1443   TSH 0.377 (L)  04/27/2018 1144   TSH 1.890 02/15/2018 1137      I, Renee Ramus, am acting as Location manager for Dennard Nip, MD  I have reviewed the above documentation for accuracy and completeness, and I agree with the above. -Dennard Nip, MD

## 2019-02-17 ENCOUNTER — Other Ambulatory Visit (INDEPENDENT_AMBULATORY_CARE_PROVIDER_SITE_OTHER): Payer: Self-pay | Admitting: Family Medicine

## 2019-02-17 DIAGNOSIS — F418 Other specified anxiety disorders: Secondary | ICD-10-CM

## 2019-02-21 DIAGNOSIS — F329 Major depressive disorder, single episode, unspecified: Secondary | ICD-10-CM | POA: Insufficient documentation

## 2019-02-21 DIAGNOSIS — F32A Depression, unspecified: Secondary | ICD-10-CM | POA: Insufficient documentation

## 2019-02-28 ENCOUNTER — Encounter (INDEPENDENT_AMBULATORY_CARE_PROVIDER_SITE_OTHER): Payer: Self-pay | Admitting: Family Medicine

## 2019-02-28 ENCOUNTER — Ambulatory Visit (INDEPENDENT_AMBULATORY_CARE_PROVIDER_SITE_OTHER): Payer: Medicaid Other | Admitting: Family Medicine

## 2019-02-28 ENCOUNTER — Other Ambulatory Visit: Payer: Self-pay

## 2019-02-28 DIAGNOSIS — Z6841 Body Mass Index (BMI) 40.0 and over, adult: Secondary | ICD-10-CM

## 2019-02-28 DIAGNOSIS — F418 Other specified anxiety disorders: Secondary | ICD-10-CM

## 2019-03-01 NOTE — Progress Notes (Signed)
Office: [phone]  /  Fax: [phone] TeleHealth Visit:  [name] has verbally consented to this TeleHealth visit today. The patient is located at home, the provider is located at the [employer] [facility] office. The participants in this visit include the listed provider and patient. The visit was conducted today via Webex.  HPI:   Chief Complaint: OBESITY [name] is here to discuss her progress with her obesity treatment plan. She is keeping a food journal with 1500-1700 calories and 90+ grams of protein and is following her eating plan approximately 30% of the time. She states she is exercising 0 minutes 0 times per week. [name] continues to do well maintaining her weight but is struggling to follow her plan closely. She is working on getting in a routine to make meal planning easier. She is trying to walk the dogs for exercise 1-2 times per week.  We were unable to weigh the patient today for this TeleHealth visit. She feels as if she has maintained her weight since her last visit. She has lost 23 lbs since starting treatment with [place].  Depression with emotional eating behaviors [name] is struggling with emotional eating and using food for comfort to the extent that it is negatively impacting her health. She often snacks when she is not hungry. [name] sometimes feels she is out of control and then feels guilty that she made poor food choices. She has been working on behavior modification techniques to help reduce her emotional eating and has been somewhat successful. [name] reports her mood to be stable on Wellbutrin. She is working on getting more organized and trying to get back to meal planning and prepping. She shows no sign of suicidal or homicidal ideations.  Depression screen [name] 2/9 12/01/2018 09/30/2018 06/09/2018 05/13/2018 05/11/2018  Decreased Interest 1 1 3 2 3   Down, Depressed, Hopeless 1 2 2 1 2   PHQ - 2 Score 2 3 5 3 5   Altered sleeping 2 3 3 1 3   Tired, decreased energy 2  3 3 1 3   Change in appetite 1 0 0 1 2  Feeling bad or failure about yourself  1 2 0 1 3  Trouble concentrating 0 2 2 1 2   Moving slowly or fidgety/restless 1 0 0 0 0  Suicidal thoughts 0 0 0 0 0  PHQ-9 Score 9 13 13 8 18   Difficult doing work/chores Somewhat difficult - Somewhat difficult Somewhat difficult -  Some recent data might be hidden   ASSESSMENT AND PLAN:  No diagnosis found.  PLAN:  Depression with Emotional Eating Behaviors We discussed behavior modification techniques today to help Chalese deal with her emotional eating and depression. [name] was given a refill on her Wellbutrin x1 month and agrees to follow-up with our clinic in 2 weeks.  Obesity [name] is currently in the action stage of change. As such, her goal is to continue with weight loss efforts. She has agreed to keep a food journal with 1500-1700 calories and 90+ grams of protein daily. Gitel has been instructed to work up to a goal of 150 minutes of combined cardio and strengthening exercise per week for weight loss and overall health benefits. We discussed the following Behavioral Modification Strategies today: work on meal planning and easy cooking plans, keeping healthy foods in the home, better snacking choices, and planning for success.  [name] has agreed to follow-up with our clinic in 2 weeks. She was informed of the importance of frequent follow-up visits to maximize  her success with intensive lifestyle modifications for her multiple health conditions.  ALLERGIES: Allergies  Allergen Reactions  . Erythromycin   . Zyrtec [Cetirizine Hcl]     Only allergic to Zyrtec D -     MEDICATIONS: Current Outpatient Medications on File Prior to Visit  Medication Sig Dispense Refill  . acetaminophen (TYLENOL) 325 MG tablet Take 650 mg by mouth every 6 (six) hours as needed.    [name] Kitchen albuterol (PROVENTIL HFA;VENTOLIN HFA) 108 (90 Base) MCG/ACT inhaler Inhale 2 puffs into the lungs every 6 (six) hours as needed for  wheezing or shortness of breath. 1 Inhaler 0  . albuterol (PROVENTIL) (2.5 MG/3ML) 0.083% nebulizer solution Take 3 mLs (2.5 mg total) by nebulization every 6 (six) hours as needed for wheezing or shortness of breath. 150 mL 1  . ARMOUR THYROID 120 MG tablet TAKE 1 TABLET BY MOUTH ONCE DAILY BEFOREBREAKFAST 90 tablet 0  . Azelastine-Fluticasone 137-50 MCG/ACT SUSP Place 1 spray into the nose daily.    . budesonide-formoterol (SYMBICORT) 80-4.5 MCG/ACT inhaler Inhale 2 puffs into the lungs 2 (two) times daily. 1 Inhaler 3  . buPROPion (WELLBUTRIN SR) 150 MG 12 hr tablet Take 1 tablet (150 mg total) by mouth daily. 30 tablet 0  . citalopram (CELEXA) 20 MG tablet Take 20 mg by mouth daily.      . clonazePAM (KLONOPIN) 0.5 MG tablet Take 0.5 mg by mouth 3 (three) times daily as needed for anxiety.    . diclofenac (VOLTAREN) 75 MG EC tablet Take 1 tablet (75 mg total) by mouth daily as needed for mild pain or moderate pain. 90 tablet 0  . fexofenadine (ALLEGRA) 180 MG tablet TAKE 1 TABLET BY MOUTH ONCE DAILY 90 tablet 1  . gabapentin (NEURONTIN) 100 MG capsule Take 100 mg by mouth 3 (three) times daily.    [name] Kitchen guaiFENesin (MUCINEX) 600 MG 12 hr tablet Take 1 tablet (600 mg total) by mouth 2 (two) times daily. 20 tablet 0  . ibuprofen (ADVIL,MOTRIN) 200 MG tablet Take 200 mg by mouth every 6 (six) hours as needed.    . metFORMIN (GLUCOPHAGE) 500 MG tablet Take 1 tablet (500 mg total) by mouth 2 (two) times daily with a meal. 60 tablet 0  . montelukast (SINGULAIR) 10 MG tablet TAKE 1 TABLET BY MOUTH EVERY NIGHT AT BEDTIME 90 tablet 0  . omeprazole (PRILOSEC) 20 MG capsule Take 20 mg by mouth daily.    [name] Kitchen VITAMIN D, CHOLECALCIFEROL, PO Take 50 mcg by mouth 2 (two) times daily.    [name] Kitchen zolpidem (AMBIEN) 5 MG tablet Take 10 mg by mouth at bedtime as needed.      No current facility-administered medications on file prior to visit.     PAST MEDICAL HISTORY: Past Medical History:  Diagnosis Date  . Asthma   .  Chronic fatigue   . Chronic pain    back and hips  . Fibromyalgia   . High cholesterol   . Insomnia   . Prediabetes   . Scoliosis   . Sleep apnea     PAST SURGICAL HISTORY: Past Surgical History:  Procedure Laterality Date  . ABDOMINAL HYSTERECTOMY    . CESAREAN SECTION    . CHOLECYSTECTOMY    . SPINAL FUSION    . THYROIDECTOMY    . TONSILLECTOMY    . TUBAL LIGATION      SOCIAL HISTORY: Social History   Tobacco Use  . Smoking status: Former Smoker    Packs/day: 0.25  Years: 12.00    Pack years: 3.00    Types: Cigarettes    Quit date: 09/07/2004    Years since quitting: 14.4  . Smokeless tobacco: Never Used  Substance Use Topics  . Alcohol use: Yes    Comment: 4 times a year  . Drug use: No    FAMILY HISTORY: Family History  Problem Relation Age of Onset  . Cancer Maternal Uncle   . Heart disease Father   . High Cholesterol Father   . Heart disease Paternal Grandfather   . Breast cancer Maternal Grandmother   . Rheum arthritis Mother   . Obesity Mother   . Rheum arthritis Maternal Grandfather   . Rheum arthritis Maternal Aunt    ROS: Review of Systems  Psychiatric/Behavioral: Positive for depression (emotional eating). Negative for suicidal ideas.       Negative for homicidal ideas.   PHYSICAL EXAM: Pt in no acute distress  RECENT LABS AND TESTS: BMET    Component Value Date/Time   NA 138 08/29/2018 1443   K 4.2 08/29/2018 1443   CL 100 08/29/2018 1443   CO2 24 08/29/2018 1443   GLUCOSE 100 (H) 08/29/2018 1443   GLUCOSE 90 10/01/2017 1041   BUN 13 08/29/2018 1443   CREATININE 0.63 08/29/2018 1443   CREATININE 0.53 10/01/2017 1041   CALCIUM 9.5 08/29/2018 1443   GFRNONAA 109 08/29/2018 1443   GFRNONAA 116 10/01/2017 1041   GFRAA 126 08/29/2018 1443   GFRAA 135 10/01/2017 1041   Lab Results  Component Value Date   HGBA1C 5.6 08/29/2018   HGBA1C 5.8 (H) 04/27/2018   HGBA1C 5.8 (H) 12/07/2017   HGBA1C 6.0 (H) 10/01/2017   Lab Results   Component Value Date   INSULIN 29.5 (H) 08/29/2018   INSULIN 46.1 (H) 04/27/2018   INSULIN 20.1 12/07/2017   CBC    Component Value Date/Time   WBC 5.6 04/27/2018 1144   WBC 7.0 10/01/2017 1041   RBC 4.85 04/27/2018 1144   RBC 5.02 10/01/2017 1041   HGB 13.4 04/27/2018 1144   HCT 42.9 04/27/2018 1144   PLT 244 10/01/2017 1041   MCV 89 04/27/2018 1144   MCH 27.6 04/27/2018 1144   MCH 28.3 10/01/2017 1041   MCHC 31.2 (L) 04/27/2018 1144   MCHC 34.5 10/01/2017 1041   RDW 14.1 04/27/2018 1144   LYMPHSABS 1.3 04/27/2018 1144   EOSABS 0.1 04/27/2018 1144   BASOSABS 0.0 04/27/2018 1144   Iron/TIBC/Ferritin/ %Sat No results found for: IRON, TIBC, FERRITIN, IRONPCTSAT Lipid Panel     Component Value Date/Time   CHOL 151 08/29/2018 1443   TRIG 196 (H) 08/29/2018 1443   HDL 29 (L) 08/29/2018 1443   CHOLHDL 2.9 10/08/2017 1103   LDLCALC 83 08/29/2018 1443   LDLCALC 69 10/08/2017 1103   Hepatic Function Panel     Component Value Date/Time   PROT 6.9 08/29/2018 1443   ALBUMIN 4.3 08/29/2018 1443   AST 24 08/29/2018 1443   ALT 28 08/29/2018 1443   ALKPHOS 64 08/29/2018 1443   BILITOT 0.3 08/29/2018 1443      Component Value Date/Time   TSH 1.390 08/29/2018 1443   TSH 0.377 (L) 04/27/2018 1144   TSH 1.890 02/15/2018 1137   Results for Nolte, Vikki S "Shatori Gwinner" (MRN [id number]) as of 03/01/2019 14:12  Ref. Range 08/29/2018 14:43  Vitamin D, 25-Hydroxy Latest Ref Range: 30.0 - 100.0 ng/mL 46.1    I, Michaelene Song, am acting as Location manager for Dennard Nip, MD  I have reviewed the above documentation for accuracy and completeness, and I agree with the above. -Dennard Nip, MD

## 2019-03-02 ENCOUNTER — Other Ambulatory Visit: Payer: Self-pay | Admitting: Family Medicine

## 2019-03-02 ENCOUNTER — Other Ambulatory Visit (INDEPENDENT_AMBULATORY_CARE_PROVIDER_SITE_OTHER): Payer: Self-pay | Admitting: Family Medicine

## 2019-03-02 DIAGNOSIS — J309 Allergic rhinitis, unspecified: Secondary | ICD-10-CM

## 2019-03-02 DIAGNOSIS — R7303 Prediabetes: Secondary | ICD-10-CM

## 2019-03-02 DIAGNOSIS — F418 Other specified anxiety disorders: Secondary | ICD-10-CM

## 2019-03-02 DIAGNOSIS — E038 Other specified hypothyroidism: Secondary | ICD-10-CM

## 2019-03-03 ENCOUNTER — Other Ambulatory Visit: Payer: Self-pay | Admitting: Family Medicine

## 2019-03-03 DIAGNOSIS — M797 Fibromyalgia: Secondary | ICD-10-CM

## 2019-03-06 MED ORDER — BUPROPION HCL ER (SR) 150 MG PO TB12: 150.0000 mg | ORAL_TABLET | Freq: Every day | ORAL | 0 refills | Status: DC

## 2019-03-07 ENCOUNTER — Other Ambulatory Visit (INDEPENDENT_AMBULATORY_CARE_PROVIDER_SITE_OTHER): Payer: Self-pay | Admitting: Family Medicine

## 2019-03-07 DIAGNOSIS — R7303 Prediabetes: Secondary | ICD-10-CM

## 2019-03-16 ENCOUNTER — Other Ambulatory Visit: Payer: Self-pay

## 2019-03-16 ENCOUNTER — Encounter (INDEPENDENT_AMBULATORY_CARE_PROVIDER_SITE_OTHER): Payer: Self-pay | Admitting: Family Medicine

## 2019-03-16 ENCOUNTER — Telehealth (INDEPENDENT_AMBULATORY_CARE_PROVIDER_SITE_OTHER): Payer: Medicaid Other | Admitting: Family Medicine

## 2019-03-16 DIAGNOSIS — R7303 Prediabetes: Secondary | ICD-10-CM | POA: Diagnosis not present

## 2019-03-16 DIAGNOSIS — Z6841 Body Mass Index (BMI) 40.0 and over, adult: Secondary | ICD-10-CM | POA: Diagnosis not present

## 2019-03-20 NOTE — Progress Notes (Signed)
Office: [phone]  /  Fax: [phone] TeleHealth Visit:  [name] has verbally consented to this TeleHealth visit today. [place], the provider is located at the [employer] [facility] office. The participants in this visit include the listed provider and patient. The visit was conducted today via facetime.  HPI:   Chief Complaint: OBESITY [name] is here to discuss her progress with her obesity treatment plan. She is on the  follow the Category 3 plan and is following her eating plan approximately 40 % of the time. She states she is exercising 0 minutes 0 times per week. [name] feels she is doing well maintaining her weight. She is not following her plan as closely overall mostly portion control and smarter choices. She is on vacation now but plans on getting back on track.   We were unable to weigh the patient today for this TeleHealth visit. She feels as if she has maintained weight since her last visit. She has lost 23 lbs since starting treatment with [place].  Pre-Diabetes [name] has a diagnosis of prediabetes based on her elevated HgA1c and was informed this puts her at greater risk of developing diabetes. She is taking metformin currently and continues to work on diet and exercise to decrease risk of diabetes. She denies nausea, vomiting, or hypoglycemia. She admits to working on diet but not as closely.   ASSESSMENT AND PLAN:   PLAN: Pre-Diabetes [name] will continue to work on weight loss, exercise, and decreasing simple carbohydrates in her diet to help decrease the risk of diabetes. We dicussed metformin including benefits and risks. She was informed that eating too many simple carbohydrates or too many calories at one sitting increases the likelihood of GI side effects. [name] agrees to continue Metformin as prescribed. She agrees to plan on increasing exercise. [name] agreed to follow up with [place] as directed to monitor her progress.  Obesity [name] is  currently in the action stage of change. As such, her goal is to continue with weight loss efforts She has agreed to follow the Category 3 plan [name] has been instructed to work up to a goal of 150 minutes of combined cardio and strengthening exercise per week for weight loss and overall health benefits. We discussed the following Behavioral Modification Stratagies today: keeping healthy foods in the home,  increasing lean protein intake, decreasing simple carbohydrates  and ways to avoid boredom eating   I spent > than 50% of the 15 minute visit on counseling as documented in the note.   [name] has agreed to follow up with our clinic in 3 weeks. She was informed of the importance of frequent follow up visits to maximize her success with intensive lifestyle modifications for her multiple health conditions.  ALLERGIES: Allergies  Allergen Reactions  . Erythromycin   . Zyrtec [Cetirizine Hcl]     Only allergic to Zyrtec D -     MEDICATIONS: Current Outpatient Medications on File Prior to Visit  Medication Sig Dispense Refill  . acetaminophen (TYLENOL) 325 MG tablet Take 650 mg by mouth every 6 (six) hours as needed.    [name] Kitchen albuterol (PROVENTIL HFA;VENTOLIN HFA) 108 (90 Base) MCG/ACT inhaler Inhale 2 puffs into the lungs every 6 (six) hours as needed for wheezing or shortness of breath. 1 Inhaler 0  . albuterol (PROVENTIL) (2.5 MG/3ML) 0.083% nebulizer solution Take 3 mLs (2.5 mg total) by nebulization every 6 (six) hours as needed for wheezing or shortness of breath.  150 mL 1  . ARMOUR THYROID 120 MG tablet TAKE 1 TABLET BY MOUTH ONCE A DAY BEFOREBREAKFAST. 90 tablet 0  . Azelastine-Fluticasone 137-50 MCG/ACT SUSP Place 1 spray into the nose daily.    . budesonide-formoterol (SYMBICORT) 80-4.5 MCG/ACT inhaler Inhale 2 puffs into the lungs 2 (two) times daily. 1 Inhaler 3  . citalopram (CELEXA) 20 MG tablet Take 20 mg by mouth daily.      . clonazePAM (KLONOPIN) 0.5 MG tablet Take 0.5 mg by  mouth 3 (three) times daily as needed for anxiety.    . diclofenac (VOLTAREN) 75 MG EC tablet TAKE 1 TABLET BY MOUTH ONCE A DAY AS NEEDED FOR MILD PAIN OR MODERATEPAIN 90 tablet 0  . fexofenadine (ALLEGRA) 180 MG tablet TAKE 1 TABLET BY MOUTH ONCE DAILY 90 tablet 1  . gabapentin (NEURONTIN) 100 MG capsule Take 100 mg by mouth 3 (three) times daily.    [name] Kitchen guaiFENesin (MUCINEX) 600 MG 12 hr tablet Take 1 tablet (600 mg total) by mouth 2 (two) times daily. 20 tablet 0  . ibuprofen (ADVIL,MOTRIN) 200 MG tablet Take 200 mg by mouth every 6 (six) hours as needed.    . montelukast (SINGULAIR) 10 MG tablet TAKE 1 TABLET BY MOUTH AT BEDTIME 90 tablet 0  . omeprazole (PRILOSEC) 20 MG capsule Take 20 mg by mouth daily.    [name] Kitchen VITAMIN D, CHOLECALCIFEROL, PO Take 50 mcg by mouth 2 (two) times daily.    [name] Kitchen zolpidem (AMBIEN) 5 MG tablet Take 10 mg by mouth at bedtime as needed.      No current facility-administered medications on file prior to visit.     PAST MEDICAL HISTORY: Past Medical History:  Diagnosis Date  . Asthma   . Chronic fatigue   . Chronic pain    back and hips  . Fibromyalgia   . High cholesterol   . Insomnia   . Prediabetes   . Scoliosis   . Sleep apnea     PAST SURGICAL HISTORY: Past Surgical History:  Procedure Laterality Date  . ABDOMINAL HYSTERECTOMY    . CESAREAN SECTION    . CHOLECYSTECTOMY    . SPINAL FUSION    . THYROIDECTOMY    . TONSILLECTOMY    . TUBAL LIGATION      SOCIAL HISTORY: Social History   Tobacco Use  . Smoking status: Former Smoker    Packs/day: 0.25    Years: 12.00    Pack years: 3.00    Types: Cigarettes    Quit date: 09/07/2004    Years since quitting: 14.5  . Smokeless tobacco: Never Used  Substance Use Topics  . Alcohol use: Yes    Comment: 4 times a year  . Drug use: No    FAMILY HISTORY: Family History  Problem Relation Age of Onset  . Cancer Maternal Uncle   . Heart disease Father   . High Cholesterol Father   . Heart disease  Paternal Grandfather   . Breast cancer Maternal Grandmother   . Rheum arthritis Mother   . Obesity Mother   . Rheum arthritis Maternal Grandfather   . Rheum arthritis Maternal Aunt     ROS: Review of Systems  Gastrointestinal: Negative for nausea and vomiting.  Endo/Heme/Allergies:       Negative for hypoglycemia     PHYSICAL EXAM: Pt in no acute distress  RECENT LABS AND TESTS: BMET    Component Value Date/Time   NA 138 08/29/2018 1443   K 4.2 08/29/2018  1443   CL 100 08/29/2018 1443   CO2 24 08/29/2018 1443   GLUCOSE 100 (H) 08/29/2018 1443   GLUCOSE 90 10/01/2017 1041   BUN 13 08/29/2018 1443   CREATININE 0.63 08/29/2018 1443   CREATININE 0.53 10/01/2017 1041   CALCIUM 9.5 08/29/2018 1443   GFRNONAA 109 08/29/2018 1443   GFRNONAA 116 10/01/2017 1041   GFRAA 126 08/29/2018 1443   GFRAA 135 10/01/2017 1041   Lab Results  Component Value Date   HGBA1C 5.6 08/29/2018   HGBA1C 5.8 (H) 04/27/2018   HGBA1C 5.8 (H) 12/07/2017   HGBA1C 6.0 (H) 10/01/2017   Lab Results  Component Value Date   INSULIN 29.5 (H) 08/29/2018   INSULIN 46.1 (H) 04/27/2018   INSULIN 20.1 12/07/2017   CBC    Component Value Date/Time   WBC 5.6 04/27/2018 1144   WBC 7.0 10/01/2017 1041   RBC 4.85 04/27/2018 1144   RBC 5.02 10/01/2017 1041   HGB 13.4 04/27/2018 1144   HCT 42.9 04/27/2018 1144   PLT 244 10/01/2017 1041   MCV 89 04/27/2018 1144   MCH 27.6 04/27/2018 1144   MCH 28.3 10/01/2017 1041   MCHC 31.2 (L) 04/27/2018 1144   MCHC 34.5 10/01/2017 1041   RDW 14.1 04/27/2018 1144   LYMPHSABS 1.3 04/27/2018 1144   EOSABS 0.1 04/27/2018 1144   BASOSABS 0.0 04/27/2018 1144   Iron/TIBC/Ferritin/ %Sat No results found for: IRON, TIBC, FERRITIN, IRONPCTSAT Lipid Panel     Component Value Date/Time   CHOL 151 08/29/2018 1443   TRIG 196 (H) 08/29/2018 1443   HDL 29 (L) 08/29/2018 1443   CHOLHDL 2.9 10/08/2017 1103   LDLCALC 83 08/29/2018 1443   LDLCALC 69 10/08/2017 1103    Hepatic Function Panel     Component Value Date/Time   PROT 6.9 08/29/2018 1443   ALBUMIN 4.3 08/29/2018 1443   AST 24 08/29/2018 1443   ALT 28 08/29/2018 1443   ALKPHOS 64 08/29/2018 1443   BILITOT 0.3 08/29/2018 1443      Component Value Date/Time   TSH 1.390 08/29/2018 1443   TSH 0.377 (L) 04/27/2018 1144   TSH 1.890 02/15/2018 1137      I, Renee Ramus, am acting as Location manager for Dennard Nip, MD  I have reviewed the above documentation for accuracy and completeness, and I agree with the above. -Dennard Nip, MD

## 2019-03-21 ENCOUNTER — Encounter (INDEPENDENT_AMBULATORY_CARE_PROVIDER_SITE_OTHER): Payer: Self-pay | Admitting: Family Medicine

## 2019-03-22 NOTE — Telephone Encounter (Signed)
Please advise 

## 2019-03-27 ENCOUNTER — Encounter: Payer: Self-pay | Admitting: Family Medicine

## 2019-03-28 ENCOUNTER — Ambulatory Visit (INDEPENDENT_AMBULATORY_CARE_PROVIDER_SITE_OTHER): Payer: Medicaid Other | Admitting: Family Medicine

## 2019-03-28 ENCOUNTER — Encounter: Payer: Self-pay | Admitting: Family Medicine

## 2019-03-28 DIAGNOSIS — R0781 Pleurodynia: Secondary | ICD-10-CM

## 2019-03-28 DIAGNOSIS — M25561 Pain in right knee: Secondary | ICD-10-CM | POA: Diagnosis not present

## 2019-03-28 DIAGNOSIS — M25531 Pain in right wrist: Secondary | ICD-10-CM

## 2019-03-28 DIAGNOSIS — R0789 Other chest pain: Secondary | ICD-10-CM

## 2019-03-28 MED ORDER — CYCLOBENZAPRINE HCL 10 MG PO TABS: 5.0000 mg | ORAL_TABLET | Freq: Three times a day (TID) | ORAL | 0 refills | Status: DC | PRN

## 2019-03-28 MED ORDER — TRAMADOL HCL 50 MG PO TABS: 50.0000 mg | ORAL_TABLET | Freq: Three times a day (TID) | ORAL | 0 refills | Status: AC | PRN

## 2019-03-28 NOTE — Progress Notes (Signed)
Name: [name]   MRN: [id number]    DOB: 08/17/1976   Date:03/28/2019       Progress Note  Subjective  Chief Complaint  Chief Complaint  Patient presents with  . Fall    She fell yesterday morning. She injuried herself on her left wrist and right knee. She twisted her wrist and bruised her right knee. She has pain on the right side of her ribs.    I connected with  [name]  on 03/28/19 at 11:20 AM EDT by a video enabled telemedicine application and verified that I am speaking with the correct person using two identifiers.  I discussed the limitations of evaluation and management by telemedicine and the availability of in person appointments. The patient expressed understanding and agreed to proceed. Staff also discussed with the patient that there may be a patient responsible charge related to this service. Patient Location: home  Provider Location: [facility]   HPI  She states she stepped down from her porch on her right foot and twisted her ankle and fell on left wrist and right knee, she states she twisted her trunk but did not hit her trunk, and has severe pain on right side of chest over rib cage. She states pain with rom of her left wrist . Pain right now is 5-6/10 while not moving, however with movement pain shoots to 8/10.     Patient Active Problem List   Diagnosis Date Noted  . Depression 02/21/2019  . Class 3 severe obesity with serious comorbidity and body mass index (BMI) of 50.0 to 59.9 in adult ([place]) 10/20/2018  . Osteopenia 09/30/2018  . Mild intermittent asthma with acute exacerbation 05/13/2018  . Allergic rhinitis 03/09/2018  . Tension headache 03/09/2018  . Other fatigue 12/07/2017  . Shortness of breath on exertion 12/07/2017  . Other specified hypothyroidism 12/07/2017  . Prediabetes 10/15/2017  . Gastroesophageal reflux disease without esophagitis 10/15/2017  . Nasal valve collapse 10/15/2017  . History of subtotal thyroidectomy  10/01/2017  . Chronic midline back pain 10/01/2017  . Vitamin D deficiency 10/01/2017  . Anxiety 10/01/2017  . PCOS (polycystic ovarian syndrome) 10/01/2017  . Severe episode of recurrent major depressive disorder, without psychotic features ([place]) 10/01/2017  . BMI 50.0-59.9, adult ([place]) 10/01/2017  . Fibromyalgia 10/01/2017  . Postoperative hypothyroidism 10/01/2017  . OSA (obstructive sleep apnea) 02/26/2014    Social History   Tobacco Use  . Smoking status: Former Smoker    Packs/day: 0.25    Years: 12.00    Pack years: 3.00    Types: Cigarettes    Quit date: 09/07/2004    Years since quitting: 14.5  . Smokeless tobacco: Never Used  Substance Use Topics  . Alcohol use: Yes    Comment: 4 times a year     Current Outpatient Medications:  .  acetaminophen (TYLENOL) 325 MG tablet, Take 650 mg by mouth every 6 (six) hours as needed., Disp: , Rfl:  .  albuterol (PROVENTIL HFA;VENTOLIN HFA) 108 (90 Base) MCG/ACT inhaler, Inhale 2 puffs into the lungs every 6 (six) hours as needed for wheezing or shortness of breath., Disp: 1 Inhaler, Rfl: 0 .  albuterol (PROVENTIL) (2.5 MG/3ML) 0.083% nebulizer solution, Take 3 mLs (2.5 mg total) by nebulization every 6 (six) hours as needed for wheezing or shortness of breath., Disp: 150 mL, Rfl: 1 .  ARMOUR THYROID 120 MG tablet, TAKE 1 TABLET BY MOUTH ONCE A DAY BEFOREBREAKFAST., Disp: 90 tablet, Rfl:  0 .  Azelastine-Fluticasone 137-50 MCG/ACT SUSP, Place 1 spray into the nose daily., Disp: , Rfl:  .  budesonide-formoterol (SYMBICORT) 80-4.5 MCG/ACT inhaler, Inhale 2 puffs into the lungs 2 (two) times daily., Disp: 1 Inhaler, Rfl: 3 .  buPROPion (WELLBUTRIN SR) 150 MG 12 hr tablet, Take 1 tablet (150 mg total) by mouth daily., Disp: 30 tablet, Rfl: 0 .  citalopram (CELEXA) 20 MG tablet, Take 20 mg by mouth daily.  , Disp: , Rfl:  .  clonazePAM (KLONOPIN) 0.5 MG tablet, Take 0.5 mg by mouth 3 (three) times daily as needed for anxiety., Disp: , Rfl:   .  diclofenac (VOLTAREN) 75 MG EC tablet, TAKE 1 TABLET BY MOUTH ONCE A DAY AS NEEDED FOR MILD PAIN OR MODERATEPAIN, Disp: 90 tablet, Rfl: 0 .  fexofenadine (ALLEGRA) 180 MG tablet, TAKE 1 TABLET BY MOUTH ONCE DAILY, Disp: 90 tablet, Rfl: 1 .  gabapentin (NEURONTIN) 100 MG capsule, Take 100 mg by mouth 3 (three) times daily., Disp: , Rfl:  .  guaiFENesin (MUCINEX) 600 MG 12 hr tablet, Take 1 tablet (600 mg total) by mouth 2 (two) times daily., Disp: 20 tablet, Rfl: 0 .  ibuprofen (ADVIL,MOTRIN) 200 MG tablet, Take 200 mg by mouth every 6 (six) hours as needed., Disp: , Rfl:  .  metFORMIN (GLUCOPHAGE) 500 MG tablet, TAKE 1 TABLET BY MOUTH 2 TIMES DAILY WITH A MEAL, Disp: 60 tablet, Rfl: 0 .  montelukast (SINGULAIR) 10 MG tablet, TAKE 1 TABLET BY MOUTH AT BEDTIME, Disp: 90 tablet, Rfl: 0 .  omeprazole (PRILOSEC) 20 MG capsule, Take 20 mg by mouth daily., Disp: , Rfl:  .  VITAMIN D, CHOLECALCIFEROL, PO, Take 50 mcg by mouth 2 (two) times daily., Disp: , Rfl:  .  zolpidem (AMBIEN) 5 MG tablet, Take 10 mg by mouth at bedtime as needed. , Disp: , Rfl:   Allergies  Allergen Reactions  . Erythromycin   . Zyrtec [Cetirizine Hcl]     Only allergic to Zyrtec D -     I personally reviewed active problem list, medication list, allergies, family history with the patient/caregiver today.  ROS  Ten systems reviewed and is negative except as mentioned in HPI   Objective  Virtual encounter, vitals not obtained.  There is no height or weight on file to calculate BMI.  Nursing Note and Vital Signs reviewed.  Physical Exam  Awake, alert and oriented, no distress, connection was poor and it was switched from video to telephone encounter, did not see wrist   Assessment & Plan    -Red flags and when to present for emergency care or RTC including fever >101.85F, chest pain, shortness of breath, new/worsening/un-resolving symptoms, - I discussed the assessment and treatment plan with the patient. The  patient was provided an opportunity to ask questions and all were answered. The patient agreed with the plan and demonstrated an understanding of the instructions.  I provided 25 minutes of non-face-to-face time during this encounter.  Loistine Chance, MD

## 2019-03-29 ENCOUNTER — Other Ambulatory Visit: Payer: Self-pay

## 2019-03-29 ENCOUNTER — Ambulatory Visit
Admission: RE | Admit: 2019-03-29 | Discharge: 2019-03-29 | Disposition: A | Discharge status: Home / Self Care | Payer: Medicaid Other | Source: Ambulatory Visit | Attending: Family Medicine | Admitting: Family Medicine

## 2019-03-29 ENCOUNTER — Other Ambulatory Visit: Payer: Self-pay | Admitting: Family Medicine

## 2019-03-29 ENCOUNTER — Ambulatory Visit
Admission: RE | Admit: 2019-03-29 | Discharge: 2019-03-29 | Disposition: A | Discharge status: Home / Self Care | Payer: Medicaid Other | Attending: Family Medicine | Admitting: Family Medicine

## 2019-03-29 DIAGNOSIS — R0781 Pleurodynia: Secondary | ICD-10-CM

## 2019-03-29 DIAGNOSIS — M25532 Pain in left wrist: Secondary | ICD-10-CM | POA: Insufficient documentation

## 2019-03-29 DIAGNOSIS — S6992XA Unspecified injury of left wrist, hand and finger(s), initial encounter: Secondary | ICD-10-CM | POA: Diagnosis not present

## 2019-03-29 DIAGNOSIS — S299XXA Unspecified injury of thorax, initial encounter: Secondary | ICD-10-CM | POA: Diagnosis not present

## 2019-03-29 DIAGNOSIS — M25531 Pain in right wrist: Secondary | ICD-10-CM | POA: Diagnosis not present

## 2019-03-29 IMAGING — CR RIGHT RIBS - 2 VIEW
1 series · 5 of 5 positions shown · non-contrast
Comparison: None.

CLINICAL DATA: Right lower lateral rib pain after fall 1 pneumonia.

EXAM:
RIGHT RIBS - 2 VIEW

[Series 1: dg ribs unilateral right · 0.14mm/px · 5 of 5 slices shown]
[im 1/5]
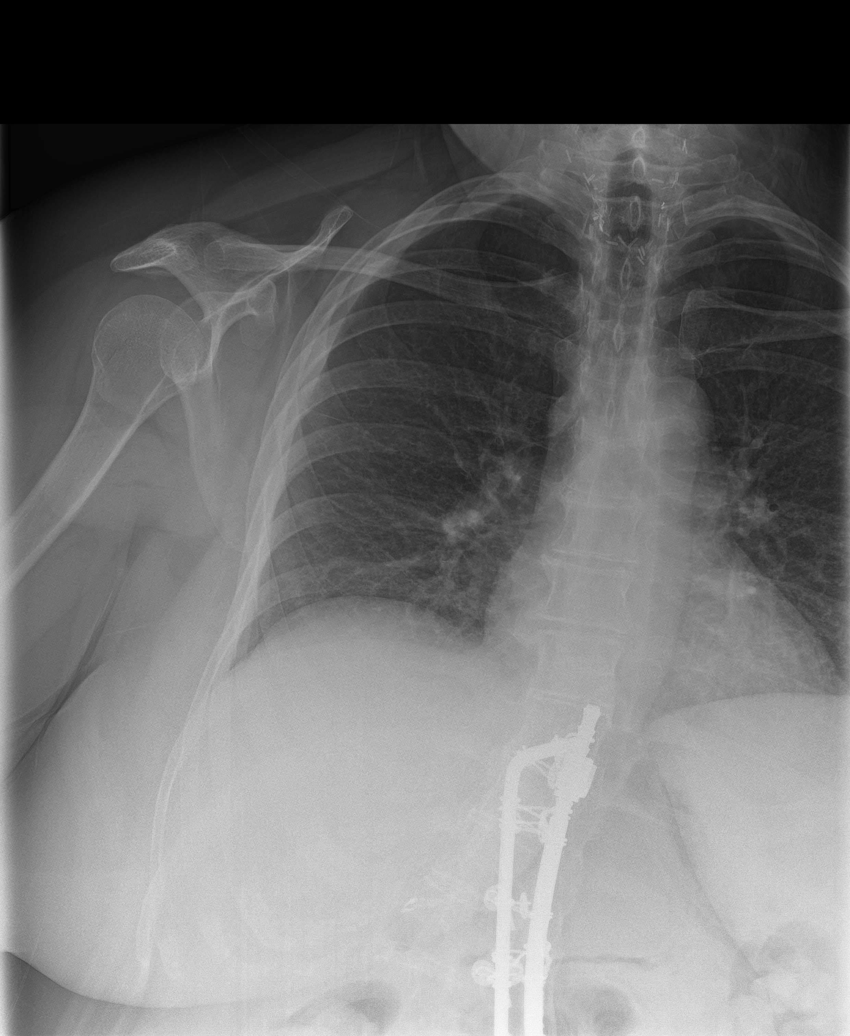
[im 2/5]
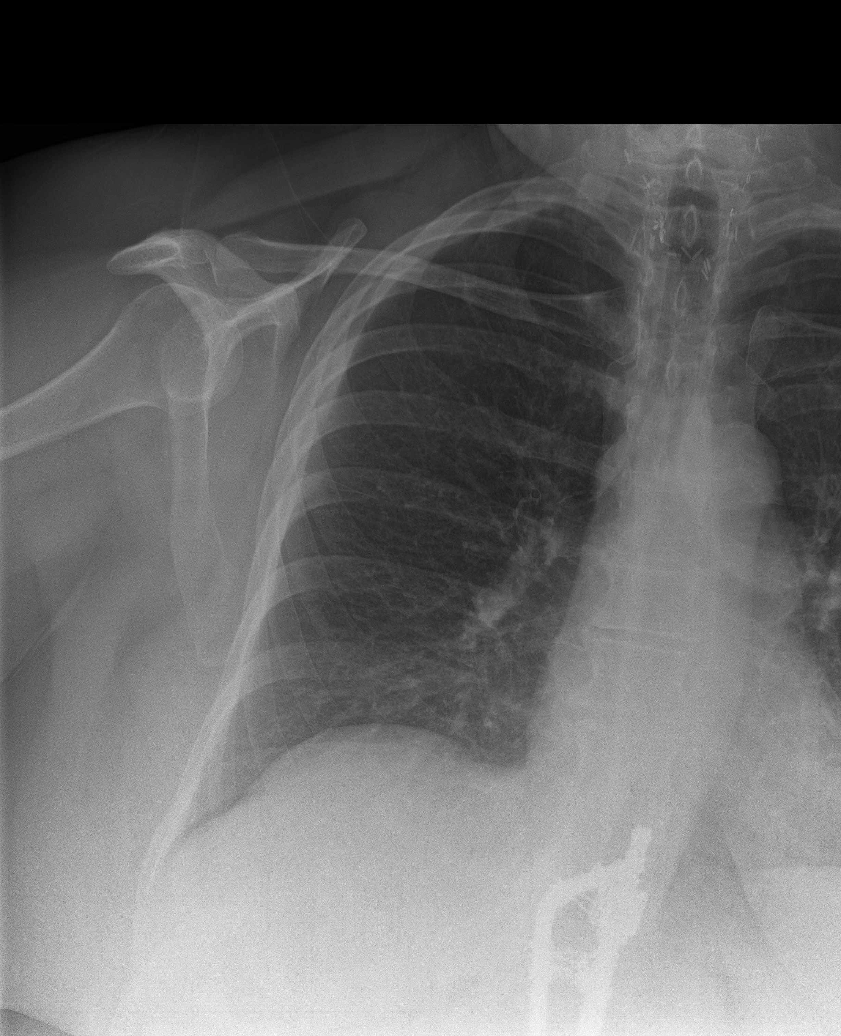
[im 3/5]
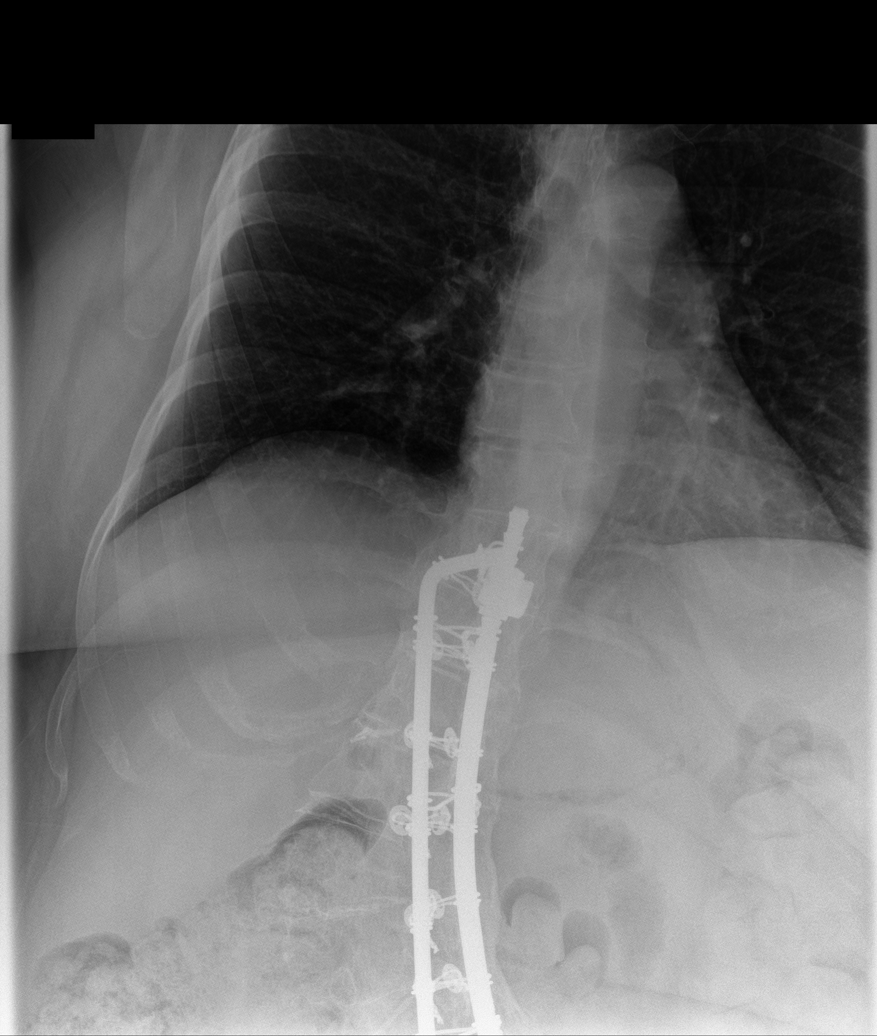
[im 4/5]
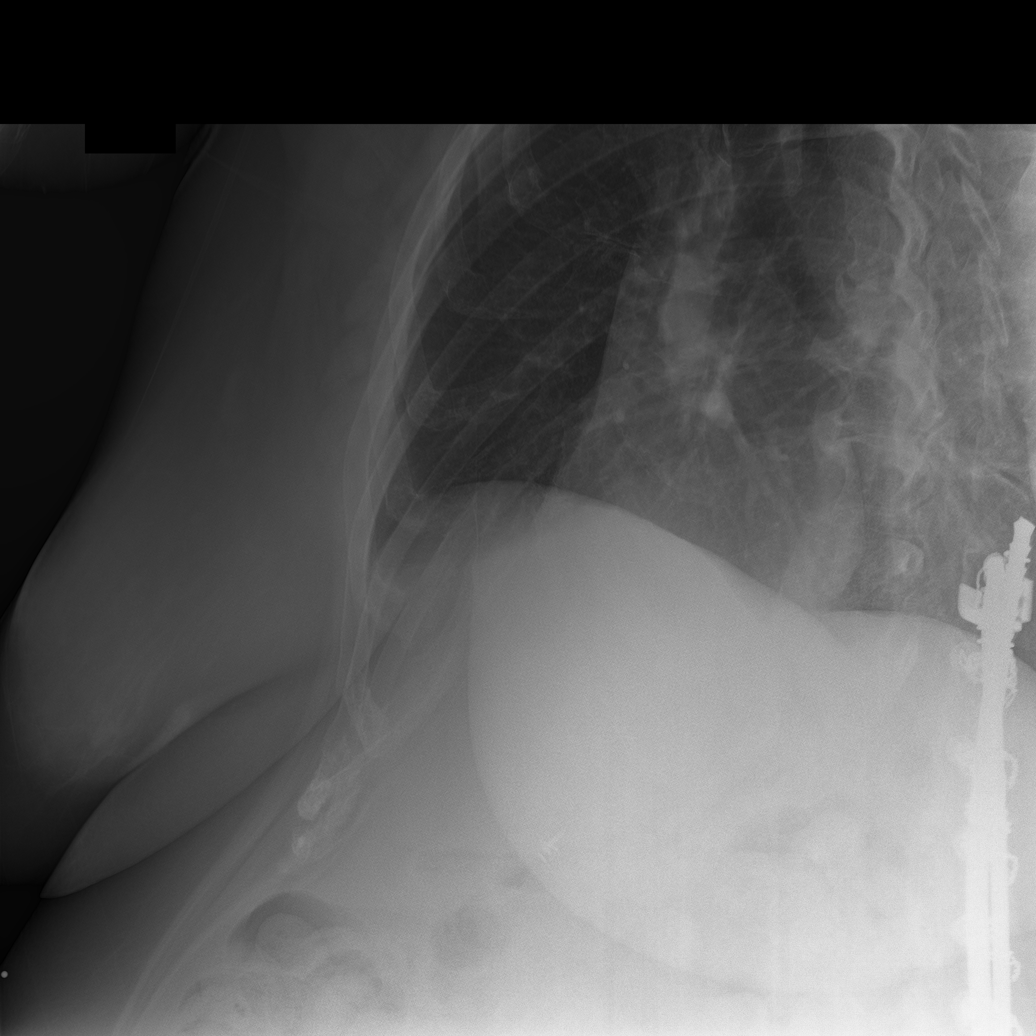
[im 5/5]
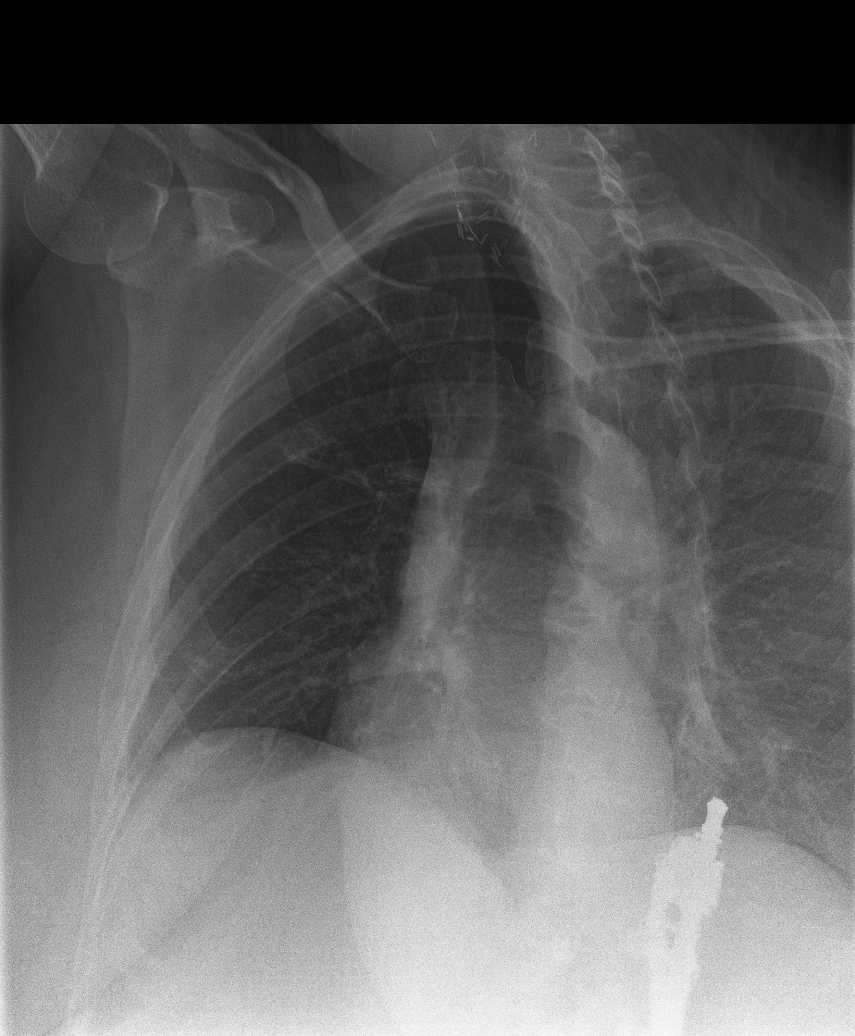

[5 of 5 positions shown; findings below may reference images not displayed]

FINDINGS: Possible nondisplaced right anterolateral eighth rib fracture.
Remaining ribs are intact. No pulmonary complication such as
pneumothorax or pleural effusion. Scoliosis and spinal fusion
hardware is partially included.
IMPRESSION: Possible nondisplaced right anterolateral eighth rib fracture. No
pulmonary complication.

## 2019-04-03 ENCOUNTER — Encounter (INDEPENDENT_AMBULATORY_CARE_PROVIDER_SITE_OTHER): Payer: Self-pay | Admitting: Family Medicine

## 2019-04-03 ENCOUNTER — Ambulatory Visit (INDEPENDENT_AMBULATORY_CARE_PROVIDER_SITE_OTHER): Payer: Medicaid Other | Admitting: Family Medicine

## 2019-04-03 ENCOUNTER — Other Ambulatory Visit: Payer: Self-pay

## 2019-04-03 VITALS — BP 118/78 | HR 88 | Temp 97.6°F | Ht 61.0 in | Wt 277.0 lb

## 2019-04-03 DIAGNOSIS — F418 Other specified anxiety disorders: Secondary | ICD-10-CM

## 2019-04-03 DIAGNOSIS — Z6841 Body Mass Index (BMI) 40.0 and over, adult: Secondary | ICD-10-CM | POA: Diagnosis not present

## 2019-04-03 DIAGNOSIS — R7303 Prediabetes: Secondary | ICD-10-CM

## 2019-04-03 MED ORDER — BUPROPION HCL ER (SR) 150 MG PO TB12: 150.0000 mg | ORAL_TABLET | Freq: Every day | ORAL | 0 refills | Status: DC

## 2019-04-03 MED ORDER — METFORMIN HCL 500 MG PO TABS: ORAL_TABLET | ORAL | 0 refills | Status: DC

## 2019-04-04 NOTE — Progress Notes (Signed)
Office: [phone]  /  Fax: [phone]   HPI:   Chief Complaint: OBESITY [name] is here to discuss her progress with her obesity treatment plan. She was advised to follow the Category 3 plan at the last visit, however she is journaling. [name] is journaling approximately 60 % of the time. She states she is exercising 0 minutes 0 times per week. [name]'s last visit in the office was four months ago. She has gained 8 pounds during that time while in COVID-19 isolation. She has been skipping some meals, and she is struggling with meal prep especially. Her weight is 277 lb (125.6 kg) today and has had a weight gain of 8 pounds since her last in-office visit. She has lost 15 lbs since starting treatment with [place].  Pre-Diabetes  [name] has a diagnosis of prediabetes based on her elevated Hgb A1c and was informed this puts her at greater risk of developing diabetes. [name] is stable on metformin, but she has increased simple carbohydrates over the last few months. [name] continues to work on diet and exercise to decrease risk of diabetes. She denies nausea or hypoglycemia.  Depression with emotional eating behaviors [name] struggles with emotional eating and using food for comfort to the extent that it is negatively impacting her health. She often snacks when she is not hungry. [name] sometimes feels she is out of control and then feels guilty that she made poor food choices. Her mood is stable on Wellbutrin. She feels she has done better with emotional eating, but she still struggles regularly. She has been working on behavior modification techniques to help reduce her emotional eating and has been somewhat successful. She shows no sign of suicidal or homicidal ideations.  ASSESSMENT AND PLAN:  Class 3 severe obesity with serious comorbidity and body mass index (BMI) of 50.0 to 59.9 in adult, unspecified obesity type ([name])  Prediabetes - Plan: metFORMIN (GLUCOPHAGE) 500 MG tablet  Depression with  anxiety - Plan: buPROPion (WELLBUTRIN SR) 150 MG 12 hr tablet  PLAN:  Pre-Diabetes [name] will continue to work on weight loss, exercise, and decreasing simple carbohydrates in her diet to help decrease the risk of diabetes. We dicussed metformin including benefits and risks. She was informed that eating too many simple carbohydrates or too many calories at one sitting increases the likelihood of GI side effects. [name] agrees to continue metformin 500 mg two times daily with a meal #60 with no refills and get back to diet prescription. [name] agreed to follow up with [place] as directed to monitor her progress.  Depression with Emotional Eating Behaviors We discussed behavior modification techniques today to help [name] deal with her emotional eating and depression. She has agreed to continue Wellbutrin SR 150 mg daily #30 with no refills and follow up as directed.  Obesity [name] is currently in the action stage of change. As such, her goal is to continue with weight loss efforts She has agreed to keep a food journal with 1600 calories and 90+ grams of protein daily [name] has been instructed to work up to a goal of 150 minutes of combined cardio and strengthening exercise per week for weight loss and overall health benefits. We discussed the following Behavioral Modification Strategies today: no skipping meals, increasing lean protein intake, decreasing simple carbohydrates, work on meal planning and easy cooking plans and emotional eating strategies  [name] has agreed to follow up with our clinic in 2 weeks. She was informed of the importance of frequent follow up visits  to maximize her success with intensive lifestyle modifications for her multiple health conditions.  ALLERGIES: Allergies  Allergen Reactions  . Erythromycin   . Zyrtec [Cetirizine Hcl]     Only allergic to Zyrtec D -     MEDICATIONS: Current Outpatient Medications on File Prior to Visit  Medication Sig Dispense Refill  .  acetaminophen (TYLENOL) 325 MG tablet Take 650 mg by mouth every 6 (six) hours as needed.    [name] Kitchen albuterol (PROVENTIL HFA;VENTOLIN HFA) 108 (90 Base) MCG/ACT inhaler Inhale 2 puffs into the lungs every 6 (six) hours as needed for wheezing or shortness of breath. 1 Inhaler 0  . albuterol (PROVENTIL) (2.5 MG/3ML) 0.083% nebulizer solution Take 3 mLs (2.5 mg total) by nebulization every 6 (six) hours as needed for wheezing or shortness of breath. 150 mL 1  . ARMOUR THYROID 120 MG tablet TAKE 1 TABLET BY MOUTH ONCE A DAY BEFOREBREAKFAST. 90 tablet 0  . Azelastine-Fluticasone 137-50 MCG/ACT SUSP Place 1 spray into the nose daily.    . budesonide-formoterol (SYMBICORT) 80-4.5 MCG/ACT inhaler Inhale 2 puffs into the lungs 2 (two) times daily. 1 Inhaler 3  . citalopram (CELEXA) 20 MG tablet Take 20 mg by mouth daily.      . clonazePAM (KLONOPIN) 0.5 MG tablet Take 0.5 mg by mouth 3 (three) times daily as needed for anxiety.    . cyclobenzaprine (FLEXERIL) 10 MG tablet Take 0.5-1 tablets (5-10 mg total) by mouth 3 (three) times daily as needed for muscle spasms. 30 tablet 0  . diclofenac (VOLTAREN) 75 MG EC tablet TAKE 1 TABLET BY MOUTH ONCE A DAY AS NEEDED FOR MILD PAIN OR MODERATEPAIN 90 tablet 0  . fexofenadine (ALLEGRA) 180 MG tablet TAKE 1 TABLET BY MOUTH ONCE DAILY 90 tablet 1  . gabapentin (NEURONTIN) 100 MG capsule Take 100 mg by mouth 3 (three) times daily.    [name] Kitchen guaiFENesin (MUCINEX) 600 MG 12 hr tablet Take 1 tablet (600 mg total) by mouth 2 (two) times daily. 20 tablet 0  . Homeopathic Products (ARNICARE ARTHRITIS) TBDP Take by mouth.    [name] Kitchen ibuprofen (ADVIL,MOTRIN) 200 MG tablet Take 200 mg by mouth every 6 (six) hours as needed.    . montelukast (SINGULAIR) 10 MG tablet TAKE 1 TABLET BY MOUTH AT BEDTIME 90 tablet 0  . omeprazole (PRILOSEC) 20 MG capsule Take 20 mg by mouth daily.    . traMADol (ULTRAM) 50 MG tablet Take 50 mg by mouth every 6 (six) hours as needed.    [name] Kitchen VITAMIN D, CHOLECALCIFEROL,  PO Take 50 mcg by mouth 2 (two) times daily.    [name] Kitchen zolpidem (AMBIEN) 5 MG tablet Take 10 mg by mouth at bedtime as needed.      No current facility-administered medications on file prior to visit.     PAST MEDICAL HISTORY: Past Medical History:  Diagnosis Date  . Asthma   . Chronic fatigue   . Chronic pain    back and hips  . Fibromyalgia   . High cholesterol   . Insomnia   . Prediabetes   . Scoliosis   . Sleep apnea     PAST SURGICAL HISTORY: Past Surgical History:  Procedure Laterality Date  . ABDOMINAL HYSTERECTOMY    . CESAREAN SECTION    . CHOLECYSTECTOMY    . SPINAL FUSION    . THYROIDECTOMY    . TONSILLECTOMY    . TUBAL LIGATION      SOCIAL HISTORY: Social History   Tobacco Use  .  Smoking status: Former Smoker    Packs/day: 0.25    Years: 12.00    Pack years: 3.00    Types: Cigarettes    Quit date: 09/07/2004    Years since quitting: 14.5  . Smokeless tobacco: Never Used  Substance Use Topics  . Alcohol use: Yes    Comment: 4 times a year  . Drug use: No    FAMILY HISTORY: Family History  Problem Relation Age of Onset  . Cancer Maternal Uncle   . Heart disease Father   . High Cholesterol Father   . Heart disease Paternal Grandfather   . Breast cancer Maternal Grandmother   . Rheum arthritis Mother   . Obesity Mother   . Rheum arthritis Maternal Grandfather   . Rheum arthritis Maternal Aunt     ROS: Review of Systems  Constitutional: Negative for weight loss.  Gastrointestinal: Negative for nausea.  Endo/Heme/Allergies:       Negative for hypoglycemia  Psychiatric/Behavioral: Positive for depression. Negative for suicidal ideas.    PHYSICAL EXAM: Blood pressure 118/78, pulse 88, temperature 97.6 F (36.4 C), temperature source Oral, height 5\' 1"  (1.549 m), weight 277 lb (125.6 kg), SpO2 95 %. Body mass index is 52.34 kg/m. Physical Exam Vitals signs reviewed.  Constitutional:      Appearance: Normal appearance. She is  well-developed. She is obese.  Cardiovascular:     Rate and Rhythm: Normal rate.  Pulmonary:     Effort: Pulmonary effort is normal.  Musculoskeletal: Normal range of motion.  Skin:    General: Skin is warm and dry.  Neurological:     Mental Status: She is alert and oriented to person, place, and time.  Psychiatric:        Mood and Affect: Mood normal.        Behavior: Behavior normal.        Thought Content: Thought content does not include homicidal or suicidal ideation.     RECENT LABS AND TESTS: BMET    Component Value Date/Time   NA 138 08/29/2018 1443   K 4.2 08/29/2018 1443   CL 100 08/29/2018 1443   CO2 24 08/29/2018 1443   GLUCOSE 100 (H) 08/29/2018 1443   GLUCOSE 90 10/01/2017 1041   BUN 13 08/29/2018 1443   CREATININE 0.63 08/29/2018 1443   CREATININE 0.53 10/01/2017 1041   CALCIUM 9.5 08/29/2018 1443   GFRNONAA 109 08/29/2018 1443   GFRNONAA 116 10/01/2017 1041   GFRAA 126 08/29/2018 1443   GFRAA 135 10/01/2017 1041   Lab Results  Component Value Date   HGBA1C 5.6 08/29/2018   HGBA1C 5.8 (H) 04/27/2018   HGBA1C 5.8 (H) 12/07/2017   HGBA1C 6.0 (H) 10/01/2017   Lab Results  Component Value Date   INSULIN 29.5 (H) 08/29/2018   INSULIN 46.1 (H) 04/27/2018   INSULIN 20.1 12/07/2017   CBC    Component Value Date/Time   WBC 5.6 04/27/2018 1144   WBC 7.0 10/01/2017 1041   RBC 4.85 04/27/2018 1144   RBC 5.02 10/01/2017 1041   HGB 13.4 04/27/2018 1144   HCT 42.9 04/27/2018 1144   PLT 244 10/01/2017 1041   MCV 89 04/27/2018 1144   MCH 27.6 04/27/2018 1144   MCH 28.3 10/01/2017 1041   MCHC 31.2 (L) 04/27/2018 1144   MCHC 34.5 10/01/2017 1041   RDW 14.1 04/27/2018 1144   LYMPHSABS 1.3 04/27/2018 1144   EOSABS 0.1 04/27/2018 1144   BASOSABS 0.0 04/27/2018 1144   Iron/TIBC/Ferritin/ %Sat No results  found for: IRON, TIBC, FERRITIN, IRONPCTSAT Lipid Panel     Component Value Date/Time   CHOL 151 08/29/2018 1443   TRIG 196 (H) 08/29/2018 1443    HDL 29 (L) 08/29/2018 1443   CHOLHDL 2.9 10/08/2017 1103   LDLCALC 83 08/29/2018 1443   LDLCALC 69 10/08/2017 1103   Hepatic Function Panel     Component Value Date/Time   PROT 6.9 08/29/2018 1443   ALBUMIN 4.3 08/29/2018 1443   AST 24 08/29/2018 1443   ALT 28 08/29/2018 1443   ALKPHOS 64 08/29/2018 1443   BILITOT 0.3 08/29/2018 1443      Component Value Date/Time   TSH 1.390 08/29/2018 1443   TSH 0.377 (L) 04/27/2018 1144   TSH 1.890 02/15/2018 1137     Ref. Range 08/29/2018 14:43  Vitamin D, 25-Hydroxy Latest Ref Range: 30.0 - 100.0 ng/mL 46.1    OBESITY BEHAVIORAL INTERVENTION VISIT  Today's visit was # 26  Starting weight: 292 lbs Starting date: 12/07/2017 Today's weight : 277 lbs Today's date: 04/03/2019 Total lbs lost to date: 15    04/03/2019  Height 5\' 1"  (1.549 m)  Weight 277 lb (125.6 kg)  BMI (Calculated) 52.37  BLOOD PRESSURE - SYSTOLIC 244  BLOOD PRESSURE - DIASTOLIC 78   Body Fat % 62.8 %  Total Body Water (lbs) 93.2 lbs    ASK: We discussed the diagnosis of obesity with [name] today and [name] agreed to give [place] permission to discuss obesity behavioral modification therapy today.  ASSESS: [name] has the diagnosis of obesity and her BMI today is 52.37 [name] is in the action stage of change   ADVISE: [name] was educated on the multiple health risks of obesity as well as the benefit of weight loss to improve her health. She was advised of the need for long term treatment and the importance of lifestyle modifications to improve her current health and to decrease her risk of future health problems.  AGREE: Multiple dietary modification options and treatment options were discussed and  [name] agreed to follow the recommendations documented in the above note.  ARRANGE: [name] was educated on the importance of frequent visits to treat obesity as outlined per CMS and USPSTF guidelines and agreed to schedule her next follow up appointment today.  I,  Doreene Nest, am acting as transcriptionist for Dennard Nip, MD  I have reviewed the above documentation for accuracy and completeness, and I agree with the above. -Dennard Nip, MD

## 2019-04-06 ENCOUNTER — Other Ambulatory Visit: Payer: Self-pay

## 2019-04-17 ENCOUNTER — Encounter (INDEPENDENT_AMBULATORY_CARE_PROVIDER_SITE_OTHER): Payer: Self-pay | Admitting: Family Medicine

## 2019-04-17 ENCOUNTER — Telehealth (INDEPENDENT_AMBULATORY_CARE_PROVIDER_SITE_OTHER): Payer: Medicaid Other | Admitting: Family Medicine

## 2019-04-17 ENCOUNTER — Other Ambulatory Visit: Payer: Self-pay

## 2019-04-17 DIAGNOSIS — J029 Acute pharyngitis, unspecified: Secondary | ICD-10-CM | POA: Diagnosis not present

## 2019-04-17 DIAGNOSIS — F418 Other specified anxiety disorders: Secondary | ICD-10-CM | POA: Diagnosis not present

## 2019-04-17 DIAGNOSIS — Z6841 Body Mass Index (BMI) 40.0 and over, adult: Secondary | ICD-10-CM

## 2019-04-19 NOTE — Progress Notes (Signed)
Office: [phone]  /  Fax: [phone] TeleHealth Visit:  [name] has verbally consented to this TeleHealth visit today. The patient is located at home, the provider is located at the [facility] office. The participants in this visit include the listed provider and patient. The visit was conducted today via FaceTime.  HPI:   Chief Complaint: OBESITY [name] is here to discuss her progress with her obesity treatment plan. She is on the Category 3 plan and is following her eating plan approximately 60% of the time. She states she is exercising 0 minutes 0 times per week. [name] feels she has done well maintaining her weight since her last visit. She feels she is eating 1000 calories a day but is still not losing weight. Her RMR was 2481, which suggests she is eating/drinking more calories than she recognizes.  We were unable to weigh the patient today for this TeleHealth visit. She feels as if she has maintained her weight since her last visit. She has lost 15 lbs since starting treatment with [place].  Depression with emotional eating behaviors [name] is struggling with emotional eating and using food for comfort to the extent that it is negatively impacting her health. She often snacks when she is not hungry. [name] sometimes feels she is out of control and then feels guilty that she made poor food choices. She has been working on behavior modification techniques to help reduce her emotional eating and has been somewhat successful. [name] is on Celexa and Wellbutrin and feels it is helping with her emotional eating. She denies insomnia and shows no sign of suicidal or homicidal ideations.  Depression screen [name] 2/9 03/28/2019 12/01/2018 09/30/2018 06/09/2018 05/13/2018  Decreased Interest 1 1 1 3 2   Down, Depressed, Hopeless 1 1 2 2 1   PHQ - 2 Score 2 2 3 5 3   Altered sleeping 2 2 3 3 1   Tired, decreased energy 2 2 3 3 1   Change in appetite 0 1 0 0 1  Feeling bad or failure about  yourself  1 1 2  0 1  Trouble concentrating 1 0 2 2 1   Moving slowly or fidgety/restless 0 1 0 0 0  Suicidal thoughts 0 0 0 0 0  PHQ-9 Score 8 9 13 13 8   Difficult doing work/chores Very difficult Somewhat difficult - Somewhat difficult Somewhat difficult  Some recent data might be hidden   Pharyngitis [name] woke up with a sore throat this a.m. She has spent time with her sister who has spent time with people at a wedding who ended up with COVID and had no PPE. She feels this is related to her allergic rhinitis. No fever or cough.  ASSESSMENT AND PLAN:  Depression with anxiety  Pharyngitis, unspecified etiology  Class 3 severe obesity with serious comorbidity and body mass index (BMI) of 50.0 to 59.9 in adult, unspecified obesity type ([place])  PLAN:  Depression with Emotional Eating Behaviors We discussed behavior modification techniques today to help [name] deal with her emotional eating and depression. [place] will continue her medications as is and will continue to monitor. We discussed CBT to help identify emotional eating.  Pharyngitis [name] was instructed to isolate and monitor her symptoms. She was advised to be tested for COVID if symptoms worsen or progress.  I spent > than 50% of the 25 minute visit on counseling as documented in the note.  Obesity [name] is currently in the action stage of change. As such, her goal is  to continue with weight loss efforts. She has agreed to keep a food journal with 1600 calories and 90 grams of protein.  [name] has been instructed to work up to a goal of 150 minutes of combined cardio and strengthening exercise per week for weight loss and overall health benefits. We discussed the following Behavioral Modification Strategies today: better snacking choices, emotional eating strategies, and keep a strict food journal.  [name] has agreed to follow-up with our clinic in 2 weeks. She was informed of the importance of frequent follow-up visits to  maximize her success with intensive lifestyle modifications for her multiple health conditions.  ALLERGIES: Allergies  Allergen Reactions  . Erythromycin   . Zyrtec [Cetirizine Hcl]     Only allergic to Zyrtec D -     MEDICATIONS: Current Outpatient Medications on File Prior to Visit  Medication Sig Dispense Refill  . acetaminophen (TYLENOL) 325 MG tablet Take 650 mg by mouth every 6 (six) hours as needed.    [name] Kitchen albuterol (PROVENTIL HFA;VENTOLIN HFA) 108 (90 Base) MCG/ACT inhaler Inhale 2 puffs into the lungs every 6 (six) hours as needed for wheezing or shortness of breath. 1 Inhaler 0  . albuterol (PROVENTIL) (2.5 MG/3ML) 0.083% nebulizer solution Take 3 mLs (2.5 mg total) by nebulization every 6 (six) hours as needed for wheezing or shortness of breath. 150 mL 1  . ARMOUR THYROID 120 MG tablet TAKE 1 TABLET BY MOUTH ONCE A DAY BEFOREBREAKFAST. 90 tablet 0  . Azelastine-Fluticasone 137-50 MCG/ACT SUSP Place 1 spray into the nose daily.    . budesonide-formoterol (SYMBICORT) 80-4.5 MCG/ACT inhaler Inhale 2 puffs into the lungs 2 (two) times daily. 1 Inhaler 3  . buPROPion (WELLBUTRIN SR) 150 MG 12 hr tablet Take 1 tablet (150 mg total) by mouth daily. 30 tablet 0  . citalopram (CELEXA) 20 MG tablet Take 20 mg by mouth daily.      . clonazePAM (KLONOPIN) 0.5 MG tablet Take 0.5 mg by mouth 3 (three) times daily as needed for anxiety.    . cyclobenzaprine (FLEXERIL) 10 MG tablet Take 0.5-1 tablets (5-10 mg total) by mouth 3 (three) times daily as needed for muscle spasms. 30 tablet 0  . diclofenac (VOLTAREN) 75 MG EC tablet TAKE 1 TABLET BY MOUTH ONCE A DAY AS NEEDED FOR MILD PAIN OR MODERATEPAIN 90 tablet 0  . fexofenadine (ALLEGRA) 180 MG tablet TAKE 1 TABLET BY MOUTH ONCE DAILY 90 tablet 1  . gabapentin (NEURONTIN) 100 MG capsule Take 100 mg by mouth 3 (three) times daily.    [name] Kitchen guaiFENesin (MUCINEX) 600 MG 12 hr tablet Take 1 tablet (600 mg total) by mouth 2 (two) times daily. 20 tablet 0   . Homeopathic Products (ARNICARE ARTHRITIS) TBDP Take by mouth.    [name] Kitchen ibuprofen (ADVIL,MOTRIN) 200 MG tablet Take 200 mg by mouth every 6 (six) hours as needed.    . metFORMIN (GLUCOPHAGE) 500 MG tablet TAKE 1 TABLET BY MOUTH 2 TIMES DAILY WITH A MEAL 60 tablet 0  . montelukast (SINGULAIR) 10 MG tablet TAKE 1 TABLET BY MOUTH AT BEDTIME 90 tablet 0  . omeprazole (PRILOSEC) 20 MG capsule Take 20 mg by mouth daily.    . traMADol (ULTRAM) 50 MG tablet Take 50 mg by mouth every 6 (six) hours as needed.    [name] Kitchen VITAMIN D, CHOLECALCIFEROL, PO Take 50 mcg by mouth 2 (two) times daily.    [name] Kitchen zolpidem (AMBIEN) 5 MG tablet Take 10 mg by mouth at bedtime as needed.  No current facility-administered medications on file prior to visit.     PAST MEDICAL HISTORY: Past Medical History:  Diagnosis Date  . Asthma   . Chronic fatigue   . Chronic pain    back and hips  . Fibromyalgia   . High cholesterol   . Insomnia   . Prediabetes   . Scoliosis   . Sleep apnea     PAST SURGICAL HISTORY: Past Surgical History:  Procedure Laterality Date  . ABDOMINAL HYSTERECTOMY    . CESAREAN SECTION    . CHOLECYSTECTOMY    . SPINAL FUSION    . THYROIDECTOMY    . TONSILLECTOMY    . TUBAL LIGATION      SOCIAL HISTORY: Social History   Tobacco Use  . Smoking status: Former Smoker    Packs/day: 0.25    Years: 12.00    Pack years: 3.00    Types: Cigarettes    Quit date: 09/07/2004    Years since quitting: 14.6  . Smokeless tobacco: Never Used  Substance Use Topics  . Alcohol use: Yes    Comment: 4 times a year  . Drug use: No    FAMILY HISTORY: Family History  Problem Relation Age of Onset  . Cancer Maternal Uncle   . Heart disease Father   . High Cholesterol Father   . Heart disease Paternal Grandfather   . Breast cancer Maternal Grandmother   . Rheum arthritis Mother   . Obesity Mother   . Rheum arthritis Maternal Grandfather   . Rheum arthritis Maternal Aunt    ROS: Review of Systems   HENT:       Positive for pharyngitis.  Psychiatric/Behavioral: Positive for depression. Negative for suicidal ideas. The patient does not have insomnia.        Negative for homicidal ideas.   PHYSICAL EXAM: Pt in no acute distress  RECENT LABS AND TESTS: BMET    Component Value Date/Time   NA 138 08/29/2018 1443   K 4.2 08/29/2018 1443   CL 100 08/29/2018 1443   CO2 24 08/29/2018 1443   GLUCOSE 100 (H) 08/29/2018 1443   GLUCOSE 90 10/01/2017 1041   BUN 13 08/29/2018 1443   CREATININE 0.63 08/29/2018 1443   CREATININE 0.53 10/01/2017 1041   CALCIUM 9.5 08/29/2018 1443   GFRNONAA 109 08/29/2018 1443   GFRNONAA 116 10/01/2017 1041   GFRAA 126 08/29/2018 1443   GFRAA 135 10/01/2017 1041   Lab Results  Component Value Date   HGBA1C 5.6 08/29/2018   HGBA1C 5.8 (H) 04/27/2018   HGBA1C 5.8 (H) 12/07/2017   HGBA1C 6.0 (H) 10/01/2017   Lab Results  Component Value Date   INSULIN 29.5 (H) 08/29/2018   INSULIN 46.1 (H) 04/27/2018   INSULIN 20.1 12/07/2017   CBC    Component Value Date/Time   WBC 5.6 04/27/2018 1144   WBC 7.0 10/01/2017 1041   RBC 4.85 04/27/2018 1144   RBC 5.02 10/01/2017 1041   HGB 13.4 04/27/2018 1144   HCT 42.9 04/27/2018 1144   PLT 244 10/01/2017 1041   MCV 89 04/27/2018 1144   MCH 27.6 04/27/2018 1144   MCH 28.3 10/01/2017 1041   MCHC 31.2 (L) 04/27/2018 1144   MCHC 34.5 10/01/2017 1041   RDW 14.1 04/27/2018 1144   LYMPHSABS 1.3 04/27/2018 1144   EOSABS 0.1 04/27/2018 1144   BASOSABS 0.0 04/27/2018 1144   Iron/TIBC/Ferritin/ %Sat No results found for: IRON, TIBC, FERRITIN, IRONPCTSAT Lipid Panel     Component Value  Date/Time   CHOL 151 08/29/2018 1443   TRIG 196 (H) 08/29/2018 1443   HDL 29 (L) 08/29/2018 1443   CHOLHDL 2.9 10/08/2017 1103   LDLCALC 83 08/29/2018 1443   LDLCALC 69 10/08/2017 1103   Hepatic Function Panel     Component Value Date/Time   PROT 6.9 08/29/2018 1443   ALBUMIN 4.3 08/29/2018 1443   AST 24 08/29/2018  1443   ALT 28 08/29/2018 1443   ALKPHOS 64 08/29/2018 1443   BILITOT 0.3 08/29/2018 1443      Component Value Date/Time   TSH 1.390 08/29/2018 1443   TSH 0.377 (L) 04/27/2018 1144   TSH 1.890 02/15/2018 1137   Results for Schmuhl, Lunell S "Syerra Ragin" (MRN [id number]) as of 04/19/2019 09:12  Ref. Range 08/29/2018 14:43  Vitamin D, 25-Hydroxy Latest Ref Range: 30.0 - 100.0 ng/mL 46.1   I, Michaelene Song, am acting as Location manager for Dennard Nip, MD I have reviewed the above documentation for accuracy and completeness, and I agree with the above. -Dennard Nip, MD

## 2019-05-01 ENCOUNTER — Encounter (INDEPENDENT_AMBULATORY_CARE_PROVIDER_SITE_OTHER): Payer: Self-pay | Admitting: Family Medicine

## 2019-05-01 ENCOUNTER — Other Ambulatory Visit: Payer: Self-pay

## 2019-05-01 ENCOUNTER — Ambulatory Visit (INDEPENDENT_AMBULATORY_CARE_PROVIDER_SITE_OTHER): Payer: Medicaid Other | Admitting: Family Medicine

## 2019-05-01 DIAGNOSIS — F418 Other specified anxiety disorders: Secondary | ICD-10-CM

## 2019-05-01 DIAGNOSIS — Z6841 Body Mass Index (BMI) 40.0 and over, adult: Secondary | ICD-10-CM

## 2019-05-01 DIAGNOSIS — R7303 Prediabetes: Secondary | ICD-10-CM

## 2019-05-01 MED ORDER — BUPROPION HCL ER (SR) 150 MG PO TB12: 150.0000 mg | ORAL_TABLET | Freq: Every day | ORAL | 0 refills | Status: DC

## 2019-05-01 MED ORDER — METFORMIN HCL 500 MG PO TABS: ORAL_TABLET | ORAL | 0 refills | Status: DC

## 2019-05-02 NOTE — Progress Notes (Signed)
Office: [phone]  /  Fax: [phone] TeleHealth Visit:  Marissa RABALAIS has verbally consented to this TeleHealth visit today. The patient is located at home, the provider is located at the [employer] [facility] office. The participants in this visit include the listed provider and patient. The visit was conducted today via telephone call - FaceTime failed and changed to telephone call.  HPI:   Chief Complaint: OBESITY [name] is here to discuss her progress with her obesity treatment plan. She is keeping a food journal with 1600 calories and 90 grams of protein and is following her eating plan approximately 80% of the time. She states she is exercising 0 minutes 0 times per week. [name] changed her visit to TeleHealth due to a sinus infection. She has done well with journaling and states she is meeting her macro goals well. We were unable to weigh the patient today for this TeleHealth visit. She feels as if she has lost about 4 lbs since her last visit. She has lost 15 lbs since starting treatment with [place].  Pre-Diabetes [name] has a diagnosis of prediabetes based on her elevated Hgb A1c and was informed this puts her at greater risk of developing diabetes. She is stable on metformin currently and is doing well with diet and weight loss. She reports decreased polyphagia and denies nausea, vomiting, or hypoglycemia.  Depression with Anxiety [name] is struggling with emotional eating and using food for comfort to the extent that it is negatively impacting her health. She often snacks when she is not hungry. [name] sometimes feels she is out of control and then feels guilty that she made poor food choices. She has been working on behavior modification techniques to help reduce her emotional eating and has been somewhat successful. [name] states her mood is well controlled overall. She feels more in control of her life and is working on decreasing emotional eating. She shows no sign of suicidal or  homicidal ideations.  Depression screen [name]  Decreased Interest 1 1 1 3 2   Down, Depressed, Hopeless 1 1 2 2 1   PHQ - 2 Score 2 2 3 5 3   Altered sleeping 2 2 3 3 1   Tired, decreased energy 2 2 3 3 1   Change in appetite 0 1 0 0 1  Feeling bad or failure about yourself  1 1 2  0 1  Trouble concentrating 1 0 2 2 1   Moving slowly or fidgety/restless 0 1 0 0 0  Suicidal thoughts 0 0 0 0 0  PHQ-9 Score 8 9 13 13 8   Difficult doing work/chores Very difficult Somewhat difficult - Somewhat difficult Somewhat difficult  Some recent data might be hidden   ASSESSMENT AND PLAN:  Prediabetes - Plan: metFORMIN (GLUCOPHAGE) 500 MG tablet  Depression with anxiety - Plan: buPROPion (WELLBUTRIN SR) 150 MG 12 hr tablet  Class 3 severe obesity with serious comorbidity and body mass index (BMI) of 50.0 to 59.9 in adult, unspecified obesity type ([name])  PLAN:  Pre-Diabetes [name] will continue to work on weight loss, exercise, and decreasing simple carbohydrates in her diet to help decrease the risk of diabetes. We dicussed metformin including benefits and risks. She was informed that eating too many simple carbohydrates or too many calories at one sitting increases the likelihood of GI side effects. [name] was given a refill on her metformin 500 mg #60 with 0 refills and she agrees to follow-up with our clinic in 2-3 weeks.  She will continue with diet and exercise.  Depression with Anxiety We discussed behavior modification techniques today to help [name] deal with her emotional eating and depression. [name] was given a refill on her Wellbutrin 150 mg #30 with 0 refills and she agrees to follow-up with our clinic in 2-3 weeks.  I spent > than 50% of the 25 minute visit on counseling as documented in the note.  Obesity [name] is currently in the action stage of change. As such, her goal is to continue with weight loss efforts. She has agreed to keep a food  journal with 1600 calories and 90 grams of protein.  [name] has been instructed to work up to a goal of 150 minutes of combined cardio and strengthening exercise per week for weight loss and overall health benefits. We discussed the following Behavioral Modification Strategies today: increasing lean protein intake, decreasing simple carbohydrates, and emotional eating strategies.  [name] has agreed to follow-up with our clinic in 2-3 weeks. She was informed of the importance of frequent follow-up visits to maximize her success with intensive lifestyle modifications for her multiple health conditions.  ALLERGIES: Allergies  Allergen Reactions   Erythromycin    Zyrtec [Cetirizine Hcl]     Only allergic to Zyrtec D -     MEDICATIONS: Current Outpatient Medications on File Prior to Visit  Medication Sig Dispense Refill   acetaminophen (TYLENOL) 325 MG tablet Take 650 mg by mouth every 6 (six) hours as needed.     albuterol (PROVENTIL HFA;VENTOLIN HFA) 108 (90 Base) MCG/ACT inhaler Inhale 2 puffs into the lungs every 6 (six) hours as needed for wheezing or shortness of breath. 1 Inhaler 0   albuterol (PROVENTIL) (2.5 MG/3ML) 0.083% nebulizer solution Take 3 mLs (2.5 mg total) by nebulization every 6 (six) hours as needed for wheezing or shortness of breath. 150 mL 1   ARMOUR THYROID 120 MG tablet TAKE 1 TABLET BY MOUTH ONCE A DAY BEFOREBREAKFAST. 90 tablet 0   Azelastine-Fluticasone 137-50 MCG/ACT SUSP Place 1 spray into the nose daily.     budesonide-formoterol (SYMBICORT) 80-4.5 MCG/ACT inhaler Inhale 2 puffs into the lungs 2 (two) times daily. 1 Inhaler 3   citalopram (CELEXA) 20 MG tablet Take 20 mg by mouth daily.       clonazePAM (KLONOPIN) 0.5 MG tablet Take 0.5 mg by mouth 3 (three) times daily as needed for anxiety.     cyclobenzaprine (FLEXERIL) 10 MG tablet Take 0.5-1 tablets (5-10 mg total) by mouth 3 (three) times daily as needed for muscle spasms. 30 tablet 0   diclofenac  (VOLTAREN) 75 MG EC tablet TAKE 1 TABLET BY MOUTH ONCE A DAY AS NEEDED FOR MILD PAIN OR MODERATEPAIN 90 tablet 0   fexofenadine (ALLEGRA) 180 MG tablet TAKE 1 TABLET BY MOUTH ONCE DAILY 90 tablet 1   gabapentin (NEURONTIN) 100 MG capsule Take 100 mg by mouth 3 (three) times daily.     guaiFENesin (MUCINEX) 600 MG 12 hr tablet Take 1 tablet (600 mg total) by mouth 2 (two) times daily. 20 tablet 0   Homeopathic Products (ARNICARE ARTHRITIS) TBDP Take by mouth.     ibuprofen (ADVIL,MOTRIN) 200 MG tablet Take 200 mg by mouth every 6 (six) hours as needed.     montelukast (SINGULAIR) 10 MG tablet TAKE 1 TABLET BY MOUTH AT BEDTIME 90 tablet 0   omeprazole (PRILOSEC) 20 MG capsule Take 20 mg by mouth daily.     traMADol (ULTRAM) 50 MG tablet Take 50 mg by  mouth every 6 (six) hours as needed.     VITAMIN D, CHOLECALCIFEROL, PO Take 50 mcg by mouth 2 (two) times daily.     zolpidem (AMBIEN) 5 MG tablet Take 10 mg by mouth at bedtime as needed.      No current facility-administered medications on file prior to visit.     PAST MEDICAL HISTORY: Past Medical History:  Diagnosis Date   Asthma    Chronic fatigue    Chronic pain    back and hips   Fibromyalgia    High cholesterol    Insomnia    Prediabetes    Scoliosis    Sleep apnea     PAST SURGICAL HISTORY: Past Surgical History:  Procedure Laterality Date   ABDOMINAL HYSTERECTOMY     CESAREAN SECTION     CHOLECYSTECTOMY     SPINAL FUSION     THYROIDECTOMY     TONSILLECTOMY     TUBAL LIGATION      SOCIAL HISTORY: Social History   Tobacco Use   Smoking status: Former Smoker    Packs/day: 0.25    Years: 12.00    Pack years: 3.00    Types: Cigarettes    Quit date: 09/07/2004    Years since quitting: 14.6   Smokeless tobacco: Never Used  Substance Use Topics   Alcohol use: Yes    Comment: 4 times a year   Drug use: No    FAMILY HISTORY: Family History  Problem Relation Age of Onset   Cancer  Maternal Uncle    Heart disease Father    High Cholesterol Father    Heart disease Paternal Grandfather    Breast cancer Maternal Grandmother    Rheum arthritis Mother    Obesity Mother    Rheum arthritis Maternal Grandfather    Rheum arthritis Maternal Aunt    ROS: Review of Systems  Gastrointestinal: Negative for nausea and vomiting.  Endo/Heme/Allergies:       Positive for decreased polyphagia. Negative for hypoglycemia.  Psychiatric/Behavioral: Positive for depression. Negative for suicidal ideas. The patient is nervous/anxious.        Negative for homicidal ideas.   PHYSICAL EXAM: Pt in no acute distress  RECENT LABS AND TESTS: BMET    Component Value Date/Time   NA 138 08/29/2018 1443   K 4.2 08/29/2018 1443   CL 100 08/29/2018 1443   CO2 24 08/29/2018 1443   GLUCOSE 100 (H) 08/29/2018 1443   GLUCOSE 90 10/01/2017 1041   BUN 13 08/29/2018 1443   CREATININE 0.63 08/29/2018 1443   CREATININE 0.53 10/01/2017 1041   CALCIUM 9.5 08/29/2018 1443   GFRNONAA 109 08/29/2018 1443   GFRNONAA 116 10/01/2017 1041   GFRAA 126 08/29/2018 1443   GFRAA 135 10/01/2017 1041   Lab Results  Component Value Date   HGBA1C 5.6 08/29/2018   HGBA1C 5.8 (H) 04/27/2018   HGBA1C 5.8 (H) 12/07/2017   HGBA1C 6.0 (H) 10/01/2017   Lab Results  Component Value Date   INSULIN 29.5 (H) 08/29/2018   INSULIN 46.1 (H) 04/27/2018   INSULIN 20.1 12/07/2017   CBC    Component Value Date/Time   WBC 5.6 04/27/2018 1144   WBC 7.0 10/01/2017 1041   RBC 4.85 04/27/2018 1144   RBC 5.02 10/01/2017 1041   HGB 13.4 04/27/2018 1144   HCT 42.9 04/27/2018 1144   PLT 244 10/01/2017 1041   MCV 89 04/27/2018 1144   MCH 27.6 04/27/2018 1144   MCH 28.3 10/01/2017 1041  MCHC 31.2 (L) 04/27/2018 1144   MCHC 34.5 10/01/2017 1041   RDW 14.1 04/27/2018 1144   LYMPHSABS 1.3 04/27/2018 1144   EOSABS 0.1 04/27/2018 1144   BASOSABS 0.0 04/27/2018 1144   Iron/TIBC/Ferritin/ %Sat No results found  for: IRON, TIBC, FERRITIN, IRONPCTSAT Lipid Panel     Component Value Date/Time   CHOL 151 08/29/2018 1443   TRIG 196 (H) 08/29/2018 1443   HDL 29 (L) 08/29/2018 1443   CHOLHDL 2.9 10/08/2017 1103   LDLCALC 83 08/29/2018 1443   LDLCALC 69 10/08/2017 1103   Hepatic Function Panel     Component Value Date/Time   PROT 6.9 08/29/2018 1443   ALBUMIN 4.3 08/29/2018 1443   AST 24 08/29/2018 1443   ALT 28 08/29/2018 1443   ALKPHOS 64 08/29/2018 1443   BILITOT 0.3 08/29/2018 1443      Component Value Date/Time   TSH 1.390 08/29/2018 1443   TSH 0.377 (L) 04/27/2018 1144   TSH 1.890 02/15/2018 1137   Results for Goller, Zyah S "[place]" (MRN [id number]) as of 05/02/2019 07:47  Ref. Range 08/29/2018 14:43  Vitamin D, 25-Hydroxy Latest Ref Range: 30.0 - 100.0 ng/mL 46.1   I, Michaelene Song, am acting as Location manager for Dennard Nip, MD I have reviewed the above documentation for accuracy and completeness, and I agree with the above. -Dennard Nip, MD

## 2019-05-16 ENCOUNTER — Telehealth: Payer: Self-pay | Admitting: Family Medicine

## 2019-05-16 DIAGNOSIS — Z8669 Personal history of other diseases of the nervous system and sense organs: Secondary | ICD-10-CM

## 2019-05-16 DIAGNOSIS — H9201 Otalgia, right ear: Secondary | ICD-10-CM

## 2019-05-16 DIAGNOSIS — J309 Allergic rhinitis, unspecified: Secondary | ICD-10-CM

## 2019-05-16 NOTE — Telephone Encounter (Signed)
Requested medication (s) are due for refill today: yes  Requested medication (s) are on the active medication list: yes  Last refill:  04/17/2019  Future visit scheduled: yes  Notes to clinic:  Ordering provider and pcp are different   Requested Prescriptions  Pending Prescriptions Disp Refills   fexofenadine (ALLEGRA) 180 MG tablet [Pharmacy Med Name: FEXOFENADINE HCL 180 MG TAB] 90 tablet 1    Sig: TAKE 1 TABLET BY MOUTH ONCE DAILY     Ear, Nose, and Throat:  Antihistamines Passed - 05/16/2019  1:18 PM      Passed - Valid encounter within last 12 months    Recent Outpatient Visits          1 month ago Acute pain of right wrist   [facility] Steele Sizer, MD   5 months ago Respiratory infection   [place], [place], FNP   7 months ago Well woman exam (no gynecological exam)   [place], FNP   10 months ago Vertigo   [place], NP   1 year ago Upper respiratory tract infection, unspecified type   [place], Astrid Divine, [place]

## 2019-05-17 ENCOUNTER — Other Ambulatory Visit: Payer: Self-pay | Admitting: Emergency Medicine

## 2019-05-17 DIAGNOSIS — H9201 Otalgia, right ear: Secondary | ICD-10-CM

## 2019-05-17 DIAGNOSIS — Z8669 Personal history of other diseases of the nervous system and sense organs: Secondary | ICD-10-CM

## 2019-05-17 DIAGNOSIS — J309 Allergic rhinitis, unspecified: Secondary | ICD-10-CM

## 2019-05-17 MED ORDER — FEXOFENADINE HCL 180 MG PO TABS: 180.0000 mg | ORAL_TABLET | Freq: Every day | ORAL | 1 refills | Status: DC

## 2019-05-17 NOTE — Telephone Encounter (Signed)
Order sent to [facility]

## 2019-05-18 ENCOUNTER — Other Ambulatory Visit: Payer: Self-pay

## 2019-05-18 ENCOUNTER — Ambulatory Visit (INDEPENDENT_AMBULATORY_CARE_PROVIDER_SITE_OTHER): Payer: Medicaid Other | Admitting: Family Medicine

## 2019-05-18 ENCOUNTER — Encounter (INDEPENDENT_AMBULATORY_CARE_PROVIDER_SITE_OTHER): Payer: Self-pay | Admitting: Family Medicine

## 2019-05-18 VITALS — BP 122/70 | HR 90 | Temp 97.9°F | Ht 61.0 in | Wt 279.0 lb

## 2019-05-18 DIAGNOSIS — F3289 Other specified depressive episodes: Secondary | ICD-10-CM | POA: Diagnosis not present

## 2019-05-18 DIAGNOSIS — Z6841 Body Mass Index (BMI) 40.0 and over, adult: Secondary | ICD-10-CM

## 2019-05-22 NOTE — Progress Notes (Signed)
Office: [phone]  /  Fax: [phone]   HPI:   Chief Complaint: OBESITY [name] is here to discuss her progress with her obesity treatment plan. She is on the keep a food journal with 1600 calories and 90 grams of protein daily plan and she is following her eating plan approximately 60 % of the time. She states she is exercising 0 minutes 0 times per week. [name] has been struggling with mindless eating and with meal prepping when it is just her and her husband. She is frustrated with herself and she is feeling discouraged. Her weight is 279 lb (126.6 kg) today and has had a weight loss of 2 pounds over a period of since her last in-office visit. She has lost 13 lbs since starting treatment with [place].  Depression with emotional eating behaviors Scout's mood has decreased and she has done more emotional eating. She notes increased irritability and she is struggling to adjust to COVID-19 changes. [name] struggles with emotional eating and using food for comfort to the extent that it is negatively impacting her health. She often snacks when she is not hungry. [name] sometimes feels she is out of control and then feels guilty that she made poor food choices. She has been working on behavior modification techniques to help reduce her emotional eating and has been somewhat successful. She shows no sign of suicidal or homicidal ideations.  ASSESSMENT AND PLAN:  Other depression - with emotional eating   Class 3 severe obesity with serious comorbidity and body mass index (BMI) of 50.0 to 59.9 in adult, unspecified obesity type ([place])  PLAN:  Depression with Emotional Eating Behaviors We discussed behavior modification techniques today to help [name] decrease her emotional eating. She will continue her medications as prescribed and we will continue to monitor closely. [name] agreed to follow up as directed.  I spent > than 50% of the 25 minute visit on counseling as documented in the note.  Obesity  [name] is currently in the action stage of change. As such, her goal is to continue with weight loss efforts She has agreed to keep a food journal with 1600 calories and 90 grams of protein daily [name] has been instructed to work up to a goal of 150 minutes of combined cardio and strengthening exercise per week for weight loss and overall health benefits. We discussed the following Behavioral Modification Strategies today: dealing with family or coworker sabotage and emotional eating strategies  [name] was offered encouragement and support, and we will continue to monitor closely.  [name] has agreed to follow up with our clinic in 2 weeks. She was informed of the importance of frequent follow up visits to maximize her success with intensive lifestyle modifications for her multiple health conditions.  ALLERGIES: Allergies  Allergen Reactions  . Erythromycin   . Zyrtec [Cetirizine Hcl]     Only allergic to Zyrtec D -     MEDICATIONS: Current Outpatient Medications on File Prior to Visit  Medication Sig Dispense Refill  . acetaminophen (TYLENOL) 325 MG tablet Take 650 mg by mouth every 6 (six) hours as needed.    [name] Kitchen albuterol (PROVENTIL HFA;VENTOLIN HFA) 108 (90 Base) MCG/ACT inhaler Inhale 2 puffs into the lungs every 6 (six) hours as needed for wheezing or shortness of breath. 1 Inhaler 0  . albuterol (PROVENTIL) (2.5 MG/3ML) 0.083% nebulizer solution Take 3 mLs (2.5 mg total) by nebulization every 6 (six) hours as needed for wheezing or shortness of breath. 150 mL 1  .  ARMOUR THYROID 120 MG tablet TAKE 1 TABLET BY MOUTH ONCE A DAY BEFOREBREAKFAST. 90 tablet 0  . Azelastine-Fluticasone 137-50 MCG/ACT SUSP Place 1 spray into the nose daily.    . budesonide-formoterol (SYMBICORT) 80-4.5 MCG/ACT inhaler Inhale 2 puffs into the lungs 2 (two) times daily. 1 Inhaler 3  . buPROPion (WELLBUTRIN SR) 150 MG 12 hr tablet Take 1 tablet (150 mg total) by mouth daily. 30 tablet 0  . citalopram (CELEXA) 20  MG tablet Take 20 mg by mouth daily.      . clonazePAM (KLONOPIN) 0.5 MG tablet Take 0.5 mg by mouth 3 (three) times daily as needed for anxiety.    . cyclobenzaprine (FLEXERIL) 10 MG tablet Take 0.5-1 tablets (5-10 mg total) by mouth 3 (three) times daily as needed for muscle spasms. 30 tablet 0  . diclofenac (VOLTAREN) 75 MG EC tablet TAKE 1 TABLET BY MOUTH ONCE A DAY AS NEEDED FOR MILD PAIN OR MODERATEPAIN 90 tablet 0  . fexofenadine (ALLEGRA) 180 MG tablet Take 1 tablet (180 mg total) by mouth daily. 90 tablet 1  . gabapentin (NEURONTIN) 100 MG capsule Take 100 mg by mouth 3 (three) times daily.    [name] Kitchen guaiFENesin (MUCINEX) 600 MG 12 hr tablet Take 1 tablet (600 mg total) by mouth 2 (two) times daily. 20 tablet 0  . Homeopathic Products (ARNICARE ARTHRITIS) TBDP Take by mouth.    [name] Kitchen ibuprofen (ADVIL,MOTRIN) 200 MG tablet Take 200 mg by mouth every 6 (six) hours as needed.    . metFORMIN (GLUCOPHAGE) 500 MG tablet TAKE 1 TABLET BY MOUTH 2 TIMES DAILY WITH A MEAL 60 tablet 0  . montelukast (SINGULAIR) 10 MG tablet TAKE 1 TABLET BY MOUTH AT BEDTIME 90 tablet 0  . omeprazole (PRILOSEC) 20 MG capsule Take 20 mg by mouth daily.    . traMADol (ULTRAM) 50 MG tablet Take 50 mg by mouth every 6 (six) hours as needed.    [name] Kitchen VITAMIN D, CHOLECALCIFEROL, PO Take 50 mcg by mouth 2 (two) times daily.    [name] Kitchen zolpidem (AMBIEN) 5 MG tablet Take 10 mg by mouth at bedtime as needed.      No current facility-administered medications on file prior to visit.     PAST MEDICAL HISTORY: Past Medical History:  Diagnosis Date  . Asthma   . Chronic fatigue   . Chronic pain    back and hips  . Fibromyalgia   . High cholesterol   . Insomnia   . Prediabetes   . Scoliosis   . Sleep apnea     PAST SURGICAL HISTORY: Past Surgical History:  Procedure Laterality Date  . ABDOMINAL HYSTERECTOMY    . CESAREAN SECTION    . CHOLECYSTECTOMY    . SPINAL FUSION    . THYROIDECTOMY    . TONSILLECTOMY    . TUBAL LIGATION       SOCIAL HISTORY: Social History   Tobacco Use  . Smoking status: Former Smoker    Packs/day: 0.25    Years: 12.00    Pack years: 3.00    Types: Cigarettes    Quit date: 09/07/2004    Years since quitting: 14.7  . Smokeless tobacco: Never Used  Substance Use Topics  . Alcohol use: Yes    Comment: 4 times a year  . Drug use: No    FAMILY HISTORY: Family History  Problem Relation Age of Onset  . Cancer Maternal Uncle   . Heart disease Father   . High Cholesterol Father   .  Heart disease Paternal Grandfather   . Breast cancer Maternal Grandmother   . Rheum arthritis Mother   . Obesity Mother   . Rheum arthritis Maternal Grandfather   . Rheum arthritis Maternal Aunt     ROS: Review of Systems  Constitutional: Negative for weight loss.  Psychiatric/Behavioral: Positive for depression. Negative for suicidal ideas.    PHYSICAL EXAM: Blood pressure 122/70, pulse 90, temperature 97.9 F (36.6 C), temperature source Oral, height 5\' 1"  (1.549 m), weight 279 lb (126.6 kg), SpO2 95 %. Body mass index is 52.72 kg/m. Physical Exam Vitals signs reviewed.  Constitutional:      Appearance: Normal appearance. She is well-developed. She is obese.  Cardiovascular:     Rate and Rhythm: Normal rate.  Pulmonary:     Effort: Pulmonary effort is normal.  Musculoskeletal: Normal range of motion.  Skin:    General: Skin is warm and dry.  Neurological:     Mental Status: She is alert and oriented to person, place, and time.  Psychiatric:        Mood and Affect: Mood normal.        Behavior: Behavior normal.        Thought Content: Thought content does not include homicidal or suicidal ideation.     RECENT LABS AND TESTS: BMET    Component Value Date/Time   NA 138 08/29/2018 1443   K 4.2 08/29/2018 1443   CL 100 08/29/2018 1443   CO2 24 08/29/2018 1443   GLUCOSE 100 (H) 08/29/2018 1443   GLUCOSE 90 10/01/2017 1041   BUN 13 08/29/2018 1443   CREATININE 0.63 08/29/2018 1443    CREATININE 0.53 10/01/2017 1041   CALCIUM 9.5 08/29/2018 1443   GFRNONAA 109 08/29/2018 1443   GFRNONAA 116 10/01/2017 1041   GFRAA 126 08/29/2018 1443   GFRAA 135 10/01/2017 1041   Lab Results  Component Value Date   HGBA1C 5.6 08/29/2018   HGBA1C 5.8 (H) 04/27/2018   HGBA1C 5.8 (H) 12/07/2017   HGBA1C 6.0 (H) 10/01/2017   Lab Results  Component Value Date   INSULIN 29.5 (H) 08/29/2018   INSULIN 46.1 (H) 04/27/2018   INSULIN 20.1 12/07/2017   CBC    Component Value Date/Time   WBC 5.6 04/27/2018 1144   WBC 7.0 10/01/2017 1041   RBC 4.85 04/27/2018 1144   RBC 5.02 10/01/2017 1041   HGB 13.4 04/27/2018 1144   HCT 42.9 04/27/2018 1144   PLT 244 10/01/2017 1041   MCV 89 04/27/2018 1144   MCH 27.6 04/27/2018 1144   MCH 28.3 10/01/2017 1041   MCHC 31.2 (L) 04/27/2018 1144   MCHC 34.5 10/01/2017 1041   RDW 14.1 04/27/2018 1144   LYMPHSABS 1.3 04/27/2018 1144   EOSABS 0.1 04/27/2018 1144   BASOSABS 0.0 04/27/2018 1144   Iron/TIBC/Ferritin/ %Sat No results found for: IRON, TIBC, FERRITIN, IRONPCTSAT Lipid Panel     Component Value Date/Time   CHOL 151 08/29/2018 1443   TRIG 196 (H) 08/29/2018 1443   HDL 29 (L) 08/29/2018 1443   CHOLHDL 2.9 10/08/2017 1103   LDLCALC 83 08/29/2018 1443   LDLCALC 69 10/08/2017 1103   Hepatic Function Panel     Component Value Date/Time   PROT 6.9 08/29/2018 1443   ALBUMIN 4.3 08/29/2018 1443   AST 24 08/29/2018 1443   ALT 28 08/29/2018 1443   ALKPHOS 64 08/29/2018 1443   BILITOT 0.3 08/29/2018 1443      Component Value Date/Time   TSH 1.390 08/29/2018 1443  TSH 0.377 (L) 04/27/2018 1144   TSH 1.890 02/15/2018 1137      OBESITY BEHAVIORAL INTERVENTION VISIT  Today's visit was # 29  Starting weight: 292 lbs Starting date: 12/07/2017 Today's weight : 279 lbs Today's date: 05/18/2019 Total lbs lost to date: 13    05/18/2019  Height 5\' 1"  (1.549 m)  Weight 279 lb (126.6 kg)  BMI (Calculated) 52.74  BLOOD PRESSURE -  SYSTOLIC [id number]  BLOOD PRESSURE - DIASTOLIC 70   Body Fat % [id number] %  Total Body Water (lbs) 91.6 lbs    ASK: We discussed the diagnosis of obesity with [name] today and Chatara agreed to give [place] permission to discuss obesity behavioral modification therapy today.  ASSESS: Doreathea has the diagnosis of obesity and her BMI today is 52.74 [place] is in the action stage of change   ADVISE: Artia was educated on the multiple health risks of obesity as well as the benefit of weight loss to improve her health. She was advised of the need for long term treatment and the importance of lifestyle modifications to improve her current health and to decrease her risk of future health problems.  AGREE: Multiple dietary modification options and treatment options were discussed and  Cherie agreed to follow the recommendations documented in the above note.  ARRANGE: Bostynn was educated on the importance of frequent visits to treat obesity as outlined per CMS and USPSTF guidelines and agreed to schedule her next follow up appointment today.  I, Doreene Nest, am acting as transcriptionist for Dennard Nip, MD  I have reviewed the above documentation for accuracy and completeness, and I agree with the above. -Dennard Nip, MD

## 2019-05-31 ENCOUNTER — Other Ambulatory Visit: Payer: Self-pay | Admitting: Family Medicine

## 2019-05-31 ENCOUNTER — Other Ambulatory Visit (INDEPENDENT_AMBULATORY_CARE_PROVIDER_SITE_OTHER): Payer: Self-pay | Admitting: Family Medicine

## 2019-05-31 DIAGNOSIS — J309 Allergic rhinitis, unspecified: Secondary | ICD-10-CM

## 2019-05-31 DIAGNOSIS — E038 Other specified hypothyroidism: Secondary | ICD-10-CM

## 2019-05-31 DIAGNOSIS — F418 Other specified anxiety disorders: Secondary | ICD-10-CM

## 2019-06-01 ENCOUNTER — Ambulatory Visit (INDEPENDENT_AMBULATORY_CARE_PROVIDER_SITE_OTHER): Payer: Medicaid Other | Admitting: Family Medicine

## 2019-06-08 ENCOUNTER — Other Ambulatory Visit: Payer: Self-pay

## 2019-06-08 ENCOUNTER — Encounter (INDEPENDENT_AMBULATORY_CARE_PROVIDER_SITE_OTHER): Payer: Self-pay | Admitting: Family Medicine

## 2019-06-08 ENCOUNTER — Ambulatory Visit (INDEPENDENT_AMBULATORY_CARE_PROVIDER_SITE_OTHER): Payer: Medicaid Other | Admitting: Family Medicine

## 2019-06-08 VITALS — BP 108/76 | HR 89 | Temp 97.7°F | Ht 61.0 in | Wt 282.0 lb

## 2019-06-08 DIAGNOSIS — F418 Other specified anxiety disorders: Secondary | ICD-10-CM

## 2019-06-08 DIAGNOSIS — R7303 Prediabetes: Secondary | ICD-10-CM

## 2019-06-08 DIAGNOSIS — Z6841 Body Mass Index (BMI) 40.0 and over, adult: Secondary | ICD-10-CM | POA: Diagnosis not present

## 2019-06-08 MED ORDER — METFORMIN HCL 500 MG PO TABS: ORAL_TABLET | ORAL | 0 refills | Status: DC

## 2019-06-20 ENCOUNTER — Other Ambulatory Visit: Payer: Self-pay | Admitting: Family Medicine

## 2019-06-20 DIAGNOSIS — M797 Fibromyalgia: Secondary | ICD-10-CM

## 2019-06-20 NOTE — Telephone Encounter (Signed)
Requested medication (s) are due for refill today: yes  Requested medication (s) are on the active medication list: yes  Last refill:  03/06/2019  Future visit scheduled: no  Notes to clinic:  Patient needs labs    Requested Prescriptions  Pending Prescriptions Disp Refills   diclofenac (VOLTAREN) 75 MG EC tablet [Pharmacy Med Name: DICLOFENAC SODIUM 75 MG TAB] 90 tablet 0    Sig: TAKE 1 TABLET BY MOUTH ONCE A DAY AS NEEDED FOR MILD PAIN OR MODERATEPAIN     Analgesics:  NSAIDS Failed - 06/20/2019  3:37 PM      Failed - HGB in normal range and within 360 days    Hemoglobin  Date Value Ref Range Status  04/27/2018 13.4 11.1 - 15.9 g/dL Final         Passed - Cr in normal range and within 360 days    Creat  Date Value Ref Range Status  10/01/2017 0.53 0.50 - 1.10 mg/dL Final   Creatinine, Ser  Date Value Ref Range Status  08/29/2018 0.63 0.57 - 1.00 mg/dL Final         Passed - Patient is not pregnant      Passed - Valid encounter within last 12 months    Recent Outpatient Visits          2 months ago Acute pain of right wrist   [facility] Steele Sizer, MD   6 months ago Respiratory infection   [place], [place], [place]   8 months ago Well woman exam (no gynecological exam)   [place], FNP   11 months ago Vertigo   [place], NP   1 year ago Upper respiratory tract infection, unspecified type   [place], Astrid Divine, [place]

## 2019-06-22 ENCOUNTER — Encounter (INDEPENDENT_AMBULATORY_CARE_PROVIDER_SITE_OTHER): Payer: Self-pay | Admitting: Family Medicine

## 2019-06-22 ENCOUNTER — Telehealth (INDEPENDENT_AMBULATORY_CARE_PROVIDER_SITE_OTHER): Payer: Medicaid Other | Admitting: Family Medicine

## 2019-06-22 ENCOUNTER — Other Ambulatory Visit: Payer: Self-pay

## 2019-06-22 DIAGNOSIS — Z6841 Body Mass Index (BMI) 40.0 and over, adult: Secondary | ICD-10-CM

## 2019-06-22 DIAGNOSIS — R7303 Prediabetes: Secondary | ICD-10-CM | POA: Diagnosis not present

## 2019-06-22 DIAGNOSIS — R05 Cough: Secondary | ICD-10-CM | POA: Diagnosis not present

## 2019-06-22 DIAGNOSIS — R059 Cough, unspecified: Secondary | ICD-10-CM

## 2019-06-27 DIAGNOSIS — Z20828 Contact with and (suspected) exposure to other viral communicable diseases: Secondary | ICD-10-CM | POA: Diagnosis not present

## 2019-06-28 NOTE — Progress Notes (Signed)
Office: [phone]  /  Fax: [phone] TeleHealth Visit:  [name] has verbally consented to this TeleHealth visit today. The patient is located at home, the provider is located at the [employer] [facility] office. The participants in this visit include the listed provider and patient. [name] was unable to use realtime audiovisual technology today and the telehealth visit was conducted via telephone.   HPI:   Chief Complaint: OBESITY [name] is here to discuss her progress with her obesity treatment plan. She is on the keep a food journal with 1600 calories and 90 grams of protein daily and is following her eating plan approximately 10 % of the time. She states she is exercising 0 minutes 0 times per week. Litisha has not been feeling well and notes her motivation to make changes to lose weight has decreased. She is frustrated with herself as she feels she is gaining weight again.  We were unable to weigh the patient today for this TeleHealth visit. She feels as if she has gained weight since her last visit. She has lost 10 lbs since starting treatment with [place].  Pre-Diabetes [name] has a diagnosis of pre-diabetes based on her elevated Hgb A1c and was informed this puts her at greater risk of developing diabetes. She has been off track with her diet and increased simple carbohydrates. She notes increased polyphagia again. She continues to work on diet and exercise to decrease risk of diabetes. She denies nausea or hypoglycemia.  Cough [name] denies fever but she has had a cough, and sore throat and ear drainage for 1-2 days. She thinks it is a cold but is nervous it could be COVID19.  ASSESSMENT AND PLAN:  Prediabetes  Cough  Class 3 severe obesity with serious comorbidity and body mass index (BMI) of 50.0 to 59.9 in adult, unspecified obesity type [name])  PLAN:  Pre-Diabetes [name] will continue to work on weight loss, exercise, and decreasing simple carbohydrates in her diet to  help decrease the risk of diabetes. We dicussed metformin including benefits and risks. She was informed that eating too many simple carbohydrates or too many calories at one sitting increases the likelihood of GI side effects. She was educated on diet and how this affects her pre-diabetes. Harlee agrees to follow up with our clinic in 2 to 3 as directed to monitor her progress.  Cough [name] was informed of free COVID19 testing at [facility] and she agreed to get tested, and will continue to monitor. [name] agrees to follow up with our clinic in 2 to 3 weeks.  I spent > than 50% of the 25 minute visit on counseling as documented in the note.  Obesity [name] is currently in the action stage of change. As such, her goal is to continue with weight loss efforts She has agreed to portion control better and make smarter food choices, such as increase vegetables and decrease simple carbohydrates  [name] has been instructed to work up to a goal of 150 minutes of combined cardio and strengthening exercise per week for weight loss and overall health benefits. We discussed the following Behavioral Modification Strategies today: increasing lean protein intake, decreasing simple carbohydrates  and work on meal planning and easy cooking plans [name] is to work on journaling when her motivation is up, and she will continue to follow and support her efforts when she is ready.  [name] has agreed to follow up with our clinic in 2 to 3 weeks with myself. She was informed of  the importance of frequent follow up visits to maximize her success with intensive lifestyle modifications for her multiple health conditions.  ALLERGIES: Allergies  Allergen Reactions  . Erythromycin   . Zyrtec [Cetirizine Hcl]     Only allergic to Zyrtec D -     MEDICATIONS: Current Outpatient Medications on File Prior to Visit  Medication Sig Dispense Refill  . acetaminophen (TYLENOL) 325 MG tablet Take 650 mg by mouth every 6 (six) hours as  needed.    [name] Kitchen albuterol (PROVENTIL HFA;VENTOLIN HFA) 108 (90 Base) MCG/ACT inhaler Inhale 2 puffs into the lungs every 6 (six) hours as needed for wheezing or shortness of breath. 1 Inhaler 0  . albuterol (PROVENTIL) (2.5 MG/3ML) 0.083% nebulizer solution Take 3 mLs (2.5 mg total) by nebulization every 6 (six) hours as needed for wheezing or shortness of breath. 150 mL 1  . ARMOUR THYROID 120 MG tablet TAKE ONE TABLET BY MOUTH ONCE A DAY BEFORE BREAKFAST 90 tablet 0  . Azelastine-Fluticasone 137-50 MCG/ACT SUSP Place 1 spray into the nose daily.    . budesonide-formoterol (SYMBICORT) 80-4.5 MCG/ACT inhaler Inhale 2 puffs into the lungs 2 (two) times daily. 1 Inhaler 3  . buPROPion (WELLBUTRIN SR) 150 MG 12 hr tablet TAKE 1 TABLET BY MOUTH ONCE DAILY 30 tablet 0  . citalopram (CELEXA) 20 MG tablet Take 20 mg by mouth daily.      . clonazePAM (KLONOPIN) 0.5 MG tablet Take 0.5 mg by mouth 3 (three) times daily as needed for anxiety.    . cyclobenzaprine (FLEXERIL) 10 MG tablet Take 0.5-1 tablets (5-10 mg total) by mouth 3 (three) times daily as needed for muscle spasms. 30 tablet 0  . diclofenac (VOLTAREN) 75 MG EC tablet TAKE 1 TABLET BY MOUTH ONCE A DAY AS NEEDED FOR MILD PAIN OR MODERATEPAIN 90 tablet 0  . fexofenadine (ALLEGRA) 180 MG tablet Take 1 tablet (180 mg total) by mouth daily. 90 tablet 1  . gabapentin (NEURONTIN) 100 MG capsule Take 100 mg by mouth 3 (three) times daily.    [name] Kitchen guaiFENesin (MUCINEX) 600 MG 12 hr tablet Take 1 tablet (600 mg total) by mouth 2 (two) times daily. 20 tablet 0  . Homeopathic Products (ARNICARE ARTHRITIS) TBDP Take by mouth.    [name] Kitchen ibuprofen (ADVIL,MOTRIN) 200 MG tablet Take 200 mg by mouth every 6 (six) hours as needed.    . metFORMIN (GLUCOPHAGE) 500 MG tablet TAKE 1 TABLET BY MOUTH 2 TIMES DAILY WITH A MEAL 60 tablet 0  . montelukast (SINGULAIR) 10 MG tablet TAKE ONE TABLET BY MOUTH AT BEDTIME 90 tablet 0  . omeprazole (PRILOSEC) 20 MG capsule Take 20 mg by  mouth daily.    . traMADol (ULTRAM) 50 MG tablet Take 50 mg by mouth every 6 (six) hours as needed.    [name] Kitchen VITAMIN D, CHOLECALCIFEROL, PO Take 50 mcg by mouth 2 (two) times daily.    [name] Kitchen zolpidem (AMBIEN) 5 MG tablet Take 10 mg by mouth at bedtime as needed.      No current facility-administered medications on file prior to visit.     PAST MEDICAL HISTORY: Past Medical History:  Diagnosis Date  . Asthma   . Chronic fatigue   . Chronic pain    back and hips  . Fibromyalgia   . High cholesterol   . Insomnia   . Prediabetes   . Scoliosis   . Sleep apnea     PAST SURGICAL HISTORY: Past Surgical History:  Procedure Laterality Date  .  ABDOMINAL HYSTERECTOMY    . CESAREAN SECTION    . CHOLECYSTECTOMY    . SPINAL FUSION    . THYROIDECTOMY    . TONSILLECTOMY    . TUBAL LIGATION      SOCIAL HISTORY: Social History   Tobacco Use  . Smoking status: Former Smoker    Packs/day: 0.25    Years: 12.00    Pack years: 3.00    Types: Cigarettes    Quit date: 09/07/2004    Years since quitting: 14.8  . Smokeless tobacco: Never Used  Substance Use Topics  . Alcohol use: Yes    Comment: 4 times a year  . Drug use: No    FAMILY HISTORY: Family History  Problem Relation Age of Onset  . Cancer Maternal Uncle   . Heart disease Father   . High Cholesterol Father   . Heart disease Paternal Grandfather   . Breast cancer Maternal Grandmother   . Rheum arthritis Mother   . Obesity Mother   . Rheum arthritis Maternal Grandfather   . Rheum arthritis Maternal Aunt     ROS: Review of Systems  Constitutional: Negative for weight loss.  HENT: Positive for ear discharge and sore throat.   Respiratory: Positive for cough.   Gastrointestinal: Negative for nausea.  Endo/Heme/Allergies:       Negative hypoglycemia Positive polyphagia    PHYSICAL EXAM: Pt in no acute distress  RECENT LABS AND TESTS: BMET    Component Value Date/Time   NA 138 08/29/2018 1443   K 4.2 08/29/2018 1443    CL 100 08/29/2018 1443   CO2 24 08/29/2018 1443   GLUCOSE 100 (H) 08/29/2018 1443   GLUCOSE 90 10/01/2017 1041   BUN 13 08/29/2018 1443   CREATININE 0.63 08/29/2018 1443   CREATININE 0.53 10/01/2017 1041   CALCIUM 9.5 08/29/2018 1443   GFRNONAA 109 08/29/2018 1443   GFRNONAA 116 10/01/2017 1041   GFRAA 126 08/29/2018 1443   GFRAA 135 10/01/2017 1041   Lab Results  Component Value Date   HGBA1C 5.6 08/29/2018   HGBA1C 5.8 (H) 04/27/2018   HGBA1C 5.8 (H) 12/07/2017   HGBA1C 6.0 (H) 10/01/2017   Lab Results  Component Value Date   INSULIN 29.5 (H) 08/29/2018   INSULIN 46.1 (H) 04/27/2018   INSULIN 20.1 12/07/2017   CBC    Component Value Date/Time   WBC 5.6 04/27/2018 1144   WBC 7.0 10/01/2017 1041   RBC 4.85 04/27/2018 1144   RBC 5.02 10/01/2017 1041   HGB 13.4 04/27/2018 1144   HCT 42.9 04/27/2018 1144   PLT 244 10/01/2017 1041   MCV 89 04/27/2018 1144   MCH 27.6 04/27/2018 1144   MCH 28.3 10/01/2017 1041   MCHC 31.2 (L) 04/27/2018 1144   MCHC 34.5 10/01/2017 1041   RDW 14.1 04/27/2018 1144   LYMPHSABS 1.3 04/27/2018 1144   EOSABS 0.1 04/27/2018 1144   BASOSABS 0.0 04/27/2018 1144   Iron/TIBC/Ferritin/ %Sat No results found for: IRON, TIBC, FERRITIN, IRONPCTSAT Lipid Panel     Component Value Date/Time   CHOL 151 08/29/2018 1443   TRIG 196 (H) 08/29/2018 1443   HDL 29 (L) 08/29/2018 1443   CHOLHDL 2.9 10/08/2017 1103   LDLCALC 83 08/29/2018 1443   LDLCALC 69 10/08/2017 1103   Hepatic Function Panel     Component Value Date/Time   PROT 6.9 08/29/2018 1443   ALBUMIN 4.3 08/29/2018 1443   AST 24 08/29/2018 1443   ALT 28 08/29/2018 1443   ALKPHOS  64 08/29/2018 1443   BILITOT 0.3 08/29/2018 1443      Component Value Date/Time   TSH 1.390 08/29/2018 1443   TSH 0.377 (L) 04/27/2018 1144   TSH 1.890 02/15/2018 1137      I, Trixie Dredge, am acting as transcriptionist for Dennard Nip, MD I have reviewed the above documentation for accuracy and  completeness, and I agree with the above. -Dennard Nip, MD

## 2019-07-03 NOTE — Progress Notes (Signed)
Office: [phone]  /  Fax: [phone]   HPI:   Chief Complaint: OBESITY [name] is here to discuss her progress with her obesity treatment plan. She is keeping a food journal with 1600 calories and 90 grams of protein and is following her eating plan approximately 0 % of the time. She states she is exercising 0 minutes 0 times per week. [name] has been off track with journaling and has not been working on weight loss due to multiple stressors. [name] states that she is ready to get back on track now.  Her weight is 282 lb (127.9 kg) today and has had a weight gain of 3 pounds over a period of 4 weeks since her last visit. She has lost 10 lbs since starting treatment with [place].  Pre-Diabetes [name] has a diagnosis of pre-diabetes based on her elevated Hgb A1c and was informed this puts her at greater risk of developing diabetes. She is stable on metformin currently and her last A1c was improved with diet and weight loss. [name] continues to work on diet and exercise to decrease risk of diabetes. She denies nausea, vomiting, or hypoglycemia.   Depression with Anxiety [name] is on Celexa, Wellbutrin, and Klonopin, but her mood has decreased in the last few weeks. Her motivation to improve her health has decreased, as she is not sleeping well, and she notes increased irritability. She has been working on behavior modification techniques to help reduce her emotional eating and has been somewhat successful. She shows no sign of suicidal or homicidal ideations.  Depression screen [facility] 2/9 03/28/2019 12/01/2018 09/30/2018 06/09/2018 05/13/2018  Decreased Interest 1 1 1 3 2   Down, Depressed, Hopeless 1 1 2 2 1   PHQ - 2 Score 2 2 3 5 3   Altered sleeping 2 2 3 3 1   Tired, decreased energy 2 2 3 3 1   Change in appetite 0 1 0 0 1  Feeling bad or failure about yourself  1 1 2  0 1  Trouble concentrating 1 0 2 2 1   Moving slowly or fidgety/restless 0 1 0 0 0  Suicidal thoughts 0 0 0 0 0  PHQ-9 Score 8 9 13 13 8    Difficult doing work/chores Very difficult Somewhat difficult - Somewhat difficult Somewhat difficult  Some recent data might be hidden    ASSESSMENT AND PLAN:  Prediabetes - Plan: metFORMIN (GLUCOPHAGE) 500 MG tablet  Depression with anxiety - Plan: Ambulatory referral to Psychology  Class 3 severe obesity with serious comorbidity and body mass index (BMI) of 50.0 to 59.9 in adult, unspecified obesity type ([place])  PLAN:  Pre-Diabetes [name] will continue to work on weight loss, exercise, and decreasing simple carbohydrates in her diet to help decrease the risk of diabetes. She was informed that eating too many simple carbohydrates or too many calories at one sitting increases the likelihood of GI side effects. [name] agreed to continue metformin 500 mg BID #60 with no refills and a prescription was written today. [name] agreed to follow up with [place] as directed to monitor her progress in 2 to 3 weeks.   Depression with Anxiety We discussed behavior modification techniques today to help [name] deal with her emotional eating and depression. We will refer to behavioral health for therapy. [name] agreed to follow up as directed.  Obesity [name] is currently in the action stage of change. As such, her goal is to continue with weight loss efforts. She has agreed to keep a food journal with 1600 calories and  grams of protein.  [name] has been instructed to work up to a goal of 150 minutes of combined cardio and strengthening exercise per week for weight loss and overall health benefits. We discussed the following Behavioral Modification Strategies today: work on meal planning and easy cooking plans and keep a strict food journal.  [name] has agreed to follow up with our clinic in 2 to 3 weeks. She was informed of the importance of frequent follow up visits to maximize her success with intensive lifestyle modifications for her multiple health conditions.  ALLERGIES: Allergies  Allergen Reactions  .  Erythromycin   . Zyrtec [Cetirizine Hcl]     Only allergic to Zyrtec D -     MEDICATIONS: Current Outpatient Medications on File Prior to Visit  Medication Sig Dispense Refill  . acetaminophen (TYLENOL) 325 MG tablet Take 650 mg by mouth every 6 (six) hours as needed.    [name] Kitchen albuterol (PROVENTIL HFA;VENTOLIN HFA) 108 (90 Base) MCG/ACT inhaler Inhale 2 puffs into the lungs every 6 (six) hours as needed for wheezing or shortness of breath. 1 Inhaler 0  . albuterol (PROVENTIL) (2.5 MG/3ML) 0.083% nebulizer solution Take 3 mLs (2.5 mg total) by nebulization every 6 (six) hours as needed for wheezing or shortness of breath. 150 mL 1  . ARMOUR THYROID 120 MG tablet TAKE ONE TABLET BY MOUTH ONCE A DAY BEFORE BREAKFAST 90 tablet 0  . Azelastine-Fluticasone 137-50 MCG/ACT SUSP Place 1 spray into the nose daily.    . budesonide-formoterol (SYMBICORT) 80-4.5 MCG/ACT inhaler Inhale 2 puffs into the lungs 2 (two) times daily. 1 Inhaler 3  . buPROPion (WELLBUTRIN SR) 150 MG 12 hr tablet TAKE 1 TABLET BY MOUTH ONCE DAILY 30 tablet 0  . citalopram (CELEXA) 20 MG tablet Take 20 mg by mouth daily.      . clonazePAM (KLONOPIN) 0.5 MG tablet Take 0.5 mg by mouth 3 (three) times daily as needed for anxiety.    . cyclobenzaprine (FLEXERIL) 10 MG tablet Take 0.5-1 tablets (5-10 mg total) by mouth 3 (three) times daily as needed for muscle spasms. 30 tablet 0  . fexofenadine (ALLEGRA) 180 MG tablet Take 1 tablet (180 mg total) by mouth daily. 90 tablet 1  . gabapentin (NEURONTIN) 100 MG capsule Take 100 mg by mouth 3 (three) times daily.    [name] Kitchen guaiFENesin (MUCINEX) 600 MG 12 hr tablet Take 1 tablet (600 mg total) by mouth 2 (two) times daily. 20 tablet 0  . Homeopathic Products (ARNICARE ARTHRITIS) TBDP Take by mouth.    [name] Kitchen ibuprofen (ADVIL,MOTRIN) 200 MG tablet Take 200 mg by mouth every 6 (six) hours as needed.    . montelukast (SINGULAIR) 10 MG tablet TAKE ONE TABLET BY MOUTH AT BEDTIME 90 tablet 0  . omeprazole  (PRILOSEC) 20 MG capsule Take 20 mg by mouth daily.    . traMADol (ULTRAM) 50 MG tablet Take 50 mg by mouth every 6 (six) hours as needed.    [name] Kitchen VITAMIN D, CHOLECALCIFEROL, PO Take 50 mcg by mouth 2 (two) times daily.    [name] Kitchen zolpidem (AMBIEN) 5 MG tablet Take 10 mg by mouth at bedtime as needed.      No current facility-administered medications on file prior to visit.     PAST MEDICAL HISTORY: Past Medical History:  Diagnosis Date  . Asthma   . Chronic fatigue   . Chronic pain    back and hips  . Fibromyalgia   . High cholesterol   . Insomnia   .  Prediabetes   . Scoliosis   . Sleep apnea     PAST SURGICAL HISTORY: Past Surgical History:  Procedure Laterality Date  . ABDOMINAL HYSTERECTOMY    . CESAREAN SECTION    . CHOLECYSTECTOMY    . SPINAL FUSION    . THYROIDECTOMY    . TONSILLECTOMY    . TUBAL LIGATION      SOCIAL HISTORY: Social History   Tobacco Use  . Smoking status: Former Smoker    Packs/day: 0.25    Years: 12.00    Pack years: 3.00    Types: Cigarettes    Quit date: 09/07/2004    Years since quitting: 14.8  . Smokeless tobacco: Never Used  Substance Use Topics  . Alcohol use: Yes    Comment: 4 times a year  . Drug use: No    FAMILY HISTORY: Family History  Problem Relation Age of Onset  . Cancer Maternal Uncle   . Heart disease Father   . High Cholesterol Father   . Heart disease Paternal Grandfather   . Breast cancer Maternal Grandmother   . Rheum arthritis Mother   . Obesity Mother   . Rheum arthritis Maternal Grandfather   . Rheum arthritis Maternal Aunt     ROS: Review of Systems  Constitutional: Negative for weight loss.  Psychiatric/Behavioral: Positive for depression. Negative for suicidal ideas. The patient is nervous/anxious and has insomnia.        Negative for homicidal ideations.    PHYSICAL EXAM: Blood pressure 108/76, pulse 89, temperature 97.7 F (36.5 C), temperature source Oral, height 5\' 1"  (1.549 m), weight 282 lb  (127.9 kg), SpO2 96 %. Body mass index is 53.28 kg/m. Physical Exam Vitals signs reviewed.  Constitutional:      Appearance: Normal appearance. She is obese.  Cardiovascular:     Rate and Rhythm: Normal rate.  Pulmonary:     Effort: Pulmonary effort is normal.  Musculoskeletal: Normal range of motion.  Skin:    General: Skin is warm and dry.  Neurological:     Mental Status: She is alert and oriented to person, place, and time.  Psychiatric:        Mood and Affect: Mood normal.        Behavior: Behavior normal.     RECENT LABS AND TESTS: BMET    Component Value Date/Time   NA 138 08/29/2018 1443   K 4.2 08/29/2018 1443   CL 100 08/29/2018 1443   CO2 24 08/29/2018 1443   GLUCOSE 100 (H) 08/29/2018 1443   GLUCOSE 90 10/01/2017 1041   BUN 13 08/29/2018 1443   CREATININE 0.63 08/29/2018 1443   CREATININE 0.53 10/01/2017 1041   CALCIUM 9.5 08/29/2018 1443   GFRNONAA 109 08/29/2018 1443   GFRNONAA 116 10/01/2017 1041   GFRAA 126 08/29/2018 1443   GFRAA 135 10/01/2017 1041   Lab Results  Component Value Date   HGBA1C 5.6 08/29/2018   HGBA1C 5.8 (H) 04/27/2018   HGBA1C 5.8 (H) 12/07/2017   HGBA1C 6.0 (H) 10/01/2017   Lab Results  Component Value Date   INSULIN 29.5 (H) 08/29/2018   INSULIN 46.1 (H) 04/27/2018   INSULIN 20.1 12/07/2017   CBC    Component Value Date/Time   WBC 5.6 04/27/2018 1144   WBC 7.0 10/01/2017 1041   RBC 4.85 04/27/2018 1144   RBC 5.02 10/01/2017 1041   HGB 13.4 04/27/2018 1144   HCT 42.9 04/27/2018 1144   PLT 244 10/01/2017 1041   MCV 89  04/27/2018 1144   MCH 27.6 04/27/2018 1144   MCH 28.3 10/01/2017 1041   MCHC 31.2 (L) 04/27/2018 1144   MCHC 34.5 10/01/2017 1041   RDW 14.1 04/27/2018 1144   LYMPHSABS 1.3 04/27/2018 1144   EOSABS 0.1 04/27/2018 1144   BASOSABS 0.0 04/27/2018 1144   Iron/TIBC/Ferritin/ %Sat No results found for: IRON, TIBC, FERRITIN, IRONPCTSAT Lipid Panel     Component Value Date/Time   CHOL 151  08/29/2018 1443   TRIG 196 (H) 08/29/2018 1443   HDL 29 (L) 08/29/2018 1443   CHOLHDL 2.9 10/08/2017 1103   LDLCALC 83 08/29/2018 1443   LDLCALC 69 10/08/2017 1103   Hepatic Function Panel     Component Value Date/Time   PROT 6.9 08/29/2018 1443   ALBUMIN 4.3 08/29/2018 1443   AST 24 08/29/2018 1443   ALT 28 08/29/2018 1443   ALKPHOS 64 08/29/2018 1443   BILITOT 0.3 08/29/2018 1443      Component Value Date/Time   TSH 1.390 08/29/2018 1443   TSH 0.377 (L) 04/27/2018 1144   TSH 1.890 02/15/2018 1137   Results for Deller, Eriel S "Idamay Bracewell" (MRN [id number]) as of 07/03/2019 10:09  Ref. Range 08/29/2018 14:43  Vitamin D, 25-Hydroxy Latest Ref Range: 30.0 - 100.0 ng/mL 46.1     OBESITY BEHAVIORAL INTERVENTION VISIT  Today's visit was # 30  Starting weight: 292 lbs   Starting date: 12/07/2017 Today's weight : Weight: 282 lb (127.9 kg)  Today's date: 06/08/2019 Total lbs lost to date: 10    06/08/2019  Height 5\' 1"  (1.549 m)  Weight 282 lb (127.9 kg)  BMI (Calculated) 53.31  BLOOD PRESSURE - SYSTOLIC [id number]  BLOOD PRESSURE - DIASTOLIC 76   Body Fat % 123456 %  Total Body Water (lbs) 92.8 lbs    ASK: We discussed the diagnosis of obesity with [name] today and [name] agreed to give [place] permission to discuss obesity behavioral modification therapy today.  ASSESS: [name] has the diagnosis of obesity and her BMI today is 53.31. [name] is in the action stage of change.   ADVISE: [name] was educated on the multiple health risks of obesity as well as the benefit of weight loss to improve her health. She was advised of the need for long term treatment and the importance of lifestyle modifications to improve her current health and to decrease her risk of future health problems.  AGREE: Multiple dietary modification options and treatment options were discussed and [name] agreed to follow the recommendations documented in the above note.  ARRANGE: [name] was educated on the  importance of frequent visits to treat obesity as outlined per CMS and USPSTF guidelines and agreed to schedule her next follow up appointment today.  IMarcille Mejia, CMA, am acting as transcriptionist for Starlyn Skeans, MD  I have reviewed the above documentation for accuracy and completeness, and I agree with the above. -Dennard Nip, MD

## 2019-07-16 ENCOUNTER — Encounter (INDEPENDENT_AMBULATORY_CARE_PROVIDER_SITE_OTHER): Payer: Self-pay | Admitting: Family Medicine

## 2019-07-17 ENCOUNTER — Telehealth (INDEPENDENT_AMBULATORY_CARE_PROVIDER_SITE_OTHER): Payer: Medicaid Other | Admitting: Family Medicine

## 2019-07-17 ENCOUNTER — Other Ambulatory Visit (INDEPENDENT_AMBULATORY_CARE_PROVIDER_SITE_OTHER): Payer: Self-pay | Admitting: Family Medicine

## 2019-07-17 ENCOUNTER — Other Ambulatory Visit: Payer: Self-pay

## 2019-07-17 ENCOUNTER — Encounter (INDEPENDENT_AMBULATORY_CARE_PROVIDER_SITE_OTHER): Payer: Self-pay | Admitting: Family Medicine

## 2019-07-17 DIAGNOSIS — R7303 Prediabetes: Secondary | ICD-10-CM

## 2019-07-17 DIAGNOSIS — Z6841 Body Mass Index (BMI) 40.0 and over, adult: Secondary | ICD-10-CM | POA: Diagnosis not present

## 2019-07-17 DIAGNOSIS — J329 Chronic sinusitis, unspecified: Secondary | ICD-10-CM

## 2019-07-17 DIAGNOSIS — F418 Other specified anxiety disorders: Secondary | ICD-10-CM

## 2019-07-17 MED ORDER — METFORMIN HCL 500 MG PO TABS: ORAL_TABLET | ORAL | 0 refills | Status: DC

## 2019-07-17 MED ORDER — BUPROPION HCL ER (SR) 150 MG PO TB12: 150.0000 mg | ORAL_TABLET | Freq: Every day | ORAL | 0 refills | Status: DC

## 2019-07-17 NOTE — Progress Notes (Signed)
Office: [phone]  /  Fax: [phone] TeleHealth Visit:  MARYSE NOFSINGER has verbally consented to this TeleHealth visit today. The patient is located at home, the provider is located at the [employer] [facility] office. The participants in this visit include the listed provider and patient. Sequana was unable to use realtime audiovisual technology today and the telehealth visit was conducted via telephone.   HPI:   Chief Complaint: OBESITY Rindi is here to discuss her progress with her obesity treatment plan. She is on the portion control better and make smarter food choices, such as increase vegetables and decrease simple carbohydrates and is following her eating plan approximately 20 % of the time. She states she is exercising 0 minutes 0 times per week. Cierria hasn't been feeling well and hasn't journaled much since our last visit. She thinks she may have gained weight, but isn't since. Her visit was changed to telehealth due to recent fever. We were unable to weigh the patient today for this TeleHealth visit. She feels as if she has gained weight since her last visit. She has lost 10 lbs since starting treatment with [place].  Pre-Diabetes Kamrin has a diagnosis of pre-diabetes based on her elevated Hgb A1c and was informed this puts her at greater risk of developing diabetes. She is working on diet, but has still increased simple carbohydrate. She notes polyphagia. She didn't get labs due to being sick. She is taking metformin currently and continues to work on diet and exercise to decrease risk of diabetes. She denies hypoglycemia.  Sinusitis Jemima notes increased frontal pain and congestion, difficulty breathing, and tickling cough for 3 days. She states Zyrtec D caused PVC's. She ran a temperature at home yesterday of 100.7. she was tested for COVID19 2 weeks ago (negative).  Depression with Emotional Eating Behaviors Raesha's mood is stable on Wellbutrin. She struggles with emotional  eating, but is doing better overall. She denies palpitations. Kyasha struggles with emotional eating and using food for comfort to the extent that it is negatively impacting her health. She often snacks when she is not hungry. Avigayil sometimes feels she is out of control and then feels guilty that she made poor food choices. She has been working on behavior modification techniques to help reduce her emotional eating and has been somewhat successful. She shows no sign of suicidal or homicidal ideations.  Depression screen [facility] 2/9 03/28/2019 12/01/2018 09/30/2018 06/09/2018 05/13/2018  Decreased Interest 1 1 1 3 2   Down, Depressed, Hopeless 1 1 2 2 1   PHQ - 2 Score 2 2 3 5 3   Altered sleeping 2 2 3 3 1   Tired, decreased energy 2 2 3 3 1   Change in appetite 0 1 0 0 1  Feeling bad or failure about yourself  1 1 2  0 1  Trouble concentrating 1 0 2 2 1   Moving slowly or fidgety/restless 0 1 0 0 0  Suicidal thoughts 0 0 0 0 0  PHQ-9 Score 8 9 13 13 8   Difficult doing work/chores Very difficult Somewhat difficult - Somewhat difficult Somewhat difficult  Some recent data might be hidden    ASSESSMENT AND PLAN:  Prediabetes - Plan: metFORMIN (GLUCOPHAGE) 500 MG tablet  Other sinusitis, unspecified chronicity  Depression with anxiety - Plan: buPROPion (WELLBUTRIN SR) 150 MG 12 hr tablet  Class 3 severe obesity with serious comorbidity and body mass index (BMI) of 50.0 to 59.9 in adult, unspecified obesity type ([place])  PLAN:  Pre-Diabetes Omia will  continue to work on weight loss, diet, exercise, and decreasing simple carbohydrates in her diet to help decrease the risk of diabetes. We dicussed metformin including benefits and risks. She was informed that eating too many simple carbohydrates or too many calories at one sitting increases the likelihood of GI side effects. Karista agrees to continue taking metformin 500 mg PO BID #60 and we will refill for 1 month. She is to began fasting to get labs at her  next visit. Amileah agrees to follow up with our clinic in 3 weeks as directed to monitor her progress.  Sinusitis Cathye was advised she can be tested again, but this is likely a viral sinusitis, no antibiotics needed to treat symptoms. She is to contact her primary care physician if no improvement or temp increase, and she agrees. Zobia agrees to follow up with our clinic in 3 weeks.  Depression with Emotional Eating Behaviors We discussed behavior modification techniques today to help Hilaree deal with her emotional eating and depression. Elvis agrees to continue taking Wellbutrin SR 150 mg PO q daily #30 and we will refill for 1 month. Tynasia agrees to follow up with our clinic in 3 weeks.  I spent > than 50% of the 25 minute visit on counseling as documented in the note.  Obesity Delta is currently in the action stage of change. As such, her goal is to continue with weight loss efforts She has agreed to portion control better and make smarter food choices, such as increase vegetables and decrease simple carbohydrates   Tobitha is to work on increasing H20 intake, and not skip meals until she feels well enough to get back to journaling. Lindalou has been instructed to work up to a goal of 150 minutes of combined cardio and strengthening exercise per week for weight loss and overall health benefits. We discussed the following Behavioral Modification Strategies today: decreasing simple carbohydrates, increase H20 intake, holiday eating strategies, and travel eating strategies   Engie has agreed to follow up with our clinic in 3 weeks with myself. She was informed of the importance of frequent follow up visits to maximize her success with intensive lifestyle modifications for her multiple health conditions.  ALLERGIES: Allergies  Allergen Reactions  . Erythromycin   . Zyrtec [Cetirizine Hcl]     Only allergic to Zyrtec D -     MEDICATIONS: Current Outpatient Medications on File Prior to Visit   Medication Sig Dispense Refill  . acetaminophen (TYLENOL) 325 MG tablet Take 650 mg by mouth every 6 (six) hours as needed.    [name] Kitchen albuterol (PROVENTIL HFA;VENTOLIN HFA) 108 (90 Base) MCG/ACT inhaler Inhale 2 puffs into the lungs every 6 (six) hours as needed for wheezing or shortness of breath. 1 Inhaler 0  . albuterol (PROVENTIL) (2.5 MG/3ML) 0.083% nebulizer solution Take 3 mLs (2.5 mg total) by nebulization every 6 (six) hours as needed for wheezing or shortness of breath. 150 mL 1  . ARMOUR THYROID 120 MG tablet TAKE ONE TABLET BY MOUTH ONCE A DAY BEFORE BREAKFAST 90 tablet 0  . Azelastine-Fluticasone 137-50 MCG/ACT SUSP Place 1 spray into the nose daily.    . budesonide-formoterol (SYMBICORT) 80-4.5 MCG/ACT inhaler Inhale 2 puffs into the lungs 2 (two) times daily. 1 Inhaler 3  . citalopram (CELEXA) 20 MG tablet Take 20 mg by mouth daily.      . clonazePAM (KLONOPIN) 0.5 MG tablet Take 0.5 mg by mouth 3 (three) times daily as needed for anxiety.    [name] Kitchen  cyclobenzaprine (FLEXERIL) 10 MG tablet Take 0.5-1 tablets (5-10 mg total) by mouth 3 (three) times daily as needed for muscle spasms. 30 tablet 0  . diclofenac (VOLTAREN) 75 MG EC tablet TAKE 1 TABLET BY MOUTH ONCE A DAY AS NEEDED FOR MILD PAIN OR MODERATEPAIN 90 tablet 0  . fexofenadine (ALLEGRA) 180 MG tablet Take 1 tablet (180 mg total) by mouth daily. 90 tablet 1  . gabapentin (NEURONTIN) 100 MG capsule Take 100 mg by mouth 3 (three) times daily.    [name] Kitchen guaiFENesin (MUCINEX) 600 MG 12 hr tablet Take 1 tablet (600 mg total) by mouth 2 (two) times daily. 20 tablet 0  . Homeopathic Products (ARNICARE ARTHRITIS) TBDP Take by mouth.    [name] Kitchen ibuprofen (ADVIL,MOTRIN) 200 MG tablet Take 200 mg by mouth every 6 (six) hours as needed.    . montelukast (SINGULAIR) 10 MG tablet TAKE ONE TABLET BY MOUTH AT BEDTIME 90 tablet 0  . omeprazole (PRILOSEC) 20 MG capsule Take 20 mg by mouth daily.    . traMADol (ULTRAM) 50 MG tablet Take 50 mg by mouth every 6 (six)  hours as needed.    [name] Kitchen VITAMIN D, CHOLECALCIFEROL, PO Take 50 mcg by mouth 2 (two) times daily.    [name] Kitchen zolpidem (AMBIEN) 5 MG tablet Take 10 mg by mouth at bedtime as needed.      No current facility-administered medications on file prior to visit.     PAST MEDICAL HISTORY: Past Medical History:  Diagnosis Date  . Asthma   . Chronic fatigue   . Chronic pain    back and hips  . Fibromyalgia   . High cholesterol   . Insomnia   . Prediabetes   . Scoliosis   . Sleep apnea     PAST SURGICAL HISTORY: Past Surgical History:  Procedure Laterality Date  . ABDOMINAL HYSTERECTOMY    . CESAREAN SECTION    . CHOLECYSTECTOMY    . SPINAL FUSION    . THYROIDECTOMY    . TONSILLECTOMY    . TUBAL LIGATION      SOCIAL HISTORY: Social History   Tobacco Use  . Smoking status: Former Smoker    Packs/day: 0.25    Years: 12.00    Pack years: 3.00    Types: Cigarettes    Quit date: 09/07/2004    Years since quitting: 14.8  . Smokeless tobacco: Never Used  Substance Use Topics  . Alcohol use: Yes    Comment: 4 times a year  . Drug use: No    FAMILY HISTORY: Family History  Problem Relation Age of Onset  . Cancer Maternal Uncle   . Heart disease Father   . High Cholesterol Father   . Heart disease Paternal Grandfather   . Breast cancer Maternal Grandmother   . Rheum arthritis Mother   . Obesity Mother   . Rheum arthritis Maternal Grandfather   . Rheum arthritis Maternal Aunt     ROS: Review of Systems  Constitutional: Negative for weight loss.  HENT: Positive for congestion and sinus pain.   Respiratory: Positive for cough.   Endo/Heme/Allergies:       Positive polyphagia Negative hypoglycemia  Psychiatric/Behavioral: Positive for depression. Negative for suicidal ideas.    PHYSICAL EXAM: Pt in no acute distress  RECENT LABS AND TESTS: BMET    Component Value Date/Time   NA 138 08/29/2018 1443   K 4.2 08/29/2018 1443   CL 100 08/29/2018 1443   CO2 24 08/29/2018  1443  GLUCOSE 100 (H) 08/29/2018 1443   GLUCOSE 90 10/01/2017 1041   BUN 13 08/29/2018 1443   CREATININE 0.63 08/29/2018 1443   CREATININE 0.53 10/01/2017 1041   CALCIUM 9.5 08/29/2018 1443   GFRNONAA 109 08/29/2018 1443   GFRNONAA 116 10/01/2017 1041   GFRAA 126 08/29/2018 1443   GFRAA 135 10/01/2017 1041   Lab Results  Component Value Date   HGBA1C 5.6 08/29/2018   HGBA1C 5.8 (H) 04/27/2018   HGBA1C 5.8 (H) 12/07/2017   HGBA1C 6.0 (H) 10/01/2017   Lab Results  Component Value Date   INSULIN 29.5 (H) 08/29/2018   INSULIN 46.1 (H) 04/27/2018   INSULIN 20.1 12/07/2017   CBC    Component Value Date/Time   WBC 5.6 04/27/2018 1144   WBC 7.0 10/01/2017 1041   RBC 4.85 04/27/2018 1144   RBC 5.02 10/01/2017 1041   HGB 13.4 04/27/2018 1144   HCT 42.9 04/27/2018 1144   PLT 244 10/01/2017 1041   MCV 89 04/27/2018 1144   MCH 27.6 04/27/2018 1144   MCH 28.3 10/01/2017 1041   MCHC 31.2 (L) 04/27/2018 1144   MCHC 34.5 10/01/2017 1041   RDW 14.1 04/27/2018 1144   LYMPHSABS 1.3 04/27/2018 1144   EOSABS 0.1 04/27/2018 1144   BASOSABS 0.0 04/27/2018 1144   Iron/TIBC/Ferritin/ %Sat No results found for: IRON, TIBC, FERRITIN, IRONPCTSAT Lipid Panel     Component Value Date/Time   CHOL 151 08/29/2018 1443   TRIG 196 (H) 08/29/2018 1443   HDL 29 (L) 08/29/2018 1443   CHOLHDL 2.9 10/08/2017 1103   LDLCALC 83 08/29/2018 1443   LDLCALC 69 10/08/2017 1103   Hepatic Function Panel     Component Value Date/Time   PROT 6.9 08/29/2018 1443   ALBUMIN 4.3 08/29/2018 1443   AST 24 08/29/2018 1443   ALT 28 08/29/2018 1443   ALKPHOS 64 08/29/2018 1443   BILITOT 0.3 08/29/2018 1443      Component Value Date/Time   TSH 1.390 08/29/2018 1443   TSH 0.377 (L) 04/27/2018 1144   TSH 1.890 02/15/2018 1137      I, Trixie Dredge, am acting as Location manager for Dennard Nip, MD I have reviewed the above documentation for accuracy and completeness, and I agree with the above.  -Dennard Nip, MD

## 2019-08-01 ENCOUNTER — Encounter: Payer: Self-pay | Admitting: Family Medicine

## 2019-08-01 ENCOUNTER — Ambulatory Visit (INDEPENDENT_AMBULATORY_CARE_PROVIDER_SITE_OTHER): Payer: Medicaid Other | Admitting: Family Medicine

## 2019-08-01 VITALS — Ht 61.0 in | Wt 280.0 lb

## 2019-08-01 DIAGNOSIS — M25512 Pain in left shoulder: Secondary | ICD-10-CM

## 2019-08-01 MED ORDER — PREDNISONE 20 MG PO TABS: 40.0000 mg | ORAL_TABLET | Freq: Every day | ORAL | 0 refills | Status: AC

## 2019-08-01 MED ORDER — CYCLOBENZAPRINE HCL 10 MG PO TABS: 5.0000 mg | ORAL_TABLET | Freq: Three times a day (TID) | ORAL | 0 refills | Status: DC | PRN

## 2019-08-01 NOTE — Progress Notes (Signed)
Name: [name]   MRN: [id number]    DOB: 1976/02/03   Date:08/01/2019       Progress Note  Subjective:    Chief Complaint  Chief Complaint  Patient presents with  . Shoulder Pain    pain started 2 weeks ago ,no injury, sharp/ throbbing ache.    I connected with  [name]  on 08/01/19 at  2:40 PM EST by a video enabled telemedicine application and verified that I am speaking with the correct person using two identifiers.  I discussed the limitations of evaluation and management by telemedicine and the availability of in person appointments. The patient expressed understanding and agreed to proceed. Staff also discussed with the patient that there may be a patient responsible charge related to this service. Patient Location: home Provider Location: [facility] clinic Additional Individuals present: none  Shoulder Pain  The pain is present in the left shoulder. This is a new problem. Episode onset: 2 weeks. There has been no history of extremity trauma. The problem occurs intermittently (gradual onset with left trapezius tension led to severe pain and limited ROM and it has recently improved ). The problem has been gradually worsening. The quality of the pain is described as sharp, burning and aching (shooting pain from shoulder to neck to upper arm, dull throbbing). Pain scale: pain today 2/10, but it was severe 10/10. The pain is severe. Associated symptoms include a limited range of motion and stiffness. Pertinent negatives include no fever, inability to bear weight, itching, joint locking, joint swelling, numbness or tingling. The symptoms are aggravated by activity. She has tried OTC pain meds, heat, rest and NSAIDS (muscle relaxer) for the symptoms. The treatment provided mild relief. Family history does not include gout or rheumatoid arthritis.   Pt has hx of fibromyalgia, no arthritis dx, has had negative testing Severe left shoulder pain with radiation to neck and upper arm,  trapezius tension and tightness, also loss of ROM of left shoudler, w/o injury   Patient Active Problem List   Diagnosis Date Noted  . Depression 02/21/2019  . Class 3 severe obesity with serious comorbidity and body mass index (BMI) of 50.0 to 59.9 in adult ([place]) 10/20/2018  . Osteopenia 09/30/2018  . Mild intermittent asthma with acute exacerbation 05/13/2018  . Allergic rhinitis 03/09/2018  . Tension headache 03/09/2018  . Other fatigue 12/07/2017  . Shortness of breath on exertion 12/07/2017  . Other specified hypothyroidism 12/07/2017  . Prediabetes 10/15/2017  . Gastroesophageal reflux disease without esophagitis 10/15/2017  . Nasal valve collapse 10/15/2017  . 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/01/2017  . Chronic midline back pain 10/01/2017  . Vitamin D deficiency 10/01/2017  . Anxiety 10/01/2017  . PCOS (polycystic ovarian syndrome) 10/01/2017  . Severe episode of recurrent major depressive disorder, without psychotic features ([place]) 10/01/2017  . BMI 50.0-59.9, adult ([place]) 10/01/2017  . Fibromyalgia 10/01/2017  . Postoperative hypothyroidism 10/01/2017  . OSA (obstructive sleep apnea) 02/26/2014    Social History   Tobacco Use  . Smoking status: Former Smoker    Packs/day: 0.25    Years: 12.00    Pack years: 3.00    Types: Cigarettes    Quit date: 09/07/2004    Years since quitting: 14.9  . Smokeless tobacco: Never Used  Substance Use Topics  . Alcohol use: Yes    Comment: 4 times a year     Current Outpatient Medications:  .  acetaminophen (TYLENOL) 325 MG tablet, Take 650 mg by  mouth every 6 (six) hours as needed., Disp: , Rfl:  .  albuterol (PROVENTIL HFA;VENTOLIN HFA) 108 (90 Base) MCG/ACT inhaler, Inhale 2 puffs into the lungs every 6 (six) hours as needed for wheezing or shortness of breath., Disp: 1 Inhaler, Rfl: 0 .  ARMOUR THYROID 120 MG tablet, TAKE ONE TABLET BY MOUTH ONCE A DAY BEFORE BREAKFAST, Disp: 90 tablet, Rfl: 0 .  Azelastine-Fluticasone  137-50 MCG/ACT SUSP, Place 1 spray into the nose daily., Disp: , Rfl:  .  budesonide-formoterol (SYMBICORT) 80-4.5 MCG/ACT inhaler, Inhale 2 puffs into the lungs 2 (two) times daily., Disp: 1 Inhaler, Rfl: 3 .  buPROPion (WELLBUTRIN SR) 150 MG 12 hr tablet, Take 1 tablet (150 mg total) by mouth daily., Disp: 30 tablet, Rfl: 0 .  citalopram (CELEXA) 20 MG tablet, Take 20 mg by mouth daily.  , Disp: , Rfl:  .  clonazePAM (KLONOPIN) 0.5 MG tablet, Take 0.5 mg by mouth 3 (three) times daily as needed for anxiety., Disp: , Rfl:  .  cyclobenzaprine (FLEXERIL) 10 MG tablet, Take 0.5-1 tablets (5-10 mg total) by mouth 3 (three) times daily as needed for muscle spasms., Disp: 30 tablet, Rfl: 0 .  diclofenac (VOLTAREN) 75 MG EC tablet, TAKE 1 TABLET BY MOUTH ONCE A DAY AS NEEDED FOR MILD PAIN OR MODERATEPAIN, Disp: 90 tablet, Rfl: 0 .  fexofenadine (ALLEGRA) 180 MG tablet, Take 1 tablet (180 mg total) by mouth daily., Disp: 90 tablet, Rfl: 1 .  gabapentin (NEURONTIN) 100 MG capsule, Take 100 mg by mouth 3 (three) times daily., Disp: , Rfl:  .  ibuprofen (ADVIL,MOTRIN) 200 MG tablet, Take 200 mg by mouth every 6 (six) hours as needed., Disp: , Rfl:  .  metFORMIN (GLUCOPHAGE) 500 MG tablet, TAKE 1 TABLET BY MOUTH 2 TIMES DAILY WITH A MEAL, Disp: 60 tablet, Rfl: 0 .  montelukast (SINGULAIR) 10 MG tablet, TAKE ONE TABLET BY MOUTH AT BEDTIME, Disp: 90 tablet, Rfl: 0 .  omeprazole (PRILOSEC) 20 MG capsule, Take 20 mg by mouth daily., Disp: , Rfl:  .  VITAMIN D, CHOLECALCIFEROL, PO, Take 50 mcg by mouth 2 (two) times daily., Disp: , Rfl:  .  zolpidem (AMBIEN) 5 MG tablet, Take 10 mg by mouth at bedtime as needed. , Disp: , Rfl:  .  albuterol (PROVENTIL) (2.5 MG/3ML) 0.083% nebulizer solution, Take 3 mLs (2.5 mg total) by nebulization every 6 (six) hours as needed for wheezing or shortness of breath. (Patient not taking: Reported on 08/01/2019), Disp: 150 mL, Rfl: 1 .  guaiFENesin (MUCINEX) 600 MG 12 hr tablet, Take 1  tablet (600 mg total) by mouth 2 (two) times daily. (Patient not taking: Reported on 08/01/2019), Disp: 20 tablet, Rfl: 0 .  Homeopathic Products (ARNICARE ARTHRITIS) TBDP, Take by mouth., Disp: , Rfl:  .  traMADol (ULTRAM) 50 MG tablet, Take 50 mg by mouth every 6 (six) hours as needed., Disp: , Rfl:   Allergies  Allergen Reactions  . Erythromycin   . Zyrtec [Cetirizine Hcl]     Only allergic to Zyrtec D -     I personally reviewed active problem list, medication list, allergies, lab results with the patient/caregiver today.  Review of Systems  Constitutional: Negative for fever.  Musculoskeletal: Positive for stiffness.  Skin: Negative for itching.  Neurological: Negative for tingling and numbness.     Objective:   Virtual encounter, vitals limited, only able to obtain the following Today's Vitals   08/01/19 1423 08/01/19 1424  Weight: 280 lb (  127 kg)   Height: 5\' 1"  (1.549 m)   PainSc:  7    Body mass index is 52.91 kg/m. Nursing Note and Vital Signs reviewed.  Physical Exam Vitals signs and nursing note reviewed.  Constitutional:      General: She is not in acute distress.    Appearance: She is well-developed. She is obese. She is not ill-appearing, toxic-appearing or diaphoretic.  HENT:     Head: Normocephalic and atraumatic.     Nose: Nose normal.  Eyes:     General:        Right eye: No discharge.        Left eye: No discharge.     Conjunctiva/sclera: Conjunctivae normal.  Neck:     Musculoskeletal: Full passive range of motion without pain, normal range of motion and neck supple.     Trachea: No tracheal deviation.     Comments: Grossly normal ROM visualized on virtual encounter Cardiovascular:     Rate and Rhythm: Normal rate and regular rhythm.  Pulmonary:     Effort: Pulmonary effort is normal. No respiratory distress.     Breath sounds: No stridor.  Musculoskeletal:     Left shoulder: She exhibits normal range of motion and no swelling.      Cervical back: Normal.  Skin:    General: Skin is warm and dry.     Findings: No erythema or rash.  Neurological:     Mental Status: She is alert.     Motor: No abnormal muscle tone.     Coordination: Coordination normal.  Psychiatric:        Behavior: Behavior normal.     PE limited by telephone encounter  No results found for this or any previous visit (from the past 72 hour(s)).  Assessment and Plan:     ICD-10-CM   1. Acute pain of left shoulder  M25.512 Ambulatory referral to Orthopedics    cyclobenzaprine (FLEXERIL) 10 MG tablet    predniSONE (DELTASONE) 20 MG tablet   good ROM on video encounter, no visible swelling, erythema or deformity, most areas she points to are muscle.  Discussed conservative tx with heat therapy, continuing her NSAIDs, tylenol, rest, gentle stretching for tight or stiff muscles.  She wanted Ortho referral, so it was put in for her.  We discussed increasing gabapentin or switching to Lyrica for muscle skeletal pain, discussed options of physical therapy.  Pain is improving currently 2 out of 10, may resolve on its own the patient did want a referral.  Did not feel imaging was indicated w/o injury and spontaneous onset - defer to ortho.       - I discussed the assessment and treatment plan with the patient. The patient was provided an opportunity to ask questions and all were answered. The patient agreed with the plan and demonstrated an understanding of the instructions.  I provided 21 minutes of non-face-to-face time during this encounter.  Delsa Grana, PA-C 08/01/19 3:29 PM

## 2019-08-07 ENCOUNTER — Encounter (INDEPENDENT_AMBULATORY_CARE_PROVIDER_SITE_OTHER): Payer: Self-pay | Admitting: Family Medicine

## 2019-08-07 ENCOUNTER — Other Ambulatory Visit: Payer: Self-pay

## 2019-08-07 ENCOUNTER — Ambulatory Visit (INDEPENDENT_AMBULATORY_CARE_PROVIDER_SITE_OTHER): Payer: Medicaid Other | Admitting: Family Medicine

## 2019-08-07 VITALS — BP 115/74 | HR 86 | Temp 97.6°F | Ht 61.0 in | Wt 281.0 lb

## 2019-08-07 DIAGNOSIS — E559 Vitamin D deficiency, unspecified: Secondary | ICD-10-CM | POA: Diagnosis not present

## 2019-08-07 DIAGNOSIS — F3289 Other specified depressive episodes: Secondary | ICD-10-CM

## 2019-08-07 DIAGNOSIS — E7849 Other hyperlipidemia: Secondary | ICD-10-CM | POA: Diagnosis not present

## 2019-08-07 DIAGNOSIS — E038 Other specified hypothyroidism: Secondary | ICD-10-CM

## 2019-08-07 DIAGNOSIS — Z6841 Body Mass Index (BMI) 40.0 and over, adult: Secondary | ICD-10-CM | POA: Diagnosis not present

## 2019-08-07 DIAGNOSIS — R7303 Prediabetes: Secondary | ICD-10-CM | POA: Diagnosis not present

## 2019-08-07 MED ORDER — BUPROPION HCL ER (SR) 150 MG PO TB12: 150.0000 mg | ORAL_TABLET | Freq: Every day | ORAL | 0 refills | Status: DC

## 2019-08-07 NOTE — Progress Notes (Signed)
Office: [phone]  /  Fax: [phone]   HPI:   Chief Complaint: OBESITY [name] is here to discuss her progress with her obesity treatment plan. She is journaling and is following her eating plan approximately 0% of the time. She states she is exercising 0 minutes 0 times per week. [name] has been trying to PC/[place]. She is not journaling, but states she did better with PC over Thanksgiving. She still struggles with skipping meals and not eating enough food, especially protein. Her weight is 281 lb (127.5 kg) today and has had a weight loss of 1 pound over a period of 9 weeks since her last visit. She has lost 11 lbs since starting treatment with [place].  Pre-Diabetes [name] has a diagnosis of prediabetes based on her elevated Hgb A1c and was informed this puts her at greater risk of developing diabetes. She is tolerating metformin well and continues to work on diet and exercise to decrease risk of diabetes. She denies hypoglycemia.  Hypothyroidism [name] has a diagnosis of hypothyroidism. She is on Armour Thyroid, which is prescribed by her PCP. She has had no recent labs. She denies palpitations or change in fatigue.  Depression  [name] is struggling with emotional eating and using food for comfort to the extent that it is negatively impacting her health. She often snacks when she is not hungry. [name] sometimes feels she is out of control and then feels guilty that she made poor food choices. She has been working on behavior modification techniques to help reduce her emotional eating and has been somewhat successful. [name] reports her mood is stable on Wellbutrin. She has not been working on meal planning. Blood pressure is stable. No tremors. She shows no sign of suicidal or homicidal ideations.  Depression screen [name]  Decreased Interest 0 1 1 1 3   Down, Depressed, Hopeless 0 1 1 2 2   PHQ - 2 Score 0 2 2 3 5   Altered sleeping 0 2 2 3 3    Tired, decreased energy 0 2 2 3 3   Change in appetite 0 0 1 0 0  Feeling bad or failure about yourself  0 1 1 2  0  Trouble concentrating 0 1 0 2 2  Moving slowly or fidgety/restless 0 0 1 0 0  Suicidal thoughts 0 0 0 0 0  PHQ-9 Score 0 8 9 13 13   Difficult doing work/chores Not difficult at all Very difficult Somewhat difficult - Somewhat difficult  Some recent data might be hidden   Vitamin D deficiency Anastazia has a diagnosis of Vitamin D deficiency. She is due to have Vitamin D level checked. She denies nausea, vomiting or muscle weakness.  Hyperlipidemia [name] has hyperlipidemia and has been trying to improve her cholesterol levels with intensive lifestyle modification including a low saturated fat diet, exercise and weight loss. She denies any chest pain. She is due to have labs checked.  ASSESSMENT AND PLAN:  Prediabetes - Plan: Comprehensive Metabolic Panel (CMET), HgB A1c, Insulin, random  Other specified hypothyroidism - Plan: T3, T4, free, TSH  Other hyperlipidemia - Plan: Lipid Panel With LDL/HDL Ratio  Vitamin D deficiency - Plan: Vitamin D (25 hydroxy)  Other depression, with emotional eating - Plan: buPROPion (WELLBUTRIN SR) 150 MG 12 hr tablet  Class 3 severe obesity with serious comorbidity and body mass index (BMI) of 50.0 to 59.9 in adult, unspecified obesity type [facility])  PLAN:  Pre-Diabetes Ainhara will continue to work on weight loss,  exercise, and decreasing simple carbohydrates in her diet to help decrease the risk of diabetes. We dicussed metformin including benefits and risks. She was informed that eating too many simple carbohydrates or too many calories at one sitting increases the likelihood of GI side effects. Quinette was given a refill on her metformin and agrees to follow-up with our clinic in 2 weeks. She will have labs checked and follow-up for review of lab results.  Hypothyroidism [name] was informed of the importance of good thyroid control to help with  weight loss efforts. She was also informed that supertheraputic thyroid levels are dangerous and will not improve weight loss results. Airyana will have labs checked and we will send the results to her PCP.  Depression  We discussed behavior modification techniques today to help [name] deal with her emotional eating and depression. Jowana was given a refill on her Wellbutrin and agrees to follow-up with our clinic in 2 weeks.  Vitamin D Deficiency [name] was informed that low Vitamin D levels contributes to fatigue and are associated with obesity, breast, and colon cancer. She will have routine testing of Vitamin D and follow-up with our clinic in 2 weeks.  Hyperlipidemia [name] was informed of the American Heart Association Guidelines emphasizing intensive lifestyle modifications as the first line treatment for hyperlipidemia. We discussed many lifestyle modifications today in depth, and Jaicee will continue to work on decreasing saturated fats such as fatty red meat, butter and many fried foods. Zalaya will have labs checked. She will also increase vegetables and lean protein in her diet and continue to work on exercise and weight loss efforts.  Obesity [name] is currently in the action stage of change. As such, her goal is to continue with weight loss efforts. She has agreed to portion control better and make smarter food choices, such as increase vegetables and decrease simple carbohydrates or journal 1200-1500 calories and 80+ grams of protein daily. [name] has been instructed to work up to a goal of 150 minutes of combined cardio and strengthening exercise per week for weight loss and overall health benefits. We discussed the following Behavioral Modification Strategies today: increasing lean protein intake, decreasing simple carbohydrates, no skipping meals, holiday eating strategies, and keep a strict food journal.  [name] has agreed to follow-up with our clinic in 2 weeks. She was informed of the  importance of frequent follow-up visits to maximize her success with intensive lifestyle modifications for her multiple health conditions.  ALLERGIES: Allergies  Allergen Reactions  . Erythromycin   . Zyrtec [Cetirizine Hcl]     Only allergic to Zyrtec D -     MEDICATIONS: Current Outpatient Medications on File Prior to Visit  Medication Sig Dispense Refill  . acetaminophen (TYLENOL) 325 MG tablet Take 650 mg by mouth every 6 (six) hours as needed.    [name] Kitchen albuterol (PROVENTIL HFA;VENTOLIN HFA) 108 (90 Base) MCG/ACT inhaler Inhale 2 puffs into the lungs every 6 (six) hours as needed for wheezing or shortness of breath. 1 Inhaler 0  . albuterol (PROVENTIL) (2.5 MG/3ML) 0.083% nebulizer solution Take 3 mLs (2.5 mg total) by nebulization every 6 (six) hours as needed for wheezing or shortness of breath. 150 mL 1  . ARMOUR THYROID 120 MG tablet TAKE ONE TABLET BY MOUTH ONCE A DAY BEFORE BREAKFAST 90 tablet 0  . Azelastine-Fluticasone 137-50 MCG/ACT SUSP Place 1 spray into the nose daily.    . budesonide-formoterol (SYMBICORT) 80-4.5 MCG/ACT inhaler Inhale 2 puffs into the lungs 2 (two) times daily.  1 Inhaler 3  . citalopram (CELEXA) 20 MG tablet Take 20 mg by mouth daily.      . clonazePAM (KLONOPIN) 0.5 MG tablet Take 0.5 mg by mouth 3 (three) times daily as needed for anxiety.    . cyclobenzaprine (FLEXERIL) 10 MG tablet Take 0.5-1 tablets (5-10 mg total) by mouth 3 (three) times daily as needed for muscle spasms. 30 tablet 0  . diclofenac (VOLTAREN) 75 MG EC tablet TAKE 1 TABLET BY MOUTH ONCE A DAY AS NEEDED FOR MILD PAIN OR MODERATEPAIN 90 tablet 0  . fexofenadine (ALLEGRA) 180 MG tablet Take 1 tablet (180 mg total) by mouth daily. 90 tablet 1  . gabapentin (NEURONTIN) 100 MG capsule Take 100 mg by mouth 3 (three) times daily.    [name] Kitchen guaiFENesin (MUCINEX) 600 MG 12 hr tablet Take 1 tablet (600 mg total) by mouth 2 (two) times daily. 20 tablet 0  . Homeopathic Products (ARNICARE ARTHRITIS) TBDP  Take by mouth.    [name] Kitchen ibuprofen (ADVIL,MOTRIN) 200 MG tablet Take 200 mg by mouth every 6 (six) hours as needed.    . metFORMIN (GLUCOPHAGE) 500 MG tablet TAKE 1 TABLET BY MOUTH 2 TIMES DAILY WITH A MEAL 60 tablet 0  . montelukast (SINGULAIR) 10 MG tablet TAKE ONE TABLET BY MOUTH AT BEDTIME 90 tablet 0  . omeprazole (PRILOSEC) 20 MG capsule Take 20 mg by mouth daily.    . traMADol (ULTRAM) 50 MG tablet Take 50 mg by mouth every 6 (six) hours as needed.    [name] Kitchen VITAMIN D, CHOLECALCIFEROL, PO Take 50 mcg by mouth 2 (two) times daily.    [name] Kitchen zolpidem (AMBIEN) 5 MG tablet Take 10 mg by mouth at bedtime as needed.      No current facility-administered medications on file prior to visit.     PAST MEDICAL HISTORY: Past Medical History:  Diagnosis Date  . Asthma   . Chronic fatigue   . Chronic pain    back and hips  . Fibromyalgia   . High cholesterol   . Insomnia   . Prediabetes   . Scoliosis   . Sleep apnea     PAST SURGICAL HISTORY: Past Surgical History:  Procedure Laterality Date  . ABDOMINAL HYSTERECTOMY    . CESAREAN SECTION    . CHOLECYSTECTOMY    . SPINAL FUSION    . THYROIDECTOMY    . TONSILLECTOMY    . TUBAL LIGATION      SOCIAL HISTORY: Social History   Tobacco Use  . Smoking status: Former Smoker    Packs/day: 0.25    Years: 12.00    Pack years: 3.00    Types: Cigarettes    Quit date: 09/07/2004    Years since quitting: 14.9  . Smokeless tobacco: Never Used  Substance Use Topics  . Alcohol use: Yes    Comment: 4 times a year  . Drug use: No    FAMILY HISTORY: Family History  Problem Relation Age of Onset  . Cancer Maternal Uncle   . Heart disease Father   . High Cholesterol Father   . Heart disease Paternal Grandfather   . Breast cancer Maternal Grandmother   . Rheum arthritis Mother   . Obesity Mother   . Rheum arthritis Maternal Grandfather   . Rheum arthritis Maternal Aunt    ROS: Review of Systems  Constitutional: Positive for malaise/fatigue  (no change in fatigue).  Cardiovascular: Negative for chest pain and palpitations.  Gastrointestinal: Negative for nausea and vomiting.  Musculoskeletal:       Negative for muscle weakness.  Endo/Heme/Allergies:       Negative for hypoglycemia.  Psychiatric/Behavioral: Positive for depression. Negative for suicidal ideas.       Negative for homicidal ideas.   PHYSICAL EXAM: Blood pressure 115/74, pulse 86, temperature 97.6 F (36.4 C), temperature source Oral, height 5\' 1"  (1.549 m), weight 281 lb (127.5 kg), SpO2 95 %. Body mass index is 53.09 kg/m. Physical Exam Vitals signs reviewed.  Constitutional:      Appearance: Normal appearance. She is obese.  Cardiovascular:     Rate and Rhythm: Normal rate.     Pulses: Normal pulses.  Pulmonary:     Effort: Pulmonary effort is normal.     Breath sounds: Normal breath sounds.  Musculoskeletal: Normal range of motion.  Skin:    General: Skin is warm and dry.  Neurological:     Mental Status: She is alert and oriented to person, place, and time.  Psychiatric:        Behavior: Behavior normal.   RECENT LABS AND TESTS: BMET    Component Value Date/Time   NA 138 08/29/2018 1443   K 4.2 08/29/2018 1443   CL 100 08/29/2018 1443   CO2 24 08/29/2018 1443   GLUCOSE 100 (H) 08/29/2018 1443   GLUCOSE 90 10/01/2017 1041   BUN 13 08/29/2018 1443   CREATININE 0.63 08/29/2018 1443   CREATININE 0.53 10/01/2017 1041   CALCIUM 9.5 08/29/2018 1443   GFRNONAA 109 08/29/2018 1443   GFRNONAA 116 10/01/2017 1041   GFRAA 126 08/29/2018 1443   GFRAA 135 10/01/2017 1041   Lab Results  Component Value Date   HGBA1C 5.6 08/29/2018   HGBA1C 5.8 (H) 04/27/2018   HGBA1C 5.8 (H) 12/07/2017   HGBA1C 6.0 (H) 10/01/2017   Lab Results  Component Value Date   INSULIN 29.5 (H) 08/29/2018   INSULIN 46.1 (H) 04/27/2018   INSULIN 20.1 12/07/2017   CBC    Component Value Date/Time   WBC 5.6 04/27/2018 1144   WBC 7.0 10/01/2017 1041   RBC 4.85  04/27/2018 1144   RBC 5.02 10/01/2017 1041   HGB 13.4 04/27/2018 1144   HCT 42.9 04/27/2018 1144   PLT 244 10/01/2017 1041   MCV 89 04/27/2018 1144   MCH 27.6 04/27/2018 1144   MCH 28.3 10/01/2017 1041   MCHC 31.2 (L) 04/27/2018 1144   MCHC 34.5 10/01/2017 1041   RDW 14.1 04/27/2018 1144   LYMPHSABS 1.3 04/27/2018 1144   EOSABS 0.1 04/27/2018 1144   BASOSABS 0.0 04/27/2018 1144   Iron/TIBC/Ferritin/ %Sat No results found for: IRON, TIBC, FERRITIN, IRONPCTSAT Lipid Panel     Component Value Date/Time   CHOL 151 08/29/2018 1443   TRIG 196 (H) 08/29/2018 1443   HDL 29 (L) 08/29/2018 1443   CHOLHDL 2.9 10/08/2017 1103   LDLCALC 83 08/29/2018 1443   LDLCALC 69 10/08/2017 1103   Hepatic Function Panel     Component Value Date/Time   PROT 6.9 08/29/2018 1443   ALBUMIN 4.3 08/29/2018 1443   AST 24 08/29/2018 1443   ALT 28 08/29/2018 1443   ALKPHOS 64 08/29/2018 1443   BILITOT 0.3 08/29/2018 1443      Component Value Date/Time   TSH 1.390 08/29/2018 1443   TSH 0.377 (L) 04/27/2018 1144   TSH 1.890 02/15/2018 1137   Results for Pasley, Alexsandra S "[place]" (MRN [id number]) as of 08/07/2019 11:01  Ref. Range 08/29/2018 14:43  Vitamin D, 25-Hydroxy Latest  Ref Range: 30.0 - 100.0 ng/mL 46.1   OBESITY BEHAVIORAL INTERVENTION VISIT  Today's visit was #33  Starting weight: 292 lbs Starting date: 12/07/2017 Today's weight: 281 lbs  Today's date: 08/07/2019 Total lbs lost to date: 11 At least 15 minutes were spent on discussing the following behavioral intervention visit.    08/07/2019  Height 5\' 1"  (1.549 m)  Weight 281 lb (127.5 kg)  BMI (Calculated) 53.12  BLOOD PRESSURE - SYSTOLIC [id number]  BLOOD PRESSURE - DIASTOLIC 74   Body Fat % [id number] %  Total Body Water (lbs) 93 lbs   ASK: We discussed the diagnosis of obesity with [name] today and Batina agreed to give [place] permission to discuss obesity behavioral modification therapy today.  ASSESS: Chauntell has the diagnosis of  obesity and her BMI today is 53.2. Miette is in the action stage of change.   ADVISE: Greyson was educated on the multiple health risks of obesity as well as the benefit of weight loss to improve her health. She was advised of the need for long term treatment and the importance of lifestyle modifications to improve her current health and to decrease her risk of future health problems.  AGREE: Multiple dietary modification options and treatment options were discussed and  Jazzlyn agreed to follow the recommendations documented in the above note.  ARRANGE: Hortence was educated on the importance of frequent visits to treat obesity as outlined per CMS and USPSTF guidelines and agreed to schedule her next follow up appointment today.  I, Michaelene Song, am acting as Location manager for Dennard Nip, MD   I have reviewed the above documentation for accuracy and completeness, and I agree with the above. -Dennard Nip, MD

## 2019-08-08 LAB — COMPREHENSIVE METABOLIC PANEL
ALT: 39 IU/L — ABNORMAL HIGH (ref 0–32)
AST: 32 IU/L (ref 0–40)
Albumin/Globulin Ratio: 1.4 (ref 1.2–2.2)
Albumin: 4.2 g/dL (ref 3.8–4.8)
Alkaline Phosphatase: 74 IU/L (ref 39–117)
BUN/Creatinine Ratio: 16 (ref 9–23)
BUN: 12 mg/dL (ref 6–24)
Bilirubin Total: 0.3 mg/dL (ref 0.0–1.2)
CO2: 24 mmol/L (ref 20–29)
Calcium: 9.2 mg/dL (ref 8.7–10.2)
Chloride: 102 mmol/L (ref 96–106)
Creatinine, Ser: 0.73 mg/dL (ref 0.57–1.00)
GFR calc Af Amer: 116 mL/min/{1.73_m2} (ref >59)
GFR calc non Af Amer: 100 mL/min/{1.73_m2} (ref >59)
Globulin, Total: 3 g/dL (ref 1.5–4.5)
Glucose: 104 mg/dL — ABNORMAL HIGH (ref 65–99)
Potassium: 4.2 mmol/L (ref 3.5–5.2)
Sodium: 140 mmol/L (ref 134–144)
Total Protein: 7.2 g/dL (ref 6.0–8.5)

## 2019-08-08 LAB — TSH: TSH: 0.755 u[IU]/mL (ref 0.450–4.500)

## 2019-08-08 LAB — HEMOGLOBIN A1C
Est. average glucose Bld gHb Est-mCnc: 120 mg/dL
Hgb A1c MFr Bld: 5.8 % — ABNORMAL HIGH (ref 4.8–5.6)

## 2019-08-08 LAB — T4, FREE: Free T4: 0.97 ng/dL (ref 0.82–1.77)

## 2019-08-08 LAB — LIPID PANEL WITH LDL/HDL RATIO
Cholesterol, Total: 138 mg/dL (ref 100–199)
HDL: 43 mg/dL (ref >39)
LDL Chol Calc (NIH): 73 mg/dL (ref 0–99)
LDL/HDL Ratio: 1.7 ratio (ref 0.0–3.2)
Triglycerides: 121 mg/dL (ref 0–149)
VLDL Cholesterol Cal: 22 mg/dL (ref 5–40)

## 2019-08-08 LAB — VITAMIN D 25 HYDROXY (VIT D DEFICIENCY, FRACTURES): Vit D, 25-Hydroxy: 61.5 ng/mL (ref 30.0–100.0)

## 2019-08-08 LAB — INSULIN, RANDOM: INSULIN: 26.1 u[IU]/mL — ABNORMAL HIGH (ref 2.6–24.9)

## 2019-08-08 LAB — T3: T3, Total: 162 ng/dL (ref 71–180)

## 2019-08-21 ENCOUNTER — Ambulatory Visit (INDEPENDENT_AMBULATORY_CARE_PROVIDER_SITE_OTHER): Payer: Medicaid Other | Admitting: Family Medicine

## 2019-08-21 ENCOUNTER — Encounter (INDEPENDENT_AMBULATORY_CARE_PROVIDER_SITE_OTHER): Payer: Self-pay | Admitting: Family Medicine

## 2019-08-21 ENCOUNTER — Other Ambulatory Visit (INDEPENDENT_AMBULATORY_CARE_PROVIDER_SITE_OTHER): Payer: Self-pay | Admitting: Family Medicine

## 2019-08-21 ENCOUNTER — Other Ambulatory Visit: Payer: Self-pay

## 2019-08-21 VITALS — BP 110/77 | HR 95 | Temp 97.5°F | Ht 61.0 in | Wt 282.0 lb

## 2019-08-21 DIAGNOSIS — Z6841 Body Mass Index (BMI) 40.0 and over, adult: Secondary | ICD-10-CM | POA: Diagnosis not present

## 2019-08-21 DIAGNOSIS — R7303 Prediabetes: Secondary | ICD-10-CM | POA: Diagnosis not present

## 2019-08-21 DIAGNOSIS — F418 Other specified anxiety disorders: Secondary | ICD-10-CM | POA: Diagnosis not present

## 2019-08-21 MED ORDER — BUPROPION HCL ER (SR) 200 MG PO TB12: 200.0000 mg | ORAL_TABLET | Freq: Every day | ORAL | 0 refills | Status: DC

## 2019-08-22 ENCOUNTER — Telehealth (INDEPENDENT_AMBULATORY_CARE_PROVIDER_SITE_OTHER): Payer: Self-pay | Admitting: Family Medicine

## 2019-08-22 ENCOUNTER — Other Ambulatory Visit: Payer: Self-pay | Admitting: Family Medicine

## 2019-08-22 ENCOUNTER — Other Ambulatory Visit (INDEPENDENT_AMBULATORY_CARE_PROVIDER_SITE_OTHER): Payer: Self-pay | Admitting: Family Medicine

## 2019-08-22 DIAGNOSIS — R7303 Prediabetes: Secondary | ICD-10-CM

## 2019-08-22 DIAGNOSIS — M797 Fibromyalgia: Secondary | ICD-10-CM

## 2019-08-22 MED ORDER — METFORMIN HCL 500 MG PO TABS: ORAL_TABLET | ORAL | 0 refills | Status: DC

## 2019-08-22 NOTE — Telephone Encounter (Signed)
Pt aware rx will be sent in today

## 2019-08-22 NOTE — Telephone Encounter (Signed)
Patient states there is some confusion regarding her refill for metformin.  [name] is stating it is too soon to refill and needs approval.  Please advise.

## 2019-08-22 NOTE — Progress Notes (Signed)
Office: [phone]  /  Fax: [phone]   HPI:  Chief Complaint: OBESITY [name] is here to discuss her progress with her obesity treatment plan. She is keeping a food journal with 1200-1500 calories and 80+ grams of protein and states she is following her eating plan approximately 20% of the time. She states she is exercising 0 minutes 0 times per week.  [name] has done well minimizing holiday weight gain. She notes her motivation is waning and she is frustrated she hasn't done better this year.  Today's visit was #34  Starting weight: 292 lbs Starting date: 12/07/2017 Today's weight: 282 lbs  Today's date: 08/21/2019 Total lbs lost to date: 10  Total lbs lost since last in-office visit: 0  Depression with Anxiety [name] notes her mood has worsened and notes increased fatigue.  Prediabetes [name] has a diagnosis of prediabetes, which is worsening. Her A1c is slightly increased after Thanksgiving. She is working on diet and weight loss. She denies worsening polyphagia.  ASSESSMENT AND PLAN:  Prediabetes  Depression with anxiety - Plan: buPROPion (WELLBUTRIN SR) 200 MG 12 hr tablet  Class 3 severe obesity with serious comorbidity and body mass index (BMI) of 50.0 to 59.9 in adult, unspecified obesity type ([place])  PLAN:  Depression with Anxiety We will increase [name]'s Wellbutrin SR to 200 mg QAM #30 with 0 refills and she agrees to follow-up with our clinic in 3-4 weeks. She will continue Celexa.  Pre-Diabetes [name] will continue to work on weight loss, exercise, and decreasing simple carbohydrates to help decrease the risk of diabetes. She will have labs rechecked in 3 months.  Obesity [name] is currently in the action stage of change. As such, her goal is to continue with weight loss efforts She has agreed to portion control better and make smarter food choices, such as increase vegetables and decrease simple carbohydrates. [name] was congratulated on losing 10 lbs in 2020  when everyone else has been gaining. She was given support and encouragement.  [name] has been instructed to work up to a goal of 150 minutes of combined cardio and strengthening exercise per week for weight loss and overall health benefits. We discussed the following Behavioral Modification Strategies today: emotional eating strategies and holiday eating strategies.  [name] has agreed to follow-up with our clinic in 3-4 weeks. She was informed of the importance of frequent follow-up visits to maximize her success with intensive lifestyle modifications for her multiple health conditions.  ALLERGIES: Allergies  Allergen Reactions  . Erythromycin   . Zyrtec [Cetirizine Hcl]     Only allergic to Zyrtec D -     MEDICATIONS: Current Outpatient Medications on File Prior to Visit  Medication Sig Dispense Refill  . acetaminophen (TYLENOL) 325 MG tablet Take 650 mg by mouth every 6 (six) hours as needed.    [name] Kitchen albuterol (PROVENTIL HFA;VENTOLIN HFA) 108 (90 Base) MCG/ACT inhaler Inhale 2 puffs into the lungs every 6 (six) hours as needed for wheezing or shortness of breath. 1 Inhaler 0  . albuterol (PROVENTIL) (2.5 MG/3ML) 0.083% nebulizer solution Take 3 mLs (2.5 mg total) by nebulization every 6 (six) hours as needed for wheezing or shortness of breath. 150 mL 1  . ARMOUR THYROID 120 MG tablet TAKE ONE TABLET BY MOUTH ONCE A DAY BEFORE BREAKFAST 90 tablet 0  . Azelastine-Fluticasone 137-50 MCG/ACT SUSP Place 1 spray into the nose daily.    . budesonide-formoterol (SYMBICORT) 80-4.5 MCG/ACT inhaler Inhale 2 puffs into the lungs 2 (two) times daily.  1 Inhaler 3  . citalopram (CELEXA) 20 MG tablet Take 20 mg by mouth daily.      . clonazePAM (KLONOPIN) 0.5 MG tablet Take 0.5 mg by mouth 3 (three) times daily as needed for anxiety.    . cyclobenzaprine (FLEXERIL) 10 MG tablet Take 0.5-1 tablets (5-10 mg total) by mouth 3 (three) times daily as needed for muscle spasms. 30 tablet 0  . diclofenac (VOLTAREN)  75 MG EC tablet TAKE 1 TABLET BY MOUTH ONCE A DAY AS NEEDED FOR MILD PAIN OR MODERATEPAIN 90 tablet 0  . fexofenadine (ALLEGRA) 180 MG tablet Take 1 tablet (180 mg total) by mouth daily. 90 tablet 1  . gabapentin (NEURONTIN) 100 MG capsule Take 100 mg by mouth 3 (three) times daily.    [name] Kitchen guaiFENesin (MUCINEX) 600 MG 12 hr tablet Take 1 tablet (600 mg total) by mouth 2 (two) times daily. 20 tablet 0  . Homeopathic Products (ARNICARE ARTHRITIS) TBDP Take by mouth.    [name] Kitchen ibuprofen (ADVIL,MOTRIN) 200 MG tablet Take 200 mg by mouth every 6 (six) hours as needed.    . metFORMIN (GLUCOPHAGE) 500 MG tablet TAKE 1 TABLET BY MOUTH 2 TIMES DAILY WITH A MEAL 60 tablet 0  . montelukast (SINGULAIR) 10 MG tablet TAKE ONE TABLET BY MOUTH AT BEDTIME 90 tablet 0  . omeprazole (PRILOSEC) 20 MG capsule Take 20 mg by mouth daily.    . traMADol (ULTRAM) 50 MG tablet Take 50 mg by mouth every 6 (six) hours as needed.    [name] Kitchen VITAMIN D, CHOLECALCIFEROL, PO Take 50 mcg by mouth 2 (two) times daily.    [name] Kitchen zolpidem (AMBIEN) 5 MG tablet Take 10 mg by mouth at bedtime as needed.      No current facility-administered medications on file prior to visit.    PAST MEDICAL HISTORY: Past Medical History:  Diagnosis Date  . Asthma   . Chronic fatigue   . Chronic pain    back and hips  . Fibromyalgia   . High cholesterol   . Insomnia   . Prediabetes   . Scoliosis   . Sleep apnea     PAST SURGICAL HISTORY: Past Surgical History:  Procedure Laterality Date  . ABDOMINAL HYSTERECTOMY    . CESAREAN SECTION    . CHOLECYSTECTOMY    . SPINAL FUSION    . THYROIDECTOMY    . TONSILLECTOMY    . TUBAL LIGATION      SOCIAL HISTORY: Social History   Tobacco Use  . Smoking status: Former Smoker    Packs/day: 0.25    Years: 12.00    Pack years: 3.00    Types: Cigarettes    Quit date: 09/07/2004    Years since quitting: 14.9  . Smokeless tobacco: Never Used  Substance Use Topics  . Alcohol use: Yes    Comment: 4 times a  year  . Drug use: No    FAMILY HISTORY: Family History  Problem Relation Age of Onset  . Cancer Maternal Uncle   . Heart disease Father   . High Cholesterol Father   . Heart disease Paternal Grandfather   . Breast cancer Maternal Grandmother   . Rheum arthritis Mother   . Obesity Mother   . Rheum arthritis Maternal Grandfather   . Rheum arthritis Maternal Aunt    ROS: Review of Systems  Constitutional: Positive for malaise/fatigue (increased).  Endo/Heme/Allergies:       Negative for worsening polyphagia.  Psychiatric/Behavioral: Positive for depression. The patient is  nervous/anxious.    PHYSICAL EXAM: Blood pressure 110/77, pulse 95, temperature (!) 97.5 F (36.4 C), temperature source Oral, height 5\' 1"  (1.549 m), weight 282 lb (127.9 kg), SpO2 96 %. Body mass index is 53.28 kg/m. Physical Exam Vitals reviewed.  Constitutional:      Appearance: Normal appearance. She is obese.  Cardiovascular:     Rate and Rhythm: Normal rate.     Pulses: Normal pulses.  Pulmonary:     Effort: Pulmonary effort is normal.     Breath sounds: Normal breath sounds.  Musculoskeletal:        General: Normal range of motion.  Skin:    General: Skin is warm and dry.  Neurological:     Mental Status: She is alert and oriented to person, place, and time.  Psychiatric:        Behavior: Behavior normal.   RECENT LABS AND TESTS: BMET    Component Value Date/Time   NA 140 08/07/2019 0000   K 4.2 08/07/2019 0000   CL 102 08/07/2019 0000   CO2 24 08/07/2019 0000   GLUCOSE 104 (H) 08/07/2019 0000   GLUCOSE 90 10/01/2017 1041   BUN 12 08/07/2019 0000   CREATININE 0.73 08/07/2019 0000   CREATININE 0.53 10/01/2017 1041   CALCIUM 9.2 08/07/2019 0000   GFRNONAA 100 08/07/2019 0000   GFRNONAA 116 10/01/2017 1041   GFRAA 116 08/07/2019 0000   GFRAA 135 10/01/2017 1041   Lab Results  Component Value Date   HGBA1C 5.8 (H) 08/07/2019   HGBA1C 5.6 08/29/2018   HGBA1C 5.8 (H) 04/27/2018     HGBA1C 5.8 (H) 12/07/2017   HGBA1C 6.0 (H) 10/01/2017   Lab Results  Component Value Date   INSULIN 26.1 (H) 08/07/2019   INSULIN 29.5 (H) 08/29/2018   INSULIN 46.1 (H) 04/27/2018   INSULIN 20.1 12/07/2017   CBC    Component Value Date/Time   WBC 5.6 04/27/2018 1144   WBC 7.0 10/01/2017 1041   RBC 4.85 04/27/2018 1144   RBC 5.02 10/01/2017 1041   HGB 13.4 04/27/2018 1144   HCT 42.9 04/27/2018 1144   PLT 244 10/01/2017 1041   MCV 89 04/27/2018 1144   MCH 27.6 04/27/2018 1144   MCH 28.3 10/01/2017 1041   MCHC 31.2 (L) 04/27/2018 1144   MCHC 34.5 10/01/2017 1041   RDW 14.1 04/27/2018 1144   LYMPHSABS 1.3 04/27/2018 1144   EOSABS 0.1 04/27/2018 1144   BASOSABS 0.0 04/27/2018 1144   Iron/TIBC/Ferritin/ %Sat No results found for: IRON, TIBC, FERRITIN, IRONPCTSAT Lipid Panel     Component Value Date/Time   CHOL 138 08/07/2019 0000   TRIG 121 08/07/2019 0000   HDL 43 08/07/2019 0000   CHOLHDL 2.9 10/08/2017 1103   LDLCALC 73 08/07/2019 0000   LDLCALC 69 10/08/2017 1103   Hepatic Function Panel     Component Value Date/Time   PROT 7.2 08/07/2019 0000   ALBUMIN 4.2 08/07/2019 0000   AST 32 08/07/2019 0000   ALT 39 (H) 08/07/2019 0000   ALKPHOS 74 08/07/2019 0000   BILITOT 0.3 08/07/2019 0000      Component Value Date/Time   TSH 0.755 08/07/2019 0000   TSH 1.390 08/29/2018 1443   TSH 0.377 (L) 04/27/2018 1144      I, Michaelene Song, am acting as Location manager for Dennard Nip, MD I have reviewed the above documentation for accuracy and completeness, and I agree with the above. -Dennard Nip, MD

## 2019-09-12 DIAGNOSIS — U071 COVID-19: Secondary | ICD-10-CM | POA: Diagnosis not present

## 2019-09-12 DIAGNOSIS — Z1159 Encounter for screening for other viral diseases: Secondary | ICD-10-CM | POA: Diagnosis not present

## 2019-09-13 ENCOUNTER — Ambulatory Visit (INDEPENDENT_AMBULATORY_CARE_PROVIDER_SITE_OTHER): Payer: Medicaid Other | Admitting: Family Medicine

## 2019-09-20 ENCOUNTER — Other Ambulatory Visit (INDEPENDENT_AMBULATORY_CARE_PROVIDER_SITE_OTHER): Payer: Self-pay | Admitting: Family Medicine

## 2019-09-20 ENCOUNTER — Other Ambulatory Visit: Payer: Self-pay | Admitting: Family Medicine

## 2019-09-20 ENCOUNTER — Ambulatory Visit (INDEPENDENT_AMBULATORY_CARE_PROVIDER_SITE_OTHER): Payer: Medicaid Other | Admitting: Family Medicine

## 2019-09-20 DIAGNOSIS — F418 Other specified anxiety disorders: Secondary | ICD-10-CM

## 2019-09-20 DIAGNOSIS — R7303 Prediabetes: Secondary | ICD-10-CM

## 2019-09-20 DIAGNOSIS — J309 Allergic rhinitis, unspecified: Secondary | ICD-10-CM

## 2019-09-20 DIAGNOSIS — M797 Fibromyalgia: Secondary | ICD-10-CM

## 2019-09-20 NOTE — Telephone Encounter (Signed)
Requested medication (s) are due for refill today: yes  Requested medication (s) are on the active medication list: yes  Last refill:  06/22/19   Future visit scheduled: yes  Notes to clinic:  NSAIDS failed  Requested Prescriptions  Pending Prescriptions Disp Refills   diclofenac (VOLTAREN) 75 MG EC tablet [Pharmacy Med Name: DICLOFENAC SODIUM 75 MG DR TAB] 90 tablet 0    Sig: TAKE 1 TABLET BY MOUTH ONCE DAILY AS NEEDED FOR MILD TO MODERATE PAIN      Analgesics:  NSAIDS Failed - 09/20/2019  4:39 PM      Failed - HGB in normal range and within 360 days    Hemoglobin  Date Value Ref Range Status  04/27/2018 13.4 11.1 - 15.9 g/dL Final          Passed - Cr in normal range and within 360 days    Creat  Date Value Ref Range Status  10/01/2017 0.53 0.50 - 1.10 mg/dL Final   Creatinine, Ser  Date Value Ref Range Status  08/07/2019 0.73 0.57 - 1.00 mg/dL Final          Passed - Patient is not pregnant      Passed - Valid encounter within last 12 months    Recent Outpatient Visits           1 month ago Acute pain of left shoulder   [facility] Delsa Grana, PA-C   5 months ago Acute pain of right wrist   [facility] Steele Sizer, MD   9 months ago Respiratory infection   [place], Steele, FNP   11 months ago Well woman exam (no gynecological exam)   [place], FNP   1 year ago Vertigo   [place], NP               Signed Prescriptions Disp Refills   montelukast (SINGULAIR) 10 MG tablet 90 tablet 0    Sig: TAKE 1 TABLET BY MOUTH EVERY NIGHT AT BEDTIME      Pulmonology:  Leukotriene Inhibitors Passed - 09/20/2019  4:39 PM      Passed - Valid encounter within last 12 months    Recent Outpatient Visits           1 month ago Acute pain of left shoulder   [facility] Delsa Grana, PA-C   5  months ago Acute pain of right wrist   [facility] Steele Sizer, MD   9 months ago Respiratory infection   [place], [place], [place]   11 months ago Well woman exam (no gynecological exam)   [place], [place], FNP   1 year ago Vertigo   [place], NP

## 2019-09-21 ENCOUNTER — Encounter (INDEPENDENT_AMBULATORY_CARE_PROVIDER_SITE_OTHER): Payer: Self-pay | Admitting: Family Medicine

## 2019-09-25 ENCOUNTER — Other Ambulatory Visit (INDEPENDENT_AMBULATORY_CARE_PROVIDER_SITE_OTHER): Payer: Self-pay

## 2019-09-25 DIAGNOSIS — F418 Other specified anxiety disorders: Secondary | ICD-10-CM

## 2019-09-25 DIAGNOSIS — R7303 Prediabetes: Secondary | ICD-10-CM

## 2019-09-25 MED ORDER — BUPROPION HCL ER (SR) 200 MG PO TB12: 200.0000 mg | ORAL_TABLET | Freq: Every day | ORAL | 0 refills | Status: DC

## 2019-09-25 MED ORDER — METFORMIN HCL 500 MG PO TABS: ORAL_TABLET | ORAL | 0 refills | Status: DC

## 2019-09-25 NOTE — Telephone Encounter (Signed)
Please schedule patient for follow up in the next 30 days.  

## 2019-09-26 NOTE — Telephone Encounter (Signed)
lft message to sch followup in 30 days

## 2019-10-03 ENCOUNTER — Other Ambulatory Visit: Payer: Self-pay

## 2019-10-03 ENCOUNTER — Ambulatory Visit (INDEPENDENT_AMBULATORY_CARE_PROVIDER_SITE_OTHER): Payer: Medicaid Other | Admitting: Bariatrics

## 2019-10-03 ENCOUNTER — Encounter (INDEPENDENT_AMBULATORY_CARE_PROVIDER_SITE_OTHER): Payer: Self-pay | Admitting: Bariatrics

## 2019-10-03 VITALS — BP 94/55 | HR 88 | Temp 98.1°F | Ht 61.0 in | Wt 285.0 lb

## 2019-10-03 DIAGNOSIS — E038 Other specified hypothyroidism: Secondary | ICD-10-CM

## 2019-10-03 DIAGNOSIS — F3289 Other specified depressive episodes: Secondary | ICD-10-CM

## 2019-10-03 DIAGNOSIS — Z6841 Body Mass Index (BMI) 40.0 and over, adult: Secondary | ICD-10-CM | POA: Diagnosis not present

## 2019-10-03 DIAGNOSIS — R7303 Prediabetes: Secondary | ICD-10-CM

## 2019-10-04 ENCOUNTER — Encounter (INDEPENDENT_AMBULATORY_CARE_PROVIDER_SITE_OTHER): Payer: Self-pay | Admitting: Bariatrics

## 2019-10-04 NOTE — Progress Notes (Signed)
Chief Complaint:   OBESITY [name] is here to discuss her progress with her obesity treatment plan along with follow-up of her obesity related diagnoses. [name] is on practicing portion control and making smarter food choices, such as increasing vegetables and decreasing simple carbohydrates and states she is following her eating plan approximately 0% of the time. [name] states she is doing 0 minutes 0 times per week.  Today's visit was #: 85 Starting weight: 292 lbs Starting date: 12/07/17 Today's weight: 285 lbs Today's date: 10/03/2019 Total lbs lost to date: 7 Total lbs lost since last in-office visit: 0  Interim History: [name] normally sees [name]. This is my first visit with this patient. She has struggled since the first of the year. She is doing ok with her water intake. She is skipping lunch.  Subjective:   1. Pre-diabetes [name] is taking metformin and she notes decreased hunger.  2. Other specified hypothyroidism [name] is taking Armour thyroid and she notes fatigue.  3. Other depression, with emotional eating I will check on [name]'s referral from [name].  Assessment/Plan:   1. Pre-diabetes [name] agreed to continue her metformin, and she will continue to work on weight loss, exercise, and decreasing simple carbohydrates to help decrease the risk of diabetes. We will continue to monitor.  2. Other specified hypothyroidism Patient with long-standing hypothyroidism, on Armour thyroid therapy. She appears euthyroid. Orders and follow up as documented in patient record.  Counseling . Good thyroid control is important for overall health. Supratherapeutic thyroid levels are dangerous and will not improve weight loss results.  3. Other depression, with emotional eating Behavior modification techniques were discussed today to help [name] deal with her emotional/non-hunger eating behaviors. We have sent a referral for behavioral medicine [name]). Orders and follow  up as documented in patient record.   - Ambulatory referral to Psychology  4. Class 3 severe obesity with serious comorbidity and body mass index (BMI) of 50.0 to 59.9 in adult, unspecified obesity type [name] is currently in the action stage of change. As such, her goal is to continue with weight loss efforts. She has agreed to keeping a food journal and adhering to recommended goals of 1200-1500 calories and 80 grams of protein daily.   [name] is to have microwave meals at lunch.  Exercise goals: For substantial health benefits, adults should do at least 150 minutes (2 hours and 30 minutes) a week of moderate-intensity, or 75 minutes (1 hour and 15 minutes) a week of vigorous-intensity aerobic physical activity, or an equivalent combination of moderate- and vigorous-intensity aerobic activity. Aerobic activity should be performed in episodes of at least 10 minutes, and preferably, it should be spread throughout the week.  Behavioral modification strategies: increasing lean protein intake, decreasing simple carbohydrates, increasing vegetables, increasing water intake, decreasing eating out, no skipping meals, meal planning and cooking strategies, keeping healthy foods in the home and planning for success.  [name] has agreed to follow-up with our clinic in 2 to 3 weeks with [name]. She was informed of the importance of frequent follow-up visits to maximize her success with intensive lifestyle modifications for her multiple health conditions.   Objective:   Blood pressure (!) 94/55, pulse 88, temperature 98.1 F (36.7 C), height 5\' 1"  (1.549 m), weight 285 lb (129.3 kg), SpO2 94 %. Body mass index is 53.85 kg/m.  General: Cooperative, alert, well developed, in no acute distress. HEENT: Conjunctivae and lids unremarkable. Cardiovascular: Regular rhythm.  Lungs: Normal  work of breathing. Neurologic: No focal deficits.   Lab Results  Component Value Date   CREATININE 0.73  08/07/2019   BUN 12 08/07/2019   NA 140 08/07/2019   K 4.2 08/07/2019   CL 102 08/07/2019   CO2 24 08/07/2019   Lab Results  Component Value Date   ALT 39 (H) 08/07/2019   AST 32 08/07/2019   ALKPHOS 74 08/07/2019   BILITOT 0.3 08/07/2019   Lab Results  Component Value Date   HGBA1C 5.8 (H) 08/07/2019   HGBA1C 5.6 08/29/2018   HGBA1C 5.8 (H) 04/27/2018   HGBA1C 5.8 (H) 12/07/2017   HGBA1C 6.0 (H) 10/01/2017   Lab Results  Component Value Date   INSULIN 26.1 (H) 08/07/2019   INSULIN 29.5 (H) 08/29/2018   INSULIN 46.1 (H) 04/27/2018   INSULIN 20.1 12/07/2017   Lab Results  Component Value Date   TSH 0.755 08/07/2019   Lab Results  Component Value Date   CHOL 138 08/07/2019   HDL 43 08/07/2019   LDLCALC 73 08/07/2019   TRIG 121 08/07/2019   CHOLHDL 2.9 10/08/2017   Lab Results  Component Value Date   WBC 5.6 04/27/2018   HGB 13.4 04/27/2018   HCT 42.9 04/27/2018   MCV 89 04/27/2018   PLT 244 10/01/2017   No results found for: IRON, TIBC, FERRITIN  Attestation Statements:   Reviewed by clinician on day of visit: allergies, medications, problem list, medical history, surgical history, family history, social history, and previous encounter notes.  Time spent on visit including pre-visit chart review and post-visit care was 20 minutes.   Wilhemena Durie, am acting as Location manager for [employer], DO.  I have reviewed the above documentation for accuracy and completeness, and I agree with the above. Jearld Lesch, DO

## 2019-10-18 ENCOUNTER — Other Ambulatory Visit: Payer: Self-pay

## 2019-10-18 ENCOUNTER — Encounter (INDEPENDENT_AMBULATORY_CARE_PROVIDER_SITE_OTHER): Payer: Self-pay | Admitting: Family Medicine

## 2019-10-18 ENCOUNTER — Ambulatory Visit (INDEPENDENT_AMBULATORY_CARE_PROVIDER_SITE_OTHER): Payer: Medicaid Other | Admitting: Family Medicine

## 2019-10-18 VITALS — BP 127/91 | HR 80 | Temp 97.8°F | Ht 61.0 in | Wt 284.0 lb

## 2019-10-18 DIAGNOSIS — R7303 Prediabetes: Secondary | ICD-10-CM | POA: Diagnosis not present

## 2019-10-18 DIAGNOSIS — F3289 Other specified depressive episodes: Secondary | ICD-10-CM

## 2019-10-18 DIAGNOSIS — Z6841 Body Mass Index (BMI) 40.0 and over, adult: Secondary | ICD-10-CM | POA: Diagnosis not present

## 2019-10-19 MED ORDER — METFORMIN HCL 500 MG PO TABS: ORAL_TABLET | ORAL | 0 refills | Status: DC

## 2019-10-19 NOTE — Progress Notes (Signed)
Chief Complaint:   OBESITY [name] is here to discuss her progress with her obesity treatment plan along with follow-up of her obesity related diagnoses. [name] is on keeping a food journal and adhering to recommended goals of 1200-1500 calories and 80 grams of protein daily and states she is following her eating plan approximately 55-60% of the time. [name] states she is doing 0 minutes 0 times per week.  Today's visit was #: 20 Starting weight: 292 lbs Starting date: 12/07/17 Today's weight: 284 lbs Today's date: 10/18/2019 Total lbs lost to date: 8 Total lbs lost since last in-office visit: 1  Interim History: [name] has started to get back on track with journaling this last week, and she feels she is doing well overall. She still notes some stress eating, especially with family stress.  Subjective:   1. Pre-diabetes [name] is stable on metformin, and she is doing better with diet. She requests a refill today.  2. Other depression, with emotinal eating [name] was referred to Psych but she hasn't heard back from them. She also contacted them but she hasn't heard back.  Assessment/Plan:   1. Pre-diabetes [name] will continue to work on weight loss, exercise, and decreasing simple carbohydrates to help decrease the risk of diabetes. We will refill metformin for 1 month, and will continue to monitor.  - metFORMIN (GLUCOPHAGE) 500 MG tablet; TAKE 1 TABLET BY MOUTH 2 TIMES DAILY WITH A MEAL  Dispense: 60 tablet; Refill: 0  2. Other depression, with emotinal eating Behavior modification techniques were discussed today to help [name] deal with her emotional/non-hunger eating behaviors. We will check into her Psych referral and will follow up. Orders and follow up as documented in patient record.   3. Class 3 severe obesity with serious comorbidity and body mass index (BMI) of 50.0 to 59.9 in adult, unspecified obesity type [name] is currently in the action stage of change. As such, her  goal is to continue with weight loss efforts. She has agreed to keeping a food journal and adhering to recommended goals of 1200-1500 calories and 80+ grams of protein daily.   Exercise goals: No exercise has been prescribed at this time.  Behavioral modification strategies: meal planning and cooking strategies.  [name] has agreed to follow-up with our clinic in 2 to 3 weeks. She was informed of the importance of frequent follow-up visits to maximize her success with intensive lifestyle modifications for her multiple health conditions.   Objective:   Blood pressure (!) 127/91, pulse 80, temperature 97.8 F (36.6 C), temperature source Oral, height 5\' 1"  (1.549 m), weight 284 lb (128.8 kg), SpO2 96 %. Body mass index is 53.66 kg/m.  General: Cooperative, alert, well developed, in no acute distress. HEENT: Conjunctivae and lids unremarkable. Cardiovascular: Regular rhythm.  Lungs: Normal work of breathing. Neurologic: No focal deficits.   Lab Results  Component Value Date   CREATININE 0.73 08/07/2019   BUN 12 08/07/2019   NA 140 08/07/2019   K 4.2 08/07/2019   CL 102 08/07/2019   CO2 24 08/07/2019   Lab Results  Component Value Date   ALT 39 (H) 08/07/2019   AST 32 08/07/2019   ALKPHOS 74 08/07/2019   BILITOT 0.3 08/07/2019   Lab Results  Component Value Date   HGBA1C 5.8 (H) 08/07/2019   HGBA1C 5.6 08/29/2018   HGBA1C 5.8 (H) 04/27/2018   HGBA1C 5.8 (H) 12/07/2017   HGBA1C 6.0 (H) 10/01/2017   Lab Results  Component Value Date  INSULIN 26.1 (H) 08/07/2019   INSULIN 29.5 (H) 08/29/2018   INSULIN 46.1 (H) 04/27/2018   INSULIN 20.1 12/07/2017   Lab Results  Component Value Date   TSH 0.755 08/07/2019   Lab Results  Component Value Date   CHOL 138 08/07/2019   HDL 43 08/07/2019   LDLCALC 73 08/07/2019   TRIG 121 08/07/2019   CHOLHDL 2.9 10/08/2017   Lab Results  Component Value Date   WBC 5.6 04/27/2018   HGB 13.4 04/27/2018   HCT 42.9 04/27/2018   MCV  89 04/27/2018   PLT 244 10/01/2017   No results found for: IRON, TIBC, FERRITIN  Attestation Statements:   Reviewed by clinician on day of visit: allergies, medications, problem list, medical history, surgical history, family history, social history, and previous encounter notes.   I, Trixie Dredge, am acting as transcriptionist for Dennard Nip, MD.  I have reviewed the above documentation for accuracy and completeness, and I agree with the above. -  Dennard Nip, MD

## 2019-10-24 ENCOUNTER — Other Ambulatory Visit: Payer: Self-pay | Admitting: Family Medicine

## 2019-10-24 ENCOUNTER — Other Ambulatory Visit (INDEPENDENT_AMBULATORY_CARE_PROVIDER_SITE_OTHER): Payer: Self-pay | Admitting: Family Medicine

## 2019-10-24 DIAGNOSIS — H9201 Otalgia, right ear: Secondary | ICD-10-CM

## 2019-10-24 DIAGNOSIS — Z8669 Personal history of other diseases of the nervous system and sense organs: Secondary | ICD-10-CM

## 2019-10-24 DIAGNOSIS — M797 Fibromyalgia: Secondary | ICD-10-CM

## 2019-10-24 DIAGNOSIS — J309 Allergic rhinitis, unspecified: Secondary | ICD-10-CM

## 2019-10-24 DIAGNOSIS — F418 Other specified anxiety disorders: Secondary | ICD-10-CM

## 2019-10-24 NOTE — Telephone Encounter (Signed)
Requested medication (s) are due for refill today:  Yesquested medication (s) are on the active medication list:   Yes  Future visit scheduled:   No   Last ordered: 09/25/2019  #30  0.   Per [name]  Boyce's note she is to have a follow up appt in 30 days.    Also failed the NSAID protocol.   Returned for provider review.  Requested Prescriptions  Pending Prescriptions Disp Refills   diclofenac (VOLTAREN) 75 MG EC tablet [Pharmacy Med Name: DICLOFENAC SODIUM 75 MG DR TAB] 30 tablet 0    Sig: TAKE 1 TABLET BY MOUTH ONCE DAILY AS NEEDED FOR MILD TO MODERATE PAIN      Analgesics:  NSAIDS Failed - 10/24/2019 12:58 PM      Failed - HGB in normal range and within 360 days    Hemoglobin  Date Value Ref Range Status  04/27/2018 13.4 11.1 - 15.9 g/dL Final          Passed - Cr in normal range and within 360 days    Creat  Date Value Ref Range Status  10/01/2017 0.53 0.50 - 1.10 mg/dL Final   Creatinine, Ser  Date Value Ref Range Status  08/07/2019 0.73 0.57 - 1.00 mg/dL Final          Passed - Patient is not pregnant      Passed - Valid encounter within last 12 months    Recent Outpatient Visits           2 months ago Acute pain of left shoulder   [facility] Delsa Grana, PA-C   7 months ago Acute pain of right wrist   [facility] Steele Sizer, MD   10 months ago Respiratory infection   [place], [place], FNP   1 year ago Well woman exam (no gynecological exam)   [place], [place], FNP   1 year ago Vertigo   [place], NP

## 2019-10-24 NOTE — Telephone Encounter (Signed)
Requested Prescriptions  Pending Prescriptions Disp Refills  . fexofenadine (ALLEGRA) 180 MG tablet [Pharmacy Med Name: FEXOFENADINE HCL 180 MG TAB] 90 tablet 1    Sig: TAKE 1 TABLET BY MOUTH ONCE DAILY     Ear, Nose, and Throat:  Antihistamines Passed - 10/24/2019  1:05 PM      Passed - Valid encounter within last 12 months    Recent Outpatient Visits          2 months ago Acute pain of left shoulder   [facility] Delsa Grana, PA-C   7 months ago Acute pain of right wrist   [facility] Steele Sizer, MD   10 months ago Respiratory infection   [facility] [place], FNP   1 year ago Well woman exam (no gynecological exam)   [place], FNP   1 year ago Vertigo   [place], NP

## 2019-10-31 ENCOUNTER — Telehealth (INDEPENDENT_AMBULATORY_CARE_PROVIDER_SITE_OTHER): Payer: Medicaid Other | Admitting: Bariatrics

## 2019-10-31 ENCOUNTER — Other Ambulatory Visit: Payer: Self-pay

## 2019-10-31 ENCOUNTER — Encounter (INDEPENDENT_AMBULATORY_CARE_PROVIDER_SITE_OTHER): Payer: Self-pay | Admitting: Bariatrics

## 2019-10-31 DIAGNOSIS — Z6841 Body Mass Index (BMI) 40.0 and over, adult: Secondary | ICD-10-CM

## 2019-10-31 DIAGNOSIS — F3289 Other specified depressive episodes: Secondary | ICD-10-CM

## 2019-10-31 DIAGNOSIS — R7303 Prediabetes: Secondary | ICD-10-CM | POA: Diagnosis not present

## 2019-11-01 NOTE — Progress Notes (Signed)
TeleHealth Visit:  Due to the COVID-19 pandemic, this visit was completed with telemedicine (audio/video) technology to reduce patient and provider exposure as well as to preserve personal protective equipment.   [name] has verbally consented to this TeleHealth visit. The patient is located at home, the provider is located at the [facility] office. The participants in this visit include the listed provider and patient. The visit was conducted today via FaceTime.  Chief Complaint: OBESITY [name] is here to discuss her progress with her obesity treatment plan along with follow-up of her obesity related diagnoses. [name] is keeping a food journal and adhering to recommended goals of 1500-1700 calories and 90 grams of protein and states she is following her eating plan approximately 75% of the time. [name] states she is exercising 0 minutes 0 times per week.  Today's visit was #: 42 Starting weight: 292 lbs Starting date: 12/07/2017  Interim History: [name] states that she has lost 1 lb. She normally sees Dr. Leafy Ro. She is doing better and is getting back on track. She reports doing well with her water intake.  Subjective:   Prediabetes. [name] has a diagnosis of prediabetes based on her elevated HgA1c and was informed this puts her at greater risk of developing diabetes. She continues to work on diet and exercise to decrease her risk of diabetes. She denies nausea or hypoglycemia.   Lab Results  Component Value Date   HGBA1C 5.8 (H) 08/07/2019   Lab Results  Component Value Date   INSULIN 26.1 (H) 08/07/2019   INSULIN 29.5 (H) 08/29/2018   INSULIN 46.1 (H) 04/27/2018   INSULIN 20.1 12/07/2017   Other depression, with emotional eating. [name] is struggling with emotional eating and using food for comfort to the extent that it is negatively impacting her health. She has been working on behavior modification techniques to help reduce her emotional eating and has been  somewhat successful. She shows no sign of suicidal or homicidal ideations.  Assessment/Plan:   Prediabetes. [name] will continue to work on weight loss, exercise, increasing healthy fats and protein, and decreasing simple carbohydrates to help decrease the risk of diabetes.   Other depression, with emotional eating. Behavior modification techniques were discussed today to help [name] deal with her emotional/non-hunger eating behaviors.  Orders and follow up as documented in patient record. [name] will continue working on CBT techniques for stress/emotional eating.  Class 3 severe obesity with serious comorbidity and body mass index (BMI) of 50.0 to 59.9 in adult, unspecified obesity type ([place]).  [name] is currently in the action stage of change. As such, her goal is to continue with weight loss efforts. She has agreed to keeping a food journal and adhering to recommended goals of 1500-1700 calories and 90 grams of protein.   She will work on meal planning, intentional eating, will not skip meals, and will make better choices.  Exercise goals: [name] has knee pain, chronic, has fallen. She has iced the knee. She will do gentle exercises and will avoid pounding exercises.  Behavioral modification strategies: increasing lean protein intake, decreasing simple carbohydrates, increasing vegetables, increasing water intake, decreasing eating out, no skipping meals, meal planning and cooking strategies, keeping healthy foods in the home and planning for success.  [name] has agreed to follow-up with our clinic in 2 weeks. She was informed of the importance of frequent follow-up visits to maximize her success with intensive lifestyle modifications for her multiple health conditions.  Objective:   VITALS:  Per patient if applicable, see vitals. GENERAL: Alert and in no acute distress. CARDIOPULMONARY: No increased WOB. Speaking in clear sentences.  PSYCH: Pleasant and cooperative. Speech normal rate and  rhythm. Affect is appropriate. Insight and judgement are appropriate. Attention is focused, linear, and appropriate.  NEURO: Oriented as arrived to appointment on time with no prompting.   Lab Results  Component Value Date   CREATININE 0.73 08/07/2019   BUN 12 08/07/2019   NA 140 08/07/2019   K 4.2 08/07/2019   CL 102 08/07/2019   CO2 24 08/07/2019   Lab Results  Component Value Date   ALT 39 (H) 08/07/2019   AST 32 08/07/2019   ALKPHOS 74 08/07/2019   BILITOT 0.3 08/07/2019   Lab Results  Component Value Date   HGBA1C 5.8 (H) 08/07/2019   HGBA1C 5.6 08/29/2018   HGBA1C 5.8 (H) 04/27/2018   HGBA1C 5.8 (H) 12/07/2017   HGBA1C 6.0 (H) 10/01/2017   Lab Results  Component Value Date   INSULIN 26.1 (H) 08/07/2019   INSULIN 29.5 (H) 08/29/2018   INSULIN 46.1 (H) 04/27/2018   INSULIN 20.1 12/07/2017   Lab Results  Component Value Date   TSH 0.755 08/07/2019   Lab Results  Component Value Date   CHOL 138 08/07/2019   HDL 43 08/07/2019   LDLCALC 73 08/07/2019   TRIG 121 08/07/2019   CHOLHDL 2.9 10/08/2017   Lab Results  Component Value Date   WBC 5.6 04/27/2018   HGB 13.4 04/27/2018   HCT 42.9 04/27/2018   MCV 89 04/27/2018   PLT 244 10/01/2017   No results found for: IRON, TIBC, FERRITIN  Attestation Statements:   Reviewed by clinician on day of visit: allergies, medications, problem list, medical history, surgical history, family history, social history, and previous encounter notes.  Time spent on visit including pre-visit chart review and post-visit care was 20 minutes.   Migdalia Dk, am acting as Location manager for [employer], DO   I have reviewed the above documentation for accuracy and completeness, and I agree with the above. Jearld Lesch, DO

## 2019-11-13 DIAGNOSIS — R61 Generalized hyperhidrosis: Secondary | ICD-10-CM | POA: Diagnosis not present

## 2019-11-13 DIAGNOSIS — Z1231 Encounter for screening mammogram for malignant neoplasm of breast: Secondary | ICD-10-CM | POA: Diagnosis not present

## 2019-11-13 DIAGNOSIS — Z0000 Encounter for general adult medical examination without abnormal findings: Secondary | ICD-10-CM | POA: Diagnosis not present

## 2019-11-13 DIAGNOSIS — Z01419 Encounter for gynecological examination (general) (routine) without abnormal findings: Secondary | ICD-10-CM | POA: Diagnosis not present

## 2019-11-15 ENCOUNTER — Other Ambulatory Visit: Payer: Self-pay

## 2019-11-15 ENCOUNTER — Telehealth (INDEPENDENT_AMBULATORY_CARE_PROVIDER_SITE_OTHER): Payer: Medicaid Other | Admitting: Family Medicine

## 2019-11-15 DIAGNOSIS — Z6841 Body Mass Index (BMI) 40.0 and over, adult: Secondary | ICD-10-CM

## 2019-11-15 DIAGNOSIS — F418 Other specified anxiety disorders: Secondary | ICD-10-CM | POA: Diagnosis not present

## 2019-11-15 DIAGNOSIS — R7303 Prediabetes: Secondary | ICD-10-CM | POA: Diagnosis not present

## 2019-11-15 MED ORDER — BUPROPION HCL ER (SR) 150 MG PO TB12: 150.0000 mg | ORAL_TABLET | Freq: Every day | ORAL | 0 refills | Status: DC

## 2019-11-15 MED ORDER — METFORMIN HCL 500 MG PO TABS: ORAL_TABLET | ORAL | 0 refills | Status: DC

## 2019-11-15 NOTE — Progress Notes (Signed)
TeleHealth Visit:  Due to the COVID-19 pandemic, this visit was completed with telemedicine (audio/video) technology to reduce patient and provider exposure as well as to preserve personal protective equipment.   [name] has verbally consented to this TeleHealth visit. The patient is located at home, the provider is located at the [facility] office. The participants in this visit include the listed provider and patient. The visit was conducted today via face time.   Chief Complaint: OBESITY [name] is here to discuss her progress with her obesity treatment plan along with follow-up of her obesity related diagnoses. [name] is on keeping a food journal and adhering to recommended goals of 1500-1700 calories and 90+ grams of protein daily and states she is following her eating plan approximately 50% of the time. [name] states she is doing 0 minutes 0 times per week.  Today's visit was #: 80 Starting weight: 292 lbs Starting date: 12/07/2017  Interim History: [name] has done well maintaining her weight, but she would like to get back to weight loss. She still struggles with skipping some meals but she is working on this.  Subjective:   1. Depression with anxiety [name] is tolerating Wellbutrin and she feels it is helping but not enough. She wonders if we can increase the dose.  2. Pre-diabetes [name] is stable on metformin, and she denies nausea, vomiting, or hypoglycemia. She requests a refill today.  Assessment/Plan:   1. Depression with anxiety Behavior modification techniques were discussed today to help [name] deal with her emotional/non-hunger eating behaviors.  [name] agreed to increase Wellbutrin to 150 mg BID with no refills, and will continue to monitor. Orders and follow up as documented in patient record.   - buPROPion (WELLBUTRIN SR) 150 MG 12 hr tablet; Take 1 tablet (150 mg total) by mouth daily.  Dispense: 60 tablet; Refill: 0  2. Pre-diabetes [name] will continue  to work on weight loss, exercise, and decreasing simple carbohydrates to help decrease the risk of diabetes. We will refill metformin for 1 month.  - metFORMIN (GLUCOPHAGE) 500 MG tablet; TAKE 1 TABLET BY MOUTH 2 TIMES DAILY WITH A MEAL  Dispense: 60 tablet; Refill: 0  3. Class 3 severe obesity with serious comorbidity and body mass index (BMI) of 50.0 to 59.9 in adult, unspecified obesity type [name]) [name] is currently in the action stage of change. As such, her goal is to continue with weight loss efforts. She has agreed to keeping a food journal and adhering to recommended goals of 1500-1700 calories and 90+ grams of protein daily.   Behavioral modification strategies: no skipping meals and emotional eating strategies.  [name] has agreed to follow-up with our clinic in 3 weeks. She was informed of the importance of frequent follow-up visits to maximize her success with intensive lifestyle modifications for her multiple health conditions.  Objective:   VITALS: Per patient if applicable, see vitals. GENERAL: Alert and in no acute distress. CARDIOPULMONARY: No increased WOB. Speaking in clear sentences.  PSYCH: Pleasant and cooperative. Speech normal rate and rhythm. Affect is appropriate. Insight and judgement are appropriate. Attention is focused, linear, and appropriate.  NEURO: Oriented as arrived to appointment on time with no prompting.   Lab Results  Component Value Date   CREATININE 0.73 08/07/2019   BUN 12 08/07/2019   NA 140 08/07/2019   K 4.2 08/07/2019   CL 102 08/07/2019   CO2 24 08/07/2019   Lab Results  Component Value Date   ALT 39 (H)  08/07/2019   AST 32 08/07/2019   ALKPHOS 74 08/07/2019   BILITOT 0.3 08/07/2019   Lab Results  Component Value Date   HGBA1C 5.8 (H) 08/07/2019   HGBA1C 5.6 08/29/2018   HGBA1C 5.8 (H) 04/27/2018   HGBA1C 5.8 (H) 12/07/2017   HGBA1C 6.0 (H) 10/01/2017   Lab Results  Component Value Date   INSULIN 26.1 (H) 08/07/2019   INSULIN  29.5 (H) 08/29/2018   INSULIN 46.1 (H) 04/27/2018   INSULIN 20.1 12/07/2017   Lab Results  Component Value Date   TSH 0.755 08/07/2019   Lab Results  Component Value Date   CHOL 138 08/07/2019   HDL 43 08/07/2019   LDLCALC 73 08/07/2019   TRIG 121 08/07/2019   CHOLHDL 2.9 10/08/2017   Lab Results  Component Value Date   WBC 5.6 04/27/2018   HGB 13.4 04/27/2018   HCT 42.9 04/27/2018   MCV 89 04/27/2018   PLT 244 10/01/2017   No results found for: IRON, TIBC, FERRITIN  Attestation Statements:   Reviewed by clinician on day of visit: allergies, medications, problem list, medical history, surgical history, family history, social history, and previous encounter notes.   I, Trixie Dredge, am acting as transcriptionist for Dennard Nip, MD.  I have reviewed the above documentation for accuracy and completeness, and I agree with the above. - Dennard Nip, MD

## 2019-11-27 ENCOUNTER — Other Ambulatory Visit: Payer: Self-pay | Admitting: Family Medicine

## 2019-11-27 DIAGNOSIS — E038 Other specified hypothyroidism: Secondary | ICD-10-CM

## 2019-12-07 ENCOUNTER — Other Ambulatory Visit: Payer: Self-pay

## 2019-12-07 ENCOUNTER — Ambulatory Visit (INDEPENDENT_AMBULATORY_CARE_PROVIDER_SITE_OTHER): Payer: Medicaid Other | Admitting: Family Medicine

## 2019-12-07 ENCOUNTER — Encounter (INDEPENDENT_AMBULATORY_CARE_PROVIDER_SITE_OTHER): Payer: Self-pay | Admitting: Family Medicine

## 2019-12-07 VITALS — BP 104/75 | HR 79 | Temp 98.0°F | Ht 61.0 in | Wt 285.0 lb

## 2019-12-07 DIAGNOSIS — F418 Other specified anxiety disorders: Secondary | ICD-10-CM

## 2019-12-07 DIAGNOSIS — Z6841 Body Mass Index (BMI) 40.0 and over, adult: Secondary | ICD-10-CM

## 2019-12-07 DIAGNOSIS — R7303 Prediabetes: Secondary | ICD-10-CM

## 2019-12-07 MED ORDER — METFORMIN HCL 500 MG PO TABS: ORAL_TABLET | ORAL | 0 refills | Status: DC

## 2019-12-07 NOTE — Progress Notes (Signed)
Chief Complaint:   OBESITY [name] is here to discuss her progress with her obesity treatment plan along with follow-up of her obesity related diagnoses. [name] is on keeping a food journal and adhering to recommended goals of 1500-1700 calories and 90+ grams of protein daily and states she is following her eating plan approximately 0% of the time. [name] states she is doing 0 minutes 0 times per week.  Today's visit was #: 25 Starting weight: 292 lbs Starting date: 12/07/2017 Today's weight: 285 lbs Today's date: 12/07/2019 Total lbs lost to date: 7 Total lbs lost since last in-office visit: 0  Interim History: [name] has not been journaling due to lack of motivation. She will go all day without eating, then eat on large meal at night. She reports erratic sleep schedule.  Subjective:   1. Pre-diabetes [name]'s A1c on 08/07/2019 was 5.8. She is tolerating metformin well.  2. Depression with anxiety [name] is on bupropion SR 150 mg BID. She feels that it has helped reduce emotional eating only a slight amount. She reports increased depression symptoms and was unable to get into [facility] in the next 18 months.  Assessment/Plan:   1. Pre-diabetes [name] will continue to work on weight loss, exercise, and decreasing simple carbohydrates to help decrease the risk of diabetes. We will check labs today. We will refill metformin for 1 month.   - metFORMIN (GLUCOPHAGE) 500 MG tablet; TAKE 1 TABLET BY MOUTH 2 TIMES DAILY WITH A MEAL  Dispense: 60 tablet; Refill: 0  - CBC with Differential/Platelet - Comprehensive metabolic panel - Hemoglobin A1c - Insulin, random - Lipid Panel With LDL/HDL Ratio - T3 - T4, free - TSH - VITAMIN D 25 Hydroxy (Vit-D Deficiency, Fractures)  2. Depression with anxiety Behavior modification techniques were discussed today to help [name] deal with her emotional/non-hunger eating behaviors. I recommended calling [facility] to get established with  Psychiatry. Orders and follow up as documented in patient record.   3. Class 3 severe obesity with serious comorbidity and body mass index (BMI) of 50.0 to 59.9 in adult, unspecified obesity type [facility]) [name] is currently in the action stage of change. As such, her goal is to continue with weight loss efforts. She has agreed to the Category 3 Plan and keeping a food journal and adhering to recommended goals of 400-600 calories and 40+ grams of protein at supper daily.   Behavioral modification strategies: increasing lean protein intake, no skipping meals and meal planning and cooking strategies.  [name] has agreed to follow-up with our clinic in 2 to 3 weeks. She was informed of the importance of frequent follow-up visits to maximize her success with intensive lifestyle modifications for her multiple health conditions.   [name] was informed we would discuss her lab results at her next visit unless there is a critical issue that needs to be addressed sooner. [name] agreed to keep her next visit at the agreed upon time to discuss these results.  Objective:   Blood pressure 104/75, pulse 79, temperature 98 F (36.7 C), temperature source Oral, height 5\' 1"  (1.549 m), weight 285 lb (129.3 kg), SpO2 96 %. Body mass index is 53.85 kg/m.  General: Cooperative, alert, well developed, in no acute distress. HEENT: Conjunctivae and lids unremarkable. Cardiovascular: Regular rhythm.  Lungs: Normal work of breathing. Neurologic: No focal deficits.   Lab Results  Component Value Date   CREATININE 0.73 08/07/2019   BUN 12 08/07/2019   NA 140 08/07/2019  K 4.2 08/07/2019   CL 102 08/07/2019   CO2 24 08/07/2019   Lab Results  Component Value Date   ALT 39 (H) 08/07/2019   AST 32 08/07/2019   ALKPHOS 74 08/07/2019   BILITOT 0.3 08/07/2019   Lab Results  Component Value Date   HGBA1C 5.8 (H) 08/07/2019   HGBA1C 5.6 08/29/2018   HGBA1C 5.8 (H) 04/27/2018   HGBA1C 5.8 (H) 12/07/2017   HGBA1C  6.0 (H) 10/01/2017   Lab Results  Component Value Date   INSULIN 26.1 (H) 08/07/2019   INSULIN 29.5 (H) 08/29/2018   INSULIN 46.1 (H) 04/27/2018   INSULIN 20.1 12/07/2017   Lab Results  Component Value Date   TSH 0.755 08/07/2019   Lab Results  Component Value Date   CHOL 138 08/07/2019   HDL 43 08/07/2019   LDLCALC 73 08/07/2019   TRIG 121 08/07/2019   CHOLHDL 2.9 10/08/2017   Lab Results  Component Value Date   WBC 5.6 04/27/2018   HGB 13.4 04/27/2018   HCT 42.9 04/27/2018   MCV 89 04/27/2018   PLT 244 10/01/2017   No results found for: IRON, TIBC, FERRITIN  Attestation Statements:   Reviewed by clinician on day of visit: allergies, medications, problem list, medical history, surgical history, family history, social history, and previous encounter notes.   I, Trixie Dredge, am acting as transcriptionist for Dennard Nip, MD.  I have reviewed the above documentation for accuracy and completeness, and I agree with the above. -  Dennard Nip, MD

## 2019-12-08 LAB — HEMOGLOBIN A1C
Est. average glucose Bld gHb Est-mCnc: 117 mg/dL
Hgb A1c MFr Bld: 5.7 % — ABNORMAL HIGH (ref 4.8–5.6)

## 2019-12-08 LAB — T4, FREE: Free T4: 0.87 ng/dL (ref 0.82–1.77)

## 2019-12-08 LAB — CBC WITH DIFFERENTIAL/PLATELET
Basophils Absolute: 0 10*3/uL (ref 0.0–0.2)
Basos: 1 %
EOS (ABSOLUTE): 0.2 10*3/uL (ref 0.0–0.4)
Eos: 3 %
Hematocrit: 41.6 % (ref 34.0–46.6)
Hemoglobin: 14 g/dL (ref 11.1–15.9)
Immature Grans (Abs): 0 10*3/uL (ref 0.0–0.1)
Immature Granulocytes: 1 %
Lymphocytes Absolute: 1.7 10*3/uL (ref 0.7–3.1)
Lymphs: 30 %
MCH: 28.9 pg (ref 26.6–33.0)
MCHC: 33.7 g/dL (ref 31.5–35.7)
MCV: 86 fL (ref 79–97)
Monocytes Absolute: 0.4 10*3/uL (ref 0.1–0.9)
Monocytes: 7 %
Neutrophils Absolute: 3.3 10*3/uL (ref 1.4–7.0)
Neutrophils: 58 %
Platelets: 244 10*3/uL (ref 150–450)
RBC: 4.85 x10E6/uL (ref 3.77–5.28)
RDW: 12.7 % (ref 11.7–15.4)
WBC: 5.6 10*3/uL (ref 3.4–10.8)

## 2019-12-08 LAB — LIPID PANEL WITH LDL/HDL RATIO
Cholesterol, Total: 150 mg/dL (ref 100–199)
HDL: 45 mg/dL (ref >39)
LDL Chol Calc (NIH): 80 mg/dL (ref 0–99)
LDL/HDL Ratio: 1.8 ratio (ref 0.0–3.2)
Triglycerides: 143 mg/dL (ref 0–149)
VLDL Cholesterol Cal: 25 mg/dL (ref 5–40)

## 2019-12-08 LAB — COMPREHENSIVE METABOLIC PANEL
ALT: 36 IU/L — ABNORMAL HIGH (ref 0–32)
AST: 30 IU/L (ref 0–40)
Albumin/Globulin Ratio: 1.6 (ref 1.2–2.2)
Albumin: 4.3 g/dL (ref 3.8–4.8)
Alkaline Phosphatase: 65 IU/L (ref 39–117)
BUN/Creatinine Ratio: 21 (ref 9–23)
BUN: 15 mg/dL (ref 6–24)
Bilirubin Total: 0.3 mg/dL (ref 0.0–1.2)
CO2: 23 mmol/L (ref 20–29)
Calcium: 9.3 mg/dL (ref 8.7–10.2)
Chloride: 101 mmol/L (ref 96–106)
Creatinine, Ser: 0.71 mg/dL (ref 0.57–1.00)
GFR calc Af Amer: 119 mL/min/{1.73_m2} (ref >59)
GFR calc non Af Amer: 103 mL/min/{1.73_m2} (ref >59)
Globulin, Total: 2.7 g/dL (ref 1.5–4.5)
Glucose: 88 mg/dL (ref 65–99)
Potassium: 3.9 mmol/L (ref 3.5–5.2)
Sodium: 138 mmol/L (ref 134–144)
Total Protein: 7 g/dL (ref 6.0–8.5)

## 2019-12-08 LAB — VITAMIN D 25 HYDROXY (VIT D DEFICIENCY, FRACTURES): Vit D, 25-Hydroxy: 49.1 ng/mL (ref 30.0–100.0)

## 2019-12-08 LAB — INSULIN, RANDOM: INSULIN: 24.7 u[IU]/mL (ref 2.6–24.9)

## 2019-12-08 LAB — TSH: TSH: 1.67 u[IU]/mL (ref 0.450–4.500)

## 2019-12-08 LAB — T3: T3, Total: 143 ng/dL (ref 71–180)

## 2019-12-11 ENCOUNTER — Ambulatory Visit: Payer: Self-pay

## 2019-12-19 NOTE — Progress Notes (Signed)
Patient ID: [name], female    DOB: 20-Nov-1975, 44 y.o.   MRN: [id number]  PCP: Hubbard Hartshorn, FNP  Chief Complaint  Patient presents with  . Knee Pain    bilateral, right knee hurt worse  . Shoulder Pain    left shoulder    Subjective:   [name] is a 44 y.o. female, presents to clinic with CC of the following:  Chief Complaint  Patient presents with  . Knee Pain    bilateral, right knee hurt worse  . Shoulder Pain    left shoulder    HPI:  Patient is a 44 y.o. female with a significant PMH as noted in her active problem list who presents with knee pain and left shoulder pain  She notes the right knee pain has been going on for years, increased this past year, and more painful in more immediate past, to the point where she is walking when shopping, and has to stop halfway through due to the pain.  Sometimes she wakes up in the middle the night with the knee throbbing.  She had no trauma before this started.  She thinks the front of her knee area swells.  The knee does not give out on her, does not lock, although noted at times can feel like it is a little stuck when trying to move. She denied any prior major knee injury, with no surgery history in her past to the knee. She noted her left knee is a little sore on the inside, although not very problematic, and thinks may be related to putting more stress on his left knee given her right knee symptoms.  She also notes some left upper back more than shoulder pain.  She feels it above her left scapula, and on the other medial aspect of her scapula on the left.  Sometimes feels it in the trap muscle complex.  She first noticed it yesterday morning when she woke up, and remained quite painful intermittently throughout the day.  Also uncomfortable overnight into today.  Denies any numbness or tingling or pain down the left arm.  Not dropping things.  No weakness.  Symptoms increase with neck motions and shoulder motion she  had no trauma before this started.  She noted she has a history of fibromyalgia, and has tender points that are often painful with palpation, and when presses sometimes into his upper left back area where she feels the pain, and actually feels better.  She has tried ice and occasionally heat, Tylenol and ibuprofen products, and had some Flexeril and tramadol products left over from prior prescriptions, and does not seem to help.  Of note, he has been feeling more depressed recently.  She has recently been followed for her weight with Dr. Dennard Nip who recommended she contact [facility] and be seen by psychiatry.  She states she has made eye contact, and they have offered her a telemedicine visit here over the next few days, and she can call and states she will do so in the next couple days.  She was placed on Wellbutrin by Dr. Leafy Ro, and that dose increased last visit, with the patient noting that was also to help with food cravings.  She did admit feeling more "in a funk" in the recent past, feeling more down and depressed, although has no SI/HI concerns.  Her PHQ-9 today was reviewed and concerning, and reviewed that with her.  Patient Active Problem List  Diagnosis Date Noted  . Depression 02/21/2019  . Class 3 severe obesity with serious comorbidity and body mass index (BMI) of 50.0 to 59.9 in adult ([place]) 10/20/2018  . Osteopenia 09/30/2018  . Mild intermittent asthma with acute exacerbation 05/13/2018  . Allergic rhinitis 03/09/2018  . Tension headache 03/09/2018  . Other fatigue 12/07/2017  . Shortness of breath on exertion 12/07/2017  . Other specified hypothyroidism 12/07/2017  . Prediabetes 10/15/2017  . Gastroesophageal reflux disease without esophagitis 10/15/2017  . Nasal valve collapse 10/15/2017  . 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/01/2017  . Chronic midline back pain 10/01/2017  . Vitamin D deficiency 10/01/2017  . Anxiety 10/01/2017  . PCOS (polycystic  ovarian syndrome) 10/01/2017  . Severe episode of recurrent major depressive disorder, without psychotic features ([place]) 10/01/2017  . BMI 50.0-59.9, adult ([place]) 10/01/2017  . Fibromyalgia 10/01/2017  . Postoperative hypothyroidism 10/01/2017  . OSA (obstructive sleep apnea) 02/26/2014      Current Outpatient Medications:  .  acetaminophen (TYLENOL) 325 MG tablet, Take 650 mg by mouth every 6 (six) hours as needed., Disp: , Rfl:  .  albuterol (PROVENTIL HFA;VENTOLIN HFA) 108 (90 Base) MCG/ACT inhaler, Inhale 2 puffs into the lungs every 6 (six) hours as needed for wheezing or shortness of breath., Disp: 1 Inhaler, Rfl: 0 .  albuterol (PROVENTIL) (2.5 MG/3ML) 0.083% nebulizer solution, Take 3 mLs (2.5 mg total) by nebulization every 6 (six) hours as needed for wheezing or shortness of breath., Disp: 150 mL, Rfl: 1 .  ARMOUR THYROID 120 MG tablet, TAKE 1 TABLET BY MOUTH ONCE A DAY BEFOREBREAKFAST, Disp: 90 tablet, Rfl: 0 .  Azelastine-Fluticasone 137-50 MCG/ACT SUSP, Place 1 spray into the nose daily., Disp: , Rfl:  .  budesonide-formoterol (SYMBICORT) 80-4.5 MCG/ACT inhaler, Inhale 2 puffs into the lungs 2 (two) times daily., Disp: 1 Inhaler, Rfl: 3 .  buPROPion (WELLBUTRIN SR) 150 MG 12 hr tablet, Take 1 tablet (150 mg total) by mouth daily., Disp: 60 tablet, Rfl: 0 .  citalopram (CELEXA) 20 MG tablet, Take 20 mg by mouth daily.  , Disp: , Rfl:  .  clonazePAM (KLONOPIN) 0.5 MG tablet, Take 0.5 mg by mouth 3 (three) times daily as needed for anxiety., Disp: , Rfl:  .  cyclobenzaprine (FLEXERIL) 10 MG tablet, Take 0.5-1 tablets (5-10 mg total) by mouth 3 (three) times daily as needed for muscle spasms., Disp: 30 tablet, Rfl: 0 .  diclofenac (VOLTAREN) 75 MG EC tablet, TAKE 1 TABLET BY MOUTH ONCE DAILY AS NEEDED FOR MILD TO MODERATE PAIN, Disp: 30 tablet, Rfl: 0 .  estradiol (VIVELLE-DOT) 0.05 MG/24HR patch, Place 1 patch onto the skin 2 (two) times a week., Disp: , Rfl:  .  fexofenadine (ALLEGRA)  180 MG tablet, TAKE 1 TABLET BY MOUTH ONCE DAILY, Disp: 90 tablet, Rfl: 1 .  gabapentin (NEURONTIN) 100 MG capsule, Take 100 mg by mouth 3 (three) times daily., Disp: , Rfl:  .  ibuprofen (ADVIL,MOTRIN) 200 MG tablet, Take 200 mg by mouth every 6 (six) hours as needed., Disp: , Rfl:  .  metFORMIN (GLUCOPHAGE) 500 MG tablet, TAKE 1 TABLET BY MOUTH 2 TIMES DAILY WITH A MEAL, Disp: 60 tablet, Rfl: 0 .  montelukast (SINGULAIR) 10 MG tablet, TAKE 1 TABLET BY MOUTH EVERY NIGHT AT BEDTIME, Disp: 90 tablet, Rfl: 0 .  omeprazole (PRILOSEC) 20 MG capsule, Take 20 mg by mouth daily., Disp: , Rfl:  .  progesterone (PROMETRIUM) 100 MG capsule, Take 100 mg by mouth daily., Disp: ,  Rfl:  .  traMADol (ULTRAM) 50 MG tablet, Take 50 mg by mouth every 6 (six) hours as needed., Disp: , Rfl:  .  VITAMIN D, CHOLECALCIFEROL, PO, Take 50 mcg by mouth 2 (two) times daily., Disp: , Rfl:  .  zolpidem (AMBIEN) 5 MG tablet, Take 10 mg by mouth at bedtime as needed. , Disp: , Rfl:    Allergies  Allergen Reactions  . Erythromycin   . Zyrtec [Cetirizine Hcl]     Only allergic to Zyrtec D -      Past Surgical History:  Procedure Laterality Date  . ABDOMINAL HYSTERECTOMY    . CESAREAN SECTION    . CHOLECYSTECTOMY    . SPINAL FUSION    . THYROIDECTOMY    . TONSILLECTOMY    . TUBAL LIGATION       Family History  Problem Relation Age of Onset  . Cancer Maternal Uncle   . Heart disease Father   . High Cholesterol Father   . Heart disease Paternal Grandfather   . Breast cancer Maternal Grandmother   . Rheum arthritis Mother   . Obesity Mother   . Rheum arthritis Maternal Grandfather   . Rheum arthritis Maternal Aunt      Social History   Tobacco Use  . Smoking status: Former Smoker    Packs/day: 0.25    Years: 12.00    Pack years: 3.00    Types: Cigarettes    Quit date: 09/07/2004    Years since quitting: 15.2  . Smokeless tobacco: Never Used  Substance Use Topics  . Alcohol use: Yes    Comment: 4  times a year    With staff assistance, above reviewed with the patient today.  ROS: As per HPI, otherwise no specific complaints on a limited and focused system review   No results found for this or any previous visit (from the past 72 hour(s)).   PHQ2/9: Depression screen [facility] 2/9 12/20/2019 08/01/2019 03/28/2019 12/01/2018 09/30/2018  Decreased Interest 2 0 1 1 1   Down, Depressed, Hopeless 2 0 1 1 2   PHQ - 2 Score 4 0 2 2 3   Altered sleeping 3 0 2 2 3   Tired, decreased energy 3 0 2 2 3   Change in appetite 2 0 0 1 0  Feeling bad or failure about yourself  3 0 1 1 2   Trouble concentrating 1 0 1 0 2  Moving slowly or fidgety/restless 0 0 0 1 0  Suicidal thoughts 0 0 0 0 0  PHQ-9 Score 16 0 8 9 13   Difficult doing work/chores Somewhat difficult Not difficult at all Very difficult Somewhat difficult -  Some recent data might be hidden   PHQ-2/9 Result reviewed  Fall Risk: Fall Risk  12/20/2019 08/01/2019 03/28/2019 09/30/2018 07/01/2018  Falls in the past year? 0 1 1 0 No  Number falls in past yr: 0 0 1 0 -  Injury with Fall? 0 1 1 0 -  Follow up Falls evaluation completed - - Falls evaluation completed -      Objective:   Vitals:   12/20/19 1358  BP: 124/82  Pulse: 100  Resp: 16  Temp: (!) 97.5 F (36.4 C)  TempSrc: Temporal  SpO2: 97%  Weight: 289 lb 1.6 oz (131.1 kg)  Height: 5\' 1"  (1.549 m)    Body mass index is 54.62 kg/m.  Physical Exam   NAD, masked, pleasant, obese HEENT - [place]/AT, sclera anicteric, conj - non-inj'ed,  Neck - supple, had  good range of motion, although was hesitant at the extremes of lateral flexion bilaterally due to discomfort felt in the upper left posterior back.  She had no concerns with flexion and extension.  Nontender over the cervical spine. Car - RRR without m/g/r Pulm- RR and effort normal at rest, CTA without wheeze or rales Abd - soft, obese, NT diffuesly Back - no CVA tenderness, nontender over the thoracic spine, mildly tender  medial to the left scapula extending to above the left scapula towards the trap muscle complex.  (Notes that is where she feels her discomfort), nontender over the thoracic spine. Ext - Left shoulder -was nontender to palpate the anterior and posterior joint lines of the left shoulder, nontender at the [facility] joint, nontender over the clavicle and sternocleidomastoid region.  Had good range of motion of her left shoulder, with discomfort felt in the upper posterior back region at the extreme of abduction and when trying the Apley scratch test from above and below Left knee  -good range of motion, no marked crepitus, nontender palpating the patella nor with movement of the patella, Lachman negative Right Knee - Good ROM, no limitations, very minimal discomfort in the last 10 degrees of flexion, and as approached full extension, with significant crepitus noted on testing.  Minimal swelling noted surrounding the anterior kneecap region, a major joint effusion was felt unlikely  No bruising  NT with movement of the patella, no gross deformity of patella  NT suprapatella bursa region  Minimal medial joint line tenderness with palpation, no lateral joint line tenderness with palpation  Med and lat collaterals intact on testing with no gap, no pain with testing  Lachman neg  And and post drawer neg  McMurrays neg  No pain posterior, no obvious Baker's cyst no marked LE edema,  Neuro/psychiatric - affect was not flat, appropriate with conversation  Alert and oriented  Speech normal   Results for orders placed or performed in visit on 12/07/19  CBC with Differential/Platelet  Result Value Ref Range   WBC 5.6 3.4 - 10.8 x10E3/uL   RBC 4.85 3.77 - 5.28 x10E6/uL   Hemoglobin 14.0 11.1 - 15.9 g/dL   Hematocrit 41.6 34.0 - 46.6 %   MCV 86 79 - 97 fL   MCH 28.9 26.6 - 33.0 pg   MCHC 33.7 31.5 - 35.7 g/dL   RDW 12.7 11.7 - 15.4 %   Platelets 244 150 - 450 x10E3/uL   Neutrophils 58 Not Estab. %   Lymphs  30 Not Estab. %   Monocytes 7 Not Estab. %   Eos 3 Not Estab. %   Basos 1 Not Estab. %   Neutrophils Absolute 3.3 1.4 - 7.0 x10E3/uL   Lymphocytes Absolute 1.7 0.7 - 3.1 x10E3/uL   Monocytes Absolute 0.4 0.1 - 0.9 x10E3/uL   EOS (ABSOLUTE) 0.2 0.0 - 0.4 x10E3/uL   Basophils Absolute 0.0 0.0 - 0.2 x10E3/uL   Immature Granulocytes 1 Not Estab. %   Immature Grans (Abs) 0.0 0.0 - 0.1 x10E3/uL  Comprehensive metabolic panel  Result Value Ref Range   Glucose 88 65 - 99 mg/dL   BUN 15 6 - 24 mg/dL   Creatinine, Ser 0.71 0.57 - 1.00 mg/dL   GFR calc non Af Amer 103 >59 mL/min/1.73   GFR calc Af Amer 119 >59 mL/min/1.73   BUN/Creatinine Ratio 21 9 - 23   Sodium 138 134 - 144 mmol/L   Potassium 3.9 3.5 - 5.2 mmol/L   Chloride 101  96 - 106 mmol/L   CO2 23 20 - 29 mmol/L   Calcium 9.3 8.7 - 10.2 mg/dL   Total Protein 7.0 6.0 - 8.5 g/dL   Albumin 4.3 3.8 - 4.8 g/dL   Globulin, Total 2.7 1.5 - 4.5 g/dL   Albumin/Globulin Ratio 1.6 1.2 - 2.2   Bilirubin Total 0.3 0.0 - 1.2 mg/dL   Alkaline Phosphatase 65 39 - 117 IU/L   AST 30 0 - 40 IU/L   ALT 36 (H) 0 - 32 IU/L  Hemoglobin A1c  Result Value Ref Range   Hgb A1c MFr Bld 5.7 (H) 4.8 - 5.6 %   Est. average glucose Bld gHb Est-mCnc 117 mg/dL  Insulin, random  Result Value Ref Range   INSULIN 24.7 2.6 - 24.9 uIU/mL  Lipid Panel With LDL/HDL Ratio  Result Value Ref Range   Cholesterol, Total 150 100 - 199 mg/dL   Triglycerides 143 0 - 149 mg/dL   HDL 45 >39 mg/dL   VLDL Cholesterol Cal 25 5 - 40 mg/dL   LDL Chol Calc (NIH) 80 0 - 99 mg/dL   LDL/HDL Ratio 1.8 0.0 - 3.2 ratio  T3  Result Value Ref Range   T3, Total 143 71 - 180 ng/dL  T4, free  Result Value Ref Range   Free T4 0.87 0.82 - 1.77 ng/dL  TSH  Result Value Ref Range   TSH 1.670 0.450 - 4.500 uIU/mL  VITAMIN D 25 Hydroxy (Vit-D Deficiency, Fractures)  Result Value Ref Range   Vit D, 25-Hydroxy 49.1 30.0 - 100.0 ng/mL       Assessment & Plan:   1. Acute pain of  right knee Educated, do feel patient has a very strong component of patellofemoral dysfunction as the source of her pain.  Marked crepitus on exam today.  Cannot exclude a mild meniscal component, and discussed she was at higher risk for this, especially noting her increased weight and the increase stresses across the joint as a result.  Encouraged it is not locking, and on exam, a large bucket-handle tear of the meniscus was felt unlikely  Rec'ed liberal use of ice short term  Can use a sleeve for support over time as improving when out and about,  Will get an x-ray today  Activity modifications important short-term, ice and elevate important.   Can use a generic naproxen product-220 mg, can take 2 tabs up to twice daily with food as needed to help with discomfort and inflammation.  Reviewed the PFS exercises to do and begin as improving pending x-ray review (wall squats, towel between the knees, and terminal extensions)  F/u if not improving or worsening, may need further imaging, PT or ortho input pending status over this time.  Do note it would take more than a few days for this to gradually improve, more likely weeks,   - DG Knee Complete 4 Views Right; Future  2. Chronic pain of right knee Do feel this is more acute on chronic, but has a chronic component and noted may have a meniscal component to her discomfort as well.  Await response from above.  3. Scapular dysfunction Educated and do feel she has a scapular thoracic dysfunction component with the symptoms in her left upper back area.  Does not have a pinched nerve component presently. She has done well with Flexeril in the past, and can use Flexeril up to 3 times a day as needed, although encouraged to take mainly at bedtime as may make  drowsy during the day.  Also the generic naproxen will help with this, and encouraged a contrast therapy approach with warm compresses, followed by gentle range of motion exercises, followed by ice to help  improve over time. Would not rush to an x-ray presently.  She had no prior trauma before onset. - cyclobenzaprine (FLEXERIL) 10 MG tablet; Take 0.5-1 tablets (5-10 mg total) by mouth 3 (three) times daily as needed for muscle spasms. Take mainly at bedtime as may make drowsy  Dispense: 30 tablet; Refill: 1  4. BMI 50.0-59.9, adult [facility]) He is currently being followed in a weight management clinic to help with weight loss, with weight loss helpful over time as well.  5. Fibromyalgia She does have a history of fibromyalgia, which can contribute to some of her upper back symptoms, although do feel this is more than her typical fibromyalgia  6.  Depression As noted above, has contacted [facility] mental health and will be seen by them via telemedicine visit this week to further help.  She also was started on Wellbutrin by her physician at the weight management clinic.  I encouraged that follow-up with [facility] mental health today, and she plans to do so.  She will follow-up if symptoms are not improving or more problematic, and await the x-ray result presently    Towanda Malkin, MD 12/20/19 2:05 PM

## 2019-12-20 ENCOUNTER — Ambulatory Visit
Admission: RE | Admit: 2019-12-20 | Discharge: 2019-12-20 | Disposition: A | Discharge status: Home / Self Care | Payer: Medicaid Other | Source: Ambulatory Visit | Attending: Internal Medicine | Admitting: Internal Medicine

## 2019-12-20 ENCOUNTER — Other Ambulatory Visit: Payer: Self-pay

## 2019-12-20 ENCOUNTER — Ambulatory Visit: Payer: Medicaid Other | Admitting: Internal Medicine

## 2019-12-20 ENCOUNTER — Ambulatory Visit
Admission: RE | Admit: 2019-12-20 | Discharge: 2019-12-20 | Disposition: A | Discharge status: Home / Self Care | Payer: Medicaid Other | Attending: Internal Medicine | Admitting: Internal Medicine

## 2019-12-20 ENCOUNTER — Encounter: Payer: Self-pay | Admitting: Internal Medicine

## 2019-12-20 VITALS — BP 124/82 | HR 100 | Temp 97.5°F | Resp 16 | Ht 61.0 in | Wt 289.1 lb

## 2019-12-20 DIAGNOSIS — F321 Major depressive disorder, single episode, moderate: Secondary | ICD-10-CM | POA: Diagnosis not present

## 2019-12-20 DIAGNOSIS — M25561 Pain in right knee: Secondary | ICD-10-CM

## 2019-12-20 DIAGNOSIS — G8929 Other chronic pain: Secondary | ICD-10-CM

## 2019-12-20 DIAGNOSIS — Z6841 Body Mass Index (BMI) 40.0 and over, adult: Secondary | ICD-10-CM

## 2019-12-20 DIAGNOSIS — M899 Disorder of bone, unspecified: Secondary | ICD-10-CM | POA: Diagnosis not present

## 2019-12-20 DIAGNOSIS — M25512 Pain in left shoulder: Secondary | ICD-10-CM

## 2019-12-20 DIAGNOSIS — M797 Fibromyalgia: Secondary | ICD-10-CM | POA: Diagnosis not present

## 2019-12-20 MED ORDER — CYCLOBENZAPRINE HCL 10 MG PO TABS: 5.0000 mg | ORAL_TABLET | Freq: Three times a day (TID) | ORAL | 1 refills | Status: DC | PRN

## 2019-12-20 NOTE — Patient Instructions (Signed)
Can take a generic naproxen product (Aleve) -220 mg tablets, can take 2 tablets up to twice daily with food as needed for pain and inflammation.  Please get the knee x-ray today  Acute Knee Pain, Adult Many things can cause knee pain. Sometimes, knee pain is sudden (acute) and may be caused by damage, swelling, or irritation of the muscles and tissues that support your knee. The pain often goes away on its own with time and rest. If the pain does not go away, tests may be done to find out what is causing the pain. Follow these instructions at home: Pay attention to any changes in your symptoms. Take these actions to relieve your pain. If you have a knee sleeve or brace:   Wear the sleeve or brace as told by your doctor. Remove it only as told by your doctor.  Loosen the sleeve or brace if your toes: ? Tingle. ? Become numb. ? Turn cold and blue.  Keep the sleeve or brace clean.  If the sleeve or brace is not waterproof: ? Do not let it get wet. ? Cover it with a watertight covering when you take a bath or shower. Activity  Rest your knee.  Do not do things that cause pain.  Avoid activities where both feet leave the ground at the same time (high-impact activities). Examples are running, jumping rope, and doing jumping jacks.  Work with a physical therapist to make a safe exercise program, as told by your doctor. Managing pain, stiffness, and swelling   If told, put ice on the knee: ? Put ice in a plastic bag. ? Place a towel between your skin and the bag. ? Leave the ice on for 20 minutes, 2-3 times a day.  If told, put pressure (compression) on your injured knee to control swelling, give support, and help with discomfort. Compression may be done with an elastic bandage. General instructions  Take all medicines only as told by your doctor.  Raise (elevate) your knee while you are sitting or lying down. Make sure your knee is higher than your heart.  Sleep with a pillow  under your knee.  Do not use any products that contain nicotine or tobacco. These include cigarettes, e-cigarettes, and chewing tobacco. These products may slow down healing. If you need help quitting, ask your doctor.  If you are overweight, work with your doctor and a food expert (dietitian) to set goals to lose weight. Being overweight can make your knee hurt more.  Keep all follow-up visits as told by your doctor. This is important. Contact a doctor if:  The knee pain does not stop.  The knee pain changes or gets worse.  You have a fever along with knee pain.  Your knee feels warm when you touch it.  Your knee gives out or locks up. Get help right away if:  Your knee swells, and the swelling gets worse.  You cannot move your knee.  You have very bad knee pain. Summary  Many things can cause knee pain. The pain often goes away on its own with time and rest.  Your doctor may do tests to find out the cause of the pain.  Pay attention to any changes in your symptoms. Relieve your pain with rest, medicines, light activity, and use of ice.  Get help right away if you cannot move your knee or your knee pain is very bad. This information is not intended to replace advice given to you by your  health care provider. Make sure you discuss any questions you have with your health care provider. Document Revised: 02/03/2018 Document Reviewed: 02/03/2018 Elsevier Patient Education  [place].

## 2019-12-21 ENCOUNTER — Other Ambulatory Visit: Payer: Self-pay | Admitting: Family Medicine

## 2019-12-21 DIAGNOSIS — J309 Allergic rhinitis, unspecified: Secondary | ICD-10-CM

## 2020-01-01 ENCOUNTER — Encounter (INDEPENDENT_AMBULATORY_CARE_PROVIDER_SITE_OTHER): Payer: Self-pay | Admitting: Family Medicine

## 2020-01-01 ENCOUNTER — Other Ambulatory Visit: Payer: Self-pay

## 2020-01-01 ENCOUNTER — Ambulatory Visit (INDEPENDENT_AMBULATORY_CARE_PROVIDER_SITE_OTHER): Payer: Medicaid Other | Admitting: Family Medicine

## 2020-01-01 VITALS — BP 116/79 | HR 96 | Temp 98.2°F | Ht 61.0 in | Wt 288.0 lb

## 2020-01-01 DIAGNOSIS — Z6841 Body Mass Index (BMI) 40.0 and over, adult: Secondary | ICD-10-CM

## 2020-01-01 DIAGNOSIS — R7303 Prediabetes: Secondary | ICD-10-CM

## 2020-01-01 DIAGNOSIS — F418 Other specified anxiety disorders: Secondary | ICD-10-CM | POA: Diagnosis not present

## 2020-01-01 MED ORDER — BUPROPION HCL ER (SR) 150 MG PO TB12: 150.0000 mg | ORAL_TABLET | Freq: Every day | ORAL | 0 refills | Status: DC

## 2020-01-01 MED ORDER — METFORMIN HCL 500 MG PO TABS: ORAL_TABLET | ORAL | 0 refills | Status: DC

## 2020-01-03 NOTE — Progress Notes (Signed)
Chief Complaint:   OBESITY [name] is here to discuss her progress with her obesity treatment plan along with follow-up of her obesity related diagnoses. [name] is on the Category 3 Plan and keeping a food journal and adhering to recommended goals of 400-600 calories and 40+ grams of protein at supper daily and states she is following her eating plan approximately 50% of the time. [name] states she is doing 0 minutes 0 times per week.  Today's visit was #: 44 Starting weight: 292 lbs Starting date: 12/07/2017 Today's weight: 288 lbs Today's date: 01/01/2020 Total lbs lost to date: 4 Total lbs lost since last in-office visit: 0  Interim History: [name] has struggled more with meal planning and staying on track. She is under a lot of stress and being pulled in multiple directions , making change more difficult.  Subjective:   1. Pre-diabetes [name]'s A1c is improving on metformin. She denies nausea, vomiting, or hypoglycemia.  2. Depression with anxiety [name] is stable on her medications, and she is working on emotional eating but dealing with a lot of stress and feeling overwhelmed.  Assessment/Plan:   1. Pre-diabetes Issys will continue to work on weight loss, diet, exercise, and decreasing simple carbohydrates to help decrease the risk of diabetes. We will refill metformin for 1 month.  - metFORMIN (GLUCOPHAGE) 500 MG tablet; TAKE 1 TABLET BY MOUTH 2 TIMES DAILY WITH A MEAL  Dispense: 60 tablet; Refill: 0  2. Depression with anxiety Behavior modification techniques were discussed today to help [name] deal with her emotional/non-hunger eating behaviors. We will refill Wellbutrin SR for 1 month. She will continue to work on getting into [facility] for therapy. Orders and follow up as documented in patient record.   - buPROPion (WELLBUTRIN SR) 150 MG 12 hr tablet; Take 1 tablet (150 mg total) by mouth daily.  Dispense: 60 tablet; Refill: 0  3. Class 3 severe obesity with serious  comorbidity and body mass index (BMI) of 50.0 to 59.9 in adult, unspecified obesity type [facility]) [name] is currently in the action stage of change. As such, her goal is to maintain weight for now. She has agreed to the Category 3 Plan.   [name]'s goal is to maintain her weight this month and will continue to monitor.  Behavioral modification strategies: meal planning and cooking strategies and emotional eating strategies.  [name] has agreed to follow-up with our clinic in 3 to 4 weeks. She was informed of the importance of frequent follow-up visits to maximize her success with intensive lifestyle modifications for her multiple health conditions.   Objective:   Blood pressure 116/79, pulse 96, temperature 98.2 F (36.8 C), temperature source Oral, height 5\' 1"  (1.549 m), weight 288 lb (130.6 kg), SpO2 95 %. Body mass index is 54.42 kg/m.  General: Cooperative, alert, well developed, in no acute distress. HEENT: Conjunctivae and lids unremarkable. Cardiovascular: Regular rhythm.  Lungs: Normal work of breathing. Neurologic: No focal deficits.   Lab Results  Component Value Date   CREATININE 0.71 12/07/2019   BUN 15 12/07/2019   NA 138 12/07/2019   K 3.9 12/07/2019   CL 101 12/07/2019   CO2 23 12/07/2019   Lab Results  Component Value Date   ALT 36 (H) 12/07/2019   AST 30 12/07/2019   ALKPHOS 65 12/07/2019   BILITOT 0.3 12/07/2019   Lab Results  Component Value Date   HGBA1C 5.7 (H) 12/07/2019   HGBA1C 5.8 (H) 08/07/2019   HGBA1C 5.6 08/29/2018  HGBA1C 5.8 (H) 04/27/2018   HGBA1C 5.8 (H) 12/07/2017   Lab Results  Component Value Date   INSULIN 24.7 12/07/2019   INSULIN 26.1 (H) 08/07/2019   INSULIN 29.5 (H) 08/29/2018   INSULIN 46.1 (H) 04/27/2018   INSULIN 20.1 12/07/2017   Lab Results  Component Value Date   TSH 1.670 12/07/2019   Lab Results  Component Value Date   CHOL 150 12/07/2019   HDL 45 12/07/2019   LDLCALC 80 12/07/2019   TRIG 143 12/07/2019    CHOLHDL 2.9 10/08/2017   Lab Results  Component Value Date   WBC 5.6 12/07/2019   HGB 14.0 12/07/2019   HCT 41.6 12/07/2019   MCV 86 12/07/2019   PLT 244 12/07/2019   No results found for: IRON, TIBC, FERRITIN  Attestation Statements:   Reviewed by clinician on day of visit: allergies, medications, problem list, medical history, surgical history, family history, social history, and previous encounter notes.   I, Trixie Dredge, am acting as transcriptionist for Dennard Nip, MD.  I have reviewed the above documentation for accuracy and completeness, and I agree with the above. -  Dennard Nip, MD

## 2020-01-23 ENCOUNTER — Ambulatory Visit (INDEPENDENT_AMBULATORY_CARE_PROVIDER_SITE_OTHER): Payer: Medicaid Other | Admitting: Family Medicine

## 2020-01-25 ENCOUNTER — Other Ambulatory Visit (INDEPENDENT_AMBULATORY_CARE_PROVIDER_SITE_OTHER): Payer: Self-pay

## 2020-01-25 ENCOUNTER — Other Ambulatory Visit: Payer: Self-pay | Admitting: Family Medicine

## 2020-01-25 ENCOUNTER — Encounter (INDEPENDENT_AMBULATORY_CARE_PROVIDER_SITE_OTHER): Payer: Self-pay | Admitting: Family Medicine

## 2020-01-25 DIAGNOSIS — R7303 Prediabetes: Secondary | ICD-10-CM

## 2020-01-25 DIAGNOSIS — F418 Other specified anxiety disorders: Secondary | ICD-10-CM

## 2020-01-25 MED ORDER — BUPROPION HCL ER (SR) 150 MG PO TB12: 150.0000 mg | ORAL_TABLET | Freq: Every day | ORAL | 0 refills | Status: DC

## 2020-01-25 MED ORDER — METFORMIN HCL 500 MG PO TABS: ORAL_TABLET | ORAL | 0 refills | Status: DC

## 2020-02-02 DIAGNOSIS — B349 Viral infection, unspecified: Secondary | ICD-10-CM | POA: Diagnosis not present

## 2020-02-02 DIAGNOSIS — R197 Diarrhea, unspecified: Secondary | ICD-10-CM | POA: Diagnosis not present

## 2020-02-02 DIAGNOSIS — Z20822 Contact with and (suspected) exposure to covid-19: Secondary | ICD-10-CM | POA: Diagnosis not present

## 2020-02-11 ENCOUNTER — Encounter (INDEPENDENT_AMBULATORY_CARE_PROVIDER_SITE_OTHER): Payer: Self-pay | Admitting: Family Medicine

## 2020-02-12 NOTE — Telephone Encounter (Signed)
Seen 4/14

## 2020-02-14 ENCOUNTER — Ambulatory Visit (INDEPENDENT_AMBULATORY_CARE_PROVIDER_SITE_OTHER): Payer: Medicaid Other | Admitting: Family Medicine

## 2020-02-21 DIAGNOSIS — Z23 Encounter for immunization: Secondary | ICD-10-CM | POA: Diagnosis not present

## 2020-02-28 ENCOUNTER — Encounter (INDEPENDENT_AMBULATORY_CARE_PROVIDER_SITE_OTHER): Payer: Self-pay | Admitting: Family Medicine

## 2020-03-04 ENCOUNTER — Ambulatory Visit (INDEPENDENT_AMBULATORY_CARE_PROVIDER_SITE_OTHER): Payer: Medicaid Other | Admitting: Family Medicine

## 2020-03-04 ENCOUNTER — Other Ambulatory Visit: Payer: Self-pay

## 2020-03-04 ENCOUNTER — Encounter (INDEPENDENT_AMBULATORY_CARE_PROVIDER_SITE_OTHER): Payer: Self-pay | Admitting: Family Medicine

## 2020-03-04 VITALS — BP 135/74 | HR 88 | Temp 97.5°F | Ht 61.0 in | Wt 289.0 lb

## 2020-03-04 DIAGNOSIS — R7303 Prediabetes: Secondary | ICD-10-CM

## 2020-03-04 DIAGNOSIS — F3289 Other specified depressive episodes: Secondary | ICD-10-CM | POA: Diagnosis not present

## 2020-03-04 DIAGNOSIS — Z6841 Body Mass Index (BMI) 40.0 and over, adult: Secondary | ICD-10-CM | POA: Diagnosis not present

## 2020-03-04 MED ORDER — METFORMIN HCL 500 MG PO TABS: ORAL_TABLET | ORAL | 0 refills | Status: DC

## 2020-03-04 MED ORDER — BUPROPION HCL ER (SR) 150 MG PO TB12: 150.0000 mg | ORAL_TABLET | Freq: Every day | ORAL | 0 refills | Status: DC

## 2020-03-05 NOTE — Progress Notes (Signed)
Chief Complaint:   OBESITY [name] is here to discuss her progress with her obesity treatment plan along with follow-up of her obesity related diagnoses. [name] is on the Category 3 Plan and states she is following her eating plan approximately 25% of the time. [name] states she is doing 0 minutes 0 times per week.  Today's visit was #: 41 Starting weight: 292 lbs Starting date: 12/07/2017 Today's weight: 289 lbs Today's date: 03/04/2020 Total lbs lost to date: 3 Total lbs lost since last in-office visit: 0  Interim History: [name] is struggling with weight loss. She went on vacation and did some celebration eating and then struggled to get back on track. She went longer in between visits and she feels she does better bing seen every 2-3 weeks.  Subjective:   1. Pre-diabetes [name] is stable on metformin, and she requests a refill today. She denies nausea or vomiting.   2. Other depression with emotional eating [name] did some celebration eating over her vacation and she struggled to get back on track. She is stable on Wellbutrin and her blood pressure is controlled.  Assessment/Plan:   1. Pre-diabetes [name] will continue to work on weight loss, exercise, and decreasing simple carbohydrates to help decrease the risk of diabetes. We will refill metformin for 1 month.  - metFORMIN (GLUCOPHAGE) 500 MG tablet; TAKE 1 TABLET BY MOUTH 2 TIMES DAILY WITH A MEAL  Dispense: 60 tablet; Refill: 0  2. Other depression with emotional eating Emotional eating strategies were discussed today to help [name] deal with her emotional/non-hunger eating behaviors. We will refill bupropion for 1 month . Orders and follow up as documented in patient record.   - buPROPion (WELLBUTRIN SR) 150 MG 12 hr tablet; Take 1 tablet (150 mg total) by mouth daily.  Dispense: 60 tablet; Refill: 0  3. Class 3 severe obesity with serious comorbidity and body mass index (BMI) of 50.0 to 59.9 in adult, unspecified obesity  type [facility]) [name] is currently in the action stage of change. As such, her goal is to continue with weight loss efforts. She has agreed to keeping a food journal and adhering to recommended goals of 1400-1600 calories and 90+ grams of protein daily.   Behavioral modification strategies: increasing lean protein intake and emotional eating strategies.  [name] has agreed to follow-up with our clinic in 2 to 3 weeks. She was informed of the importance of frequent follow-up visits to maximize her success with intensive lifestyle modifications for her multiple health conditions.   Objective:   Blood pressure 135/74, pulse 88, temperature (!) 97.5 F (36.4 C), temperature source Oral, height 5\' 1"  (1.549 m), weight 289 lb (131.1 kg), SpO2 97 %. Body mass index is 54.61 kg/m.  General: Cooperative, alert, well developed, in no acute distress. HEENT: Conjunctivae and lids unremarkable. Cardiovascular: Regular rhythm.  Lungs: Normal work of breathing. Neurologic: No focal deficits.   Lab Results  Component Value Date   CREATININE 0.71 12/07/2019   BUN 15 12/07/2019   NA 138 12/07/2019   K 3.9 12/07/2019   CL 101 12/07/2019   CO2 23 12/07/2019   Lab Results  Component Value Date   ALT 36 (H) 12/07/2019   AST 30 12/07/2019   ALKPHOS 65 12/07/2019   BILITOT 0.3 12/07/2019   Lab Results  Component Value Date   HGBA1C 5.7 (H) 12/07/2019   HGBA1C 5.8 (H) 08/07/2019   HGBA1C 5.6 08/29/2018   HGBA1C 5.8 (H) 04/27/2018   HGBA1C 5.8 (  H) 12/07/2017   Lab Results  Component Value Date   INSULIN 24.7 12/07/2019   INSULIN 26.1 (H) 08/07/2019   INSULIN 29.5 (H) 08/29/2018   INSULIN 46.1 (H) 04/27/2018   INSULIN 20.1 12/07/2017   Lab Results  Component Value Date   TSH 1.670 12/07/2019   Lab Results  Component Value Date   CHOL 150 12/07/2019   HDL 45 12/07/2019   LDLCALC 80 12/07/2019   TRIG 143 12/07/2019   CHOLHDL 2.9 10/08/2017   Lab Results  Component Value Date   WBC 5.6  12/07/2019   HGB 14.0 12/07/2019   HCT 41.6 12/07/2019   MCV 86 12/07/2019   PLT 244 12/07/2019   No results found for: IRON, TIBC, FERRITIN   Attestation Statements:   Reviewed by clinician on day of visit: allergies, medications, problem list, medical history, surgical history, family history, social history, and previous encounter notes.   I, Trixie Dredge, am acting as transcriptionist for Dennard Nip, MD.  I have reviewed the above documentation for accuracy and completeness, and I agree with the above. -  Dennard Nip, MD

## 2020-03-07 DIAGNOSIS — Z419 Encounter for procedure for purposes other than remedying health state, unspecified: Secondary | ICD-10-CM | POA: Diagnosis not present

## 2020-03-20 ENCOUNTER — Other Ambulatory Visit: Payer: Self-pay

## 2020-03-20 ENCOUNTER — Ambulatory Visit (INDEPENDENT_AMBULATORY_CARE_PROVIDER_SITE_OTHER): Payer: Medicaid Other | Admitting: Family Medicine

## 2020-03-20 DIAGNOSIS — E038 Other specified hypothyroidism: Secondary | ICD-10-CM

## 2020-03-20 MED ORDER — ARMOUR THYROID 120 MG PO TABS: ORAL_TABLET | ORAL | 2 refills | Status: DC

## 2020-03-25 ENCOUNTER — Other Ambulatory Visit: Payer: Self-pay

## 2020-03-25 ENCOUNTER — Ambulatory Visit (INDEPENDENT_AMBULATORY_CARE_PROVIDER_SITE_OTHER): Payer: Medicaid Other | Admitting: Family Medicine

## 2020-03-25 ENCOUNTER — Encounter (INDEPENDENT_AMBULATORY_CARE_PROVIDER_SITE_OTHER): Payer: Self-pay | Admitting: Family Medicine

## 2020-03-25 VITALS — BP 118/79 | HR 85 | Temp 98.0°F | Ht 61.0 in | Wt 289.0 lb

## 2020-03-25 DIAGNOSIS — G4709 Other insomnia: Secondary | ICD-10-CM | POA: Diagnosis not present

## 2020-03-25 DIAGNOSIS — R7303 Prediabetes: Secondary | ICD-10-CM | POA: Diagnosis not present

## 2020-03-25 DIAGNOSIS — Z6841 Body Mass Index (BMI) 40.0 and over, adult: Secondary | ICD-10-CM | POA: Diagnosis not present

## 2020-03-25 MED ORDER — METFORMIN HCL 500 MG PO TABS: ORAL_TABLET | ORAL | 0 refills | Status: DC

## 2020-03-27 NOTE — Progress Notes (Signed)
Chief Complaint:   OBESITY [name] is here to discuss her progress with her obesity treatment plan along with follow-up of her obesity related diagnoses. [name] is on keeping a food journal and adhering to recommended goals of 1400-1600 calories and 90+ grams of protein daily and states she is following her eating plan approximately 60% of the time. [name] states she is doing 0 minutes 0 times per week.  Today's visit was #: 25 Starting weight: 292 lbs Starting date: 12/07/2017 Today's weight: 289 lbs Today's date: 03/25/2020 Total lbs lost to date: 3 Total lbs lost since last in-office visit: 0  Interim History: [name] did well maintaining her weight on vacation. She feels she lost the first week but then regained the next. She is ready to get back on track.  Subjective:   1. Pre-diabetes [name] is stable on metformin, and she denies nausea or vomiting. Her last A1c was 5.7.  2. Other insomnia [name] notes sleeping has improved on melatonin, and she rarely needs to use Ambien.  Assessment/Plan:   1. Pre-diabetes [name] will continue to work on weight loss, exercise, and decreasing simple carbohydrates to help decrease the risk of diabetes. We will refill metformin for 1 month.  - metFORMIN (GLUCOPHAGE) 500 MG tablet; TAKE 1 TABLET BY MOUTH 2 TIMES DAILY WITH A MEAL  Dispense: 60 tablet; Refill: 0  2. Other insomnia The problem of recurrent insomnia was discussed. Orders and follow up as documented in patient record. Counseling: Intensive lifestyle modifications are the first line treatment for this issue. We discussed several lifestyle modifications today. [name] will continue melatonin OTC, and will continue to work on diet, exercise and weight loss efforts.   3. Class 3 severe obesity with serious comorbidity and body mass index (BMI) of 50.0 to 59.9 in adult, unspecified obesity type [name]) [name] is currently in the action stage of change. As such, her goal is to continue with weight  loss efforts. She has agreed to keeping a food journal and adhering to recommended goals of 1400-1600 calories and 90+ grams of protein daily.   Behavioral modification strategies: increasing lean protein intake.  [name] has agreed to follow-up with our clinic in 2 to 3 weeks. She was informed of the importance of frequent follow-up visits to maximize her success with intensive lifestyle modifications for her multiple health conditions.   Objective:   Blood pressure 118/79, pulse 85, temperature 98 F (36.7 C), temperature source Oral, height 5\' 1"  (1.549 m), weight 289 lb (131.1 kg), SpO2 96 %. Body mass index is 54.61 kg/m.  General: Cooperative, alert, well developed, in no acute distress. HEENT: Conjunctivae and lids unremarkable. Cardiovascular: Regular rhythm.  Lungs: Normal work of breathing. Neurologic: No focal deficits.   Lab Results  Component Value Date   CREATININE 0.71 12/07/2019   BUN 15 12/07/2019   NA 138 12/07/2019   K 3.9 12/07/2019   CL 101 12/07/2019   CO2 23 12/07/2019   Lab Results  Component Value Date   ALT 36 (H) 12/07/2019   AST 30 12/07/2019   ALKPHOS 65 12/07/2019   BILITOT 0.3 12/07/2019   Lab Results  Component Value Date   HGBA1C 5.7 (H) 12/07/2019   HGBA1C 5.8 (H) 08/07/2019   HGBA1C 5.6 08/29/2018   HGBA1C 5.8 (H) 04/27/2018   HGBA1C 5.8 (H) 12/07/2017   Lab Results  Component Value Date   INSULIN 24.7 12/07/2019   INSULIN 26.1 (H) 08/07/2019   INSULIN 29.5 (H) 08/29/2018  INSULIN 46.1 (H) 04/27/2018   INSULIN 20.1 12/07/2017   Lab Results  Component Value Date   TSH 1.670 12/07/2019   Lab Results  Component Value Date   CHOL 150 12/07/2019   HDL 45 12/07/2019   LDLCALC 80 12/07/2019   TRIG 143 12/07/2019   CHOLHDL 2.9 10/08/2017   Lab Results  Component Value Date   WBC 5.6 12/07/2019   HGB 14.0 12/07/2019   HCT 41.6 12/07/2019   MCV 86 12/07/2019   PLT 244 12/07/2019   No results found for: IRON, TIBC,  FERRITIN  Attestation Statements:   Reviewed by clinician on day of visit: allergies, medications, problem list, medical history, surgical history, family history, social history, and previous encounter notes.   I, Trixie Dredge, am acting as transcriptionist for Dennard Nip, MD.  I have reviewed the above documentation for accuracy and completeness, and I agree with the above. -  Dennard Nip, MD

## 2020-03-29 ENCOUNTER — Other Ambulatory Visit: Payer: Self-pay

## 2020-03-29 DIAGNOSIS — H9201 Otalgia, right ear: Secondary | ICD-10-CM

## 2020-03-29 DIAGNOSIS — J309 Allergic rhinitis, unspecified: Secondary | ICD-10-CM

## 2020-03-29 DIAGNOSIS — Z8669 Personal history of other diseases of the nervous system and sense organs: Secondary | ICD-10-CM

## 2020-03-29 MED ORDER — MONTELUKAST SODIUM 10 MG PO TABS: 10.0000 mg | ORAL_TABLET | Freq: Every day | ORAL | 1 refills | Status: DC

## 2020-03-29 MED ORDER — FEXOFENADINE HCL 180 MG PO TABS: 180.0000 mg | ORAL_TABLET | Freq: Every day | ORAL | 1 refills | Status: DC

## 2020-04-01 ENCOUNTER — Telehealth: Payer: Self-pay | Admitting: Family Medicine

## 2020-04-01 NOTE — Telephone Encounter (Signed)
Pt needs a PA for ARMOUR THYROID 120 MG tablet  /pt paid cash for the refill at pharmacy but needs PA done for next time / please advise   Pt also needs an appt to establish care with a new provider

## 2020-04-01 NOTE — Telephone Encounter (Signed)
Authorization initiated, waiting for reply.

## 2020-04-07 DIAGNOSIS — Z419 Encounter for procedure for purposes other than remedying health state, unspecified: Secondary | ICD-10-CM | POA: Diagnosis not present

## 2020-04-15 ENCOUNTER — Ambulatory Visit (INDEPENDENT_AMBULATORY_CARE_PROVIDER_SITE_OTHER): Payer: Medicaid Other | Admitting: Family Medicine

## 2020-04-16 DIAGNOSIS — F322 Major depressive disorder, single episode, severe without psychotic features: Secondary | ICD-10-CM | POA: Diagnosis not present

## 2020-04-22 ENCOUNTER — Ambulatory Visit (INDEPENDENT_AMBULATORY_CARE_PROVIDER_SITE_OTHER): Payer: Medicaid Other | Admitting: Family Medicine

## 2020-04-29 ENCOUNTER — Encounter (INDEPENDENT_AMBULATORY_CARE_PROVIDER_SITE_OTHER): Payer: Self-pay | Admitting: Family Medicine

## 2020-04-29 ENCOUNTER — Other Ambulatory Visit: Payer: Self-pay

## 2020-04-29 ENCOUNTER — Ambulatory Visit (INDEPENDENT_AMBULATORY_CARE_PROVIDER_SITE_OTHER): Payer: Medicaid Other | Admitting: Family Medicine

## 2020-04-29 VITALS — BP 109/76 | HR 85 | Temp 97.9°F | Ht 61.0 in | Wt 288.0 lb

## 2020-04-29 DIAGNOSIS — R7303 Prediabetes: Secondary | ICD-10-CM | POA: Diagnosis not present

## 2020-04-29 DIAGNOSIS — Z6841 Body Mass Index (BMI) 40.0 and over, adult: Secondary | ICD-10-CM

## 2020-04-29 DIAGNOSIS — F418 Other specified anxiety disorders: Secondary | ICD-10-CM | POA: Diagnosis not present

## 2020-04-29 MED ORDER — BUPROPION HCL ER (SR) 150 MG PO TB12: 150.0000 mg | ORAL_TABLET | Freq: Every day | ORAL | 0 refills | Status: DC

## 2020-04-29 MED ORDER — METFORMIN HCL 500 MG PO TABS: ORAL_TABLET | ORAL | 0 refills | Status: DC

## 2020-04-29 NOTE — Progress Notes (Signed)
Chief Complaint:   OBESITY [name] is here to discuss her progress with her obesity treatment plan along with follow-up of her obesity related diagnoses. [name] is on keeping a food journal and adhering to recommended goals of 1400-1600 calories and 90+ grams of protein daily and states she is following her eating plan approximately 50% of the time. [name] states she is doing 0 minutes 0 times per week.  Today's visit was #: 50 Starting weight: 292 lbs Starting date: 12/07/2017 Today's weight: 288 lbs Today's date: 04/29/2020 Total lbs lost to date: 4 Total lbs lost since last in-office visit: 1  Interim History: [name] has extra temptations and celebrations, but she still was able to lose 1 lb. She has a lot of stress in her family life, and she hasn't been able to concentrate on journaling but she has been mindful of her food choices.  Subjective:   1. Pre-diabetes [name] is stable on metformin, and she denies nausea, or vomiting. She is doing well monitoring her intake and weight loss.  2. Depression with anxiety [name] is stable on Wellbutrin, and she has her first appointment to start therapy to help deal with her stressors and family strife.  Assessment/Plan:   1. Pre-diabetes [name] will continue to work on weight loss, exercise, and decreasing simple carbohydrates to help decrease the risk of diabetes. We will refill metformin for 1 month.  - metFORMIN (GLUCOPHAGE) 500 MG tablet; TAKE 1 TABLET BY MOUTH 2 TIMES DAILY WITH A MEAL  Dispense: 60 tablet; Refill: 0  2. Depression with anxiety Behavior modification techniques were discussed today to help [name] deal with her emotional/non-hunger eating behaviors. We will refill Wellbutrin SR for 1 month. Orders and follow up as documented in patient record.   - buPROPion (WELLBUTRIN SR) 150 MG 12 hr tablet; Take 1 tablet (150 mg total) by mouth daily.  Dispense: 60 tablet; Refill: 0  3. Class 3 severe obesity with serious comorbidity  and body mass index (BMI) of 50.0 to 59.9 in adult, unspecified obesity type [name] is currently in the action stage of change. As such, her goal is to continue with weight loss efforts. She has agreed to keeping a food journal and adhering to recommended goals of 1400-1600 calories and 90+ grams of protein daily.   Behavioral modification strategies: increasing lean protein intake, decreasing simple carbohydrates, planning for success and keeping a strict food journal.  [name] has agreed to follow-up with our clinic in 2 to 3 weeks. She was informed of the importance of frequent follow-up visits to maximize her success with intensive lifestyle modifications for her multiple health conditions.   Objective:   Blood pressure 109/76, pulse 85, temperature 97.9 F (36.6 C), temperature source Oral, height 5\' 1"  (1.549 m), weight 288 lb (130.6 kg), SpO2 95 %. Body mass index is 54.42 kg/m.  General: Cooperative, alert, well developed, in no acute distress. HEENT: Conjunctivae and lids unremarkable. Cardiovascular: Regular rhythm.  Lungs: Normal work of breathing. Neurologic: No focal deficits.   Lab Results  Component Value Date   CREATININE 0.71 12/07/2019   BUN 15 12/07/2019   NA 138 12/07/2019   K 3.9 12/07/2019   CL 101 12/07/2019   CO2 23 12/07/2019   Lab Results  Component Value Date   ALT 36 (H) 12/07/2019   AST 30 12/07/2019   ALKPHOS 65 12/07/2019   BILITOT 0.3 12/07/2019   Lab Results  Component Value Date   HGBA1C 5.7 (H) 12/07/2019  HGBA1C 5.8 (H) 08/07/2019   HGBA1C 5.6 08/29/2018   HGBA1C 5.8 (H) 04/27/2018   HGBA1C 5.8 (H) 12/07/2017   Lab Results  Component Value Date   INSULIN 24.7 12/07/2019   INSULIN 26.1 (H) 08/07/2019   INSULIN 29.5 (H) 08/29/2018   INSULIN 46.1 (H) 04/27/2018   INSULIN 20.1 12/07/2017   Lab Results  Component Value Date   TSH 1.670 12/07/2019   Lab Results  Component Value Date   CHOL 150 12/07/2019   HDL 45 12/07/2019     LDLCALC 80 12/07/2019   TRIG 143 12/07/2019   CHOLHDL 2.9 10/08/2017   Lab Results  Component Value Date   WBC 5.6 12/07/2019   HGB 14.0 12/07/2019   HCT 41.6 12/07/2019   MCV 86 12/07/2019   PLT 244 12/07/2019   No results found for: IRON, TIBC, FERRITIN  Attestation Statements:   Reviewed by clinician on day of visit: allergies, medications, problem list, medical history, surgical history, family history, social history, and previous encounter notes.   I, Trixie Dredge, am acting as transcriptionist for Dennard Nip, MD.  I have reviewed the above documentation for accuracy and completeness, and I agree with the above. -  Dennard Nip, MD

## 2020-05-08 DIAGNOSIS — Z419 Encounter for procedure for purposes other than remedying health state, unspecified: Secondary | ICD-10-CM | POA: Diagnosis not present

## 2020-05-15 ENCOUNTER — Other Ambulatory Visit: Payer: Self-pay

## 2020-05-15 ENCOUNTER — Encounter (INDEPENDENT_AMBULATORY_CARE_PROVIDER_SITE_OTHER): Payer: Self-pay | Admitting: Family Medicine

## 2020-05-15 ENCOUNTER — Ambulatory Visit (INDEPENDENT_AMBULATORY_CARE_PROVIDER_SITE_OTHER): Payer: Medicaid Other | Admitting: Family Medicine

## 2020-05-15 VITALS — BP 135/91 | HR 89 | Temp 98.0°F | Ht 61.0 in | Wt 291.0 lb

## 2020-05-15 DIAGNOSIS — Z6841 Body Mass Index (BMI) 40.0 and over, adult: Secondary | ICD-10-CM | POA: Diagnosis not present

## 2020-05-15 DIAGNOSIS — K5909 Other constipation: Secondary | ICD-10-CM

## 2020-05-16 DIAGNOSIS — F331 Major depressive disorder, recurrent, moderate: Secondary | ICD-10-CM | POA: Diagnosis not present

## 2020-05-16 DIAGNOSIS — F411 Generalized anxiety disorder: Secondary | ICD-10-CM | POA: Diagnosis not present

## 2020-05-16 NOTE — Progress Notes (Signed)
Chief Complaint:   OBESITY [name] is here to discuss her progress with her obesity treatment plan along with follow-up of her obesity related diagnoses. [name] is on keeping a food journal and adhering to recommended goals of 1400-1600 calories and 90+ grams of protein daily and states she is following her eating plan approximately 25% of the time. [name] states she is doing 0 minutes 0 times per week.  Today's visit was #: 46 Starting weight: 292 lbs Starting date: 12/07/2017 Today's weight: 291 lbs Today's date: 05/15/2020 Total lbs lost to date: 1 Total lbs lost since last in-office visit: 0  Interim History: [name] has struggled with staying on track. She sometimes misses meals and not always getting in all of her protein.  Subjective:   1. Other constipation [name] notes decreased BM frequency, and increased gas and GI rumbling.  Assessment/Plan:   1. Other constipation [name] was informed that a decrease in bowel movement frequency is normal while losing weight, but stools should not be hard or painful. [name] is ok to take OTC miralax and increase water intake. Orders and follow up as documented in patient record.   2. Class 3 severe obesity with serious comorbidity and body mass index (BMI) of 50.0 to 59.9 in adult, unspecified obesity type [facility]) [name] is currently in the action stage of change. As such, her goal is to continue with weight loss efforts. She has agreed to keeping a food journal and adhering to recommended goals of 1400-1600 calories and 90+ grams of protein daily.   Behavioral modification strategies: increasing lean protein intake and increasing water intake.  [name] has agreed to follow-up with our clinic in 3 to 4 weeks. She was informed of the importance of frequent follow-up visits to maximize her success with intensive lifestyle modifications for her multiple health conditions.   Objective:   Blood pressure (!) 135/91, pulse 89, temperature 98 F (36.7 C),  height 5\' 1"  (1.549 m), weight 291 lb (132 kg), SpO2 96 %. Body mass index is 54.98 kg/m.  General: Cooperative, alert, well developed, in no acute distress. HEENT: Conjunctivae and lids unremarkable. Cardiovascular: Regular rhythm.  Lungs: Normal work of breathing. Neurologic: No focal deficits.   Lab Results  Component Value Date   CREATININE 0.71 12/07/2019   BUN 15 12/07/2019   NA 138 12/07/2019   K 3.9 12/07/2019   CL 101 12/07/2019   CO2 23 12/07/2019   Lab Results  Component Value Date   ALT 36 (H) 12/07/2019   AST 30 12/07/2019   ALKPHOS 65 12/07/2019   BILITOT 0.3 12/07/2019   Lab Results  Component Value Date   HGBA1C 5.7 (H) 12/07/2019   HGBA1C 5.8 (H) 08/07/2019   HGBA1C 5.6 08/29/2018   HGBA1C 5.8 (H) 04/27/2018   HGBA1C 5.8 (H) 12/07/2017   Lab Results  Component Value Date   INSULIN 24.7 12/07/2019   INSULIN 26.1 (H) 08/07/2019   INSULIN 29.5 (H) 08/29/2018   INSULIN 46.1 (H) 04/27/2018   INSULIN 20.1 12/07/2017   Lab Results  Component Value Date   TSH 1.670 12/07/2019   Lab Results  Component Value Date   CHOL 150 12/07/2019   HDL 45 12/07/2019   LDLCALC 80 12/07/2019   TRIG 143 12/07/2019   CHOLHDL 2.9 10/08/2017   Lab Results  Component Value Date   WBC 5.6 12/07/2019   HGB 14.0 12/07/2019   HCT 41.6 12/07/2019   MCV 86 12/07/2019   PLT 244 12/07/2019  No results found for: IRON, TIBC, FERRITIN  Attestation Statements:   Reviewed by clinician on day of visit: allergies, medications, problem list, medical history, surgical history, family history, social history, and previous encounter notes.  Time spent on visit including pre-visit chart review and post-visit care and charting was 30 minutes.    I, Trixie Dredge, am acting as transcriptionist for Dennard Nip, MD.  I have reviewed the above documentation for accuracy and completeness, and I agree with the above. -  Dennard Nip, MD

## 2020-05-27 ENCOUNTER — Encounter: Payer: Self-pay | Admitting: Internal Medicine

## 2020-05-28 ENCOUNTER — Other Ambulatory Visit: Payer: Self-pay

## 2020-05-28 DIAGNOSIS — J4521 Mild intermittent asthma with (acute) exacerbation: Secondary | ICD-10-CM

## 2020-05-28 MED ORDER — ALBUTEROL SULFATE HFA 108 (90 BASE) MCG/ACT IN AERS: 2.0000 | INHALATION_SPRAY | Freq: Four times a day (QID) | RESPIRATORY_TRACT | 3 refills | Status: DC | PRN

## 2020-06-05 ENCOUNTER — Ambulatory Visit (INDEPENDENT_AMBULATORY_CARE_PROVIDER_SITE_OTHER): Payer: Medicaid Other | Admitting: Family Medicine

## 2020-06-07 DIAGNOSIS — Z419 Encounter for procedure for purposes other than remedying health state, unspecified: Secondary | ICD-10-CM | POA: Diagnosis not present

## 2020-06-24 ENCOUNTER — Encounter (INDEPENDENT_AMBULATORY_CARE_PROVIDER_SITE_OTHER): Payer: Self-pay | Admitting: Family Medicine

## 2020-06-24 ENCOUNTER — Other Ambulatory Visit: Payer: Self-pay

## 2020-06-24 ENCOUNTER — Ambulatory Visit (INDEPENDENT_AMBULATORY_CARE_PROVIDER_SITE_OTHER): Payer: Medicaid Other | Admitting: Family Medicine

## 2020-06-24 VITALS — BP 115/78 | HR 83 | Temp 98.7°F | Ht 61.0 in | Wt 292.0 lb

## 2020-06-24 DIAGNOSIS — R7303 Prediabetes: Secondary | ICD-10-CM

## 2020-06-24 DIAGNOSIS — Z6841 Body Mass Index (BMI) 40.0 and over, adult: Secondary | ICD-10-CM | POA: Diagnosis not present

## 2020-06-24 DIAGNOSIS — F3289 Other specified depressive episodes: Secondary | ICD-10-CM | POA: Diagnosis not present

## 2020-06-24 MED ORDER — METFORMIN HCL 500 MG PO TABS: ORAL_TABLET | ORAL | 0 refills | Status: DC

## 2020-06-29 ENCOUNTER — Encounter (INDEPENDENT_AMBULATORY_CARE_PROVIDER_SITE_OTHER): Payer: Self-pay | Admitting: Family Medicine

## 2020-07-02 NOTE — Progress Notes (Signed)
Chief Complaint:   OBESITY [name] is here to discuss her progress with her obesity treatment plan along with follow-up of her obesity related diagnoses. [name] is on keeping a food journal and adhering to recommended goals of 1400-1600 calories and 90+ grams of protein daily and states she is following her eating plan approximately 30% of the time. [name] states she is doing 0 minutes 0 times per week.  Today's visit was #: 23 Starting weight: 292 lbs Starting date: 12/07/2017 Today's weight: 292 lbs Today's date: 06/24/2020 Total lbs lost to date: 0 Total lbs lost since last in-office visit: 0  Interim History: [name] continues to struggle with weight loss. She is frustrated with consistently looking into weight loss surgery, but she needs to find a place that accepts her insurance. She only eats 1 time per day, but struggles to journal and is likely decreasing her RMR due to inadequate calories and protein.  Subjective:   1. Pre-diabetes [name] is tolerating metformin now, and she denies nausea or vomiting and notes decreased polyphagia. She denies hypoglycemia.  2. Other depression [name] is now being seen at [facility] and has had her medications adjusted. She is awaiting a call to start counseling but her mood is still low an her motivation to follow a structured eating plan isn't there.  Assessment/Plan:   1. Pre-diabetes [name] will continue to work on weight loss, exercise, and decreasing simple carbohydrates to help decrease the risk of diabetes. We will refill metformin for 1 month, and will follow closely.  - metFORMIN (GLUCOPHAGE) 500 MG tablet; TAKE 1 TABLET BY MOUTH 2 TIMES DAILY WITH A MEAL  Dispense: 60 tablet; Refill: 0  2. Other depression Behavior modification techniques were discussed today to help [name] deal with her emotional/non-hunger eating behaviors. We will continue to monitor, and [name] was encouraged to continue to look into counseling. Orders and follow up as  documented in patient record.   3. Class 3 severe obesity with serious comorbidity and body mass index (BMI) of 50.0 to 59.9 in adult, unspecified obesity type [facility]) [name] is currently in the action stage of change. As such, her goal is to continue with weight loss efforts. She has agreed to the Category 3 Plan or keeping a food journal and adhering to recommended goals of 1400-1600 calories and 100+ grams of protein daily.   [name] is to look into [facility] or [facility] for weight loss surgeons who accepts her insurance.  Behavioral modification strategies: increasing lean protein intake and meal planning and cooking strategies.  [name] has agreed to follow-up with our clinic in 4 weeks. She was informed of the importance of frequent follow-up visits to maximize her success with intensive lifestyle modifications for her multiple health conditions.   Objective:   Blood pressure 115/78, pulse 83, temperature 98.7 F (37.1 C), height 5\' 1"  (1.549 m), weight 292 lb (132.5 kg), SpO2 95 %. Body mass index is 55.17 kg/m.  General: Cooperative, alert, well developed, in no acute distress. HEENT: Conjunctivae and lids unremarkable. Cardiovascular: Regular rhythm.  Lungs: Normal work of breathing. Neurologic: No focal deficits.   Lab Results  Component Value Date   CREATININE 0.71 12/07/2019   BUN 15 12/07/2019   NA 138 12/07/2019   K 3.9 12/07/2019   CL 101 12/07/2019   CO2 23 12/07/2019   Lab Results  Component Value Date   ALT 36 (H) 12/07/2019   AST 30 12/07/2019   ALKPHOS 65 12/07/2019   BILITOT 0.3 12/07/2019  Lab Results  Component Value Date   HGBA1C 5.7 (H) 12/07/2019   HGBA1C 5.8 (H) 08/07/2019   HGBA1C 5.6 08/29/2018   HGBA1C 5.8 (H) 04/27/2018   HGBA1C 5.8 (H) 12/07/2017   Lab Results  Component Value Date   INSULIN 24.7 12/07/2019   INSULIN 26.1 (H) 08/07/2019   INSULIN 29.5 (H) 08/29/2018   INSULIN 46.1 (H) 04/27/2018   INSULIN 20.1 12/07/2017   Lab Results    Component Value Date   TSH 1.670 12/07/2019   Lab Results  Component Value Date   CHOL 150 12/07/2019   HDL 45 12/07/2019   LDLCALC 80 12/07/2019   TRIG 143 12/07/2019   CHOLHDL 2.9 10/08/2017   Lab Results  Component Value Date   WBC 5.6 12/07/2019   HGB 14.0 12/07/2019   HCT 41.6 12/07/2019   MCV 86 12/07/2019   PLT 244 12/07/2019   No results found for: IRON, TIBC, FERRITIN  Attestation Statements:   Reviewed by clinician on day of visit: allergies, medications, problem list, medical history, surgical history, family history, social history, and previous encounter notes.   I, Trixie Dredge, am acting as transcriptionist for Dennard Nip, MD.  I have reviewed the above documentation for accuracy and completeness, and I agree with the above. -

## 2020-07-04 ENCOUNTER — Encounter (INDEPENDENT_AMBULATORY_CARE_PROVIDER_SITE_OTHER): Payer: Self-pay | Admitting: Family Medicine

## 2020-07-08 ENCOUNTER — Telehealth (INDEPENDENT_AMBULATORY_CARE_PROVIDER_SITE_OTHER): Payer: Self-pay

## 2020-07-08 ENCOUNTER — Other Ambulatory Visit (INDEPENDENT_AMBULATORY_CARE_PROVIDER_SITE_OTHER): Payer: Self-pay

## 2020-07-08 DIAGNOSIS — F411 Generalized anxiety disorder: Secondary | ICD-10-CM | POA: Diagnosis not present

## 2020-07-08 DIAGNOSIS — Z6841 Body Mass Index (BMI) 40.0 and over, adult: Secondary | ICD-10-CM

## 2020-07-08 DIAGNOSIS — F331 Major depressive disorder, recurrent, moderate: Secondary | ICD-10-CM | POA: Diagnosis not present

## 2020-07-08 DIAGNOSIS — Z419 Encounter for procedure for purposes other than remedying health state, unspecified: Secondary | ICD-10-CM | POA: Diagnosis not present

## 2020-07-08 NOTE — Telephone Encounter (Signed)
Yes please

## 2020-07-08 NOTE — Telephone Encounter (Signed)
FAXED [place]: [facility] (Faxed on 07/08/20) Document: New Patient Referral request form along with last chart note.   All above requested information has been faxed successfully to [employer] listed above.

## 2020-07-22 ENCOUNTER — Other Ambulatory Visit: Payer: Self-pay

## 2020-07-22 ENCOUNTER — Encounter (INDEPENDENT_AMBULATORY_CARE_PROVIDER_SITE_OTHER): Payer: Self-pay | Admitting: Family Medicine

## 2020-07-22 ENCOUNTER — Ambulatory Visit (INDEPENDENT_AMBULATORY_CARE_PROVIDER_SITE_OTHER): Payer: Medicaid Other | Admitting: Family Medicine

## 2020-07-22 VITALS — BP 123/83 | HR 91 | Temp 97.9°F | Ht 61.0 in | Wt 290.0 lb

## 2020-07-22 DIAGNOSIS — R7303 Prediabetes: Secondary | ICD-10-CM | POA: Diagnosis not present

## 2020-07-22 DIAGNOSIS — F418 Other specified anxiety disorders: Secondary | ICD-10-CM | POA: Diagnosis not present

## 2020-07-22 DIAGNOSIS — E559 Vitamin D deficiency, unspecified: Secondary | ICD-10-CM | POA: Diagnosis not present

## 2020-07-22 DIAGNOSIS — Z6841 Body Mass Index (BMI) 40.0 and over, adult: Secondary | ICD-10-CM | POA: Diagnosis not present

## 2020-07-22 DIAGNOSIS — R7989 Other specified abnormal findings of blood chemistry: Secondary | ICD-10-CM | POA: Diagnosis not present

## 2020-07-22 MED ORDER — BUPROPION HCL ER (SR) 150 MG PO TB12: 150.0000 mg | ORAL_TABLET | Freq: Every day | ORAL | 0 refills | Status: DC

## 2020-07-22 MED ORDER — METFORMIN HCL 500 MG PO TABS: ORAL_TABLET | ORAL | 0 refills | Status: DC

## 2020-07-23 LAB — COMPREHENSIVE METABOLIC PANEL
ALT: 51 IU/L — ABNORMAL HIGH (ref 0–32)
AST: 38 IU/L (ref 0–40)
Albumin/Globulin Ratio: 1.4 (ref 1.2–2.2)
Albumin: 4.5 g/dL (ref 3.8–4.8)
Alkaline Phosphatase: 80 IU/L (ref 44–121)
BUN/Creatinine Ratio: 16 (ref 9–23)
BUN: 11 mg/dL (ref 6–24)
Bilirubin Total: 0.4 mg/dL (ref 0.0–1.2)
CO2: 23 mmol/L (ref 20–29)
Calcium: 9.6 mg/dL (ref 8.7–10.2)
Chloride: 103 mmol/L (ref 96–106)
Creatinine, Ser: 0.7 mg/dL (ref 0.57–1.00)
GFR calc Af Amer: 121 mL/min/{1.73_m2} (ref >59)
GFR calc non Af Amer: 105 mL/min/{1.73_m2} (ref >59)
Globulin, Total: 3.2 g/dL (ref 1.5–4.5)
Glucose: 112 mg/dL — ABNORMAL HIGH (ref 65–99)
Potassium: 4.2 mmol/L (ref 3.5–5.2)
Sodium: 141 mmol/L (ref 134–144)
Total Protein: 7.7 g/dL (ref 6.0–8.5)

## 2020-07-23 LAB — VITAMIN D 25 HYDROXY (VIT D DEFICIENCY, FRACTURES): Vit D, 25-Hydroxy: 50.4 ng/mL (ref 30.0–100.0)

## 2020-07-23 LAB — HEMOGLOBIN A1C
Est. average glucose Bld gHb Est-mCnc: 117 mg/dL
Hgb A1c MFr Bld: 5.7 % — ABNORMAL HIGH (ref 4.8–5.6)

## 2020-07-23 LAB — INSULIN, RANDOM: INSULIN: 33.2 u[IU]/mL — ABNORMAL HIGH (ref 2.6–24.9)

## 2020-07-23 NOTE — Progress Notes (Signed)
Chief Complaint:   OBESITY [name] is here to discuss her progress with her obesity treatment plan along with follow-up of her obesity related diagnoses. [name] is on the Category 3 Plan or keeping a food journal and adhering to recommended goals of 1400-1600 calories and 100+ grams of protein daily and states she is following her eating plan approximately 30% of the time. Retina states she is doing 0 minutes 0 times per week.  Today's visit was #: 76 Starting weight: 292 lbs Starting date: 12/07/2017 Today's weight: 290 lbs Today's date: 07/22/2020 Total lbs lost to date: 2 Total lbs lost since last in-office visit: 2  Interim History: [name] continues to do well with weight loss, but she is dealing with a lot of family stress. She notes her sleep isn't good and she struggles to eat all of her food.  Subjective:   1. Pre-diabetes [name] is working on weight loss and is at high risk of developing diabetes mellitus. She is due for labs.  2. Depression with anxiety [name]'s mood is stable on her medications. She is still dealing with stress however, which may not be improving soon.  3. Elevated LFTs [name] is working on weight loss to help decrease her LFTs, and she is due for labs today.  4. Vitamin D deficiency [name] is stable on Vit D, and she denies nausea or vomiting. She is due for labs.  Assessment/Plan:   1. Pre-diabetes [name] will continue to work on weight loss, exercise, and decreasing simple carbohydrates to help decrease the risk of diabetes. We will check labs today, and we will refill metformin for 1 month.  - Hemoglobin A1c - Insulin, random - metFORMIN (GLUCOPHAGE) 500 MG tablet; TAKE 1 TABLET BY MOUTH 2 TIMES DAILY WITH A MEAL  Dispense: 60 tablet; Refill: 0  2. Depression with anxiety We discussed stress reduction techniques. We will refill Wellbutrin SR for 1 month. Orders and follow up as documented in patient record.   - buPROPion (WELLBUTRIN SR) 150 MG 12  hr tablet; Take 1 tablet (150 mg total) by mouth daily.  Dispense: 30 tablet; Refill: 0  3. Elevated LFTs We discussed the likely diagnosis of non-alcoholic fatty liver disease today and how this condition is obesity related. [name] was educated the importance of weight loss. We will check labs today. [name] agreed to continue with her weight loss efforts with healthier diet and exercise as an essential part of her treatment plan.  - Comprehensive metabolic panel  4. Vitamin D deficiency Low Vitamin D level contributes to fatigue and are associated with obesity, breast, and colon cancer. We will check labs today. [name] will follow-up for routine testing of Vitamin D, at least 2-3 times per year to avoid over-replacement.  - VITAMIN D 25 Hydroxy (Vit-D Deficiency, Fractures)  5. Class 3 severe obesity with serious comorbidity and body mass index (BMI) of 50.0 to 59.9 in adult, unspecified obesity type [facility]) [name] is currently in the action stage of change. As such, her goal is to continue with weight loss efforts. She has agreed to the Category 3 Plan or keeping a food journal and adhering to recommended goals of 1400-1600 calories and 100+ grams of protein daily.   Behavioral modification strategies: holiday eating strategies .  [name] has agreed to follow-up with our clinic in 4 weeks. She was informed of the importance of frequent follow-up visits to maximize her success with intensive lifestyle modifications for her multiple health conditions.   [name] was  informed we would discuss her lab results at her next visit unless there is a critical issue that needs to be addressed sooner. [name] agreed to keep her next visit at the agreed upon time to discuss these results.  Objective:   Blood pressure 123/83, pulse 91, temperature 97.9 F (36.6 C), height 5\' 1"  (1.549 m), weight 290 lb (131.5 kg), SpO2 97 %. Body mass index is 54.8 kg/m.  General: Cooperative, alert, well developed, in no acute  distress. HEENT: Conjunctivae and lids unremarkable. Cardiovascular: Regular rhythm.  Lungs: Normal work of breathing. Neurologic: No focal deficits.   Lab Results  Component Value Date   CREATININE 0.70 07/22/2020   BUN 11 07/22/2020   NA 141 07/22/2020   K 4.2 07/22/2020   CL 103 07/22/2020   CO2 23 07/22/2020   Lab Results  Component Value Date   ALT 51 (H) 07/22/2020   AST 38 07/22/2020   ALKPHOS 80 07/22/2020   BILITOT 0.4 07/22/2020   Lab Results  Component Value Date   HGBA1C 5.7 (H) 07/22/2020   HGBA1C 5.7 (H) 12/07/2019   HGBA1C 5.8 (H) 08/07/2019   HGBA1C 5.6 08/29/2018   HGBA1C 5.8 (H) 04/27/2018   Lab Results  Component Value Date   INSULIN 33.2 (H) 07/22/2020   INSULIN 24.7 12/07/2019   INSULIN 26.1 (H) 08/07/2019   INSULIN 29.5 (H) 08/29/2018   INSULIN 46.1 (H) 04/27/2018   Lab Results  Component Value Date   TSH 1.670 12/07/2019   Lab Results  Component Value Date   CHOL 150 12/07/2019   HDL 45 12/07/2019   LDLCALC 80 12/07/2019   TRIG 143 12/07/2019   CHOLHDL 2.9 10/08/2017   Lab Results  Component Value Date   WBC 5.6 12/07/2019   HGB 14.0 12/07/2019   HCT 41.6 12/07/2019   MCV 86 12/07/2019   PLT 244 12/07/2019   No results found for: IRON, TIBC, FERRITIN  Attestation Statements:   Reviewed by clinician on day of visit: allergies, medications, problem list, medical history, surgical history, family history, social history, and previous encounter notes.   I, Trixie Dredge, am acting as transcriptionist for Dennard Nip, MD.  I have reviewed the above documentation for accuracy and completeness, and I agree with the above. -  Dennard Nip, MD

## 2020-07-24 NOTE — Telephone Encounter (Signed)
Contacted [facility] at [phone] and spoke with representative named Nira Conn regarding this pt's referral for bariatric surgery.  Nira Conn stated that the referral was in fact, received and it is being worked on at this time, but patient is not currently scheduled for an appointment as of this time.

## 2020-08-07 DIAGNOSIS — Z419 Encounter for procedure for purposes other than remedying health state, unspecified: Secondary | ICD-10-CM | POA: Diagnosis not present

## 2020-08-08 DIAGNOSIS — Z9884 Bariatric surgery status: Secondary | ICD-10-CM | POA: Diagnosis not present

## 2020-08-19 ENCOUNTER — Ambulatory Visit (INDEPENDENT_AMBULATORY_CARE_PROVIDER_SITE_OTHER): Payer: Medicaid Other | Admitting: Family Medicine

## 2020-08-19 ENCOUNTER — Other Ambulatory Visit: Payer: Self-pay

## 2020-08-19 ENCOUNTER — Encounter (INDEPENDENT_AMBULATORY_CARE_PROVIDER_SITE_OTHER): Payer: Self-pay | Admitting: Family Medicine

## 2020-08-19 VITALS — BP 107/74 | HR 86 | Temp 97.8°F | Ht 61.0 in | Wt 291.0 lb

## 2020-08-19 DIAGNOSIS — R7303 Prediabetes: Secondary | ICD-10-CM | POA: Diagnosis not present

## 2020-08-19 DIAGNOSIS — Z6841 Body Mass Index (BMI) 40.0 and over, adult: Secondary | ICD-10-CM

## 2020-08-19 DIAGNOSIS — F3289 Other specified depressive episodes: Secondary | ICD-10-CM | POA: Diagnosis not present

## 2020-08-19 MED ORDER — METFORMIN HCL 500 MG PO TABS: ORAL_TABLET | ORAL | 0 refills | Status: DC

## 2020-08-19 NOTE — Progress Notes (Signed)
Chief Complaint:   OBESITY [name] is here to discuss her progress with her obesity treatment plan along with follow-up of her obesity related diagnoses. [name] is on the Category 3 Plan or keeping a food journal and adhering to recommended goals of 1400-1600 calories and 100+ grams of protein daily and states she is following her eating plan approximately 30% of the time. [name] states she is doing 0 minutes 0 times per week.  Today's visit was #: 57 Starting weight: 292 lbs Starting date: 12/07/2017 Today's weight: 291 lbs Today's date: 08/19/2020 Total lbs lost to date: 1 Total lbs lost since last in-office visit: 0  Interim History: [name] is in the process of having weight loss surgery and she is doing nutrition classes to prepare her for surgery. She may have to wait another 3-4 months to get everything done. She is trying to portion control and make smarter choices this month, getting ready for Christmas.  Subjective:   1. Pre-diabetes [name] is stable on metformin, and will plan on being on this up until when she has weight loss surgery.  2. Other depression with emotional eating [name]'s mood is stable, and she wonders if she still needs Wellbutrin as she feels it isn't helping. She denies suicidal or homicidal ideas.  Assessment/Plan:   1. Pre-diabetes [name] will continue to work on weight loss, exercise, and decreasing simple carbohydrates to help decrease the risk of diabetes. We will refill metformin for 90 days with no refills.  - metFORMIN (GLUCOPHAGE) 500 MG tablet; TAKE 1 TABLET BY MOUTH 2 TIMES DAILY WITH A MEAL  Dispense: 180 tablet; Refill: 0  2. Other depression with emotional eating Behavior modification techniques were discussed today to help [name] deal with her emotional/non-hunger eating behaviors. [name] agreed to discontinue Wellbutrin and she will continue Cymbalta. Orders and follow up as documented in patient record.   3. Class 3 severe obesity with  serious comorbidity and body mass index (BMI) of 50.0 to 59.9 in adult, unspecified obesity type [name]) [name] is currently in the action stage of change. As such, her goal is to continue with weight loss efforts. She has agreed to practicing portion control and making smarter food choices, such as increasing vegetables and decreasing simple carbohydrates.   We discussed more strategies after weight loss surgery, and how this will be different than what she is doing now before weight loss surgery.  Behavioral modification strategies: increasing lean protein intake, increasing water intake and holiday eating strategies .  Nayelli has agreed to follow-up with our clinic in 3 months. She was informed of the importance of frequent follow-up visits to maximize her success with intensive lifestyle modifications for her multiple health conditions.   Objective:   Blood pressure 107/74, pulse 86, temperature 97.8 F (36.6 C), height 5\' 1"  (1.549 m), weight 291 lb (132 kg), SpO2 95 %. Body mass index is 54.98 kg/m.  General: Cooperative, alert, well developed, in no acute distress. HEENT: Conjunctivae and lids unremarkable. Cardiovascular: Regular rhythm.  Lungs: Normal work of breathing. Neurologic: No focal deficits.   Lab Results  Component Value Date   CREATININE 0.70 07/22/2020   BUN 11 07/22/2020   NA 141 07/22/2020   K 4.2 07/22/2020   CL 103 07/22/2020   CO2 23 07/22/2020   Lab Results  Component Value Date   ALT 51 (H) 07/22/2020   AST 38 07/22/2020   ALKPHOS 80 07/22/2020   BILITOT 0.4 07/22/2020   Lab Results  Component  Value Date   HGBA1C 5.7 (H) 07/22/2020   HGBA1C 5.7 (H) 12/07/2019   HGBA1C 5.8 (H) 08/07/2019   HGBA1C 5.6 08/29/2018   HGBA1C 5.8 (H) 04/27/2018   Lab Results  Component Value Date   INSULIN 33.2 (H) 07/22/2020   INSULIN 24.7 12/07/2019   INSULIN 26.1 (H) 08/07/2019   INSULIN 29.5 (H) 08/29/2018   INSULIN 46.1 (H) 04/27/2018   Lab Results   Component Value Date   TSH 1.670 12/07/2019   Lab Results  Component Value Date   CHOL 150 12/07/2019   HDL 45 12/07/2019   LDLCALC 80 12/07/2019   TRIG 143 12/07/2019   CHOLHDL 2.9 10/08/2017   Lab Results  Component Value Date   WBC 5.6 12/07/2019   HGB 14.0 12/07/2019   HCT 41.6 12/07/2019   MCV 86 12/07/2019   PLT 244 12/07/2019   No results found for: IRON, TIBC, FERRITIN  Attestation Statements:   Reviewed by clinician on day of visit: allergies, medications, problem list, medical history, surgical history, family history, social history, and previous encounter notes.   I, Trixie Dredge, am acting as transcriptionist for Dennard Nip, MD.  I have reviewed the above documentation for accuracy and completeness, and I agree with the above. -  Dennard Nip, MD

## 2020-09-07 DIAGNOSIS — Z419 Encounter for procedure for purposes other than remedying health state, unspecified: Secondary | ICD-10-CM | POA: Diagnosis not present

## 2020-09-11 DIAGNOSIS — Z20828 Contact with and (suspected) exposure to other viral communicable diseases: Secondary | ICD-10-CM | POA: Diagnosis not present

## 2020-09-11 DIAGNOSIS — U071 COVID-19: Secondary | ICD-10-CM | POA: Diagnosis not present

## 2020-09-12 DIAGNOSIS — Z20828 Contact with and (suspected) exposure to other viral communicable diseases: Secondary | ICD-10-CM | POA: Diagnosis not present

## 2020-09-17 DIAGNOSIS — Z6841 Body Mass Index (BMI) 40.0 and over, adult: Secondary | ICD-10-CM | POA: Diagnosis not present

## 2020-09-20 ENCOUNTER — Telehealth: Payer: Self-pay | Admitting: Family Medicine

## 2020-09-20 NOTE — Telephone Encounter (Signed)
Attempted to reach [name] today to schedule her a phone visit with the High Risk Managed Medicaid RN Case Manager. I left my name and number for her to return my call. If I don't hear back from her I will reach out again in 7-14 days.

## 2020-09-25 DIAGNOSIS — F331 Major depressive disorder, recurrent, moderate: Secondary | ICD-10-CM | POA: Diagnosis not present

## 2020-10-08 DIAGNOSIS — Z419 Encounter for procedure for purposes other than remedying health state, unspecified: Secondary | ICD-10-CM | POA: Diagnosis not present

## 2020-10-09 DIAGNOSIS — F331 Major depressive disorder, recurrent, moderate: Secondary | ICD-10-CM | POA: Diagnosis not present

## 2020-10-10 ENCOUNTER — Telehealth: Payer: Self-pay | Admitting: Family Medicine

## 2020-10-10 NOTE — Telephone Encounter (Signed)
I spoke with [name] today to see about getting her scheduled with the Managed Medicaid team for a phone visit. She declined these services for now.

## 2020-10-23 DIAGNOSIS — F331 Major depressive disorder, recurrent, moderate: Secondary | ICD-10-CM | POA: Diagnosis not present

## 2020-10-28 DIAGNOSIS — Z6841 Body Mass Index (BMI) 40.0 and over, adult: Secondary | ICD-10-CM | POA: Diagnosis not present

## 2020-11-05 DIAGNOSIS — Z419 Encounter for procedure for purposes other than remedying health state, unspecified: Secondary | ICD-10-CM | POA: Diagnosis not present

## 2020-11-06 DIAGNOSIS — F331 Major depressive disorder, recurrent, moderate: Secondary | ICD-10-CM | POA: Diagnosis not present

## 2020-11-11 ENCOUNTER — Ambulatory Visit (INDEPENDENT_AMBULATORY_CARE_PROVIDER_SITE_OTHER): Payer: Medicaid Other | Admitting: Family Medicine

## 2020-11-13 DIAGNOSIS — Z7189 Other specified counseling: Secondary | ICD-10-CM | POA: Diagnosis not present

## 2020-11-13 DIAGNOSIS — Z6841 Body Mass Index (BMI) 40.0 and over, adult: Secondary | ICD-10-CM | POA: Diagnosis not present

## 2020-11-13 DIAGNOSIS — E669 Obesity, unspecified: Secondary | ICD-10-CM | POA: Diagnosis not present

## 2020-11-13 DIAGNOSIS — K219 Gastro-esophageal reflux disease without esophagitis: Secondary | ICD-10-CM | POA: Diagnosis not present

## 2020-11-13 DIAGNOSIS — R7303 Prediabetes: Secondary | ICD-10-CM | POA: Diagnosis not present

## 2020-11-13 DIAGNOSIS — G4733 Obstructive sleep apnea (adult) (pediatric): Secondary | ICD-10-CM | POA: Diagnosis not present

## 2020-11-14 ENCOUNTER — Ambulatory Visit (INDEPENDENT_AMBULATORY_CARE_PROVIDER_SITE_OTHER): Payer: Medicaid Other | Admitting: Family Medicine

## 2020-11-20 ENCOUNTER — Ambulatory Visit (INDEPENDENT_AMBULATORY_CARE_PROVIDER_SITE_OTHER): Payer: Medicaid Other | Admitting: Family Medicine

## 2020-11-21 ENCOUNTER — Encounter (INDEPENDENT_AMBULATORY_CARE_PROVIDER_SITE_OTHER): Payer: Self-pay | Admitting: Family Medicine

## 2020-11-21 ENCOUNTER — Telehealth (INDEPENDENT_AMBULATORY_CARE_PROVIDER_SITE_OTHER): Payer: Medicaid Other | Admitting: Family Medicine

## 2020-11-21 ENCOUNTER — Other Ambulatory Visit: Payer: Self-pay

## 2020-11-21 DIAGNOSIS — Z6841 Body Mass Index (BMI) 40.0 and over, adult: Secondary | ICD-10-CM

## 2020-11-21 DIAGNOSIS — R7303 Prediabetes: Secondary | ICD-10-CM

## 2020-11-21 MED ORDER — METFORMIN HCL 500 MG PO TABS: ORAL_TABLET | ORAL | 0 refills | Status: DC

## 2020-11-27 NOTE — Progress Notes (Signed)
TeleHealth Visit:  Due to the COVID-19 pandemic, this visit was completed with telemedicine (audio/video) technology to reduce patient and provider exposure as well as to preserve personal protective equipment.   [name] has verbally consented to this TeleHealth visit. The patient is located at home, the provider is located at the [facility] office. The participants in this visit include the listed provider and patient. The visit was conducted today via MyChart video.   Chief Complaint: OBESITY [name] is here to discuss her progress with her obesity treatment plan along with follow-up of her obesity related diagnoses. [name] is on practicing portion control and making smarter food choices, such as increasing vegetables and decreasing simple carbohydrates and states she is following her eating plan approximately 100% of the time. [name] states she was walking for 15 minutes 1-2 times per week, but not in the last 3 weeks due to knee pain.  Today's visit was #: 23 Starting weight: 292 lbs Starting date: 12/07/2017  Interim History: [name] is in the process of having weight loss surgery at [facility]. She is decreasing soda and trying to increase her lean protein. She is relying on protein drinks to meet her goals, which she realizes is not as ideal as eating "real food".  Subjective:   1. Pre-diabetes [name] continues to work on decreasing simple carbohydrates. She has decreased liquid calories, and she is trying to decrease sugar in her diet. She denies hypoglycemia.  Assessment/Plan:   1. Pre-diabetes [name] will continue to work on weight loss, exercise, and decreasing simple carbohydrates to help decrease the risk of diabetes. We will refill metformin for 90 days with no refills.  - metFORMIN (GLUCOPHAGE) 500 MG tablet; TAKE 1 TABLET BY MOUTH 2 TIMES DAILY WITH A MEAL  Dispense: 180 tablet; Refill: 0  2. Obesity with current BMI 54.98 [name] is currently in the action stage of  change. As such, her goal is to continue with weight loss efforts. She has agreed to practicing portion control and making smarter food choices, such as increasing vegetables and decreasing simple carbohydrates.   Behavioral modification strategies: increasing lean protein intake and meal planning and cooking strategies.  [name] has agreed to follow-up with our clinic in 3 months. She was informed of the importance of frequent follow-up visits to maximize her success with intensive lifestyle modifications for her multiple health conditions.  Objective:   VITALS: Per patient if applicable, see vitals. GENERAL: Alert and in no acute distress. CARDIOPULMONARY: No increased WOB. Speaking in clear sentences.  PSYCH: Pleasant and cooperative. Speech normal rate and rhythm. Affect is appropriate. Insight and judgement are appropriate. Attention is focused, linear, and appropriate.  NEURO: Oriented as arrived to appointment on time with no prompting.   Lab Results  Component Value Date   CREATININE 0.70 07/22/2020   BUN 11 07/22/2020   NA 141 07/22/2020   K 4.2 07/22/2020   CL 103 07/22/2020   CO2 23 07/22/2020   Lab Results  Component Value Date   ALT 51 (H) 07/22/2020   AST 38 07/22/2020   ALKPHOS 80 07/22/2020   BILITOT 0.4 07/22/2020   Lab Results  Component Value Date   HGBA1C 5.7 (H) 07/22/2020   HGBA1C 5.7 (H) 12/07/2019   HGBA1C 5.8 (H) 08/07/2019   HGBA1C 5.6 08/29/2018   HGBA1C 5.8 (H) 04/27/2018   Lab Results  Component Value Date   INSULIN 33.2 (H) 07/22/2020   INSULIN 24.7 12/07/2019   INSULIN 26.1 (H) 08/07/2019  INSULIN 29.5 (H) 08/29/2018   INSULIN 46.1 (H) 04/27/2018   Lab Results  Component Value Date   TSH 1.670 12/07/2019   Lab Results  Component Value Date   CHOL 150 12/07/2019   HDL 45 12/07/2019   LDLCALC 80 12/07/2019   TRIG 143 12/07/2019   CHOLHDL 2.9 10/08/2017   Lab Results  Component Value Date   WBC 5.6 12/07/2019   HGB 14.0  12/07/2019   HCT 41.6 12/07/2019   MCV 86 12/07/2019   PLT 244 12/07/2019   No results found for: IRON, TIBC, FERRITIN  Attestation Statements:   Reviewed by clinician on day of visit: allergies, medications, problem list, medical history, surgical history, family history, social history, and previous encounter notes.   I, Trixie Dredge, am acting as transcriptionist for Dennard Nip, MD.  I have reviewed the above documentation for accuracy and completeness, and I agree with the above. - Dennard Nip, MD

## 2020-11-29 ENCOUNTER — Encounter: Payer: Self-pay | Admitting: Physician Assistant

## 2020-11-29 ENCOUNTER — Ambulatory Visit: Payer: Medicaid Other | Admitting: Physician Assistant

## 2020-11-29 ENCOUNTER — Other Ambulatory Visit: Payer: Self-pay

## 2020-11-29 VITALS — BP 132/80 | HR 98 | Temp 97.7°F | Resp 18 | Ht 61.0 in | Wt 304.0 lb

## 2020-11-29 DIAGNOSIS — M25562 Pain in left knee: Secondary | ICD-10-CM | POA: Diagnosis not present

## 2020-11-29 DIAGNOSIS — B372 Candidiasis of skin and nail: Secondary | ICD-10-CM | POA: Diagnosis not present

## 2020-11-29 MED ORDER — FLUCONAZOLE 150 MG PO TABS: ORAL_TABLET | ORAL | 0 refills | Status: DC

## 2020-11-29 NOTE — Progress Notes (Addendum)
Established patient visit   Patient: [name]   DOB: 1976/04/12   45 y.o. Female  MRN: [id number] Visit Date: 11/29/2020  Today's healthcare provider: Trinna Post, PA-C   Chief Complaint  Patient presents with  . Knee Pain    Left since aug at beach climbing sand dune   Subjective    HPI HPI    Knee Pain    Comments: Left since aug at beach climbing sand dune       Last edited by Cathrine Muster, CMA on 11/29/2020  1:45 PM. (History)      Patient presents today with left knee pain ongoing since August 2021. She was climbing up a sand dune and after which she felt pain. She felt irritation somewhat immediately that progressed to pain that night. She has intermittent pain since then which has been more persistent. Also notices some swelling. The pain is all around the left knee joint and she also experiences some tightness associated with this. Also notes some locking and catching. She has tried ice, tylenol, ibuprofen. These were minimally effective. She is getting evaluated for bariatric surgery and has had to stop NSAIDs. She is uncertain of a specific injury, but has had some episodes of rolling her ankles and falling but apart from that never broke this joint.   Patient reports red rash that has started under abdominal fold and then spread to thighs and vaginal region.      Medications: Outpatient Medications Prior to Visit  Medication Sig  . acetaminophen (TYLENOL) 325 MG tablet Take 650 mg by mouth every 6 (six) hours as needed.  [name] Kitchen albuterol (PROVENTIL) (2.5 MG/3ML) 0.083% nebulizer solution Take 3 mLs (2.5 mg total) by nebulization every 6 (six) hours as needed for wheezing or shortness of breath.  [name] Kitchen albuterol (VENTOLIN HFA) 108 (90 Base) MCG/ACT inhaler Inhale 2 puffs into the lungs every 6 (six) hours as needed for wheezing or shortness of breath.  Francia Greaves THYROID 120 MG tablet TAKE 1 TABLET BY MOUTH ONCE A DAY BEFOREBREAKFAST  . Azelastine-Fluticasone  137-50 MCG/ACT SUSP Place 1 spray into the nose daily.  . budesonide-formoterol (SYMBICORT) 80-4.5 MCG/ACT inhaler Inhale 2 puffs into the lungs 2 (two) times daily.  . clonazePAM (KLONOPIN) 0.5 MG tablet Take 0.5 mg by mouth 3 (three) times daily as needed for anxiety.  . cyclobenzaprine (FLEXERIL) 10 MG tablet Take 0.5-1 tablets (5-10 mg total) by mouth 3 (three) times daily as needed for muscle spasms. Take mainly at bedtime as may make drowsy  . diclofenac (VOLTAREN) 75 MG EC tablet TAKE 1 TABLET BY MOUTH ONCE DAILY AS NEEDED FOR MILD TO MODERATE PAIN  . DULoxetine (CYMBALTA) 30 MG capsule Take 30 mg by mouth daily.  [name] Kitchen estradiol (VIVELLE-DOT) 0.05 MG/24HR patch Place 1 patch onto the skin 2 (two) times a week.  . fexofenadine (ALLEGRA) 180 MG tablet Take 1 tablet (180 mg total) by mouth daily.  [name] Kitchen gabapentin (NEURONTIN) 100 MG capsule Take 100 mg by mouth 3 (three) times daily.  [name] Kitchen ibuprofen (ADVIL,MOTRIN) 200 MG tablet Take 200 mg by mouth every 6 (six) hours as needed.  . metFORMIN (GLUCOPHAGE) 500 MG tablet TAKE 1 TABLET BY MOUTH 2 TIMES DAILY WITH A MEAL  . montelukast (SINGULAIR) 10 MG tablet Take 1 tablet (10 mg total) by mouth at bedtime.  . pantoprazole (PROTONIX) 40 MG tablet Take 40 mg by mouth daily.  . progesterone (PROMETRIUM) 100 MG capsule Take 100 mg by  mouth daily.  . traMADol (ULTRAM) 50 MG tablet Take 50 mg by mouth every 6 (six) hours as needed.  [name] Kitchen VITAMIN D, CHOLECALCIFEROL, PO Take 50 mcg by mouth 2 (two) times daily.  [name] Kitchen zolpidem (AMBIEN) 5 MG tablet Take 10 mg by mouth at bedtime as needed.  . [DISCONTINUED] omeprazole (PRILOSEC) 20 MG capsule Take 20 mg by mouth daily.   No facility-administered medications prior to visit.    Review of Systems  All other systems reviewed and are negative.      Objective    BP 132/80   Pulse 98   Temp 97.7 F (36.5 C)   Resp 18   Ht 5\' 1"  (1.549 m)   Wt (!) 304 lb (137.9 kg)   SpO2 97%   BMI 57.44 kg/m     Physical  Exam Constitutional:      Appearance: Normal appearance.  Musculoskeletal:     Right knee: Normal.     Left knee: Swelling and crepitus present. Normal range of motion. Abnormal meniscus.     Instability Tests: Anterior drawer test negative. Posterior drawer test negative. Anterior Lachman test negative. Medial McMurray test positive. Lateral McMurray test negative.  Skin:    General: Skin is warm and dry.  Neurological:     Mental Status: She is alert. Mental status is at baseline.  Psychiatric:        Mood and Affect: Mood normal.        Behavior: Behavior normal.       No results found for any visits on 11/29/20.  Assessment & Plan    1. Acute pain of left knee  - Ambulatory referral to Orthopedics  2. Candidal skin infection  - fluconazole (DIFLUCAN) 150 MG tablet; Take one pill on day 1 and second pill three days later.  Dispense: 2 tablet; Refill: 0  Return if symptoms worsen or fail to improve.        Trinna Post, PA-C  [facility] [phone] (phone) [phone] (fax)  [place]

## 2020-11-29 NOTE — Patient Instructions (Signed)
Meniscus Tear  A meniscus tear is a knee injury that happens when a piece of the meniscus is torn. The meniscus is a thick, rubbery, wedge-shaped piece of cartilage in the knee. Each knee has two menisci sitting between the upper bone (femur) and lower bone (tibia) that form the knee joint. Each meniscus acts as a shock absorber for the knee. A torn meniscus is a common knee injury, ranging from mild to severe. Surgery may be needed to repair a severe tear. What are the causes? This condition may be caused by kneeling, squatting, twisting, or pivoting movements. Sports-related injuries are the most common cause, often resulting from:  Running and stopping suddenly.  Changing direction.  Being tackled or knocked off your feet.  Lifting or carrying heavy weights. As people get older, their menisci get thinner and weaker. Tears can happen more easily in older people, for example, when climbing stairs. What increases the risk? You are more likely to develop this condition if you:  Play contact sports.  Have a job that requires kneeling or squatting.  Are female.  Are over 40 years old. What are the signs or symptoms? Symptoms of this condition include:  Knee pain, especially at the side of the knee joint. You may feel pain immediately after injury, or hear a pop and feel pain later.  A feeling that your knee is clicking, catching, locking, or giving way (weakness, instability).  Not being able to fully bend or extend your knee.  Bruising or swelling in your knee. How is this diagnosed? This condition may be diagnosed based on your symptoms and a physical exam. You may also have tests, such as:  X-rays.  MRI.  Arthroscopy. This is a procedure to look inside your knee with a narrow surgical telescope. You may be referred to a knee specialist (orthopedic surgeon). How is this treated? Treatment for this injury depends on the severity of the tear. Treatment for a mild tear may  include:  Rest.  Medicine to reduce pain and swelling, usually a nonsteroidal anti-inflammatory drug (NSAID), like ibuprofen.  A knee brace, sleeve, or wrap.  Using crutches or a walker to keep weight off your knee and help with walking.  Exercises to strengthen your knee (physical therapy). You may need surgery if you have a severe tear or if other treatments fail. Follow these instructions at home: If you have a brace, sleeve, or wrap:  Wear it, as told by your health care provider. Remove it, only as told by your health care provider.  Loosen the brace, sleeve, or wrap if your toes tingle, become numb, or turn cold and blue.  Keep the brace, sleeve, or wrap clean.  If the brace, sleeve, or wrap is not waterproof: ? Do not let it get wet. ? Cover it with a watertight covering when you take a bath or shower. Managing pain, stiffness, and swelling  Take over-the-counter and prescription medicines only as told by your health care provider.  If directed, put ice on your knee. To do this: ? If you have a removable brace, sleeve, or wrap, remove it as told by your health care provider. ? Put ice in a plastic bag. ? Place a towel between your skin and the bag. ? Leave the ice on for 20 minutes, 2-3 times per day. ? Remove the ice if your skin turns bright red. This is very important. If you cannot feel pain, heat, or cold, you have a greater risk of damage to   the area.  Move your toes often to reduce stiffness and swelling.  Raise (elevate) the injured area above the level of your heart while you are sitting or lying down.   Activity  Do not use the injured limb to support your body weight until your health care provider says that you can. Use crutches or a walker as told by your health care provider.  Return to your normal activities as told by your health care provider. Ask your health care provider what activities are safe for you.  Perform range-of-motion exercises only as  told by your health care provider.  Begin doing exercises to strengthen your knee and leg muscles only as told by your health care provider. After you recover, your health care provider may recommend these exercises to help prevent another injury. General instructions  Use a knee brace, sleeve, or wrap as told by your health care provider.  Ask your health care provider when it is safe to drive if you have a brace, sleeve, or wrap on your knee.  Do not use any products that contain nicotine or tobacco, such as cigarettes, e-cigarettes, and chewing tobacco. If you need help quitting, ask your health care provider.  Ask your health care provider if the medicine prescribed to you: ? Requires you to avoid driving or using heavy machinery. ? Can cause constipation. You may need to take these actions to prevent or treat constipation:  Drink enough fluid to keep your urine pale yellow.  Take over-the-counter or prescription medicines.  Eat foods that are high in fiber, such as beans, whole grains, and fresh fruits and vegetables.  Limit foods that are high in fat and processed sugars, such as fried or sweet foods.  Keep all follow-up visits. This is important. Contact a health care provider if:  You have a fever.  Your knee becomes red, tender, or swollen.  Your pain medicine is not controlling your pain.  Your symptoms get worse or do not improve after 2 weeks of home care. Summary  A meniscus tear is a knee injury that happens when a piece of the meniscus is torn.  Treatment for this injury depends on the severity of the tear. You may need surgery if you have a severe tear or if other treatments fail.  Rest, ice, and raise (elevate) your injured knee, as told by your health care provider. This will help lessen pain and swelling.  Contact a health care provider if you have new symptoms, your symptoms worsen, or they do not improve after 2 weeks of home care.  Keep all follow-up  visits. This is important. This information is not intended to replace advice given to you by your health care provider. Make sure you discuss any questions you have with your health care provider. Document Revised: 01/04/2020 Document Reviewed: 01/04/2020 Elsevier Patient Education  2021 [employer]vier Inc.  

## 2020-12-01 ENCOUNTER — Encounter: Payer: Self-pay | Admitting: Physician Assistant

## 2020-12-02 ENCOUNTER — Other Ambulatory Visit: Payer: Self-pay

## 2020-12-05 DIAGNOSIS — M1712 Unilateral primary osteoarthritis, left knee: Secondary | ICD-10-CM | POA: Diagnosis not present

## 2020-12-06 DIAGNOSIS — Z419 Encounter for procedure for purposes other than remedying health state, unspecified: Secondary | ICD-10-CM | POA: Diagnosis not present

## 2020-12-11 DIAGNOSIS — F331 Major depressive disorder, recurrent, moderate: Secondary | ICD-10-CM | POA: Diagnosis not present

## 2020-12-11 DIAGNOSIS — F411 Generalized anxiety disorder: Secondary | ICD-10-CM | POA: Diagnosis not present

## 2020-12-16 ENCOUNTER — Encounter: Payer: Self-pay | Admitting: Physician Assistant

## 2020-12-16 DIAGNOSIS — B373 Candidiasis of vulva and vagina: Secondary | ICD-10-CM

## 2020-12-16 DIAGNOSIS — B3731 Acute candidiasis of vulva and vagina: Secondary | ICD-10-CM

## 2020-12-16 DIAGNOSIS — F331 Major depressive disorder, recurrent, moderate: Secondary | ICD-10-CM | POA: Diagnosis not present

## 2020-12-17 MED ORDER — TERCONAZOLE 0.4 % VA CREA: TOPICAL_CREAM | VAGINAL | 0 refills | Status: DC

## 2020-12-23 DIAGNOSIS — Z7984 Long term (current) use of oral hypoglycemic drugs: Secondary | ICD-10-CM | POA: Diagnosis not present

## 2020-12-23 DIAGNOSIS — R7303 Prediabetes: Secondary | ICD-10-CM | POA: Diagnosis not present

## 2020-12-23 DIAGNOSIS — Z713 Dietary counseling and surveillance: Secondary | ICD-10-CM | POA: Diagnosis not present

## 2020-12-23 DIAGNOSIS — Z79899 Other long term (current) drug therapy: Secondary | ICD-10-CM | POA: Diagnosis not present

## 2020-12-23 DIAGNOSIS — K219 Gastro-esophageal reflux disease without esophagitis: Secondary | ICD-10-CM | POA: Diagnosis not present

## 2020-12-23 DIAGNOSIS — Z6841 Body Mass Index (BMI) 40.0 and over, adult: Secondary | ICD-10-CM | POA: Diagnosis not present

## 2020-12-24 ENCOUNTER — Other Ambulatory Visit: Payer: Self-pay

## 2020-12-24 DIAGNOSIS — J309 Allergic rhinitis, unspecified: Secondary | ICD-10-CM

## 2020-12-24 MED ORDER — MONTELUKAST SODIUM 10 MG PO TABS
10.0000 mg | ORAL_TABLET | Freq: Every day | ORAL | 0 refills | Status: DC
Start: 2020-12-24 — End: 2021-03-26

## 2020-12-24 NOTE — Telephone Encounter (Signed)
Pt made an appt  °

## 2020-12-24 NOTE — Telephone Encounter (Signed)
Last appt:3/22 Next:not scheduled

## 2021-01-05 DIAGNOSIS — Z419 Encounter for procedure for purposes other than remedying health state, unspecified: Secondary | ICD-10-CM | POA: Diagnosis not present

## 2021-01-11 ENCOUNTER — Encounter: Payer: Self-pay | Admitting: Family Medicine

## 2021-01-13 ENCOUNTER — Encounter: Payer: Self-pay | Admitting: Family Medicine

## 2021-01-13 ENCOUNTER — Ambulatory Visit (INDEPENDENT_AMBULATORY_CARE_PROVIDER_SITE_OTHER): Payer: Medicaid Other | Admitting: Family Medicine

## 2021-01-13 VITALS — Temp 98.2°F | Ht 61.0 in | Wt 304.0 lb

## 2021-01-13 DIAGNOSIS — J4520 Mild intermittent asthma, uncomplicated: Secondary | ICD-10-CM

## 2021-01-13 DIAGNOSIS — M419 Scoliosis, unspecified: Secondary | ICD-10-CM | POA: Insufficient documentation

## 2021-01-13 DIAGNOSIS — J45909 Unspecified asthma, uncomplicated: Secondary | ICD-10-CM | POA: Insufficient documentation

## 2021-01-13 DIAGNOSIS — U071 COVID-19: Secondary | ICD-10-CM

## 2021-01-13 DIAGNOSIS — M5030 Other cervical disc degeneration, unspecified cervical region: Secondary | ICD-10-CM | POA: Insufficient documentation

## 2021-01-13 DIAGNOSIS — Z6841 Body Mass Index (BMI) 40.0 and over, adult: Secondary | ICD-10-CM

## 2021-01-13 DIAGNOSIS — M48061 Spinal stenosis, lumbar region without neurogenic claudication: Secondary | ICD-10-CM | POA: Insufficient documentation

## 2021-01-13 MED ORDER — PAXLOVID 20 X 150 MG & 10 X 100MG PO TBPK: 3.0000 | ORAL_TABLET | Freq: Two times a day (BID) | ORAL | 0 refills | Status: DC

## 2021-01-13 NOTE — Progress Notes (Addendum)
Name: [name]   MRN: [id number]    DOB: October 09, 1975   Date:01/13/2021       Progress Note   I connected with  [name] on 01/14/21 by telephone application and verified that I am speaking with the correct person using two identifiers.   I discussed the limitations of evaluation and management by telemedicine. The patient expressed understanding and agreed to proceed.  Subjective  Chief Complaint  COVID-19 Positive  HPI  COVID-19: daughter had symptoms 8 days ago, patient noticed scratchy throat on May 6th, 2022 and after that she developed body aches, fatigue, she is also having nasal congestion, sometimes rhinorrhea that is clear, she states symptoms worse today and has noticed lack of appetite, facial pressure and increase in body aches . She has a history of asthma and has noticed some chest tightness but no sob or wheezing. Taking tylenol only, did not feel like she needed albuterol yet . Test was done at home on Saturday May 7 th, 2022   She has a pulse ox at home, but has not checked her level yet, explained to go to [facility] if pulse ox below 90 %   She had three doses of Moderma.   Risk factor if Asthma and obesity   Patient Active Problem List   Diagnosis Date Noted  . DDD (degenerative disc disease), cervical 01/13/2021  . Asthma 01/13/2021  . Scoliosis deformity of spine 01/13/2021  . Spinal stenosis of lumbar region 01/13/2021  . Current moderate episode of major depressive disorder without prior episode ([place]) 12/20/2019  . Scapular dysfunction 12/20/2019  . Chronic pain of right knee 12/20/2019  . Depression 02/21/2019  . Class 3 severe obesity with serious comorbidity and body mass index (BMI) of 50.0 to 59.9 in adult ([place]) 10/20/2018  . Mild intermittent asthma with acute exacerbation 05/13/2018  . Allergic rhinitis 03/09/2018  . Tension headache 03/09/2018  . Other fatigue 12/07/2017  . Shortness of breath on exertion 12/07/2017  . Other specified  hypothyroidism 12/07/2017  . Prediabetes 10/15/2017  . Gastroesophageal reflux disease without esophagitis 10/15/2017  . Nasal valve collapse 10/15/2017  . 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/01/2017  . Chronic midline back pain 10/01/2017  . Vitamin D deficiency 10/01/2017  . Anxiety 10/01/2017  . PCOS (polycystic ovarian syndrome) 10/01/2017  . Severe episode of recurrent major depressive disorder, without psychotic features ([place]) 10/01/2017  . BMI 50.0-59.9, adult ([place]) 10/01/2017  . Fibromyalgia 10/01/2017  . Postoperative hypothyroidism 10/01/2017  . OSA (obstructive sleep apnea) 02/26/2014    Past Surgical History:  Procedure Laterality Date  . ABDOMINAL HYSTERECTOMY    . CESAREAN SECTION    . CHOLECYSTECTOMY    . SPINAL FUSION    . THYROIDECTOMY    . TONSILLECTOMY    . TUBAL LIGATION      Family History  Problem Relation Age of Onset  . Cancer Maternal Uncle   . Heart disease Father   . High Cholesterol Father   . Heart disease Paternal Grandfather   . Breast cancer Maternal Grandmother   . Rheum arthritis Mother   . Obesity Mother   . Rheum arthritis Maternal Grandfather   . Rheum arthritis Maternal Aunt     Social History   Tobacco Use  . Smoking status: Former Smoker    Packs/day: 0.25    Years: 12.00    Pack years: 3.00    Types: Cigarettes    Quit date: 09/07/2004    Years since quitting: 16.3  .  Smokeless tobacco: Never Used  Substance Use Topics  . Alcohol use: Yes    Comment: 4 times a year     Current Outpatient Medications:  .  acetaminophen (TYLENOL) 325 MG tablet, Take 650 mg by mouth every 6 (six) hours as needed., Disp: , Rfl:  .  albuterol (PROVENTIL) (2.5 MG/3ML) 0.083% nebulizer solution, Take 3 mLs (2.5 mg total) by nebulization every 6 (six) hours as needed for wheezing or shortness of breath., Disp: 150 mL, Rfl: 1 .  albuterol (VENTOLIN HFA) 108 (90 Base) MCG/ACT inhaler, Inhale 2 puffs into the lungs every 6 (six) hours as needed  for wheezing or shortness of breath., Disp: 6.7 g, Rfl: 3 .  Ascorbic Acid (VITAMIN C) 1000 MG tablet, Take 1,000 mg by mouth daily., Disp: , Rfl:  .  budesonide-formoterol (SYMBICORT) 80-4.5 MCG/ACT inhaler, Inhale 2 puffs into the lungs 2 (two) times daily., Disp: 1 Inhaler, Rfl: 3 .  clonazePAM (KLONOPIN) 0.5 MG tablet, Take 0.5 mg by mouth 3 (three) times daily as needed for anxiety., Disp: , Rfl:  .  cyclobenzaprine (FLEXERIL) 10 MG tablet, Take 0.5-1 tablets (5-10 mg total) by mouth 3 (three) times daily as needed for muscle spasms. Take mainly at bedtime as may make drowsy, Disp: 30 tablet, Rfl: 1 .  diclofenac (VOLTAREN) 75 MG EC tablet, TAKE 1 TABLET BY MOUTH ONCE DAILY AS NEEDED FOR MILD TO MODERATE PAIN, Disp: 30 tablet, Rfl: 0 .  DULoxetine (CYMBALTA) 30 MG capsule, Take 30 mg by mouth daily., Disp: , Rfl:  .  estradiol (VIVELLE-DOT) 0.05 MG/24HR patch, Place 1 patch onto the skin 2 (two) times a week., Disp: , Rfl:  .  ferrous sulfate 325 (65 FE) MG tablet, Take 325 mg by mouth daily with breakfast., Disp: , Rfl:  .  fexofenadine (ALLEGRA) 180 MG tablet, Take 1 tablet (180 mg total) by mouth daily., Disp: 90 tablet, Rfl: 1 .  gabapentin (NEURONTIN) 100 MG capsule, Take 100 mg by mouth 3 (three) times daily., Disp: , Rfl:  .  ibuprofen (ADVIL,MOTRIN) 200 MG tablet, Take 200 mg by mouth every 6 (six) hours as needed., Disp: , Rfl:  .  metFORMIN (GLUCOPHAGE) 500 MG tablet, TAKE 1 TABLET BY MOUTH 2 TIMES DAILY WITH A MEAL, Disp: 180 tablet, Rfl: 0 .  montelukast (SINGULAIR) 10 MG tablet, Take 1 tablet (10 mg total) by mouth at bedtime., Disp: 90 tablet, Rfl: 0 .  Nirmatrelvir & Ritonavir (PAXLOVID) 20 x 150 MG & 10 x 100MG  TBPK, Take 3 tablets by mouth in the morning and at bedtime., Disp: 30 each, Rfl: 0 .  pantoprazole (PROTONIX) 40 MG tablet, Take 40 mg by mouth daily., Disp: , Rfl:  .  progesterone (PROMETRIUM) 100 MG capsule, progesterone micronized 100 mg capsule  TAKE ONE CAPSULE BY  MOUTH EVERY DAY, Disp: , Rfl:  .  terconazole (TERAZOL 7) 0.4 % vaginal cream, Apply one layer twice daily to affected area., Disp: 45 g, Rfl: 0 .  VITAMIN D, CHOLECALCIFEROL, PO, Take 50 mcg by mouth 2 (two) times daily., Disp: , Rfl:  .  zinc gluconate 50 MG tablet, Take 50 mg by mouth daily., Disp: , Rfl:  .  zolpidem (AMBIEN) 5 MG tablet, Take 10 mg by mouth at bedtime as needed., Disp: , Rfl:  .  ARMOUR THYROID 120 MG tablet, TAKE 1 TABLET BY MOUTH ONCE A DAY BEFOREBREAKFAST (Patient not taking: Reported on 01/13/2021), Disp: 90 tablet, Rfl: 2 .  Azelastine-Fluticasone 137-50 MCG/ACT SUSP,  Place 1 spray into the nose daily. (Patient not taking: Reported on 01/13/2021), Disp: , Rfl:  .  citalopram (CELEXA) 20 MG tablet, citalopram 20 mg tablet (Patient not taking: Reported on 01/13/2021), Disp: , Rfl:   Allergies  Allergen Reactions  . Erythromycin   . Erythromycin Base   . Zyrtec [Cetirizine Hcl]     Only allergic to Zyrtec D -     I personally reviewed allergies, with the patient/caregiver today.   ROS  Ten systems reviewed and is negative except as mentioned in HPI    Objective  Vitals:   01/13/21 1433  Temp: 98.2 F (36.8 C)  TempSrc: Oral  Weight: (!) 304 lb (137.9 kg)  Height: 5\' 1"  (1.549 m)    Body mass index is 57.44 kg/m.  Physical Exam  Awake, alert and oriented, sounded nasally   PHQ2/9: Depression screen [facility] 2/9 01/13/2021 11/29/2020 12/20/2019 08/01/2019 03/28/2019  Decreased Interest 1 3 2  0 1  Down, Depressed, Hopeless 1 3 2  0 1  PHQ - 2 Score 2 6 4  0 2  Altered sleeping 3 3 3  0 2  Tired, decreased energy 3 0 3 0 2  Change in appetite 1 0 2 0 0  Feeling bad or failure about yourself  1 0 3 0 1  Trouble concentrating 1 0 1 0 1  Moving slowly or fidgety/restless 0 1 0 0 0  Suicidal thoughts 0 0 0 0 0  PHQ-9 Score 11 10 16  0 8  Difficult doing work/chores Somewhat difficult Somewhat difficult Somewhat difficult Not difficult at all Very difficult  Some  recent data might be hidden    phq 9 is positive   Fall Risk: Fall Risk  01/13/2021 11/29/2020 12/20/2019 08/01/2019 03/28/2019  Falls in the past year? 0 0 0 1 1  Number falls in past yr: 0 0 0 0 1  Injury with Fall? 0 0 0 1 1  Follow up Falls prevention discussed - Falls evaluation completed - -      Assessment & Plan  1. COVID-19  - Nirmatrelvir & Ritonavir (PAXLOVID) 20 x 150 MG & 10 x 100MG  TBPK; Take 3 tablets by mouth in the morning and at bedtime.  Dispense: 30 each; Refill: 0  2. BMI 50.0-59.9, adult (HCC)  - Nirmatrelvir & Ritonavir (PAXLOVID) 20 x 150 MG & 10 x 100MG  TBPK; Take 3 tablets by mouth in the morning and at bedtime.  Dispense: 30 each; Refill: 0  3. Mild intermittent asthma without complication  - Nirmatrelvir & Ritonavir (PAXLOVID) 20 x 150 MG & 10 x 100MG  TBPK; Take 3 tablets by mouth in the morning and at bedtime.  Dispense: 30 each; Refill: 0  Spent 15 minutes on the phone with patient, advised her to monitor pulse ox and if goes below 90 % to got to [facility] or if worsening of symptoms

## 2021-01-14 ENCOUNTER — Other Ambulatory Visit: Payer: Self-pay | Admitting: Family Medicine

## 2021-01-14 DIAGNOSIS — E038 Other specified hypothyroidism: Secondary | ICD-10-CM

## 2021-01-14 MED ORDER — ARMOUR THYROID 120 MG PO TABS: ORAL_TABLET | ORAL | 0 refills | Status: DC

## 2021-01-14 NOTE — Telephone Encounter (Signed)
Copied from [place] [id number]. Topic: Quick Communication - Rx Refill/Question >> Jan 14, 2021  1:14 PM Leward Quan A wrote: Medication: ARMOUR THYROID 120 MG tablet  Completely out for a few days need this today please   Has the patient contacted their pharmacy? Yes.   (Agent: If no, request that the patient contact the pharmacy for the refill.) (Agent: If yes, when and what did the pharmacy advise?)  Preferred Pharmacy (with phone number or street name): [place], [place] [facility] [place]  Phone:  [phone] Fax:  [phone]     Agent: Please be advised that RX refills may take up to 3 business days. We ask that you follow-up with your pharmacy.

## 2021-01-14 NOTE — Telephone Encounter (Signed)
Appointment 03/26/21- due TSH labs

## 2021-01-20 ENCOUNTER — Encounter: Payer: Self-pay | Admitting: Family Medicine

## 2021-01-23 ENCOUNTER — Encounter: Payer: Self-pay | Admitting: Family Medicine

## 2021-02-02 DIAGNOSIS — Z01818 Encounter for other preprocedural examination: Secondary | ICD-10-CM | POA: Diagnosis not present

## 2021-02-05 DIAGNOSIS — K219 Gastro-esophageal reflux disease without esophagitis: Secondary | ICD-10-CM | POA: Diagnosis not present

## 2021-02-05 DIAGNOSIS — Z1211 Encounter for screening for malignant neoplasm of colon: Secondary | ICD-10-CM | POA: Diagnosis not present

## 2021-02-05 DIAGNOSIS — K2970 Gastritis, unspecified, without bleeding: Secondary | ICD-10-CM | POA: Diagnosis not present

## 2021-02-05 DIAGNOSIS — M797 Fibromyalgia: Secondary | ICD-10-CM | POA: Diagnosis not present

## 2021-02-05 DIAGNOSIS — K2950 Unspecified chronic gastritis without bleeding: Secondary | ICD-10-CM | POA: Diagnosis not present

## 2021-02-05 DIAGNOSIS — K317 Polyp of stomach and duodenum: Secondary | ICD-10-CM | POA: Diagnosis not present

## 2021-02-05 DIAGNOSIS — Z419 Encounter for procedure for purposes other than remedying health state, unspecified: Secondary | ICD-10-CM | POA: Diagnosis not present

## 2021-02-05 DIAGNOSIS — K319 Disease of stomach and duodenum, unspecified: Secondary | ICD-10-CM | POA: Diagnosis not present

## 2021-02-05 DIAGNOSIS — E669 Obesity, unspecified: Secondary | ICD-10-CM | POA: Diagnosis not present

## 2021-02-05 DIAGNOSIS — Z6841 Body Mass Index (BMI) 40.0 and over, adult: Secondary | ICD-10-CM | POA: Diagnosis not present

## 2021-02-05 DIAGNOSIS — K5730 Diverticulosis of large intestine without perforation or abscess without bleeding: Secondary | ICD-10-CM | POA: Diagnosis not present

## 2021-02-05 DIAGNOSIS — J4520 Mild intermittent asthma, uncomplicated: Secondary | ICD-10-CM | POA: Diagnosis not present

## 2021-02-05 DIAGNOSIS — Z87891 Personal history of nicotine dependence: Secondary | ICD-10-CM | POA: Diagnosis not present

## 2021-02-05 DIAGNOSIS — Z01818 Encounter for other preprocedural examination: Secondary | ICD-10-CM | POA: Diagnosis not present

## 2021-02-05 DIAGNOSIS — K449 Diaphragmatic hernia without obstruction or gangrene: Secondary | ICD-10-CM | POA: Diagnosis not present

## 2021-02-05 DIAGNOSIS — G4733 Obstructive sleep apnea (adult) (pediatric): Secondary | ICD-10-CM | POA: Diagnosis not present

## 2021-02-05 LAB — HM COLONOSCOPY

## 2021-02-07 DIAGNOSIS — Z6841 Body Mass Index (BMI) 40.0 and over, adult: Secondary | ICD-10-CM | POA: Diagnosis not present

## 2021-02-10 ENCOUNTER — Encounter (INDEPENDENT_AMBULATORY_CARE_PROVIDER_SITE_OTHER): Payer: Self-pay | Admitting: Family Medicine

## 2021-02-10 ENCOUNTER — Other Ambulatory Visit: Payer: Self-pay

## 2021-02-10 ENCOUNTER — Ambulatory Visit (INDEPENDENT_AMBULATORY_CARE_PROVIDER_SITE_OTHER): Payer: Medicaid Other | Admitting: Family Medicine

## 2021-02-10 VITALS — BP 112/74 | HR 90 | Temp 98.0°F | Ht 61.0 in | Wt 300.0 lb

## 2021-02-10 DIAGNOSIS — Z6841 Body Mass Index (BMI) 40.0 and over, adult: Secondary | ICD-10-CM | POA: Diagnosis not present

## 2021-02-10 DIAGNOSIS — R7303 Prediabetes: Secondary | ICD-10-CM

## 2021-02-10 MED ORDER — METFORMIN HCL 500 MG PO TABS: ORAL_TABLET | ORAL | 0 refills | Status: DC

## 2021-02-13 DIAGNOSIS — Z7189 Other specified counseling: Secondary | ICD-10-CM | POA: Diagnosis not present

## 2021-02-13 DIAGNOSIS — Z6841 Body Mass Index (BMI) 40.0 and over, adult: Secondary | ICD-10-CM | POA: Diagnosis not present

## 2021-02-13 DIAGNOSIS — M797 Fibromyalgia: Secondary | ICD-10-CM | POA: Diagnosis not present

## 2021-02-13 DIAGNOSIS — G4733 Obstructive sleep apnea (adult) (pediatric): Secondary | ICD-10-CM | POA: Diagnosis not present

## 2021-02-13 DIAGNOSIS — K449 Diaphragmatic hernia without obstruction or gangrene: Secondary | ICD-10-CM | POA: Diagnosis not present

## 2021-02-18 NOTE — Progress Notes (Signed)
Chief Complaint:   OBESITY [name] is here to discuss her progress with her obesity treatment plan along with follow-up of her obesity related diagnoses. [name] is on practicing portion control and making smarter food choices, such as increasing vegetables and decreasing simple carbohydrates and states she is following her eating plan approximately 50% of the time. [name] states she is walking for 10-15 minutes 2 times per week.  Today's visit was #: 24 Starting weight: 292 lbs Starting date: 12/07/2017 Today's weight: 300 lbs Today's date: 02/10/2021 Total lbs lost to date: 0 Total lbs lost since last in-office visit: 0  Interim History: [name] is getting prepared to have weight loss surgery. She is seeing a dietician and she had her colonoscopy. She still has a Psychology appointment and then an appointment with her surgeon. She is working on increasing protein and trying to not skip meals.  Subjective:   1. Pre-diabetes [name] is stable on metformin, and she is working on decreasing simple carbohydrates in her diet. She denies nausea or vomiting.  Assessment/Plan:   1. Pre-diabetes [name] will continue to work on diet, exercise, and decreasing simple carbohydrates to help decrease the risk of diabetes. We will refill metformin for 90 days with no refills.   - metFORMIN (GLUCOPHAGE) 500 MG tablet; TAKE 1 TABLET BY MOUTH 2 TIMES DAILY WITH A MEAL  Dispense: 180 tablet; Refill: 0  2. Obesity with current BMI 56.8 [name] is currently in the action stage of change. As such, her goal is to continue with weight loss efforts. She has agreed to practicing portion control and making smarter food choices, such as increasing vegetables and decreasing simple carbohydrates.   Exercise goals: As is.  Behavioral modification strategies: increasing lean protein intake, increasing vegetables, and no skipping meals.  [name] has agreed to follow-up with our clinic in 12 weeks. She was informed of the  importance of frequent follow-up visits to maximize her success with intensive lifestyle modifications for her multiple health conditions.   Objective:   Blood pressure 112/74, pulse 90, temperature 98 F (36.7 C), height 5\' 1"  (1.549 m), weight 300 lb (136.1 kg), SpO2 98 %. Body mass index is 56.68 kg/m.  General: Cooperative, alert, well developed, in no acute distress. HEENT: Conjunctivae and lids unremarkable. Cardiovascular: Regular rhythm.  Lungs: Normal work of breathing. Neurologic: No focal deficits.   Lab Results  Component Value Date   CREATININE 0.70 07/22/2020   BUN 11 07/22/2020   NA 141 07/22/2020   K 4.2 07/22/2020   CL 103 07/22/2020   CO2 23 07/22/2020   Lab Results  Component Value Date   ALT 51 (H) 07/22/2020   AST 38 07/22/2020   ALKPHOS 80 07/22/2020   BILITOT 0.4 07/22/2020   Lab Results  Component Value Date   HGBA1C 5.7 (H) 07/22/2020   HGBA1C 5.7 (H) 12/07/2019   HGBA1C 5.8 (H) 08/07/2019   HGBA1C 5.6 08/29/2018   HGBA1C 5.8 (H) 04/27/2018   Lab Results  Component Value Date   INSULIN 33.2 (H) 07/22/2020   INSULIN 24.7 12/07/2019   INSULIN 26.1 (H) 08/07/2019   INSULIN 29.5 (H) 08/29/2018   INSULIN 46.1 (H) 04/27/2018   Lab Results  Component Value Date   TSH 1.670 12/07/2019   Lab Results  Component Value Date   CHOL 150 12/07/2019   HDL 45 12/07/2019   LDLCALC 80 12/07/2019   TRIG 143 12/07/2019   CHOLHDL 2.9 10/08/2017   Lab Results  Component Value Date  WBC 5.6 12/07/2019   HGB 14.0 12/07/2019   HCT 41.6 12/07/2019   MCV 86 12/07/2019   PLT 244 12/07/2019   No results found for: IRON, TIBC, FERRITIN  Attestation Statements:   Reviewed by clinician on day of visit: allergies, medications, problem list, medical history, surgical history, family history, social history, and previous encounter notes.  Time spent on visit including pre-visit chart review and post-visit care and charting was 30 minutes.    I, Trixie Dredge, am acting as transcriptionist for Dennard Nip, MD.  I have reviewed the above documentation for accuracy and completeness, and I agree with the above. -  Dennard Nip, MD

## 2021-02-19 DIAGNOSIS — F54 Psychological and behavioral factors associated with disorders or diseases classified elsewhere: Secondary | ICD-10-CM | POA: Diagnosis not present

## 2021-03-07 DIAGNOSIS — Z419 Encounter for procedure for purposes other than remedying health state, unspecified: Secondary | ICD-10-CM | POA: Diagnosis not present

## 2021-03-19 ENCOUNTER — Encounter: Payer: Self-pay | Admitting: Unknown Physician Specialty

## 2021-03-19 ENCOUNTER — Other Ambulatory Visit: Payer: Self-pay

## 2021-03-19 ENCOUNTER — Ambulatory Visit: Payer: Medicaid Other | Admitting: Unknown Physician Specialty

## 2021-03-19 VITALS — BP 130/80 | HR 98 | Temp 98.2°F | Resp 18 | Ht 61.0 in | Wt 304.0 lb

## 2021-03-19 DIAGNOSIS — M7661 Achilles tendinitis, right leg: Secondary | ICD-10-CM | POA: Diagnosis not present

## 2021-03-19 DIAGNOSIS — M1712 Unilateral primary osteoarthritis, left knee: Secondary | ICD-10-CM | POA: Diagnosis not present

## 2021-03-19 DIAGNOSIS — Z6841 Body Mass Index (BMI) 40.0 and over, adult: Secondary | ICD-10-CM

## 2021-03-19 MED ORDER — DICLOFENAC SODIUM 1 % EX GEL: 4.0000 g | Freq: Four times a day (QID) | CUTANEOUS | 12 refills | Status: DC

## 2021-03-19 NOTE — Progress Notes (Signed)
BP 130/80   Pulse 98   Temp 98.2 F (36.8 C) (Oral)   Resp 18   Ht 5\' 1"  (1.549 m)   Wt (!) 304 lb (137.9 kg)   SpO2 97%   BMI 57.44 kg/m    Subjective:    Patient ID: [name], female    DOB: 1975/12/25, 45 y.o.   MRN: [id number]  HPI: [name] is a 45 y.o. female  Chief Complaint  Patient presents with   Ankle Injury    Right ankle  swelling for 3 months   Pt with severe pain of right posterior ankle for the last 6-8 weeks following a lot of walking .  Ever since then she has had increasing pain at the back of her right.  Unable to see [facility] due to past balance that she was unaware of after back surgery years ago.    She is also asking to have a left knee injection.  The last was 4 months ago at [facility] and diagnosed with OA and helped by injection.  Was told "a ton of arthritis."  Has not been to PT.  X-ray report confirms degenerative changes.    Planning on gastric bypass and is "one appt away" from getting it scheduled.    Relevant past medical, surgical, family and social history reviewed and updated as indicated. Interim medical history since our last visit reviewed. Allergies and medications reviewed and updated.  Review of Systems  Per HPI unless specifically indicated above     Objective:    BP 130/80   Pulse 98   Temp 98.2 F (36.8 C) (Oral)   Resp 18   Ht 5\' 1"  (1.549 m)   Wt (!) 304 lb (137.9 kg)   SpO2 97%   BMI 57.44 kg/m   Wt Readings from Last 3 Encounters:  03/19/21 (!) 304 lb (137.9 kg)  02/10/21 300 lb (136.1 kg)  01/13/21 (!) 304 lb (137.9 kg)    Physical Exam Constitutional:      General: She is not in acute distress.    Appearance: Normal appearance. She is well-developed.  HENT:     Head: Normocephalic and atraumatic.  Eyes:     General: Lids are normal. No scleral icterus.       Right eye: No discharge.        Left eye: No discharge.     Conjunctiva/sclera: Conjunctivae normal.  Cardiovascular:     Rate  and Rhythm: Normal rate.  Pulmonary:     Effort: Pulmonary effort is normal.  Abdominal:     Palpations: There is no hepatomegaly or splenomegaly.  Musculoskeletal:     Right knee: Normal.     Left knee: Crepitus present.     Comments: Tenderness back of right ankle wat achilles insertion site.    Skin:    Coloration: Skin is not pale.     Findings: No rash.  Neurological:     Mental Status: She is alert and oriented to person, place, and time.  Psychiatric:        Behavior: Behavior normal.        Thought Content: Thought content normal.        Judgment: Judgment normal.    Results for orders placed or performed in visit on 07/22/20  Comprehensive metabolic panel  Result Value Ref Range   Glucose 112 (H) 65 - 99 mg/dL   BUN 11 6 - 24 mg/dL   Creatinine, Ser 0.70 0.57 -  1.00 mg/dL   GFR calc non Af Amer 105 >59 mL/min/1.73   GFR calc Af Amer 121 >59 mL/min/1.73   BUN/Creatinine Ratio 16 9 - 23   Sodium 141 134 - 144 mmol/L   Potassium 4.2 3.5 - 5.2 mmol/L   Chloride 103 96 - 106 mmol/L   CO2 23 20 - 29 mmol/L   Calcium 9.6 8.7 - 10.2 mg/dL   Total Protein 7.7 6.0 - 8.5 g/dL   Albumin 4.5 3.8 - 4.8 g/dL   Globulin, Total 3.2 1.5 - 4.5 g/dL   Albumin/Globulin Ratio 1.4 1.2 - 2.2   Bilirubin Total 0.4 0.0 - 1.2 mg/dL   Alkaline Phosphatase 80 44 - 121 IU/L   AST 38 0 - 40 IU/L   ALT 51 (H) 0 - 32 IU/L  Hemoglobin A1c  Result Value Ref Range   Hgb A1c MFr Bld 5.7 (H) 4.8 - 5.6 %   Est. average glucose Bld gHb Est-mCnc 117 mg/dL  Insulin, random  Result Value Ref Range   INSULIN 33.2 (H) 2.6 - 24.9 uIU/mL  VITAMIN D 25 Hydroxy (Vit-D Deficiency, Fractures)  Result Value Ref Range   Vit D, 25-Hydroxy 50.4 30.0 - 100.0 ng/mL      STEROID INJECTION  Procedure: Knee Intraarticular Steroid Injection   Description: After verbal consent and patient ed on Area prepped and draped using  semi-sterile technique. Using a anterior  approach, a mixture of 1 cc of Lidocaine & 1  cc of Kenalog 40 was injected into knee joint.  A bandage was then placed over the injection site. Complications:  none Post Procedure Instructions: To the ER if any symptoms of erythema or swelling.   F Assessment & Plan:   Problem List Items Addressed This Visit       Unprioritized   BMI 50.0-59.9, adult ([place])    Planning gastric bypass       Primary osteoarthritis of left knee - Primary    Significant degenerative changes on x-ray.  Will give injection today as noted above.  Tolerated well.  Refer to Orthopedics for further management.  Weight loss will help       Relevant Orders   AMB referral to orthopedics   Tendonitis, Achilles, right    Very tender.  Voltaren gel for external use.  Tylenol prn.  Refer to Orthopedics as I suspect it will need to be immobilized.         Relevant Orders   AMB referral to orthopedics     Follow up plan: Return if symptoms worsen or fail to improve.

## 2021-03-19 NOTE — Assessment & Plan Note (Signed)
Planning gastric bypass

## 2021-03-19 NOTE — Assessment & Plan Note (Signed)
Very tender.  Voltaren gel for external use.  Tylenol prn.  Refer to Orthopedics as I suspect it will need to be immobilized.

## 2021-03-19 NOTE — Assessment & Plan Note (Signed)
Significant degenerative changes on x-ray.  Will give injection today as noted above.  Tolerated well.  Refer to Orthopedics for further management.  Weight loss will help

## 2021-03-20 MED ORDER — LIDOCAINE HCL (PF) 1 % IJ SOLN
2.0000 mL | Freq: Once | INTRAMUSCULAR | Status: AC
  Administered 2021-03-19: 2 mL via INTRADERMAL

## 2021-03-20 MED ORDER — TRIAMCINOLONE ACETONIDE 40 MG/ML IJ SUSP
40.0000 mg | Freq: Once | INTRAMUSCULAR | Status: AC
  Administered 2021-03-19: 40 mg via INTRAMUSCULAR

## 2021-03-20 NOTE — Addendum Note (Signed)
Addended by: Allyiah Gartner G on: 03/20/2021 11:15 AM   Modules accepted: Orders

## 2021-03-25 DIAGNOSIS — Z0000 Encounter for general adult medical examination without abnormal findings: Secondary | ICD-10-CM | POA: Diagnosis not present

## 2021-03-25 DIAGNOSIS — Z1231 Encounter for screening mammogram for malignant neoplasm of breast: Secondary | ICD-10-CM | POA: Diagnosis not present

## 2021-03-25 DIAGNOSIS — Z01419 Encounter for gynecological examination (general) (routine) without abnormal findings: Secondary | ICD-10-CM | POA: Diagnosis not present

## 2021-03-25 LAB — HM MAMMOGRAPHY

## 2021-03-26 ENCOUNTER — Other Ambulatory Visit: Payer: Self-pay

## 2021-03-26 ENCOUNTER — Encounter: Payer: Self-pay | Admitting: Family Medicine

## 2021-03-26 ENCOUNTER — Ambulatory Visit: Payer: Medicaid Other | Admitting: Family Medicine

## 2021-03-26 VITALS — BP 126/80 | HR 100 | Temp 97.8°F | Resp 16 | Ht 61.0 in | Wt 303.0 lb

## 2021-03-26 DIAGNOSIS — E8881 Metabolic syndrome: Secondary | ICD-10-CM

## 2021-03-26 DIAGNOSIS — E038 Other specified hypothyroidism: Secondary | ICD-10-CM

## 2021-03-26 DIAGNOSIS — J4520 Mild intermittent asthma, uncomplicated: Secondary | ICD-10-CM

## 2021-03-26 DIAGNOSIS — R7303 Prediabetes: Secondary | ICD-10-CM | POA: Diagnosis not present

## 2021-03-26 DIAGNOSIS — Z5181 Encounter for therapeutic drug level monitoring: Secondary | ICD-10-CM

## 2021-03-26 DIAGNOSIS — F321 Major depressive disorder, single episode, moderate: Secondary | ICD-10-CM

## 2021-03-26 DIAGNOSIS — E88819 Insulin resistance, unspecified: Secondary | ICD-10-CM

## 2021-03-26 DIAGNOSIS — Z6841 Body Mass Index (BMI) 40.0 and over, adult: Secondary | ICD-10-CM | POA: Diagnosis not present

## 2021-03-26 DIAGNOSIS — J309 Allergic rhinitis, unspecified: Secondary | ICD-10-CM | POA: Diagnosis not present

## 2021-03-26 DIAGNOSIS — R7989 Other specified abnormal findings of blood chemistry: Secondary | ICD-10-CM

## 2021-03-26 DIAGNOSIS — E785 Hyperlipidemia, unspecified: Secondary | ICD-10-CM | POA: Diagnosis not present

## 2021-03-26 DIAGNOSIS — E559 Vitamin D deficiency, unspecified: Secondary | ICD-10-CM

## 2021-03-26 MED ORDER — MONTELUKAST SODIUM 10 MG PO TABS: 10.0000 mg | ORAL_TABLET | Freq: Every day | ORAL | 3 refills | Status: DC

## 2021-03-26 NOTE — Progress Notes (Signed)
Name: [name]   MRN: [id number]    DOB: 28-Jul-1976   Date:03/26/2021       Progress Note  Chief Complaint  Patient presents with   Asthma   Allergic Rhinitis    Hypothyroidism     Subjective:   [name] is a 45 y.o. female, presents to clinic for routine f/up  Hyopothyroid on armour thyroid- feels very tired, due for labs and recheck, on 120 mcg daily dose  Hx of prediabetes, obesity, insulin resistance, on metformin per weight management specialists Lab Results  Component Value Date   HGBA1C 5.7 (H) 07/22/2020   HGBA1C 5.7 (H) 12/07/2019   HGBA1C 5.8 (H) 08/07/2019   Anxiety and MDD on celexa, klonopin in the past - per psychiatry  PHQ is positive today - score 12, a lto of fatigue and chronic pain, no SI Wonders if thyroid is part of moods  Asthma - mild intermittent with seasonal allergies  Flare with spring allergies and winter illness    Office Visit from 03/26/2021 in [facility]   03/26/2021   1430   Asthma History   Symptoms 0-2 days/week  Nighttime Awakenings 0-2/month  Asthma interference with normal activity No limitations  SABA use (not for EIB) 0-2 days/wk  Risk: Exacerbations requiring oral systemic steroids 2 or more / year  Asthma Severity Intermittent      Current Outpatient Medications:    acetaminophen (TYLENOL) 325 MG tablet, Take 650 mg by mouth every 6 (six) hours as needed., Disp: , Rfl:    albuterol (PROVENTIL) (2.5 MG/3ML) 0.083% nebulizer solution, Take 3 mLs (2.5 mg total) by nebulization every 6 (six) hours as needed for wheezing or shortness of breath., Disp: 150 mL, Rfl: 1   albuterol (VENTOLIN HFA) 108 (90 Base) MCG/ACT inhaler, Inhale 2 puffs into the lungs every 6 (six) hours as needed for wheezing or shortness of breath., Disp: 6.7 g, Rfl: 3   ARMOUR THYROID 120 MG tablet, TAKE 1 TABLET BY MOUTH ONCE A DAY BEFOREBREAKFAST, Disp: 90 tablet, Rfl: 0   Ascorbic Acid (VITAMIN C) 1000 MG tablet, Take 1,000  mg by mouth daily., Disp: , Rfl:    citalopram (CELEXA) 20 MG tablet, , Disp: , Rfl:    clonazePAM (KLONOPIN) 0.5 MG tablet, Take 0.5 mg by mouth 3 (three) times daily as needed for anxiety., Disp: , Rfl:    cyclobenzaprine (FLEXERIL) 10 MG tablet, Take 0.5-1 tablets (5-10 mg total) by mouth 3 (three) times daily as needed for muscle spasms. Take mainly at bedtime as may make drowsy, Disp: 30 tablet, Rfl: 1   diclofenac (VOLTAREN) 75 MG EC tablet, TAKE 1 TABLET BY MOUTH ONCE DAILY AS NEEDED FOR MILD TO MODERATE PAIN, Disp: 30 tablet, Rfl: 0   diclofenac Sodium (VOLTAREN) 1 % GEL, Apply 4 g topically 4 (four) times daily., Disp: 2 g, Rfl: 12   DULoxetine (CYMBALTA) 30 MG capsule, Take 30 mg by mouth daily., Disp: , Rfl:    estradiol (VIVELLE-DOT) 0.05 MG/24HR patch, Place 1 patch onto the skin 2 (two) times a week., Disp: , Rfl:    ferrous sulfate 325 (65 FE) MG tablet, Take 325 mg by mouth daily with breakfast., Disp: , Rfl:    fexofenadine (ALLEGRA) 180 MG tablet, Take 1 tablet (180 mg total) by mouth daily., Disp: 90 tablet, Rfl: 1   gabapentin (NEURONTIN) 100 MG capsule, Take 100 mg by mouth 3 (three) times daily., Disp: , Rfl:    metFORMIN (  GLUCOPHAGE) 500 MG tablet, TAKE 1 TABLET BY MOUTH 2 TIMES DAILY WITH A MEAL, Disp: 180 tablet, Rfl: 0   montelukast (SINGULAIR) 10 MG tablet, Take 1 tablet (10 mg total) by mouth at bedtime., Disp: 90 tablet, Rfl: 0   pantoprazole (PROTONIX) 40 MG tablet, Take 40 mg by mouth daily., Disp: , Rfl:    progesterone (PROMETRIUM) 100 MG capsule, progesterone micronized 100 mg capsule  TAKE ONE CAPSULE BY MOUTH EVERY DAY, Disp: , Rfl:    VITAMIN D, CHOLECALCIFEROL, PO, Take 50 mcg by mouth 2 (two) times daily., Disp: , Rfl:    zinc gluconate 50 MG tablet, Take 50 mg by mouth daily., Disp: , Rfl:    zolpidem (AMBIEN) 5 MG tablet, Take 10 mg by mouth at bedtime as needed., Disp: , Rfl:   Patient Active Problem List   Diagnosis Date Noted   Primary osteoarthritis  of left knee 03/19/2021   Tendonitis, Achilles, right 03/19/2021   DDD (degenerative disc disease), cervical 01/13/2021   Asthma 01/13/2021   Scoliosis deformity of spine 01/13/2021   Spinal stenosis of lumbar region 01/13/2021   Current moderate episode of major depressive disorder without prior episode ([place]) 12/20/2019   Scapular dysfunction 12/20/2019   Chronic pain of right knee 12/20/2019   Depression 02/21/2019   Class 3 severe obesity with serious comorbidity and body mass index (BMI) of 50.0 to 59.9 in adult ([place]) 10/20/2018   Mild intermittent asthma with acute exacerbation 05/13/2018   Allergic rhinitis 03/09/2018   Tension headache 03/09/2018   Other fatigue 12/07/2017   Shortness of breath on exertion 12/07/2017   Other specified hypothyroidism 12/07/2017   Prediabetes 10/15/2017   Gastroesophageal reflux disease without esophagitis 10/15/2017   Nasal valve collapse 10/15/2017   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/01/2017   Chronic midline back pain 10/01/2017   Vitamin D deficiency 10/01/2017   Anxiety 10/01/2017   PCOS (polycystic ovarian syndrome) 10/01/2017   Severe episode of recurrent major depressive disorder, without psychotic features ([place]) 10/01/2017   BMI 50.0-59.9, adult ([place]) 10/01/2017   Fibromyalgia 10/01/2017   Postoperative hypothyroidism 10/01/2017   OSA (obstructive sleep apnea) 02/26/2014    Past Surgical History:  Procedure Laterality Date   ABDOMINAL HYSTERECTOMY     CESAREAN SECTION     CHOLECYSTECTOMY     SPINAL FUSION     THYROIDECTOMY     TONSILLECTOMY     TUBAL LIGATION      Family History  Problem Relation Age of Onset   Cancer Maternal Uncle    Heart disease Father    High Cholesterol Father    Heart disease Paternal Grandfather    Breast cancer Maternal Grandmother    Rheum arthritis Mother    Obesity Mother    Rheum arthritis Maternal Grandfather    Rheum arthritis Maternal Aunt     Social History   Tobacco Use    Smoking status: Former    Packs/day: 0.25    Years: 12.00    Pack years: 3.00    Types: Cigarettes    Quit date: 09/07/2004    Years since quitting: 16.5   Smokeless tobacco: Never  Vaping Use   Vaping Use: Never used  Substance Use Topics   Alcohol use: Yes    Comment: 4 times a year   Drug use: No     Allergies  Allergen Reactions   Erythromycin    Erythromycin Base    Zyrtec [Cetirizine Hcl]     Only allergic to Zyrtec  D -     Health Maintenance  Topic Date Due   INFLUENZA VACCINE  04/07/2021   PAP SMEAR-Modifier  06/07/2021   MAMMOGRAM  03/25/2022   TETANUS/TDAP  03/09/2028   COLONOSCOPY (Pts 45-66yrs Insurance coverage will need to be confirmed)  02/06/2031   COVID-19 Vaccine  Completed   Hepatitis C Screening  Completed   HIV Screening  Completed   Pneumococcal Vaccine 60-45 Years old  Aged Out   HPV VACCINES  Aged Out    Chart Review Today: I personally reviewed active problem list, medication list, allergies, family history, social history, health maintenance, notes from last encounter, lab results, imaging with the patient/caregiver today.   Review of Systems  Constitutional: Negative.   HENT: Negative.    Eyes: Negative.   Respiratory: Negative.    Cardiovascular: Negative.   Gastrointestinal: Negative.   Endocrine: Negative.   Genitourinary: Negative.   Musculoskeletal: Negative.   Skin: Negative.   Allergic/Immunologic: Negative.   Neurological: Negative.   Hematological: Negative.   Psychiatric/Behavioral: Negative.    All other systems reviewed and are negative.   Objective:   Vitals:   03/26/21 1339  BP: 126/80  Pulse: 100  Resp: 16  Temp: 97.8 F (36.6 C)  SpO2: 96%  Weight: (!) 303 lb (137.4 kg)  Height: 5\' 1"  (1.549 m)    Body mass index is 57.25 kg/m.  Physical Exam Vitals and nursing note reviewed.  Constitutional:      General: She is not in acute distress.    Appearance: Normal appearance. She is well-developed and  well-groomed. She is morbidly obese. She is not ill-appearing, toxic-appearing or diaphoretic.     Interventions: Face mask in place.  HENT:     Head: Normocephalic and atraumatic.     Right Ear: External ear normal.     Left Ear: External ear normal.  Eyes:     General: No scleral icterus.       Right eye: No discharge.        Left eye: No discharge.     Conjunctiva/sclera: Conjunctivae normal.  Cardiovascular:     Rate and Rhythm: Normal rate and regular rhythm.     Pulses: Normal pulses.     Heart sounds: Normal heart sounds.  Pulmonary:     Effort: Pulmonary effort is normal. No respiratory distress.     Breath sounds: Normal breath sounds. No stridor. No wheezing or rales.  Abdominal:     General: Bowel sounds are normal.     Palpations: Abdomen is soft.  Musculoskeletal:     Right lower leg: Edema present.     Left lower leg: Edema present.  Skin:    General: Skin is warm and dry.     Coloration: Skin is not jaundiced or pale.     Findings: No lesion or rash.  Neurological:     Mental Status: She is alert. Mental status is at baseline.     Gait: Gait abnormal.  Psychiatric:        Attention and Perception: Attention normal.        Mood and Affect: Mood and affect normal. Mood is not anxious. Affect is not tearful.        Speech: Speech normal.        Behavior: Behavior normal. Behavior is cooperative.        Thought Content: Thought content normal. Thought content does not include homicidal or suicidal ideation. Thought content does not include homicidal or suicidal plan.  Cognition and Memory: Cognition normal.        Judgment: Judgment normal.        Assessment & Plan:   1. Other specified hypothyroidism Patient on 120 mcg Armour Thyroid, feeling fatigued and mood slightly depressed, here for refills would like to check labs first and adjust dose if needed and able - TSH - T4, free  2. Allergic rhinitis, unspecified seasonality, unspecified  trigger Symptoms well controlled - montelukast (SINGULAIR) 10 MG tablet; Take 1 tablet (10 mg total) by mouth at bedtime.  Dispense: 90 tablet; Refill: 3 - CBC with Differential/Platelet  3. Mild intermittent asthma without complication Asthma mild and intermittent, well controlled with Singulair and rarely having exacerbations or needing rescue inhaler, has improved since COVID pandemic more people masking and less viral illness for her, seems to have exacerbations with winter season respiratory viruses and with spring allergies both of which have been better controlled over the past 2 years, prior to that she is to have about 3 exacerbations a year - montelukast (SINGULAIR) 10 MG tablet; Take 1 tablet (10 mg total) by mouth at bedtime.  Dispense: 90 tablet; Refill: 3 - CBC with Differential/Platelet  4. Class 3 severe obesity with serious comorbidity and body mass index (BMI) of 50.0 to 59.9 in adult, unspecified obesity type [facility]) Patient has worked with medical weight management and is working with bariatric specialist and planning surgery - CBC with Differential/Platelet - COMPLETE METABOLIC PANEL WITH GFR - Hemoglobin A1c - TSH - Lipid panel - T4, free  5. Current moderate episode of major depressive disorder without prior episode [facility]) She sees psychiatry, PHQ-9 reviewed and positive today, discussed with the patient denies SI/HI We will recheck thyroid levels adjust meds if able, patient encouraged to follow-up with her therapist and psychiatrist - TSH  6. Elevated LFTs Recheck - COMPLETE METABOLIC PANEL WITH GFR  7. Insulin resistance Insulin levels monitored by weight management specialist, will recheck glucose and A1c - COMPLETE METABOLIC PANEL WITH GFR - Hemoglobin A1c  8. Prediabetes On metformin for weight management, Dr. Leafy Ro - COMPLETE METABOLIC PANEL WITH GFR - Hemoglobin A1c  9. Dyslipidemia Low HDL, high triglycerides, recheck labs - COMPLETE METABOLIC  PANEL WITH GFR - Lipid panel  10. Encounter for medication monitoring - CBC with Differential/Platelet - COMPLETE METABOLIC PANEL WITH GFR - Hemoglobin A1c - TSH - Lipid panel - T4, free  11. Vitamin D deficiency Patient supplementing, monitoring calcium levels - COMPLETE METABOLIC PANEL WITH GFR   Return in about 3 months (around 06/26/2021) for thyroid.   Delsa Grana, PA-C 03/26/21 2:05 PM

## 2021-03-27 ENCOUNTER — Other Ambulatory Visit: Payer: Self-pay | Admitting: Family Medicine

## 2021-03-27 DIAGNOSIS — M25512 Pain in left shoulder: Secondary | ICD-10-CM

## 2021-03-27 LAB — COMPLETE METABOLIC PANEL WITH GFR
AG Ratio: 1.4 (calc) (ref 1.0–2.5)
ALT: 48 U/L — ABNORMAL HIGH (ref 6–29)
AST: 32 U/L (ref 10–35)
Albumin: 4.3 g/dL (ref 3.6–5.1)
Alkaline phosphatase (APISO): 77 U/L (ref 31–125)
BUN: 18 mg/dL (ref 7–25)
CO2: 24 mmol/L (ref 20–32)
Calcium: 9.3 mg/dL (ref 8.6–10.2)
Chloride: 103 mmol/L (ref 98–110)
Creat: 0.8 mg/dL (ref 0.50–0.99)
Globulin: 3.1 g/dL (calc) (ref 1.9–3.7)
Glucose, Bld: 147 mg/dL — ABNORMAL HIGH (ref 65–99)
Potassium: 4.3 mmol/L (ref 3.5–5.3)
Sodium: 137 mmol/L (ref 135–146)
Total Bilirubin: 0.6 mg/dL (ref 0.2–1.2)
Total Protein: 7.4 g/dL (ref 6.1–8.1)
eGFR: 92 mL/min/{1.73_m2} (ref >=60)

## 2021-03-27 LAB — HEMOGLOBIN A1C
Hgb A1c MFr Bld: 5.8 % of total Hgb — ABNORMAL HIGH (ref <5.7)
Mean Plasma Glucose: 120 mg/dL
eAG (mmol/L): 6.6 mmol/L

## 2021-03-27 LAB — LIPID PANEL
Cholesterol: 173 mg/dL (ref <200)
HDL: 48 mg/dL — ABNORMAL LOW (ref >=50)
LDL Cholesterol (Calc): 97 mg/dL (calc)
Non-HDL Cholesterol (Calc): 125 mg/dL (calc) (ref <130)
Total CHOL/HDL Ratio: 3.6 (calc) (ref <5.0)
Triglycerides: 181 mg/dL — ABNORMAL HIGH (ref <150)

## 2021-03-27 LAB — CBC WITH DIFFERENTIAL/PLATELET
Absolute Monocytes: 431 cells/uL (ref 200–950)
Basophils Absolute: 39 cells/uL (ref 0–200)
Basophils Relative: 0.5 %
Eosinophils Absolute: 208 cells/uL (ref 15–500)
Eosinophils Relative: 2.7 %
HCT: 46.3 % — ABNORMAL HIGH (ref 35.0–45.0)
Hemoglobin: 15.4 g/dL (ref 11.7–15.5)
Lymphs Abs: 1571 cells/uL (ref 850–3900)
MCH: 29.3 pg (ref 27.0–33.0)
MCHC: 33.3 g/dL (ref 32.0–36.0)
MCV: 88 fL (ref 80.0–100.0)
MPV: 9.6 fL (ref 7.5–12.5)
Monocytes Relative: 5.6 %
Neutro Abs: 5452 cells/uL (ref 1500–7800)
Neutrophils Relative %: 70.8 %
Platelets: 266 10*3/uL (ref 140–400)
RBC: 5.26 10*6/uL — ABNORMAL HIGH (ref 3.80–5.10)
RDW: 12.8 % (ref 11.0–15.0)
Total Lymphocyte: 20.4 %
WBC: 7.7 10*3/uL (ref 3.8–10.8)

## 2021-03-27 LAB — TSH: TSH: 3.3 mIU/L

## 2021-03-27 LAB — HM PAP SMEAR

## 2021-03-27 LAB — T4, FREE: Free T4: 1 ng/dL (ref 0.8–1.8)

## 2021-03-27 MED ORDER — CYCLOBENZAPRINE HCL 10 MG PO TABS: 5.0000 mg | ORAL_TABLET | Freq: Three times a day (TID) | ORAL | 5 refills | Status: DC | PRN

## 2021-03-28 ENCOUNTER — Other Ambulatory Visit: Payer: Self-pay | Admitting: Family Medicine

## 2021-03-28 DIAGNOSIS — E038 Other specified hypothyroidism: Secondary | ICD-10-CM

## 2021-03-28 MED ORDER — ARMOUR THYROID 120 MG PO TABS: 120.0000 mg | ORAL_TABLET | Freq: Every day | ORAL | 0 refills | Status: DC

## 2021-03-28 MED ORDER — ARMOUR THYROID 30 MG PO TABS: ORAL_TABLET | ORAL | 0 refills | Status: DC

## 2021-04-07 DIAGNOSIS — G4733 Obstructive sleep apnea (adult) (pediatric): Secondary | ICD-10-CM | POA: Diagnosis not present

## 2021-04-07 DIAGNOSIS — K449 Diaphragmatic hernia without obstruction or gangrene: Secondary | ICD-10-CM | POA: Diagnosis not present

## 2021-04-07 DIAGNOSIS — M797 Fibromyalgia: Secondary | ICD-10-CM | POA: Diagnosis not present

## 2021-04-07 DIAGNOSIS — Z419 Encounter for procedure for purposes other than remedying health state, unspecified: Secondary | ICD-10-CM | POA: Diagnosis not present

## 2021-04-07 DIAGNOSIS — Z7189 Other specified counseling: Secondary | ICD-10-CM | POA: Diagnosis not present

## 2021-04-07 DIAGNOSIS — Z6841 Body Mass Index (BMI) 40.0 and over, adult: Secondary | ICD-10-CM | POA: Diagnosis not present

## 2021-04-09 DIAGNOSIS — R7303 Prediabetes: Secondary | ICD-10-CM | POA: Diagnosis not present

## 2021-04-09 DIAGNOSIS — M7731 Calcaneal spur, right foot: Secondary | ICD-10-CM | POA: Diagnosis not present

## 2021-04-09 DIAGNOSIS — M79671 Pain in right foot: Secondary | ICD-10-CM | POA: Diagnosis not present

## 2021-04-09 DIAGNOSIS — M7661 Achilles tendinitis, right leg: Secondary | ICD-10-CM | POA: Diagnosis not present

## 2021-04-11 DIAGNOSIS — F331 Major depressive disorder, recurrent, moderate: Secondary | ICD-10-CM | POA: Diagnosis not present

## 2021-04-22 ENCOUNTER — Encounter (INDEPENDENT_AMBULATORY_CARE_PROVIDER_SITE_OTHER): Payer: Self-pay | Admitting: Family Medicine

## 2021-04-24 DIAGNOSIS — F331 Major depressive disorder, recurrent, moderate: Secondary | ICD-10-CM | POA: Diagnosis not present

## 2021-04-28 DIAGNOSIS — J45909 Unspecified asthma, uncomplicated: Secondary | ICD-10-CM | POA: Diagnosis not present

## 2021-04-28 DIAGNOSIS — G4733 Obstructive sleep apnea (adult) (pediatric): Secondary | ICD-10-CM | POA: Diagnosis not present

## 2021-04-28 DIAGNOSIS — M797 Fibromyalgia: Secondary | ICD-10-CM | POA: Diagnosis not present

## 2021-04-28 DIAGNOSIS — E039 Hypothyroidism, unspecified: Secondary | ICD-10-CM | POA: Diagnosis not present

## 2021-04-28 DIAGNOSIS — K219 Gastro-esophageal reflux disease without esophagitis: Secondary | ICD-10-CM | POA: Diagnosis not present

## 2021-04-28 DIAGNOSIS — R7303 Prediabetes: Secondary | ICD-10-CM | POA: Diagnosis not present

## 2021-04-29 DIAGNOSIS — Z20822 Contact with and (suspected) exposure to covid-19: Secondary | ICD-10-CM | POA: Diagnosis not present

## 2021-05-01 DIAGNOSIS — K317 Polyp of stomach and duodenum: Secondary | ICD-10-CM | POA: Diagnosis not present

## 2021-05-01 DIAGNOSIS — K660 Peritoneal adhesions (postprocedural) (postinfection): Secondary | ICD-10-CM | POA: Diagnosis not present

## 2021-05-01 DIAGNOSIS — Z01818 Encounter for other preprocedural examination: Secondary | ICD-10-CM | POA: Diagnosis not present

## 2021-05-01 DIAGNOSIS — K219 Gastro-esophageal reflux disease without esophagitis: Secondary | ICD-10-CM | POA: Diagnosis not present

## 2021-05-01 DIAGNOSIS — G4733 Obstructive sleep apnea (adult) (pediatric): Secondary | ICD-10-CM | POA: Diagnosis not present

## 2021-05-01 DIAGNOSIS — K449 Diaphragmatic hernia without obstruction or gangrene: Secondary | ICD-10-CM | POA: Diagnosis not present

## 2021-05-01 DIAGNOSIS — R7303 Prediabetes: Secondary | ICD-10-CM | POA: Diagnosis not present

## 2021-05-01 DIAGNOSIS — M797 Fibromyalgia: Secondary | ICD-10-CM | POA: Diagnosis not present

## 2021-05-01 DIAGNOSIS — Z9884 Bariatric surgery status: Secondary | ICD-10-CM | POA: Insufficient documentation

## 2021-05-01 DIAGNOSIS — Z981 Arthrodesis status: Secondary | ICD-10-CM | POA: Diagnosis not present

## 2021-05-01 DIAGNOSIS — J4520 Mild intermittent asthma, uncomplicated: Secondary | ICD-10-CM | POA: Diagnosis not present

## 2021-05-01 DIAGNOSIS — Z90710 Acquired absence of both cervix and uterus: Secondary | ICD-10-CM | POA: Diagnosis not present

## 2021-05-01 DIAGNOSIS — Z9989 Dependence on other enabling machines and devices: Secondary | ICD-10-CM | POA: Diagnosis not present

## 2021-05-01 DIAGNOSIS — Z6841 Body Mass Index (BMI) 40.0 and over, adult: Secondary | ICD-10-CM | POA: Diagnosis not present

## 2021-05-02 DIAGNOSIS — Z9884 Bariatric surgery status: Secondary | ICD-10-CM | POA: Diagnosis not present

## 2021-05-05 ENCOUNTER — Other Ambulatory Visit: Payer: Self-pay

## 2021-05-05 ENCOUNTER — Telehealth: Payer: Self-pay | Admitting: *Deleted

## 2021-05-05 ENCOUNTER — Encounter (INDEPENDENT_AMBULATORY_CARE_PROVIDER_SITE_OTHER): Payer: Self-pay | Admitting: Family Medicine

## 2021-05-05 ENCOUNTER — Telehealth (INDEPENDENT_AMBULATORY_CARE_PROVIDER_SITE_OTHER): Payer: Medicaid Other | Admitting: Family Medicine

## 2021-05-05 DIAGNOSIS — Z6841 Body Mass Index (BMI) 40.0 and over, adult: Secondary | ICD-10-CM | POA: Diagnosis not present

## 2021-05-05 DIAGNOSIS — R7303 Prediabetes: Secondary | ICD-10-CM | POA: Diagnosis not present

## 2021-05-05 NOTE — Progress Notes (Signed)
TeleHealth Visit:  Due to the COVID-19 pandemic, this visit was completed with telemedicine (audio/video) technology to reduce patient and provider exposure as well as to preserve personal protective equipment.   [name] has verbally consented to this TeleHealth visit. The patient is located at home, the provider is located at the [facility] office. The participants in this visit include the listed provider and patient. The visit was conducted today via MyChart video.   Chief Complaint: OBESITY [name] is here to discuss her progress with her obesity treatment plan along with follow-up of her obesity related diagnoses. [name] is on practicing portion control and making smarter food choices, such as increasing vegetables and decreasing simple carbohydrates and states she is following her eating plan approximately 100% of the time. [name] states she is walking for 60 minutes.  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 She is on full liquids for the first week and walking every 15-20 minutes or so. She feels bloated still due to the surgical insufflation.   Subjective:   1. Pre-diabetes [name] is off metformin due to having weight loss surgery. She is on full liquids and she is working on protein drinks and hydration. She has no signs of dumping syndrome at this time.  Assessment/Plan:   1. Pre-diabetes [name] will continue to work on meeting her protein goals and decreasing simple carbohydrates to prevent dumping. We will recheck labs in 3 months to see if she is doing well without metformin.  2. Obesity with current BMI 56.8 [name] is currently in the action stage of change. As such, her goal is to continue with weight loss efforts. She has agreed to practicing portion control and making smarter food choices, such as increasing vegetables and decreasing simple carbohydrates.    [name] is to continue as is and advance her diet as her surgery team recommends. We will follow up in 3 months when she is back on solid foods.  Exercise goals: As is.  Behavioral modification strategies: increasing water intake.  [name] has agreed to follow-up with our clinic in 12 weeks. She was informed of the importance of frequent follow-up visits to maximize her success with intensive lifestyle modifications for her multiple health conditions.  Objective:   VITALS: Per patient if applicable, see vitals. GENERAL: Alert and in no acute distress. CARDIOPULMONARY: No increased WOB. Speaking in clear sentences.  PSYCH: Pleasant and cooperative. Speech normal rate and rhythm. Affect is appropriate. Insight and judgement are appropriate. Attention is focused, linear, and appropriate.  NEURO: Oriented as arrived to appointment on time with no prompting.   Lab Results  Component Value Date   CREATININE 0.80 03/26/2021   BUN 18 03/26/2021   NA 137 03/26/2021   K 4.3 03/26/2021   CL 103 03/26/2021   CO2 24 03/26/2021   Lab Results  Component Value Date   ALT 48 (H) 03/26/2021   AST 32 03/26/2021   ALKPHOS 80 07/22/2020   BILITOT 0.6 03/26/2021   Lab Results  Component Value Date   HGBA1C 5.8 (H) 03/26/2021   HGBA1C 5.7 (H) 07/22/2020   HGBA1C 5.7 (H) 12/07/2019   HGBA1C 5.8 (H) 08/07/2019   HGBA1C 5.6 08/29/2018   Lab Results  Component Value Date   INSULIN 33.2 (H) 07/22/2020   INSULIN 24.7 12/07/2019   INSULIN 26.1 (H) 08/07/2019   INSULIN 29.5 (H) 08/29/2018   INSULIN 46.1 (H)  04/27/2018   Lab Results  Component Value Date   TSH 3.30 03/26/2021   Lab Results  Component Value Date   CHOL 173 03/26/2021   HDL 48 (L) 03/26/2021   LDLCALC 97 03/26/2021   TRIG 181 (H) 03/26/2021   CHOLHDL 3.6 03/26/2021   Lab Results  Component Value Date   VD25OH 50.4 07/22/2020   VD25OH 49.1 12/07/2019   VD25OH 61.5 08/07/2019   Lab Results  Component Value Date   WBC  7.7 03/26/2021   HGB 15.4 03/26/2021   HCT 46.3 (H) 03/26/2021   MCV 88.0 03/26/2021   PLT 266 03/26/2021   No results found for: IRON, TIBC, FERRITIN  Attestation Statements:   Reviewed by clinician on day of visit: allergies, medications, problem list, medical history, surgical history, family history, social history, and previous encounter notes.   I, Trixie Dredge, am acting as transcriptionist for Dennard Nip, MD.  I have reviewed the above documentation for accuracy and completeness, and I agree with the above. - Dennard Nip, MD

## 2021-05-05 NOTE — Telephone Encounter (Signed)
Transition Care Management Unsuccessful Follow-up Telephone Call  Date of discharge and from where:  05/03/2021 [facility]  Attempts:  1st Attempt  Reason for unsuccessful TCM follow-up call:  No answer/busy

## 2021-05-06 NOTE — Telephone Encounter (Signed)
Transition Care Management Follow-up Telephone Call Date of discharge and from where: 05/03/2021 [facility] How have you been since you were released from the hospital? "Doing good" Any questions or concerns? No  Items Reviewed: Did the pt receive and understand the discharge instructions provided? Yes  Medications obtained and verified? Yes  Other? No  Any new allergies since your discharge? No  Dietary orders reviewed? No Do you have support at home? Yes    Functional Questionnaire: (I = Independent and D = Dependent) ADLs: I  Bathing/Dressing- I  Meal Prep- I  Eating- I  Maintaining continence- I  Transferring/Ambulation- I  Managing Meds- I  Follow up appointments reviewed:  PCP Hospital f/u appt confirmed? No  - has routine appointment in October Specialist Hospital f/u appt confirmed? No   Are transportation arrangements needed? No  If their condition worsens, is the pt aware to call PCP or go to the Emergency Dept.? Yes Was the patient provided with contact information for the PCP's office or ED? Yes Was to pt encouraged to call back with questions or concerns? Yes

## 2021-05-08 DIAGNOSIS — Z419 Encounter for procedure for purposes other than remedying health state, unspecified: Secondary | ICD-10-CM | POA: Diagnosis not present

## 2021-05-15 DIAGNOSIS — F331 Major depressive disorder, recurrent, moderate: Secondary | ICD-10-CM | POA: Diagnosis not present

## 2021-05-21 DIAGNOSIS — F411 Generalized anxiety disorder: Secondary | ICD-10-CM | POA: Diagnosis not present

## 2021-05-21 DIAGNOSIS — F331 Major depressive disorder, recurrent, moderate: Secondary | ICD-10-CM | POA: Diagnosis not present

## 2021-05-26 ENCOUNTER — Encounter: Payer: Self-pay | Admitting: Nurse Practitioner

## 2021-05-26 ENCOUNTER — Other Ambulatory Visit: Payer: Self-pay

## 2021-05-26 ENCOUNTER — Ambulatory Visit: Payer: Medicaid Other | Admitting: Nurse Practitioner

## 2021-05-26 VITALS — BP 128/82 | HR 95 | Temp 98.1°F | Resp 16 | Ht 61.0 in | Wt 273.0 lb

## 2021-05-26 DIAGNOSIS — R5383 Other fatigue: Secondary | ICD-10-CM | POA: Diagnosis not present

## 2021-05-26 DIAGNOSIS — E038 Other specified hypothyroidism: Secondary | ICD-10-CM

## 2021-05-26 DIAGNOSIS — R599 Enlarged lymph nodes, unspecified: Secondary | ICD-10-CM

## 2021-05-26 NOTE — Progress Notes (Signed)
   BP 128/82   Pulse 95   Temp 98.1 F (36.7 C)   Resp 16   Ht 5\' 1"  (1.549 m)   Wt 273 lb (123.8 kg)   SpO2 96%   BMI 51.58 kg/m    Subjective:    Patient ID: [name], female    DOB: [birth year], 45 y.o.   MRN: [id number]  HPI: [name] is a 45 y.o. female, here alone  Chief Complaint  Patient presents with   Adenopathy    X3 days, better today   Adenopathy:  She reports that on Saturday she noticed that the right side of her neck she could feel her lymph nodes and that they were tender to the touch.  She denies any fever, cough, sore throat or ear pain.  She says that the swelling has gone down, but that she was still worried.  Weight loss surgery 8/25.  She says she has been getting her fluids in but is still struggling to eat.  Reassurance given to patient that her lymph nodes are not currently swollen.  Will check CBC.  Hypothyroidism / Fatigue: She is currently taking Armour Thyroid 150 mg daily.  That was recently increased, she thinks that her fatigue may be associated with that.  She denies any cold/heat sensitivity or dry skin.  She denies any chest pain or shortness of breath.  She does say that every once in awhile she feels a palpitation.  She was tachycardic upon arrival at a rate of 115, rechecked heart rate 95.  Will recheck TSH.   Relevant past medical, surgical, family and social history reviewed and updated as indicated. Interim medical history since our last visit reviewed. Allergies and medications reviewed and updated.  Review of Systems  Constitutional: Negative for fever, positive weight change.  Respiratory: Negative for cough and shortness of breath.   Cardiovascular: Negative for chest pain, positive for palpitations.  Gastrointestinal: Negative for abdominal pain, no bowel changes.  Musculoskeletal: Negative for gait problem or joint swelling.  Skin: Negative for rash.  Neurological: Negative for dizziness or headache.  No other specific  complaints in a complete review of systems (except as listed in HPI above).      Objective:    BP 128/82   Pulse 95   Temp 98.1 F (36.7 C)   Resp 16   Ht 5\' 1"  (1.549 m)   Wt 273 lb (123.8 kg)   SpO2 96%   BMI 51.58 kg/m   Wt Readings from Last 3 Encounters:  05/26/21 273 lb (123.8 kg)  03/26/21 (!) 303 lb (137.4 kg)  03/19/21 (!) 304 lb (137.9 kg)    Physical Exam  Constitutional: Patient appears well-developed and well-nourished. Obese No distress.  HEENT: head atraumatic, normocephalic, pupils equal and reactive to light, ears TM membranes clear and intact, neck supple, throat within normal limits Cardiovascular: Normal rate, regular rhythm and normal heart sounds.  No murmur heard. No BLE edema. Pulmonary/Chest: Effort normal and breath sounds normal. No respiratory distress. Abdominal: Soft.  There is no tenderness. Psychiatric: Patient has a normal mood and affect. behavior is normal. Judgment and thought content normal.     Assessment & Plan:   1. Adenopathy  - CBC with Differential/Platelet  2. Fatigue, unspecified type  - TSH   Follow up plan: Return if symptoms worsen or fail to improve.

## 2021-05-27 LAB — CBC WITH DIFFERENTIAL/PLATELET
Absolute Monocytes: 659 cells/uL (ref 200–950)
Basophils Absolute: 32 cells/uL (ref 0–200)
Basophils Relative: 0.5 %
Eosinophils Absolute: 160 cells/uL (ref 15–500)
Eosinophils Relative: 2.5 %
HCT: 44 % (ref 35.0–45.0)
Hemoglobin: 14.8 g/dL (ref 11.7–15.5)
Lymphs Abs: 1242 cells/uL (ref 850–3900)
MCH: 29.1 pg (ref 27.0–33.0)
MCHC: 33.6 g/dL (ref 32.0–36.0)
MCV: 86.4 fL (ref 80.0–100.0)
MPV: 11.3 fL (ref 7.5–12.5)
Monocytes Relative: 10.3 %
Neutro Abs: 4307 cells/uL (ref 1500–7800)
Neutrophils Relative %: 67.3 %
Platelets: 209 10*3/uL (ref 140–400)
RBC: 5.09 10*6/uL (ref 3.80–5.10)
RDW: 12.1 % (ref 11.0–15.0)
Total Lymphocyte: 19.4 %
WBC: 6.4 10*3/uL (ref 3.8–10.8)

## 2021-05-27 LAB — TSH: TSH: 0.06 mIU/L — ABNORMAL LOW

## 2021-05-29 DIAGNOSIS — F331 Major depressive disorder, recurrent, moderate: Secondary | ICD-10-CM | POA: Diagnosis not present

## 2021-06-02 ENCOUNTER — Ambulatory Visit: Payer: Medicaid Other | Admitting: Family Medicine

## 2021-06-07 DIAGNOSIS — Z419 Encounter for procedure for purposes other than remedying health state, unspecified: Secondary | ICD-10-CM | POA: Diagnosis not present

## 2021-06-09 ENCOUNTER — Encounter: Payer: Self-pay | Admitting: Family Medicine

## 2021-06-09 ENCOUNTER — Telehealth (INDEPENDENT_AMBULATORY_CARE_PROVIDER_SITE_OTHER): Payer: Medicaid Other | Admitting: Family Medicine

## 2021-06-09 ENCOUNTER — Other Ambulatory Visit: Payer: Self-pay

## 2021-06-09 VITALS — Temp 98.0°F | Ht 61.0 in | Wt 260.0 lb

## 2021-06-09 DIAGNOSIS — U071 COVID-19: Secondary | ICD-10-CM | POA: Diagnosis not present

## 2021-06-09 MED ORDER — MOLNUPIRAVIR EUA 200MG CAPSULE
4.0000 | ORAL_CAPSULE | Freq: Two times a day (BID) | ORAL | 0 refills | Status: AC
  Filled 2021-06-09: qty 40, 5d supply, fill #0

## 2021-06-09 NOTE — Patient Instructions (Addendum)
It was great to see you!  Our plans for today:  - See below for self-isolation guidelines. You may end your quarantine once you are 5 days from symptom onset and fever free for 24 hours without use of tylenol or ibuprofen. Wear a well-fitting N95 mask for an additional 5 days. - Take the molnupiravir as directed. See RunningShows.fr for more information about the medication. - You can take an oral decongestant for your symptoms as well.  - I recommend getting vaccinated with booster once you are healed from your current infection. - Certainly, if you are having difficulties breathing or unable to keep down fluids, go to the Emergency Department.   Take care and seek immediate care sooner if you develop any concerns.   Dr. Ky Barban     Person Under Monitoring Name: [name]  Location: [address] [place] [address]   Infection Prevention Recommendations for Individuals Confirmed to have, or Being Evaluated for, 2019 Novel Coronavirus (COVID-19) Infection Who Receive Care at Home  Individuals who are confirmed to have, or are being evaluated for, COVID-19 should follow the prevention steps below until a healthcare provider or local or state health department says they can return to normal activities.  Stay home except to get medical care You should restrict activities outside your home, except for getting medical care. Do not go to work, school, or public areas, and do not use public transportation or taxis.  Call ahead before visiting your doctor Before your medical appointment, call the healthcare provider and tell them that you have, or are being evaluated for, COVID-19 infection. This will help the healthcare provider's office take steps to keep other people from getting infected. Ask your healthcare provider to call the local or state health department.  Monitor your symptoms Seek prompt medical attention if your illness is worsening  (e.g., difficulty breathing). Before going to your medical appointment, call the healthcare provider and tell them that you have, or are being evaluated for, COVID-19 infection. Ask your healthcare provider to call the local or state health department.  Wear a facemask You should wear a facemask that covers your nose and mouth when you are in the same room with other people and when you visit a healthcare provider. People who live with or visit you should also wear a facemask while they are in the same room with you.  Separate yourself from other people in your home As much as possible, you should stay in a different room from other people in your home. Also, you should use a separate bathroom, if available.  Avoid sharing household items You should not share dishes, drinking glasses, cups, eating utensils, towels, bedding, or other items with other people in your home. After using these items, you should wash them thoroughly with soap and water.  Cover your coughs and sneezes Cover your mouth and nose with a tissue when you cough or sneeze, or you can cough or sneeze into your sleeve. Throw used tissues in a lined trash can, and immediately wash your hands with soap and water for at least 20 seconds or use an alcohol-based hand rub.  Wash your [facility] your hands often and thoroughly with soap and water for at least 20 seconds. You can use an alcohol-based hand sanitizer if soap and water are not available and if your hands are not visibly dirty. Avoid touching your eyes, nose, and mouth with unwashed hands.   Prevention Steps for Caregivers and Household Members of  Individuals Confirmed to have, or Being Evaluated for, COVID-19 Infection Being Cared for in the Home  If you live with, or provide care at home for, a person confirmed to have, or being evaluated for, COVID-19 infection please follow these guidelines to prevent infection:  Follow healthcare provider's  instructions Make sure that you understand and can help the patient follow any healthcare provider instructions for all care.  Provide for the patient's basic needs You should help the patient with basic needs in the home and provide support for getting groceries, prescriptions, and other personal needs.  Monitor the patient's symptoms If they are getting sicker, call his or her medical provider and tell them that the patient has, or is being evaluated for, COVID-19 infection. This will help the healthcare provider's office take steps to keep other people from getting infected. Ask the healthcare provider to call the local or state health department.  Limit the number of people who have contact with the patient If possible, have only one caregiver for the patient. Other household members should stay in another home or place of residence. If this is not possible, they should stay in another room, or be separated from the patient as much as possible. Use a separate bathroom, if available. Restrict visitors who do not have an essential need to be in the home.  Keep older adults, very young children, and other sick people away from the patient Keep older adults, very young children, and those who have compromised immune systems or chronic health conditions away from the patient. This includes people with chronic heart, lung, or kidney conditions, diabetes, and cancer.  Ensure good ventilation Make sure that shared spaces in the home have good air flow, such as from an air conditioner or an opened window, weather permitting.  Wash your hands often Wash your hands often and thoroughly with soap and water for at least 20 seconds. You can use an alcohol based hand sanitizer if soap and water are not available and if your hands are not visibly dirty. Avoid touching your eyes, nose, and mouth with unwashed hands. Use disposable paper towels to dry your hands. If not available, use dedicated cloth  towels and replace them when they become wet.  Wear a facemask and gloves Wear a disposable facemask at all times in the room and gloves when you touch or have contact with the patient's blood, body fluids, and/or secretions or excretions, such as sweat, saliva, sputum, nasal mucus, vomit, urine, or feces.  Ensure the mask fits over your nose and mouth tightly, and do not touch it during use. Throw out disposable facemasks and gloves after using them. Do not reuse. Wash your hands immediately after removing your facemask and gloves. If your personal clothing becomes contaminated, carefully remove clothing and launder. Wash your hands after handling contaminated clothing. Place all used disposable facemasks, gloves, and other waste in a lined container before disposing them with other household waste. Remove gloves and wash your hands immediately after handling these items.  Do not share dishes, glasses, or other household items with the patient Avoid sharing household items. You should not share dishes, drinking glasses, cups, eating utensils, towels, bedding, or other items with a patient who is confirmed to have, or being evaluated for, COVID-19 infection. After the person uses these items, you should wash them thoroughly with soap and water.  Wash laundry thoroughly Immediately remove and wash clothes or bedding that have blood, body fluids, and/or secretions or excretions, such as  sweat, saliva, sputum, nasal mucus, vomit, urine, or feces, on them. Wear gloves when handling laundry from the patient. Read and follow directions on labels of laundry or clothing items and detergent. In general, wash and dry with the warmest temperatures recommended on the label.  Clean all areas the individual has used often Clean all touchable surfaces, such as counters, tabletops, doorknobs, bathroom fixtures, toilets, phones, keyboards, tablets, and bedside tables, every day. Also, clean any surfaces that may  have blood, body fluids, and/or secretions or excretions on them. Wear gloves when cleaning surfaces the patient has come in contact with. Use a diluted bleach solution (e.g., dilute bleach with 1 part bleach and 10 parts water) or a household disinfectant with a label that says EPA-registered for coronaviruses. To make a bleach solution at home, add 1 tablespoon of bleach to 1 quart (4 cups) of water. For a larger supply, add  cup of bleach to 1 gallon (16 cups) of water. Read labels of cleaning products and follow recommendations provided on product labels. Labels contain instructions for safe and effective use of the cleaning product including precautions you should take when applying the product, such as wearing gloves or eye protection and making sure you have good ventilation during use of the product. Remove gloves and wash hands immediately after cleaning.  Monitor yourself for signs and symptoms of illness Caregivers and household members are considered close contacts, should monitor their health, and will be asked to limit movement outside of the home to the extent possible. Follow the monitoring steps for close contacts listed on the symptom monitoring form.   ? If you have additional questions, contact your local health department or call the epidemiologist on call at [phone] (available 24/7). ? This guidance is subject to change. For the most up-to-date guidance from [facility], please refer to their website: [email or web address].pl

## 2021-06-09 NOTE — Progress Notes (Addendum)
Virtual Visit via Video Note  I connected with [name] on 06/09/21 at  3:40 PM EDT by a video enabled telemedicine application and verified that I am speaking with the correct person using two identifiers.  Location: Patient: home Provider: [facility]   I discussed the limitations of evaluation and management by telemedicine and the availability of in person appointments. The patient expressed understanding and agreed to proceed.  History of Present Illness:  COVID INFECTION - symptom onset 9/29 - has received 3 doses of mRNA vaccine. - COVID+ 06/09/21 - husband also with COVID  Fever: no Cough: yes, productive of phlegm Shortness of breath: no Wheezing: no Chest pain: no Chest tightness: yes, relieved with albuterol Chest congestion: no Nasal congestion: yes Sore throat: yes Sinus pressure: yes Headache: yes Ear pain: no  Ear pressure: yes  L>R Vomiting: no Rash: no Sick contacts: yes Context: worse Recurrent sinusitis: no Treatments attempted: liquid tylenol   Observations/Objective:  Tired appearing, in NAD. Congested sounding.   Assessment and Plan:  [id number] Doing well with moderate sx. Is a candidate for COVID treatment, molnupiravir sent to pharmacy. Reviewed OTC symptom relief, self-quarantine guidelines, and emergency precautions.     I discussed the assessment and treatment plan with the patient. The patient was provided an opportunity to ask questions and all were answered. The patient agreed with the plan and demonstrated an understanding of the instructions.   The patient was advised to call back or seek an in-person evaluation if the symptoms worsen or if the condition fails to improve as anticipated.  I provided 18 minutes of non-face-to-face time during this encounter.  ADDENDUM: Spoke with pharmacist. Both paxlovid and molnupiravir are unable to be crushed and therefore patient cannot take due to recent bariatric surgery. LVM regarding information.  Can take oral decongestant for symptoms.   Myles Gip, DO

## 2021-06-13 DIAGNOSIS — Z713 Dietary counseling and surveillance: Secondary | ICD-10-CM | POA: Diagnosis not present

## 2021-06-13 DIAGNOSIS — Z9884 Bariatric surgery status: Secondary | ICD-10-CM | POA: Diagnosis not present

## 2021-06-16 ENCOUNTER — Encounter: Payer: Self-pay | Admitting: Family Medicine

## 2021-06-16 ENCOUNTER — Telehealth (INDEPENDENT_AMBULATORY_CARE_PROVIDER_SITE_OTHER): Payer: Medicaid Other | Admitting: Family Medicine

## 2021-06-16 ENCOUNTER — Telehealth: Payer: Medicaid Other | Admitting: Nurse Practitioner

## 2021-06-16 VITALS — HR 99

## 2021-06-16 DIAGNOSIS — R1310 Dysphagia, unspecified: Secondary | ICD-10-CM

## 2021-06-16 DIAGNOSIS — E038 Other specified hypothyroidism: Secondary | ICD-10-CM | POA: Diagnosis not present

## 2021-06-16 DIAGNOSIS — K219 Gastro-esophageal reflux disease without esophagitis: Secondary | ICD-10-CM

## 2021-06-16 DIAGNOSIS — J069 Acute upper respiratory infection, unspecified: Secondary | ICD-10-CM

## 2021-06-16 DIAGNOSIS — U071 COVID-19: Secondary | ICD-10-CM

## 2021-06-16 DIAGNOSIS — R202 Paresthesia of skin: Secondary | ICD-10-CM

## 2021-06-16 DIAGNOSIS — J45901 Unspecified asthma with (acute) exacerbation: Secondary | ICD-10-CM

## 2021-06-16 MED ORDER — PREDNISOLONE 15 MG/5ML PO SOLN: 45.0000 mg | Freq: Every day | ORAL | 0 refills | Status: AC

## 2021-06-16 MED ORDER — IPRATROPIUM-ALBUTEROL 0.5-2.5 (3) MG/3ML IN SOLN: 3.0000 mL | Freq: Four times a day (QID) | RESPIRATORY_TRACT | 0 refills | Status: DC | PRN

## 2021-06-16 MED ORDER — ARMOUR THYROID 120 MG PO TABS: 120.0000 mg | ORAL_TABLET | Freq: Every day | ORAL | 3 refills | Status: DC

## 2021-06-16 NOTE — Progress Notes (Signed)
Name: [name]   MRN: [id number]    DOB: 07/08/1976   Date:06/16/2021       Progress Note  Subjective:    Chief Complaint  Chief Complaint  Patient presents with   Cough    Cough has increased.   Generalized Body Aches   Covid Positive    Tested last week on 10/03   Tingling    Both hands and toes    I connected with  [name]  on 06/16/21 at 11:00 AM EDT by a video enabled telemedicine application and verified that I am speaking with the correct person using two identifiers.  I discussed the limitations of evaluation and management by telemedicine and the availability of in person appointments. The patient expressed understanding and agreed to proceed. Staff also discussed with the patient that there may be a patient responsible charge related to this service. Patient Location: home Provider Location: [facility] clinic Additional Individuals present: none  Cough Associated symptoms include wheezing. Pertinent negatives include no chest pain, chills or fever.  Sick and covid positive, sx started on 10/1 or 10/2, test positive 10/3 a week ago She did a video visit done 10/3  Unable to do  Cough wheeze, chest tightness, body aches still present but not as bad Cough sometimes productive, sputum has been green, most of the time cough is frequent, tight, dry with associated wheeze/tightness,  no fever (since onset of illness), hematemesis, CP, exertional dyspnea, palpitations, near syncope Numbness and tingling in all extremities onset 3 days ago comes and goes  Pulse ox at home 96% last night,  Today HR 99, and SpO2 96%  Did not take BP Notes loosing a lot of weight  She is using Rescue inhaler using a few times a day - helps a little, not doing neb tx Used robitussin cough and congestion DM- helped but she ran out  Recent gastric bypass trouble with abd pain, swallowing, feels like food is not passing pain to "where her gallbladder would be if she had one" several episodes  of this, before and after having covid, she has reached out to bariatric surgery team about it - they said she may need upper endoscopy   Tingling - coming and going x 3 days No fever, but night sweats  Hypothyroid - previously increased dose from 120 to 150 mcg armour with fatigue and prior elevated TSH, when rechecked TSH was then abnormally low and she was instructed to decrease dose, she states she also lost 30 lbs during that time of med adjustment and she felt like her HR was too fast so she decreased back to 120 mcg daily  Lab Results  Component Value Date   TSH 0.06 (L) 05/26/2021       Patient Active Problem List   Diagnosis Date Noted   S/P gastric bypass 05/01/2021   Primary osteoarthritis of left knee 03/19/2021   Tendonitis, Achilles, right 03/19/2021   DDD (degenerative disc disease), cervical 01/13/2021   Asthma 01/13/2021   Scoliosis deformity of spine 01/13/2021   Spinal stenosis of lumbar region 01/13/2021   Current moderate episode of major depressive disorder without prior episode ([place]) 12/20/2019   Scapular dysfunction 12/20/2019   Chronic pain of right knee 12/20/2019   Depression 02/21/2019   Class 3 severe obesity with serious comorbidity and body mass index (BMI) of 50.0 to 59.9 in adult ([place]) 10/20/2018   Mild intermittent asthma with acute exacerbation 05/13/2018   Allergic rhinitis 03/09/2018  Tension headache 03/09/2018   Other fatigue 12/07/2017   Shortness of breath on exertion 12/07/2017   Other specified hypothyroidism 12/07/2017   Prediabetes 10/15/2017   Gastroesophageal reflux disease without esophagitis 10/15/2017   Nasal valve collapse 10/15/2017   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/01/2017   Chronic midline back pain 10/01/2017   Vitamin D deficiency 10/01/2017   Anxiety 10/01/2017   PCOS (polycystic ovarian syndrome) 10/01/2017   Severe episode of recurrent major depressive disorder, without psychotic features ([place]) 10/01/2017    BMI 50.0-59.9, adult ([place]) 10/01/2017   Fibromyalgia 10/01/2017   Postoperative hypothyroidism 10/01/2017   OSA (obstructive sleep apnea) 02/26/2014    Social History   Tobacco Use   Smoking status: Former    Packs/day: 0.25    Years: 12.00    Pack years: 3.00    Types: Cigarettes    Quit date: 09/07/2004    Years since quitting: 16.7   Smokeless tobacco: Never  Substance Use Topics   Alcohol use: Yes    Comment: 4 times a year     Current Outpatient Medications:    acetaminophen (TYLENOL) 325 MG tablet, Take 650 mg by mouth every 6 (six) hours as needed., Disp: , Rfl:    albuterol (PROVENTIL) (2.5 MG/3ML) 0.083% nebulizer solution, Take 3 mLs (2.5 mg total) by nebulization every 6 (six) hours as needed for wheezing or shortness of breath., Disp: 150 mL, Rfl: 1   albuterol (VENTOLIN HFA) 108 (90 Base) MCG/ACT inhaler, Inhale 2 puffs into the lungs every 6 (six) hours as needed for wheezing or shortness of breath., Disp: 6.7 g, Rfl: 3   ARMOUR THYROID 120 MG tablet, Take 1 tablet (120 mg total) by mouth daily before breakfast. Take with 30 mg tab dose for total 150 mg dose po daily (Patient taking differently: Take 120 mg by mouth daily before breakfast. Take with 30 mg tab dose for total 150 mg dose po daily Pt only taking 144mcg daily), Disp: 90 tablet, Rfl: 0   ARMOUR THYROID 30 MG tablet, Take 30 mg tab with 120 mg tab po daily before breakfast for total daily dose 150 mg for hypothyroid, Disp: 90 tablet, Rfl: 0   clonazePAM (KLONOPIN) 0.5 MG tablet, Take 0.5 mg by mouth 3 (three) times daily as needed for anxiety., Disp: , Rfl:    cyclobenzaprine (FLEXERIL) 10 MG tablet, Take 0.5-1 tablets (5-10 mg total) by mouth 3 (three) times daily as needed for muscle spasms. Take mainly at bedtime as may make drowsy, Disp: 30 tablet, Rfl: 5   diclofenac (VOLTAREN) 75 MG EC tablet, TAKE 1 TABLET BY MOUTH ONCE DAILY AS NEEDED FOR MILD TO MODERATE PAIN, Disp: 30 tablet, Rfl: 0   DULoxetine  (CYMBALTA) 30 MG capsule, Take 30 mg by mouth daily., Disp: , Rfl:    estradiol (VIVELLE-DOT) 0.05 MG/24HR patch, Place 1 patch onto the skin 2 (two) times a week., Disp: , Rfl:    fexofenadine (ALLEGRA) 180 MG tablet, Take 1 tablet (180 mg total) by mouth daily., Disp: 90 tablet, Rfl: 1   gabapentin (NEURONTIN) 100 MG capsule, Take 100 mg by mouth 3 (three) times daily., Disp: , Rfl:    montelukast (SINGULAIR) 10 MG tablet, Take 1 tablet (10 mg total) by mouth at bedtime., Disp: 90 tablet, Rfl: 3   Multiple Vitamins-Minerals (MULTIVITAMIN WITH MINERALS) tablet, Take 1 tablet by mouth daily., Disp: , Rfl:    omeprazole (PRILOSEC) 20 MG capsule, Take 20 mg by mouth daily., Disp: , Rfl:  pantoprazole (PROTONIX) 40 MG tablet, Take 40 mg by mouth daily., Disp: , Rfl:    progesterone (PROMETRIUM) 100 MG capsule, progesterone micronized 100 mg capsule  TAKE ONE CAPSULE BY MOUTH EVERY DAY, Disp: , Rfl:    VITAMIN D, CHOLECALCIFEROL, PO, Take 50 mcg by mouth 2 (two) times daily., Disp: , Rfl:    zolpidem (AMBIEN) 5 MG tablet, Take 10 mg by mouth at bedtime as needed., Disp: , Rfl:   Allergies  Allergen Reactions   Erythromycin    Erythromycin Base    Zyrtec [Cetirizine Hcl]     Only allergic to Zyrtec D -     Chart Review: I personally reviewed active problem list, medication list, allergies, family history, social history, health maintenance, notes from last encounter, lab results, imaging with the patient/caregiver today.   Review of Systems  Constitutional:  Positive for diaphoresis. Negative for activity change, appetite change, chills, fever and unexpected weight change.  HENT: Negative.    Eyes: Negative.   Respiratory:  Positive for cough, chest tightness and wheezing.   Cardiovascular: Negative.  Negative for chest pain, palpitations and leg swelling.  Gastrointestinal: Negative.   Endocrine: Negative.   Genitourinary: Negative.   Musculoskeletal: Negative.   Skin: Negative.    Allergic/Immunologic: Negative.   Neurological: Negative.   Hematological: Negative.   Psychiatric/Behavioral: Negative.    All other systems reviewed and are negative.    Objective:   Virtual encounter, vitals limited, only able to obtain the following Today's Vitals   06/16/21 1159  Pulse: 99  SpO2: 96%   There is no height or weight on file to calculate BMI. Nursing Note and Vital Signs reviewed.  Physical Exam Vitals and nursing note reviewed.  Constitutional:      General: She is not in acute distress.    Appearance: She is well-developed and well-groomed. She is obese. She is not ill-appearing, toxic-appearing or diaphoretic.  Cardiovascular:     Rate and Rhythm: Normal rate.  Pulmonary:     Effort: Pulmonary effort is normal. No tachypnea, accessory muscle usage, respiratory distress or retractions.     Comments: No audible wheeze or stridor, frequent coughing and coughing fits, able to speak in short sentences Neurological:     Mental Status: She is alert.  Psychiatric:        Behavior: Behavior is cooperative.    PE limited by virtual encounter  No results found for this or any previous visit (from the past 72 hour(s)).  Assessment and Plan:     ICD-10-CM   1. Upper respiratory tract infection due to COVID-19 virus  U07.1    J06.9    sx onset 10/1 or 10/2, day 8 or 9 of sx, worsening cough, wheeze chest tightness with hx of uncontrolled asthma, myalgias better, no fever, +night sweats Near end of isolation - would want improving sx (aka decreased cough and no more night sweats, and no fever) isolate 1-2 more days, add back OTC cough meds No red flags - no CP, tachycardia, syncope, swelling, confusion  Treat for asthma exacerbation as noted below F/up if not improved over the next week - if prolonged respiratory sx suspect pt would benefit from maintenance inhaler    2. Moderate asthma with acute exacerbation, unspecified whether persistent  J45.901  ipratropium-albuterol (DUONEB) 0.5-2.5 (3) MG/3ML SOLN    prednisoLONE (PRELONE) 15 MG/5ML SOLN   steroids - needs liquid due to recent surgery and difficulty swallowing - do steroids (can do dex 10 mg IM  shot in clinic if needed) nebs, cough meds otc +/- abx - but with recent surgery, GI issues, concern about taking pills/tablets - will hold on abx for now, no hypoxia, fatigue, CP, doubt pneumonia and pt cannot take macrolide which may be appropriate with hx of asthma, unfortunately allergic - would not do doxy right now     3. Paresthesias  R20.2    all extremities, coming and going x 3 days, unclear if related to illness, thyroid, or postop nutritional /absorption decificencies? - can do labs is sx persist Pt does have bariatric f/up with labs next month Encouraged pt to f/up if any worsening    4. Other specified hypothyroidism  E03.8 ARMOUR THYROID 120 MG tablet    TSH   will need TSH recheck in 3-4 weeks, can do virtual f/up if wanted - need to change her f/up appt and labs ordered      5. Gastroesophageal reflux disease, unspecified whether esophagitis present  K21.9    on meds per bariatric surgery, s/p gastric bypass    6. 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 She is on PPI post op for 6 months - instructed to continue meds but make sure they are taken 4+ hours apart from thyroid meds.     -Red flags and when to present for emergency care or RTC chest pain, shortness of  breath, new/worsening/un-resolving symptoms, reviewed with patient at time of visit. Follow up and care instructions discussed and provided in AVS. - I discussed the assessment and treatment plan with the patient. The patient was provided an opportunity to ask questions and all were answered. The patient agreed with the plan and demonstrated an understanding of the instructions.  I provided 28+ minutes of non-face-to-face time during this encounter.  Delsa Grana, PA-C 06/16/21 11:02 AM

## 2021-06-23 ENCOUNTER — Encounter: Payer: Self-pay | Admitting: Family Medicine

## 2021-06-26 ENCOUNTER — Other Ambulatory Visit: Payer: Self-pay | Admitting: Unknown Physician Specialty

## 2021-06-26 ENCOUNTER — Ambulatory Visit: Payer: Medicaid Other | Admitting: Family Medicine

## 2021-06-26 ENCOUNTER — Telehealth: Payer: Self-pay | Admitting: Family Medicine

## 2021-06-26 DIAGNOSIS — M1710 Unilateral primary osteoarthritis, unspecified knee: Secondary | ICD-10-CM

## 2021-06-26 NOTE — Telephone Encounter (Signed)
Pt would like to get another referral sent to Orthopedics at [facility] due to the other one being closed and she was not able to get scheduled at that time. Please Advice.

## 2021-06-26 NOTE — Telephone Encounter (Signed)
Called pt to notify, no answer left detailed vm referral was placed.

## 2021-06-26 NOTE — Telephone Encounter (Signed)
Copied from [place] [id number]. Topic: Referral - Request for Referral >> Jun 26, 2021  2:10 PM Leward Quan A wrote: Has patient seen PCP for this complaint? Yes.   *If NO, is insurance requiring patient see PCP for this issue before PCP can refer them? Referral for which specialty: Orthopedic Preferred provider/office: [facility] Reason for referral: Knee pain

## 2021-06-30 ENCOUNTER — Ambulatory Visit: Payer: Self-pay | Admitting: *Deleted

## 2021-06-30 ENCOUNTER — Encounter: Payer: Self-pay | Admitting: Family Medicine

## 2021-06-30 ENCOUNTER — Telehealth (INDEPENDENT_AMBULATORY_CARE_PROVIDER_SITE_OTHER): Payer: Medicaid Other | Admitting: Family Medicine

## 2021-06-30 VITALS — Ht 61.0 in | Wt 260.0 lb

## 2021-06-30 DIAGNOSIS — J0110 Acute frontal sinusitis, unspecified: Secondary | ICD-10-CM | POA: Diagnosis not present

## 2021-06-30 MED ORDER — AMOXICILLIN-POT CLAVULANATE 600-42.9 MG/5ML PO SUSR: 875.0000 mg | Freq: Two times a day (BID) | ORAL | 0 refills | Status: AC

## 2021-06-30 NOTE — Telephone Encounter (Signed)
Patient is post COVID 3 weeks- she is complaining of sinus congestion and pressure, sore throat, swollen R lymph node of neck. Patient request appointment for treatment- virtual visit scheduled.  Reason for Disposition  [1] Single large node AND [2] size > 1 inch (2.5 cm) AND [3] no fever  Answer Assessment - Initial Assessment Questions 1. LOCATION: "Where is the swollen node located?" "Is the matching node on the other side of the body also swollen?"      R side of neck 2. SIZE: "How big is the node?" (e.g., inches or centimeters; or compared to common objects such as pea, bean, marble, golf ball)      Bean size 3. ONSET: "When did the swelling start?"      Yesterday  4. NECK NODES: "Is there a sore throat, runny nose or other symptoms of a cold?"      sore throat, congested nose, cold 5. GROIN OR ARMPIT NODES: "Is there a sore, scratch, cut or painful red area on that arm or leg?"      no 6. FEVER: "Do you have a fever?" If Yes, ask: "What is it, how was it measured, and when did it start?"      Unsure- has not checked 7. CAUSE: "What do you think is causing the swollen lymph nodes?"     Post COVID- 3 weeks- secondary sinus infection 8. OTHER SYMPTOMS: "Do you have any other symptoms?"     no 9. PREGNANCY: "Is there any chance you are pregnant?" "When was your last menstrual period?"     na  Protocols used: Lymph Nodes - Swollen-A-AH

## 2021-06-30 NOTE — Progress Notes (Signed)
Virtual Visit via Video Note  I connected with [name] on 06/30/21 at  4:00 PM EDT by a video enabled telemedicine application and verified that I am speaking with the correct person using two identifiers.  Location: Patient: home Provider: [facility]   I discussed the limitations of evaluation and management by telemedicine and the availability of in person appointments. The patient expressed understanding and agreed to proceed.  History of Present Illness:  UPPER RESPIRATORY TRACT INFECTION - seen previously 10/3, 10/10. Received steroids 10/10 - symptom onset 10/1 - h/o asthma, was doing breathing treatment. - yesterday with fatigue, sore throat, ear pain, sinus pressure, headache. Underneath eyes and forehead  Worst symptom: Fever:  hasn't checked, has had chills Cough: yes, nonproductive Shortness of breath: no Chest pain: no Chest tightness: no Chest congestion: no Nasal congestion: yes Runny nose: no Sore throat: yes Swollen glands: yes Sinus pressure: yes Headache: yes Ear pain: yes bilateral L>R Ear pressure: yes bilateral L>R Vomiting: no Fatigue: no  Observations/Objective:  Tired appearing, in NAD. Congested sounding.  Assessment and Plan:  Bacterial sinusitis >10 days with worsening congestion, will treat for bacterial sinusitis. Augmentin x5 days. F/u in person if symptoms do not resolve.    I discussed the assessment and treatment plan with the patient. The patient was provided an opportunity to ask questions and all were answered. The patient agreed with the plan and demonstrated an understanding of the instructions.   The patient was advised to call back or seek an in-person evaluation if the symptoms worsen or if the condition fails to improve as anticipated.  I provided 8 minutes of non-face-to-face time during this encounter.   Myles Gip, DO

## 2021-06-30 NOTE — Patient Instructions (Signed)
It was great to see you!  Our plans for today:  - take the antibiotic as prescribed. - You can continue to take over the counter decongestant and cough suppressant. - Come back for an in person appointment if your symptoms do not get better after treatment.  Take care and seek immediate care sooner if you develop any concerns.   Dr. Ky Barban

## 2021-07-01 NOTE — Telephone Encounter (Signed)
Lvm for pt to call and schedule an appt earlier

## 2021-07-03 ENCOUNTER — Other Ambulatory Visit: Payer: Self-pay | Admitting: Family Medicine

## 2021-07-03 ENCOUNTER — Ambulatory Visit: Payer: Medicaid Other | Admitting: Family Medicine

## 2021-07-03 DIAGNOSIS — F331 Major depressive disorder, recurrent, moderate: Secondary | ICD-10-CM | POA: Diagnosis not present

## 2021-07-03 MED ORDER — FLUCONAZOLE 150 MG PO TABS: 150.0000 mg | ORAL_TABLET | ORAL | 0 refills | Status: DC

## 2021-07-08 DIAGNOSIS — Z419 Encounter for procedure for purposes other than remedying health state, unspecified: Secondary | ICD-10-CM | POA: Diagnosis not present

## 2021-07-15 DIAGNOSIS — F331 Major depressive disorder, recurrent, moderate: Secondary | ICD-10-CM | POA: Diagnosis not present

## 2021-07-16 DIAGNOSIS — M170 Bilateral primary osteoarthritis of knee: Secondary | ICD-10-CM | POA: Diagnosis not present

## 2021-07-16 DIAGNOSIS — M25561 Pain in right knee: Secondary | ICD-10-CM | POA: Diagnosis not present

## 2021-07-16 DIAGNOSIS — E038 Other specified hypothyroidism: Secondary | ICD-10-CM | POA: Diagnosis not present

## 2021-07-16 DIAGNOSIS — M25562 Pain in left knee: Secondary | ICD-10-CM | POA: Diagnosis not present

## 2021-07-16 LAB — TSH: TSH: 0.87 mIU/L

## 2021-07-17 DIAGNOSIS — F419 Anxiety disorder, unspecified: Secondary | ICD-10-CM | POA: Diagnosis not present

## 2021-07-17 DIAGNOSIS — E282 Polycystic ovarian syndrome: Secondary | ICD-10-CM | POA: Diagnosis not present

## 2021-07-17 DIAGNOSIS — Z9884 Bariatric surgery status: Secondary | ICD-10-CM | POA: Diagnosis not present

## 2021-07-17 DIAGNOSIS — R0989 Other specified symptoms and signs involving the circulatory and respiratory systems: Secondary | ICD-10-CM | POA: Diagnosis not present

## 2021-07-17 DIAGNOSIS — Z9049 Acquired absence of other specified parts of digestive tract: Secondary | ICD-10-CM | POA: Diagnosis not present

## 2021-07-17 DIAGNOSIS — F32A Depression, unspecified: Secondary | ICD-10-CM | POA: Diagnosis not present

## 2021-07-17 DIAGNOSIS — E669 Obesity, unspecified: Secondary | ICD-10-CM | POA: Diagnosis not present

## 2021-07-17 DIAGNOSIS — K219 Gastro-esophageal reflux disease without esophagitis: Secondary | ICD-10-CM | POA: Diagnosis not present

## 2021-07-17 DIAGNOSIS — E890 Postprocedural hypothyroidism: Secondary | ICD-10-CM | POA: Diagnosis not present

## 2021-07-17 DIAGNOSIS — K9189 Other postprocedural complications and disorders of digestive system: Secondary | ICD-10-CM | POA: Diagnosis not present

## 2021-07-17 DIAGNOSIS — F458 Other somatoform disorders: Secondary | ICD-10-CM | POA: Diagnosis not present

## 2021-07-17 DIAGNOSIS — K3189 Other diseases of stomach and duodenum: Secondary | ICD-10-CM | POA: Diagnosis not present

## 2021-07-17 DIAGNOSIS — Z90710 Acquired absence of both cervix and uterus: Secondary | ICD-10-CM | POA: Diagnosis not present

## 2021-07-17 DIAGNOSIS — G4730 Sleep apnea, unspecified: Secondary | ICD-10-CM | POA: Diagnosis not present

## 2021-07-17 DIAGNOSIS — J45909 Unspecified asthma, uncomplicated: Secondary | ICD-10-CM | POA: Diagnosis not present

## 2021-07-18 ENCOUNTER — Ambulatory Visit: Payer: Medicaid Other | Admitting: Nurse Practitioner

## 2021-07-18 ENCOUNTER — Other Ambulatory Visit: Payer: Self-pay

## 2021-07-18 ENCOUNTER — Encounter: Payer: Self-pay | Admitting: Nurse Practitioner

## 2021-07-18 VITALS — BP 124/72 | HR 98 | Temp 98.0°F | Resp 16 | Ht 61.0 in | Wt 244.5 lb

## 2021-07-18 DIAGNOSIS — R1310 Dysphagia, unspecified: Secondary | ICD-10-CM

## 2021-07-18 DIAGNOSIS — E038 Other specified hypothyroidism: Secondary | ICD-10-CM | POA: Diagnosis not present

## 2021-07-18 NOTE — Progress Notes (Signed)
BP 124/72   Pulse 98   Temp 98 F (36.7 C)   Resp 16   Ht 5\' 1"  (1.549 m)   Wt 244 lb 8 oz (110.9 kg)   SpO2 96%   BMI 46.20 kg/m    Subjective:    Patient ID: [name], female    DOB: December 24, 1975, 45 y.o.   MRN: [id number]  HPI: [name] is a 45 y.o. female,here alone  Chief Complaint  Patient presents with   Hypothyroidism   Hypothyroidism:  Her TSH was low on 05/26/21 it was 0.06.  We increased her armour thyroid from 120 mg to 150 mcg five days a week and 120 mg two days a week.  She then decreased herself back to 120 mg daily.  She had labs drawn two days ago and it was up to 0.87.  Will continue current dose and will recheck labs at next visit.   Dysphagia/ Esophageal stricture:  She had an EGD done yesterday to have her esophagus stretched.  Will have second procedure in two months to stretch it again.   Relevant past medical, surgical, family and social history reviewed and updated as indicated. Interim medical history since our last visit reviewed. Allergies and medications reviewed and updated.  Review of Systems  Constitutional: Negative for fever, positive weight change.  HEENT: positive for dysphagia Respiratory: Negative for cough and shortness of breath.   Cardiovascular: Negative for chest pain or palpitations.  Gastrointestinal: Negative for abdominal pain, no bowel changes.  Musculoskeletal: Negative for gait problem or joint swelling.  Skin: Negative for rash.  Neurological: Negative for dizziness or headache.  No other specific complaints in a complete review of systems (except as listed in HPI above).      Objective:    BP 124/72   Pulse 98   Temp 98 F (36.7 C)   Resp 16   Ht 5\' 1"  (1.549 m)   Wt 244 lb 8 oz (110.9 kg)   SpO2 96%   BMI 46.20 kg/m   Wt Readings from Last 3 Encounters:  07/18/21 244 lb 8 oz (110.9 kg)  06/30/21 260 lb (117.9 kg)  06/09/21 260 lb (117.9 kg)    Physical Exam  Constitutional: Patient appears  well-developed and well-nourished. Obese No distress.  HEENT: head atraumatic, normocephalic, pupils equal and reactive to light, neck supple Cardiovascular: Normal rate, regular rhythm and normal heart sounds.  No murmur heard. No BLE edema. Pulmonary/Chest: Effort normal and breath sounds normal. No respiratory distress. Abdominal: Soft.  There is no tenderness. Psychiatric: Patient has a normal mood and affect. behavior is normal. Judgment and thought content normal.   Results for orders placed or performed in visit on 05/26/21  TSH  Result Value Ref Range   TSH 0.06 (L) mIU/L  CBC with Differential/Platelet  Result Value Ref Range   WBC 6.4 3.8 - 10.8 Thousand/uL   RBC 5.09 3.80 - 5.10 Million/uL   Hemoglobin 14.8 11.7 - 15.5 g/dL   HCT 44.0 35.0 - 45.0 %   MCV 86.4 80.0 - 100.0 fL   MCH 29.1 27.0 - 33.0 pg   MCHC 33.6 32.0 - 36.0 g/dL   RDW 12.1 11.0 - 15.0 %   Platelets 209 140 - 400 Thousand/uL   MPV 11.3 7.5 - 12.5 fL   Neutro Abs 4,307 1,500 - 7,800 cells/uL   Lymphs Abs 1,242 850 - 3,900 cells/uL   Absolute Monocytes 659 200 - 950 cells/uL   Eosinophils  Absolute 160 15 - 500 cells/uL   Basophils Absolute 32 0 - 200 cells/uL   Neutrophils Relative % 67.3 %   Total Lymphocyte 19.4 %   Monocytes Relative 10.3 %   Eosinophils Relative 2.5 %   Basophils Relative 0.5 %      Assessment & Plan:   1. Other specified hypothyroidism - continue taking armour thyroid 120 mg.  2. Dysphagia, unspecified type -keep GI appointment for EGD    Follow up plan: Return in about 3 months (around 10/18/2021) for follow up.

## 2021-07-29 DIAGNOSIS — F331 Major depressive disorder, recurrent, moderate: Secondary | ICD-10-CM | POA: Diagnosis not present

## 2021-08-05 ENCOUNTER — Encounter (INDEPENDENT_AMBULATORY_CARE_PROVIDER_SITE_OTHER): Payer: Self-pay | Admitting: Family Medicine

## 2021-08-05 ENCOUNTER — Other Ambulatory Visit: Payer: Self-pay

## 2021-08-05 ENCOUNTER — Ambulatory Visit (INDEPENDENT_AMBULATORY_CARE_PROVIDER_SITE_OTHER): Payer: Medicaid Other | Admitting: Family Medicine

## 2021-08-05 VITALS — BP 139/79 | HR 78 | Temp 98.0°F | Ht 61.0 in | Wt 233.0 lb

## 2021-08-05 DIAGNOSIS — Z6841 Body Mass Index (BMI) 40.0 and over, adult: Secondary | ICD-10-CM | POA: Diagnosis not present

## 2021-08-05 DIAGNOSIS — E538 Deficiency of other specified B group vitamins: Secondary | ICD-10-CM | POA: Diagnosis not present

## 2021-08-05 DIAGNOSIS — Z9884 Bariatric surgery status: Secondary | ICD-10-CM

## 2021-08-05 DIAGNOSIS — E559 Vitamin D deficiency, unspecified: Secondary | ICD-10-CM | POA: Diagnosis not present

## 2021-08-05 DIAGNOSIS — R7303 Prediabetes: Secondary | ICD-10-CM

## 2021-08-05 DIAGNOSIS — R5383 Other fatigue: Secondary | ICD-10-CM

## 2021-08-05 NOTE — Progress Notes (Signed)
Chief Complaint:   OBESITY [name] is here to discuss her progress with her obesity treatment plan along with follow-up of her obesity related diagnoses. [name] is on practicing portion control and making smarter food choices, such as increasing vegetables and decreasing simple carbohydrates and states she is following her eating plan approximately 70% of the time. [name] states she is walking 7 times per week.  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 She has done very  well with her weight loss. She has had some complications with gastric stricture and she sees her weight loss surgeon tomorrow. She has had no symptoms of dumping syndrome. She has no dietary restrictions, but she is struggling to meet her protein goals.    Subjective:   1. Pre-diabetes [name] has done very well with weight loss. She is due to have labs rechecked.  2. Vitamin D deficiency [name] is status post weight loss surgery, and she is on bariatric vitamins. She is due for labs.  3. B12 deficiency [name] is at high risk of B12 deficiency due to limited diet. She notes fatigue.  4. S/P gastric bypass  Assessment/Plan:   1. Pre-diabetes We will check labs today. Ane will continue to work on weight loss, exercise, and decreasing simple carbohydrates to help decrease the risk of diabetes.   - CMP14+EGFR - Lipid Panel With LDL/HDL Ratio - Insulin, random - Hemoglobin A1c  2. Vitamin D deficiency Low Vitamin D level contributes to fatigue and are associated with obesity, breast, and colon cancer. We will check labs today. [name] will follow-up for routine testing of Vitamin D, at least 2-3 times per year to avoid over-replacement.  - VITAMIN D 25 Hydroxy (Vit-D  Deficiency, Fractures)  3. B12 deficiency The diagnosis was reviewed with the patient. We will check labs today. [name] will continue to follow up as directed. Orders and follow up as documented in patient record.  - Vitamin B12 - CBC with Differential/Platelet  4. S/P gastric bypass  5. Obesity BMI today is 55 [name] is currently in the action stage of change. As such, her goal is to continue with weight loss efforts. She has agreed to practicing portion control and making smarter food choices, such as increasing vegetables and decreasing simple carbohydrates.   [name] was educated on what behaviors to look for which will eventually sabotage her efforts.  Exercise goals: Start chair yoga.  Behavioral modification strategies: holiday eating strategies .  [name] has agreed to follow-up with our clinic in 3 weeks. She was informed of the importance of frequent follow-up visits to maximize her success with intensive lifestyle modifications for her multiple health conditions.   Objective:   Blood pressure 139/79, pulse 78, temperature 98 F (36.7 C), height $RemoveBe'5\' 1"'HjmCBxYJM$  (1.549 m), weight 233 lb (105.7 kg), SpO2 96 %. Body mass index is 44.02 kg/m.  General: Cooperative, alert, well developed, in no acute distress. HEENT: Conjunctivae and lids unremarkable. Cardiovascular: Regular rhythm.  Lungs: Normal work of breathing. Neurologic: No focal deficits.   Lab Results  Component Value Date   CREATININE 0.80 03/26/2021   BUN 18 03/26/2021   NA 137 03/26/2021   K 4.3 03/26/2021   CL 103 03/26/2021   CO2 24 03/26/2021   Lab  Results  Component Value Date   ALT 48 (H) 03/26/2021   AST 32 03/26/2021   ALKPHOS 80 07/22/2020   BILITOT 0.6 03/26/2021   Lab Results  Component Value Date   HGBA1C 5.8 (H) 03/26/2021   HGBA1C 5.7 (H) 07/22/2020   HGBA1C 5.7 (H) 12/07/2019   HGBA1C 5.8 (H) 08/07/2019   HGBA1C 5.6 08/29/2018   Lab Results  Component Value Date   INSULIN 33.2 (H) 07/22/2020    INSULIN 24.7 12/07/2019   INSULIN 26.1 (H) 08/07/2019   INSULIN 29.5 (H) 08/29/2018   INSULIN 46.1 (H) 04/27/2018   Lab Results  Component Value Date   TSH 0.87 07/16/2021   Lab Results  Component Value Date   CHOL 173 03/26/2021   HDL 48 (L) 03/26/2021   LDLCALC 97 03/26/2021   TRIG 181 (H) 03/26/2021   CHOLHDL 3.6 03/26/2021   Lab Results  Component Value Date   VD25OH 50.4 07/22/2020   VD25OH 49.1 12/07/2019   VD25OH 61.5 08/07/2019   Lab Results  Component Value Date   WBC 6.4 05/26/2021   HGB 14.8 05/26/2021   HCT 44.0 05/26/2021   MCV 86.4 05/26/2021   PLT 209 05/26/2021   No results found for: IRON, TIBC, FERRITIN  Attestation Statements:   Reviewed by clinician on day of visit: allergies, medications, problem list, medical history, surgical history, family history, social history, and previous encounter notes.  Time spent on visit including pre-visit chart review and post-visit care and charting was 45 minutes.    I, Trixie Dredge, am acting as transcriptionist for Dennard Nip, MD.  I have reviewed the above documentation for accuracy and completeness, and I agree with the above. -  Dennard Nip, MD

## 2021-08-06 ENCOUNTER — Encounter (INDEPENDENT_AMBULATORY_CARE_PROVIDER_SITE_OTHER): Payer: Self-pay | Admitting: Family Medicine

## 2021-08-06 DIAGNOSIS — R634 Abnormal weight loss: Secondary | ICD-10-CM | POA: Diagnosis not present

## 2021-08-06 DIAGNOSIS — Z713 Dietary counseling and surveillance: Secondary | ICD-10-CM | POA: Diagnosis not present

## 2021-08-06 DIAGNOSIS — Z9884 Bariatric surgery status: Secondary | ICD-10-CM | POA: Diagnosis not present

## 2021-08-06 DIAGNOSIS — Z6841 Body Mass Index (BMI) 40.0 and over, adult: Secondary | ICD-10-CM | POA: Diagnosis not present

## 2021-08-06 DIAGNOSIS — E079 Disorder of thyroid, unspecified: Secondary | ICD-10-CM | POA: Diagnosis not present

## 2021-08-06 LAB — CMP14+EGFR
ALT: 26 IU/L (ref 0–32)
AST: 23 IU/L (ref 0–40)
Albumin/Globulin Ratio: 1.5 (ref 1.2–2.2)
Albumin: 4.3 g/dL (ref 3.8–4.8)
Alkaline Phosphatase: 98 IU/L (ref 44–121)
BUN/Creatinine Ratio: 15 (ref 9–23)
BUN: 7 mg/dL (ref 6–24)
Bilirubin Total: 0.6 mg/dL (ref 0.0–1.2)
CO2: 21 mmol/L (ref 20–29)
Calcium: 9.5 mg/dL (ref 8.7–10.2)
Chloride: 99 mmol/L (ref 96–106)
Creatinine, Ser: 0.48 mg/dL — ABNORMAL LOW (ref 0.57–1.00)
Globulin, Total: 2.9 g/dL (ref 1.5–4.5)
Glucose: 73 mg/dL (ref 70–99)
Potassium: 3.7 mmol/L (ref 3.5–5.2)
Sodium: 139 mmol/L (ref 134–144)
Total Protein: 7.2 g/dL (ref 6.0–8.5)
eGFR: 118 mL/min/{1.73_m2} (ref >59)

## 2021-08-06 LAB — HEMOGLOBIN A1C
Est. average glucose Bld gHb Est-mCnc: 111 mg/dL
Hgb A1c MFr Bld: 5.5 % (ref 4.8–5.6)

## 2021-08-06 LAB — VITAMIN D 25 HYDROXY (VIT D DEFICIENCY, FRACTURES): Vit D, 25-Hydroxy: 71.1 ng/mL (ref 30.0–100.0)

## 2021-08-06 LAB — CBC WITH DIFFERENTIAL/PLATELET
Basophils Absolute: 0 10*3/uL (ref 0.0–0.2)
Basos: 0 %
EOS (ABSOLUTE): 0.1 10*3/uL (ref 0.0–0.4)
Eos: 1 %
Hematocrit: 44.8 % (ref 34.0–46.6)
Hemoglobin: 15.1 g/dL (ref 11.1–15.9)
Immature Grans (Abs): 0 10*3/uL (ref 0.0–0.1)
Immature Granulocytes: 0 %
Lymphocytes Absolute: 1.1 10*3/uL (ref 0.7–3.1)
Lymphs: 12 %
MCH: 29.3 pg (ref 26.6–33.0)
MCHC: 33.7 g/dL (ref 31.5–35.7)
MCV: 87 fL (ref 79–97)
Monocytes Absolute: 0.6 10*3/uL (ref 0.1–0.9)
Monocytes: 6 %
Neutrophils Absolute: 7.3 10*3/uL — ABNORMAL HIGH (ref 1.4–7.0)
Neutrophils: 81 %
Platelets: 192 10*3/uL (ref 150–450)
RBC: 5.15 x10E6/uL (ref 3.77–5.28)
RDW: 14.5 % (ref 11.7–15.4)
WBC: 9.1 10*3/uL (ref 3.4–10.8)

## 2021-08-06 LAB — LIPID PANEL WITH LDL/HDL RATIO
Cholesterol, Total: 151 mg/dL (ref 100–199)
HDL: 44 mg/dL (ref >39)
LDL Chol Calc (NIH): 86 mg/dL (ref 0–99)
LDL/HDL Ratio: 2 ratio (ref 0.0–3.2)
Triglycerides: 117 mg/dL (ref 0–149)
VLDL Cholesterol Cal: 21 mg/dL (ref 5–40)

## 2021-08-06 LAB — VITAMIN B12: Vitamin B-12: 889 pg/mL (ref 232–1245)

## 2021-08-06 LAB — INSULIN, RANDOM: INSULIN: 10.6 u[IU]/mL (ref 2.6–24.9)

## 2021-08-06 NOTE — Telephone Encounter (Signed)
Please see message and advise.  Thank you. ° °

## 2021-08-07 DIAGNOSIS — Z419 Encounter for procedure for purposes other than remedying health state, unspecified: Secondary | ICD-10-CM | POA: Diagnosis not present

## 2021-08-07 NOTE — Telephone Encounter (Signed)
Please add anemia panel to labs

## 2021-08-11 NOTE — Telephone Encounter (Signed)
Added

## 2021-08-11 NOTE — Addendum Note (Signed)
Addended by: Darral Dash on: 08/11/2021 10:34 AM   Modules accepted: Orders

## 2021-08-13 ENCOUNTER — Telehealth (INDEPENDENT_AMBULATORY_CARE_PROVIDER_SITE_OTHER): Payer: Medicaid Other | Admitting: Nurse Practitioner

## 2021-08-13 ENCOUNTER — Other Ambulatory Visit: Payer: Self-pay

## 2021-08-13 ENCOUNTER — Encounter: Payer: Self-pay | Admitting: Nurse Practitioner

## 2021-08-13 VITALS — Ht 61.0 in | Wt 230.0 lb

## 2021-08-13 DIAGNOSIS — J069 Acute upper respiratory infection, unspecified: Secondary | ICD-10-CM | POA: Diagnosis not present

## 2021-08-13 DIAGNOSIS — J4520 Mild intermittent asthma, uncomplicated: Secondary | ICD-10-CM | POA: Diagnosis not present

## 2021-08-13 NOTE — Progress Notes (Signed)
Name: [name]   MRN: [id number]    DOB: 02/28/1976   Date:08/13/2021       Progress Note  Subjective  Chief Complaint  Chief Complaint  Patient presents with   URI    Cough, sinus drainage, sore throat, hoarse for 1 week    I connected with  [name]  on 08/13/21 at  2:20 PM EST by a video enabled telemedicine application and verified that I am speaking with the correct person using two identifiers.  I discussed the limitations of evaluation and management by telemedicine and the availability of in person appointments. The patient expressed understanding and agreed to proceed with a virtual visit  Staff also discussed with the patient that there may be a patient responsible charge related to this service. Patient Location: home Provider Location: [facility] Additional Individuals present: alone  HPI  URI: She says that last Wednesday she started having a cough, nasal congestion, sore throat and headache. She denies fever, chest pain, or shortness of breath.  She says her cough has gotten better but she is still having nasal congestion and pressure.  Discussed OTC treatments for symptoms.  Discussed likely viral infection not requiring antibiotics at this time.    Asthma: She says she has had no wheezing and has not used her inhaler.    Patient Active Problem List   Diagnosis Date Noted   S/P gastric bypass 05/01/2021   Primary osteoarthritis of left knee 03/19/2021   Tendonitis, Achilles, right 03/19/2021   DDD (degenerative disc disease), cervical 01/13/2021   Asthma 01/13/2021   Scoliosis deformity of spine 01/13/2021   Spinal stenosis of lumbar region 01/13/2021   Current moderate episode of major depressive disorder without prior episode ([place]) 12/20/2019   Scapular dysfunction 12/20/2019   Chronic pain of right knee 12/20/2019   Depression 02/21/2019   Morbid obesity due to excess calories ([place]) 10/20/2018   Mild intermittent asthma with acute exacerbation 05/13/2018    Allergic rhinitis 03/09/2018   Tension headache 03/09/2018   Other fatigue 12/07/2017   Shortness of breath on exertion 12/07/2017   Other specified hypothyroidism 12/07/2017   Prediabetes 10/15/2017   Gastroesophageal reflux disease without esophagitis 10/15/2017   Nasal valve collapse 10/15/2017   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/01/2017   Chronic midline back pain 10/01/2017   Vitamin D deficiency 10/01/2017   Anxiety 10/01/2017   PCOS (polycystic ovarian syndrome) 10/01/2017   Severe episode of recurrent major depressive disorder, without psychotic features ([place]) 10/01/2017   BMI 50.0-59.9, adult ([place]) 10/01/2017   Fibromyalgia 10/01/2017   Postoperative hypothyroidism 10/01/2017   OSA (obstructive sleep apnea) 02/26/2014    Social History   Tobacco Use   Smoking status: Former    Packs/day: 0.25    Years: 12.00    Pack years: 3.00    Types: Cigarettes    Quit date: 09/07/2004    Years since quitting: 16.9   Smokeless tobacco: Never  Substance Use Topics   Alcohol use: Yes    Comment: 4 times a year     Current Outpatient Medications:    acetaminophen (TYLENOL) 325 MG tablet, Take 650 mg by mouth every 6 (six) hours as needed., Disp: , Rfl:    albuterol (VENTOLIN HFA) 108 (90 Base) MCG/ACT inhaler, Inhale 2 puffs into the lungs every 6 (six) hours as needed for wheezing or shortness of breath., Disp: 6.7 g, Rfl: 3   ARMOUR THYROID 120 MG tablet, Take 1 tablet (120 mg total) by  mouth daily before breakfast., Disp: 90 tablet, Rfl: 3   clonazePAM (KLONOPIN) 0.5 MG tablet, Take 0.5 mg by mouth 3 (three) times daily as needed for anxiety., Disp: , Rfl:    cyclobenzaprine (FLEXERIL) 10 MG tablet, Take 0.5-1 tablets (5-10 mg total) by mouth 3 (three) times daily as needed for muscle spasms. Take mainly at bedtime as may make drowsy, Disp: 30 tablet, Rfl: 5   DULoxetine (CYMBALTA) 30 MG capsule, Take 30 mg by mouth daily., Disp: , Rfl:    estradiol (VIVELLE-DOT) 0.05 MG/24HR  patch, Place 1 patch onto the skin 2 (two) times a week., Disp: , Rfl:    fexofenadine (ALLEGRA) 180 MG tablet, Take 1 tablet (180 mg total) by mouth daily., Disp: 90 tablet, Rfl: 1   gabapentin (NEURONTIN) 100 MG capsule, Take 100 mg by mouth 3 (three) times daily., Disp: , Rfl:    ipratropium-albuterol (DUONEB) 0.5-2.5 (3) MG/3ML SOLN, Take 3 mLs by nebulization every 6 (six) hours as needed (cough/wheeze asthma exacerbation)., Disp: 180 mL, Rfl: 0   montelukast (SINGULAIR) 10 MG tablet, Take 1 tablet (10 mg total) by mouth at bedtime., Disp: 90 tablet, Rfl: 3   Multiple Vitamins-Minerals (MULTIVITAMIN WITH MINERALS) tablet, Take 1 tablet by mouth daily., Disp: , Rfl:    omeprazole (PRILOSEC) 20 MG capsule, Take 20 mg by mouth daily., Disp: , Rfl:    ondansetron (ZOFRAN-ODT) 4 MG disintegrating tablet, Take 4 mg by mouth every 6 (six) hours as needed., Disp: , Rfl:    pantoprazole (PROTONIX) 40 MG tablet, Take 40 mg by mouth daily., Disp: , Rfl:    progesterone (PROMETRIUM) 100 MG capsule, progesterone micronized 100 mg capsule  TAKE ONE CAPSULE BY MOUTH EVERY DAY, Disp: , Rfl:    VITAMIN D, CHOLECALCIFEROL, PO, Take 50 mcg by mouth 2 (two) times daily., Disp: , Rfl:    zolpidem (AMBIEN) 5 MG tablet, Take 10 mg by mouth at bedtime as needed., Disp: , Rfl:    fluconazole (DIFLUCAN) 150 MG tablet, Take 1 tablet (150 mg total) by mouth every other day. (Patient not taking: Reported on 08/13/2021), Disp: 3 tablet, Rfl: 0  Allergies  Allergen Reactions   Erythromycin    Erythromycin Base    Zyrtec [Cetirizine Hcl]     Only allergic to Zyrtec D -     I personally reviewed active problem list, medication list, allergies, notes from last encounter with the patient/caregiver today.  ROS  Constitutional: Negative for fever or weight change.  HEENT: positive for nasal congestion and pressure, sore throat Respiratory:Positive for cough, negative for shortness of breath.   Cardiovascular: Negative  for chest pain or palpitations.  Gastrointestinal: Negative for abdominal pain, no bowel changes.  Musculoskeletal: Negative for gait problem or joint swelling.  Skin: Negative for rash.  Neurological: Negative for dizziness, positive for headache.  No other specific complaints in a complete review of systems (except as listed in HPI above).   Objective  Virtual encounter, vitals not obtained.  Body mass index is 43.46 kg/m.  Nursing Note and Vital Signs reviewed.  Physical Exam  Awake, alert and oriented, speaking in complete sentences  No results found for this or any previous visit (from the past 72 hour(s)).  Assessment & Plan  1. Viral upper respiratory tract infection -use OTC treatments for symptoms -warm compress across face, gargle with salt water -push fluids  2. Mild intermittent asthma without complication -use inhaler if needed  -Red flags and when to present for emergency care  or RTC including fever >101.75F, chest pain, shortness of breath, new/worsening/un-resolving symptoms, reviewed with patient at time of visit. Follow up and care instructions discussed and provided in AVS. - I discussed the assessment and treatment plan with the patient. The patient was provided an opportunity to ask questions and all were answered. The patient agreed with the plan and demonstrated an understanding of the instructions.  I provided 20 minutes of non-face-to-face time during this encounter.  Bo Merino, FNP

## 2021-08-14 ENCOUNTER — Encounter: Payer: Self-pay | Admitting: Nurse Practitioner

## 2021-08-15 ENCOUNTER — Other Ambulatory Visit: Payer: Self-pay | Admitting: Nurse Practitioner

## 2021-08-15 DIAGNOSIS — J4531 Mild persistent asthma with (acute) exacerbation: Secondary | ICD-10-CM

## 2021-08-15 DIAGNOSIS — J0140 Acute pansinusitis, unspecified: Secondary | ICD-10-CM

## 2021-08-15 MED ORDER — BUDESONIDE-FORMOTEROL FUMARATE 160-4.5 MCG/ACT IN AERO: 2.0000 | INHALATION_SPRAY | Freq: Two times a day (BID) | RESPIRATORY_TRACT | 3 refills | Status: DC

## 2021-08-15 MED ORDER — AMOXICILLIN-POT CLAVULANATE 875-125 MG PO TABS: 1.0000 | ORAL_TABLET | Freq: Two times a day (BID) | ORAL | 0 refills | Status: AC

## 2021-08-20 DIAGNOSIS — M1712 Unilateral primary osteoarthritis, left knee: Secondary | ICD-10-CM | POA: Diagnosis not present

## 2021-08-26 ENCOUNTER — Ambulatory Visit (INDEPENDENT_AMBULATORY_CARE_PROVIDER_SITE_OTHER): Payer: Medicaid Other | Admitting: Family Medicine

## 2021-08-26 ENCOUNTER — Other Ambulatory Visit: Payer: Self-pay

## 2021-08-26 ENCOUNTER — Encounter (INDEPENDENT_AMBULATORY_CARE_PROVIDER_SITE_OTHER): Payer: Self-pay | Admitting: Family Medicine

## 2021-08-26 VITALS — BP 117/72 | HR 70 | Temp 98.1°F | Ht 61.0 in | Wt 222.0 lb

## 2021-08-26 DIAGNOSIS — Z6841 Body Mass Index (BMI) 40.0 and over, adult: Secondary | ICD-10-CM

## 2021-08-26 DIAGNOSIS — J45909 Unspecified asthma, uncomplicated: Secondary | ICD-10-CM | POA: Diagnosis not present

## 2021-08-26 DIAGNOSIS — Z9884 Bariatric surgery status: Secondary | ICD-10-CM

## 2021-08-27 LAB — IRON AND TIBC
Iron Saturation: 29 % (ref 15–55)
Iron: 89 ug/dL (ref 27–159)
Total Iron Binding Capacity: 308 ug/dL (ref 250–450)
UIBC: 219 ug/dL (ref 131–425)

## 2021-08-27 LAB — VITAMIN B12: Vitamin B-12: 849 pg/mL (ref 232–1245)

## 2021-08-27 LAB — FOLATE: Folate: 12.3 ng/mL (ref >3.0)

## 2021-08-27 LAB — FERRITIN: Ferritin: 241 ng/mL — ABNORMAL HIGH (ref 15–150)

## 2021-08-27 NOTE — Progress Notes (Signed)
Chief Complaint:   OBESITY [name] is here to discuss her progress with her obesity treatment plan along with follow-up of her obesity related diagnoses. [name] is on practicing portion control and making smarter food choices, such as increasing vegetables and decreasing simple carbohydrates and states she is following her eating plan approximately 75% of the time. [name] states she is walking 6,000 steps 5 times per week.   Today's visit was #: 25 Starting weight: 292 lbs Starting date: 12/07/2017 Today's weight: 222 lbs Today's date: 08/26/2021 Total lbs lost to date: 70 Total lbs lost since last in-office visit: 10  Interim History: [name] continues to do very well with weight loss even with increased holiday temptations. She notes hunger is controlled and she finds her cravings have decreased. She is status post gastric bypass surgery on 05/01/2021. She is working on increasing her water and protein intake. She is making strategies to help avoid holiday weight gain.  Subjective:   1. Mild asthma, unspecified whether complicated, unspecified whether persistent [name] has had a recent flare up of asthma. She is not on maintenance medication currently.   2. S/P gastric bypass [name] is working on diet and exercise. She needs her anemia panel checked per her surgeon.  Assessment/Plan:   1. Mild asthma, unspecified whether complicated, unspecified whether persistent [name] was encouraged to discussed this with her primary care provider, but asthma also tends to improve in adults with weight loss.  2. S/P gastric bypass We will check labs today, and we will follow up at [facility] next visit.  - Folate - Iron and TIBC - Ferritin - Vitamin B12  3. Obesity BMI today is 34 [name] is currently in the action stage of change. As such, her goal is to continue with weight loss efforts. She has agreed to practicing portion control and making smarter food choices, such as increasing vegetables and  decreasing simple carbohydrates.   Exercise goals: As is.  Behavioral modification strategies: no skipping meals and holiday eating strategies .  [name] has agreed to follow-up with our clinic in 4 to 6 weeks. She was informed of the importance of frequent follow-up visits to maximize her success with intensive lifestyle modifications for her multiple health conditions.   [name] was informed we would discuss her lab results at her next visit unless there is a critical issue that needs to be addressed sooner. Jonathan agreed to keep her next visit at the agreed upon time to discuss these results.  Objective:   Blood pressure 117/72, pulse 70, temperature 98.1 F (36.7 C), height 5\' 1"  (1.549 m), weight 222 lb (100.7 kg), SpO2 97 %. Body mass index is 41.95 kg/m.  General: Cooperative, alert, well developed, in no acute distress. HEENT: Conjunctivae and lids unremarkable. Cardiovascular: Regular rhythm.  Lungs: Normal work of breathing. Neurologic: No focal deficits.   Lab Results  Component Value Date   CREATININE 0.48 (L) 08/05/2021   BUN 7 08/05/2021   NA 139 08/05/2021   K 3.7 08/05/2021   CL 99 08/05/2021   CO2 21 08/05/2021   Lab Results  Component Value Date   ALT 26 08/05/2021   AST 23 08/05/2021   ALKPHOS 98 08/05/2021   BILITOT 0.6 08/05/2021   Lab Results  Component Value Date   HGBA1C 5.5 08/05/2021   HGBA1C 5.8 (H) 03/26/2021   HGBA1C 5.7 (H) 07/22/2020   HGBA1C 5.7 (H) 12/07/2019   HGBA1C 5.8 (H) 08/07/2019   Lab Results  Component Value Date  INSULIN 10.6 08/05/2021   INSULIN 33.2 (H) 07/22/2020   INSULIN 24.7 12/07/2019   INSULIN 26.1 (H) 08/07/2019   INSULIN 29.5 (H) 08/29/2018   Lab Results  Component Value Date   TSH 0.87 07/16/2021   Lab Results  Component Value Date   CHOL 151 08/05/2021   HDL 44 08/05/2021   LDLCALC 86 08/05/2021   TRIG 117 08/05/2021   CHOLHDL 3.6 03/26/2021   Lab Results  Component Value Date   VD25OH 71.1  08/05/2021   VD25OH 50.4 07/22/2020   VD25OH 49.1 12/07/2019   Lab Results  Component Value Date   WBC 9.1 08/05/2021   HGB 15.1 08/05/2021   HCT 44.8 08/05/2021   MCV 87 08/05/2021   PLT 192 08/05/2021   Lab Results  Component Value Date   IRON 89 08/26/2021   TIBC 308 08/26/2021   FERRITIN 241 (H) 08/26/2021   Attestation Statements:   Reviewed by clinician on day of visit: allergies, medications, problem list, medical history, surgical history, family history, social history, and previous encounter notes.  Time spent on visit including pre-visit chart review and post-visit care and charting was 33 minutes.    I, Trixie Dredge, am acting as transcriptionist for Dennard Nip, MD.  I have reviewed the above documentation for accuracy and completeness, and I agree with the above. -  Dennard Nip, MD

## 2021-09-03 DIAGNOSIS — M1712 Unilateral primary osteoarthritis, left knee: Secondary | ICD-10-CM | POA: Diagnosis not present

## 2021-09-07 DIAGNOSIS — Z419 Encounter for procedure for purposes other than remedying health state, unspecified: Secondary | ICD-10-CM | POA: Diagnosis not present

## 2021-09-16 DIAGNOSIS — F331 Major depressive disorder, recurrent, moderate: Secondary | ICD-10-CM | POA: Diagnosis not present

## 2021-09-17 DIAGNOSIS — K9589 Other complications of other bariatric procedure: Secondary | ICD-10-CM | POA: Diagnosis not present

## 2021-09-17 DIAGNOSIS — F32A Depression, unspecified: Secondary | ICD-10-CM | POA: Diagnosis not present

## 2021-09-17 DIAGNOSIS — R7303 Prediabetes: Secondary | ICD-10-CM | POA: Diagnosis not present

## 2021-09-17 DIAGNOSIS — G4733 Obstructive sleep apnea (adult) (pediatric): Secondary | ICD-10-CM | POA: Diagnosis not present

## 2021-09-17 DIAGNOSIS — M797 Fibromyalgia: Secondary | ICD-10-CM | POA: Diagnosis not present

## 2021-09-17 DIAGNOSIS — J4520 Mild intermittent asthma, uncomplicated: Secondary | ICD-10-CM | POA: Diagnosis not present

## 2021-09-17 DIAGNOSIS — E282 Polycystic ovarian syndrome: Secondary | ICD-10-CM | POA: Diagnosis not present

## 2021-09-17 DIAGNOSIS — K219 Gastro-esophageal reflux disease without esophagitis: Secondary | ICD-10-CM | POA: Diagnosis not present

## 2021-09-17 DIAGNOSIS — F419 Anxiety disorder, unspecified: Secondary | ICD-10-CM | POA: Diagnosis not present

## 2021-09-17 DIAGNOSIS — Z6841 Body Mass Index (BMI) 40.0 and over, adult: Secondary | ICD-10-CM | POA: Diagnosis not present

## 2021-09-17 DIAGNOSIS — K9189 Other postprocedural complications and disorders of digestive system: Secondary | ICD-10-CM | POA: Diagnosis not present

## 2021-09-17 DIAGNOSIS — G44209 Tension-type headache, unspecified, not intractable: Secondary | ICD-10-CM | POA: Diagnosis not present

## 2021-09-17 DIAGNOSIS — E890 Postprocedural hypothyroidism: Secondary | ICD-10-CM | POA: Diagnosis not present

## 2021-09-29 ENCOUNTER — Other Ambulatory Visit: Payer: Self-pay

## 2021-09-29 ENCOUNTER — Encounter (INDEPENDENT_AMBULATORY_CARE_PROVIDER_SITE_OTHER): Payer: Self-pay | Admitting: Family Medicine

## 2021-09-29 ENCOUNTER — Ambulatory Visit (INDEPENDENT_AMBULATORY_CARE_PROVIDER_SITE_OTHER): Payer: Medicaid Other | Admitting: Family Medicine

## 2021-09-29 VITALS — BP 137/81 | HR 63 | Temp 97.3°F | Ht 61.0 in | Wt 211.0 lb

## 2021-09-29 DIAGNOSIS — Z6841 Body Mass Index (BMI) 40.0 and over, adult: Secondary | ICD-10-CM

## 2021-09-29 DIAGNOSIS — R7303 Prediabetes: Secondary | ICD-10-CM | POA: Diagnosis not present

## 2021-09-29 NOTE — Progress Notes (Signed)
Chief Complaint:   OBESITY [name] is here to discuss her progress with her obesity treatment plan along with follow-up of her obesity related diagnoses. [name] is on practicing portion control and making smarter food choices, such as increasing vegetables and decreasing simple carbohydrates and states she is following her eating plan approximately 75% of the time. [name] states she is walking for 20-30 minutes 4-5 times per week.  Today's visit was #: 75 Starting weight: 292 lbs Starting date: 12/07/2017 Today's weight: 211 lbs Today's date: 09/29/2021 Total lbs lost to date: 81 Total lbs lost since last in-office visit: 11  Interim History: [name] continues to do well with weight loss. She notes some hair loss and struggles to meet her protein goals. She is status post gastric bypass.  Subjective:   1. Pre-diabetes [name] is status post weight loss surgery. She is doing well with weight loss. She is not on metformin and she denies polyphagia.   Assessment/Plan:   1. Pre-diabetes [name] will continue to decrease simple carbohydrates to help decrease the risk of diabetes. We will recheck labs in 1-2 months.   2. Obesity, current BMI 39.9 [name] is currently in the action stage of change. As such, her goal is to continue with weight loss efforts. She has agreed to practicing portion control and making smarter food choices, such as increasing vegetables and decreasing simple carbohydrates.   Protein rich food including vegetarian options were given to the patient.  Exercise goals: As is.  Behavioral modification strategies: increasing lean protein intake.  [name] has agreed to follow-up with our clinic in 4 to 6 weeks. She was informed of the importance of frequent follow-up visits to maximize her success with intensive lifestyle modifications for her multiple health conditions.   Objective:   Blood pressure 137/81, pulse 63, temperature (!) 97.3 F (36.3 C), height 5\' 1"  (1.549 m),  weight 211 lb (95.7 kg), SpO2 96 %. Body mass index is 39.87 kg/m.  General: Cooperative, alert, well developed, in no acute distress. HEENT: Conjunctivae and lids unremarkable. Cardiovascular: Regular rhythm.  Lungs: Normal work of breathing. Neurologic: No focal deficits.   Lab Results  Component Value Date   CREATININE 0.48 (L) 08/05/2021   BUN 7 08/05/2021   NA 139 08/05/2021   K 3.7 08/05/2021   CL 99 08/05/2021   CO2 21 08/05/2021   Lab Results  Component Value Date   ALT 26 08/05/2021   AST 23 08/05/2021   ALKPHOS 98 08/05/2021   BILITOT 0.6 08/05/2021   Lab Results  Component Value Date   HGBA1C 5.5 08/05/2021   HGBA1C 5.8 (H) 03/26/2021   HGBA1C 5.7 (H) 07/22/2020   HGBA1C 5.7 (H) 12/07/2019   HGBA1C 5.8 (H) 08/07/2019   Lab Results  Component Value Date   INSULIN 10.6 08/05/2021   INSULIN 33.2 (H) 07/22/2020   INSULIN 24.7 12/07/2019   INSULIN 26.1 (H) 08/07/2019   INSULIN 29.5 (H) 08/29/2018   Lab Results  Component Value Date   TSH 0.87 07/16/2021   Lab Results  Component Value Date   CHOL 151 08/05/2021   HDL 44 08/05/2021   LDLCALC 86 08/05/2021   TRIG 117 08/05/2021   CHOLHDL 3.6 03/26/2021   Lab Results  Component Value Date   VD25OH 71.1 08/05/2021   VD25OH 50.4 07/22/2020   VD25OH 49.1 12/07/2019   Lab Results  Component Value Date   WBC 9.1 08/05/2021   HGB 15.1 08/05/2021   HCT 44.8 08/05/2021  MCV 87 08/05/2021   PLT 192 08/05/2021   Lab Results  Component Value Date   IRON 89 08/26/2021   TIBC 308 08/26/2021   FERRITIN 241 (H) 08/26/2021   Attestation Statements:   Reviewed by clinician on day of visit: allergies, medications, problem list, medical history, surgical history, family history, social history, and previous encounter notes.  Time spent on visit including pre-visit chart review and post-visit care and charting was 33 minutes.    I, Trixie Dredge, am acting as transcriptionist for Dennard Nip, MD.  I  have reviewed the above documentation for accuracy and completeness, and I agree with the above. -  Dennard Nip, MD

## 2021-09-30 DIAGNOSIS — F331 Major depressive disorder, recurrent, moderate: Secondary | ICD-10-CM | POA: Diagnosis not present

## 2021-10-08 ENCOUNTER — Ambulatory Visit: Payer: Medicaid Other | Admitting: Physician Assistant

## 2021-10-08 DIAGNOSIS — Z419 Encounter for procedure for purposes other than remedying health state, unspecified: Secondary | ICD-10-CM | POA: Diagnosis not present

## 2021-10-14 DIAGNOSIS — F331 Major depressive disorder, recurrent, moderate: Secondary | ICD-10-CM | POA: Diagnosis not present

## 2021-10-28 DIAGNOSIS — F331 Major depressive disorder, recurrent, moderate: Secondary | ICD-10-CM | POA: Diagnosis not present

## 2021-10-31 DIAGNOSIS — K449 Diaphragmatic hernia without obstruction or gangrene: Secondary | ICD-10-CM | POA: Diagnosis not present

## 2021-10-31 DIAGNOSIS — R7303 Prediabetes: Secondary | ICD-10-CM | POA: Diagnosis not present

## 2021-10-31 DIAGNOSIS — M797 Fibromyalgia: Secondary | ICD-10-CM | POA: Diagnosis not present

## 2021-10-31 DIAGNOSIS — Z9884 Bariatric surgery status: Secondary | ICD-10-CM | POA: Diagnosis not present

## 2021-10-31 DIAGNOSIS — R634 Abnormal weight loss: Secondary | ICD-10-CM | POA: Diagnosis not present

## 2021-10-31 DIAGNOSIS — Z7189 Other specified counseling: Secondary | ICD-10-CM | POA: Diagnosis not present

## 2021-10-31 DIAGNOSIS — Z6841 Body Mass Index (BMI) 40.0 and over, adult: Secondary | ICD-10-CM | POA: Diagnosis not present

## 2021-10-31 DIAGNOSIS — G4733 Obstructive sleep apnea (adult) (pediatric): Secondary | ICD-10-CM | POA: Diagnosis not present

## 2021-11-03 ENCOUNTER — Ambulatory Visit (INDEPENDENT_AMBULATORY_CARE_PROVIDER_SITE_OTHER): Payer: Medicaid Other | Admitting: Family Medicine

## 2021-11-03 ENCOUNTER — Encounter (INDEPENDENT_AMBULATORY_CARE_PROVIDER_SITE_OTHER): Payer: Self-pay | Admitting: Family Medicine

## 2021-11-03 ENCOUNTER — Other Ambulatory Visit: Payer: Self-pay

## 2021-11-03 VITALS — BP 102/67 | HR 69 | Temp 97.8°F | Ht 61.0 in | Wt 203.0 lb

## 2021-11-03 DIAGNOSIS — E669 Obesity, unspecified: Secondary | ICD-10-CM | POA: Diagnosis not present

## 2021-11-03 DIAGNOSIS — K219 Gastro-esophageal reflux disease without esophagitis: Secondary | ICD-10-CM

## 2021-11-03 DIAGNOSIS — Z6838 Body mass index (BMI) 38.0-38.9, adult: Secondary | ICD-10-CM | POA: Diagnosis not present

## 2021-11-03 DIAGNOSIS — F331 Major depressive disorder, recurrent, moderate: Secondary | ICD-10-CM | POA: Diagnosis not present

## 2021-11-03 DIAGNOSIS — F411 Generalized anxiety disorder: Secondary | ICD-10-CM | POA: Diagnosis not present

## 2021-11-04 NOTE — Progress Notes (Signed)
Chief Complaint:   OBESITY [name] is here to discuss her progress with her obesity treatment plan along with follow-up of her obesity related diagnoses. [name] is on practicing portion control and making smarter food choices, such as increasing vegetables and decreasing simple carbohydrates and states she is following her eating plan approximately 80% of the time. [name] states she is at the gym for 45 minutes and walking for 30 minutes 3 times per week.  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 She is working on meeting her protein goals and hydration goals.  Subjective:   1. Gastroesophageal reflux disease, unspecified whether esophagitis present [name] is on Prilosec since her gastric bypass surgery. She does not have symptoms and would like to discontinue this.  Assessment/Plan:   1. Gastroesophageal reflux disease, unspecified whether esophagitis present Intensive lifestyle modifications are the first line treatment for this issue. We discussed several lifestyle modifications today. [name] is ok to discontinue Prilosec, and will continue to monitor. She will contact us if symptoms develop, and continue to avoid NSAIDS. She will continue to work on diet, exercise and weight loss efforts. Orders and follow up as documented in patient record.   2. Obesity, current BMI 38.5 [name] is currently in the action stage of change. As such, her goal is to continue with weight loss efforts. She has agreed to practicing portion control and making smarter food choices, such as increasing vegetables and decreasing simple carbohydrates.   Exercise goals: As is.  Behavioral modification strategies: increasing lean  protein intake, increasing water intake, and no skipping meals.  [name] has agreed to follow-up with our clinic in 6 weeks. She was informed of the importance of frequent follow-up visits to maximize her success with intensive lifestyle modifications for her multiple health conditions.   Objective:   Blood pressure 102/67, pulse 69, temperature 97.8 F (36.6 C), height 5\' 1"  (1.549 m), weight 203 lb (92.1 kg), SpO2 97 %. Body mass index is 38.36 kg/m.  General: Cooperative, alert, well developed, in no acute distress. HEENT: Conjunctivae and lids unremarkable. Cardiovascular: Regular rhythm.  Lungs: Normal work of breathing. Neurologic: No focal deficits.   Lab Results  Component Value Date   CREATININE 0.48 (L) 08/05/2021   BUN 7 08/05/2021   NA 139 08/05/2021   K 3.7 08/05/2021   CL 99 08/05/2021   CO2 21 08/05/2021   Lab Results  Component Value Date   ALT 26 08/05/2021   AST 23 08/05/2021   ALKPHOS 98 08/05/2021   BILITOT 0.6 08/05/2021   Lab Results  Component Value Date   HGBA1C 5.5 08/05/2021   HGBA1C 5.8 (H) 03/26/2021   HGBA1C 5.7 (H) 07/22/2020   HGBA1C 5.7 (H) 12/07/2019   HGBA1C 5.8 (H) 08/07/2019   Lab Results  Component Value Date   INSULIN 10.6 08/05/2021   INSULIN 33.2 (H) 07/22/2020   INSULIN 24.7 12/07/2019   INSULIN 26.1 (H) 08/07/2019   INSULIN 29.5 (H) 08/29/2018   Lab Results  Component Value Date   TSH 0.87 07/16/2021   Lab Results  Component Value Date   CHOL 151 08/05/2021   HDL 44 08/05/2021   LDLCALC 86 08/05/2021   TRIG 117 08/05/2021  CHOLHDL 3.6 03/26/2021   Lab Results  Component Value Date   VD25OH 71.1 08/05/2021   VD25OH 50.4 07/22/2020   VD25OH 49.1 12/07/2019   Lab Results  Component Value Date   WBC 9.1 08/05/2021   HGB 15.1 08/05/2021   HCT 44.8 08/05/2021   MCV 87 08/05/2021   PLT 192 08/05/2021   Lab Results  Component Value Date   IRON 89 08/26/2021   TIBC 308 08/26/2021   FERRITIN 241 (H)  08/26/2021   Attestation Statements:   Reviewed by clinician on day of visit: allergies, medications, problem list, medical history, surgical history, family history, social history, and previous encounter notes.  Time spent on visit including pre-visit chart review and post-visit care and charting was 35 minutes.    I, Trixie Dredge, am acting as transcriptionist for Dennard Nip, MD.  I have reviewed the above documentation for accuracy and completeness, and I agree with the above. -  Dennard Nip, MD

## 2021-11-05 DIAGNOSIS — Z419 Encounter for procedure for purposes other than remedying health state, unspecified: Secondary | ICD-10-CM | POA: Diagnosis not present

## 2021-11-26 ENCOUNTER — Telehealth: Payer: Self-pay

## 2021-11-26 ENCOUNTER — Encounter: Payer: Self-pay | Admitting: Nurse Practitioner

## 2021-11-26 ENCOUNTER — Ambulatory Visit: Payer: Medicaid Other | Admitting: Nurse Practitioner

## 2021-11-26 ENCOUNTER — Other Ambulatory Visit: Payer: Self-pay

## 2021-11-26 VITALS — BP 124/78 | HR 96 | Temp 98.2°F | Resp 16 | Ht 61.0 in | Wt 197.8 lb

## 2021-11-26 DIAGNOSIS — J309 Allergic rhinitis, unspecified: Secondary | ICD-10-CM

## 2021-11-26 DIAGNOSIS — L299 Pruritus, unspecified: Secondary | ICD-10-CM

## 2021-11-26 DIAGNOSIS — E038 Other specified hypothyroidism: Secondary | ICD-10-CM

## 2021-11-26 DIAGNOSIS — J069 Acute upper respiratory infection, unspecified: Secondary | ICD-10-CM | POA: Diagnosis not present

## 2021-11-26 DIAGNOSIS — J0111 Acute recurrent frontal sinusitis: Secondary | ICD-10-CM

## 2021-11-26 LAB — POCT INFLUENZA A/B
Influenza A, POC: NEGATIVE
Influenza B, POC: NEGATIVE

## 2021-11-26 MED ORDER — MONTELUKAST SODIUM 5 MG PO CHEW: 10.0000 mg | CHEWABLE_TABLET | Freq: Every day | ORAL | 1 refills | Status: DC

## 2021-11-26 MED ORDER — CETIRIZINE HCL 5 MG/5ML PO SOLN: 10.0000 mg | Freq: Every day | ORAL | 1 refills | Status: DC

## 2021-11-26 MED ORDER — BETAMETHASONE DIPROPIONATE 0.05 % EX CREA: TOPICAL_CREAM | CUTANEOUS | 0 refills | Status: DC

## 2021-11-26 MED ORDER — FLUTICASONE PROPIONATE 50 MCG/ACT NA SUSP: 2.0000 | Freq: Every day | NASAL | 6 refills | Status: DC

## 2021-11-26 MED ORDER — AMOXICILLIN-POT CLAVULANATE 600-42.9 MG/5ML PO SUSR: 875.0000 mg | Freq: Two times a day (BID) | ORAL | 0 refills | Status: DC

## 2021-11-26 NOTE — Progress Notes (Signed)
? ?BP 124/78   Pulse 96   Temp 98.2 ?F (36.8 ?C) (Oral)   Resp 16   Ht '5\' 1"'$  (1.549 m)   Wt 197 lb 12.8 oz (89.7 kg)   SpO2 98%   BMI 37.37 kg/m?   ? ?Subjective:  ? ? Patient ID: [name], female    DOB: Sep 20, 1975, 46 y.o.   MRN: [id number] ? ?HPI: ?[name] is a 46 y.o. female, here alone ? ?Chief Complaint  ?Patient presents with  ? Sinusitis  ? ?URI/allergies/sinus infection:  She says her daughter had the same symptoms and tested positive for the flu and her niece also has symptoms and was treated for strep. She reports symptoms of nasal congestion, headache, facial pressure, scratchy throat and fatigue. She says she has had the symptoms for two weeks.  She say she has not been taking her allergy medication because she could not swallow the pill , changed prescription for chewable and liquid to help with that. She denies any cough, shortness of breath or headache. Discussed testing for flu and covid and she would like to be tested.   ? ?Hypothyroidism:  She say she has been feeling really fatigued lately. She said it might be from being sick but she would like to have her thyroid level checked.  Labs ordered.  Last TSH was 0.87 on 07/16/2021. She is currently taking armour thyroid 120 mg daily. ? ?Relevant past medical, surgical, family and social history reviewed and updated as indicated. Interim medical history since our last visit reviewed. ?Allergies and medications reviewed and updated. ? ?Review of Systems ? ?Constitutional: Negative for fever or weight change. Positive for fatigue ?HEENT: positive for nasal congestion, facial pressure, scratchy throat ?Respiratory: Negative for cough and shortness of breath.   ?Cardiovascular: Negative for chest pain or palpitations.  ?Gastrointestinal: Negative for abdominal pain, no bowel changes.  ?Musculoskeletal: Negative for gait problem or joint swelling.  ?Skin: Negative for rash.  ?Neurological: Negative for dizziness, positive for  headache.  ?No  other specific complaints in a complete review of systems (except as listed in HPI above).  ? ?   ?Objective:  ?  ?BP 124/78   Pulse 96   Temp 98.2 ?F (36.8 ?C) (Oral)   Resp 16   Ht '5\' 1"'$  (1.549 m)   Wt 197 lb 12.8 oz (89.7 kg)   SpO2 98%   BMI 37.37 kg/m?   ?Wt Readings from Last 3 Encounters:  ?11/26/21 197 lb 12.8 oz (89.7 kg)  ?11/03/21 203 lb (92.1 kg)  ?09/29/21 211 lb (95.7 kg)  ?  ?Physical Exam ? ?Constitutional: Patient appears well-developed and well-nourished. Obese = No distress.  ?HEENT: head atraumatic, normocephalic, pupils equal and reactive to light, ears TMs clear, neck supple, throat within normal limits ?Cardiovascular: Normal rate, regular rhythm and normal heart sounds.  No murmur heard. No BLE edema. ?Pulmonary/Chest: Effort normal and breath sounds normal. No respiratory distress. ?Abdominal: Soft.  There is no tenderness. ?Psychiatric: Patient has a normal mood and affect. behavior is normal. Judgment and thought content normal.  ? ?Results for orders placed or performed in visit on 11/26/21  ?POCT Influenza A/B  ?Result Value Ref Range  ? Influenza A, POC Negative Negative  ? Influenza B, POC Negative Negative  ? ?   ?Assessment & Plan:  ? ?1. Viral upper respiratory tract infection ? ?- cetirizine HCl (ZYRTEC) 5 MG/5ML SOLN; Take 10 mLs (10 mg total) by mouth daily.  Dispense: 300  mL; Refill: 1 ?- POCT Influenza A/B ?- Novel Coronavirus, NAA ([facility]) ?- fluticasone (FLONASE) 50 MCG/ACT nasal spray; Place 2 sprays into both nostrils daily.  Dispense: 16 g; Refill: 6 ? ?2. Allergic rhinitis, unspecified seasonality, unspecified trigger ? ?- montelukast (SINGULAIR) 5 MG chewable tablet; Chew 2 tablets (10 mg total) by mouth at bedtime.  Dispense: 60 tablet; Refill: 1 ?- cetirizine HCl (ZYRTEC) 5 MG/5ML SOLN; Take 10 mLs (10 mg total) by mouth daily.  Dispense: 300 mL; Refill: 1 ? ?3. Acute recurrent frontal sinusitis ? ?- amoxicillin-clavulanate (AUGMENTIN ES-600) 600-42.9 MG/5ML  suspension; Take 7.3 mLs (875 mg total) by mouth 2 (two) times daily for 5 days.  Dispense: 73 mL; Refill: 0 ? ?4. Other specified hypothyroidism ? ?- Thyroid Panel With TSH ? ?5. Itching of ear ?-she said that her ears itchy from time to time and she was seen by ent and given cream to apply for the itching.  She says she needs a refill, refill sent ?- betamethasone dipropionate 0.05 % cream; Apply topically 3 (three) times a week. 3 times weekly as needed for ear itching  Dispense: 30 g; Refill: 0  ? ?Follow up plan: ?Return if symptoms worsen or fail to improve. ? ? ? ? ? ?

## 2021-11-26 NOTE — Telephone Encounter (Signed)
Copied from [place] [id number]. Topic: General - Other >> Nov 26, 2021  2:49 PM Valere Dross wrote: Reason for CRM: [facility] called in stating medication amoxicillin-clavulanate (AUGMENTIN ES-600) 600-42.9 MG/5ML suspension is on back order and they are unable to refill right now, they wanted to see what the PCP wanted to do as far as finding another pharmacy or sending over an alternate script, please advise.

## 2021-11-27 ENCOUNTER — Other Ambulatory Visit: Payer: Self-pay | Admitting: Nurse Practitioner

## 2021-11-27 ENCOUNTER — Telehealth: Payer: Self-pay | Admitting: Family Medicine

## 2021-11-27 ENCOUNTER — Encounter: Payer: Self-pay | Admitting: Nurse Practitioner

## 2021-11-27 DIAGNOSIS — E038 Other specified hypothyroidism: Secondary | ICD-10-CM

## 2021-11-27 DIAGNOSIS — J0140 Acute pansinusitis, unspecified: Secondary | ICD-10-CM

## 2021-11-27 DIAGNOSIS — J069 Acute upper respiratory infection, unspecified: Secondary | ICD-10-CM | POA: Diagnosis not present

## 2021-11-27 LAB — THYROID PANEL WITH TSH
Free Thyroxine Index: 2.3 (ref 1.4–3.8)
T3 Uptake: 26 % (ref 22–35)
T4, Total: 8.8 ug/dL (ref 5.1–11.9)
TSH: 0.04 mIU/L — ABNORMAL LOW

## 2021-11-27 MED ORDER — AMOXICILLIN-POT CLAVULANATE 875-125 MG PO TABS: 1.0000 | ORAL_TABLET | Freq: Two times a day (BID) | ORAL | 0 refills | Status: AC

## 2021-11-27 MED ORDER — THYROID 90 MG PO TABS
90.0000 mg | ORAL_TABLET | Freq: Every day | ORAL | 0 refills | Status: DC
Start: 2021-11-27 — End: 2022-01-12

## 2021-11-27 MED ORDER — THYROID 15 MG PO TABS
15.0000 mg | ORAL_TABLET | Freq: Every day | ORAL | 0 refills | Status: DC
Start: 2021-11-27 — End: 2022-01-12

## 2021-11-27 NOTE — Telephone Encounter (Signed)
Pharmacy called and can not get amoxicillin-clavulanate (AUGMENTIN ES-600) 600-42.9 MG/5ML suspension / they asked if provider wants to send something else or send this Rx to another location / please advise  ?

## 2021-11-27 NOTE — Telephone Encounter (Signed)
FYI

## 2021-11-27 NOTE — Telephone Encounter (Signed)
Duplicate message. 

## 2021-11-27 NOTE — Telephone Encounter (Signed)
Patient notified

## 2021-11-28 LAB — NOVEL CORONAVIRUS, NAA: SARS-CoV-2, NAA: NOT DETECTED

## 2021-12-06 DIAGNOSIS — Z419 Encounter for procedure for purposes other than remedying health state, unspecified: Secondary | ICD-10-CM | POA: Diagnosis not present

## 2021-12-15 ENCOUNTER — Ambulatory Visit (INDEPENDENT_AMBULATORY_CARE_PROVIDER_SITE_OTHER): Payer: Medicaid Other | Admitting: Family Medicine

## 2021-12-15 ENCOUNTER — Encounter (INDEPENDENT_AMBULATORY_CARE_PROVIDER_SITE_OTHER): Payer: Self-pay | Admitting: Family Medicine

## 2021-12-15 VITALS — BP 90/58 | HR 69 | Temp 97.9°F | Ht 61.0 in | Wt 189.0 lb

## 2021-12-15 DIAGNOSIS — I9589 Other hypotension: Secondary | ICD-10-CM | POA: Diagnosis not present

## 2021-12-15 DIAGNOSIS — Z6835 Body mass index (BMI) 35.0-35.9, adult: Secondary | ICD-10-CM

## 2021-12-15 DIAGNOSIS — E669 Obesity, unspecified: Secondary | ICD-10-CM | POA: Diagnosis not present

## 2021-12-16 NOTE — Progress Notes (Signed)
? ? ? ?Chief Complaint:  ? ?OBESITY ?[name] is here to discuss her progress with her obesity treatment plan along with follow-up of her obesity related diagnoses. [name] is on practicing portion control and making smarter food choices, such as increasing vegetables and decreasing simple carbohydrates and states she is following her eating plan approximately 80% of the time. [name] states she is walking and at the gym for 20-30 minutes 7 times per week. ? ?Today's visit was #: 37 ?Starting weight: 292 lbs ?Starting date: 12/07/2017 ?Today's weight: 189 lbs ?Today's date: 12/15/2021 ?Total lbs lost to date: 103 ?Total lbs lost since last in-office visit: 14 ? ?Interim History: [name] has done well with weight loss over the last 6 weeks. She has done well routinely meeting her protein goals. She is starting to have appropriate hunger signals and is doing better not skipping meals.  ? ?Subjective:  ? ?1. Other specified hypotension ?Tea's blood pressure is lower than normal. She has done well with weight loss, and she is trying to hydrate. She notes feeling somewhat dizzy after rapid movements. She is not on antihypertensives.  ? ?Assessment/Plan:  ? ?1. Other specified hypotension ?[name] is to increase her water intake and she is ok to drink electrolytes to help with symptoms. Will continue to monitor.  ? ?2. Obesity, current BMI 35.7 ?[name] is currently in the action stage of change. As such, her goal is to continue with weight loss efforts. She has agreed to practicing portion control and making smarter food choices, such as increasing vegetables and decreasing simple carbohydrates.  ? ?Exercise goals: As is.  ? ?Behavioral modification strategies: increasing lean protein intake and decreasing simple carbohydrates. ? ?[name] has agreed to follow-up with our clinic in 6 weeks. She was informed of the importance of frequent follow-up visits to maximize her success with intensive lifestyle modifications for her multiple  health conditions.  ? ?Objective:  ? ?Blood pressure (!) 90/58, pulse 69, temperature 97.9 ?F (36.6 ?C), height '5\' 1"'$  (1.549 m), weight 189 lb (85.7 kg), SpO2 95 %. ?Body mass index is 35.71 kg/m?. ? ?General: Cooperative, alert, well developed, in no acute distress. ?HEENT: Conjunctivae and lids unremarkable. ?Cardiovascular: Regular rhythm.  ?Lungs: Normal work of breathing. ?Neurologic: No focal deficits.  ? ?Lab Results  ?Component Value Date  ? CREATININE 0.48 (L) 08/05/2021  ? BUN 7 08/05/2021  ? NA 139 08/05/2021  ? K 3.7 08/05/2021  ? CL 99 08/05/2021  ? CO2 21 08/05/2021  ? ?Lab Results  ?Component Value Date  ? ALT 26 08/05/2021  ? AST 23 08/05/2021  ? ALKPHOS 98 08/05/2021  ? BILITOT 0.6 08/05/2021  ? ?Lab Results  ?Component Value Date  ? HGBA1C 5.5 08/05/2021  ? HGBA1C 5.8 (H) 03/26/2021  ? HGBA1C 5.7 (H) 07/22/2020  ? HGBA1C 5.7 (H) 12/07/2019  ? HGBA1C 5.8 (H) 08/07/2019  ? ?Lab Results  ?Component Value Date  ? INSULIN 10.6 08/05/2021  ? INSULIN 33.2 (H) 07/22/2020  ? INSULIN 24.7 12/07/2019  ? INSULIN 26.1 (H) 08/07/2019  ? INSULIN 29.5 (H) 08/29/2018  ? ?Lab Results  ?Component Value Date  ? TSH 0.04 (L) 11/26/2021  ? ?Lab Results  ?Component Value Date  ? CHOL 151 08/05/2021  ? HDL 44 08/05/2021  ? [place] 86 08/05/2021  ? TRIG 117 08/05/2021  ? CHOLHDL 3.6 03/26/2021  ? ?Lab Results  ?Component Value Date  ? VD25OH 71.1 08/05/2021  ? VD25OH 50.4 07/22/2020  ? VD25OH 49.1 12/07/2019  ? ?Lab  Results  ?Component Value Date  ? WBC 9.1 08/05/2021  ? HGB 15.1 08/05/2021  ? HCT 44.8 08/05/2021  ? MCV 87 08/05/2021  ? PLT 192 08/05/2021  ? ?Lab Results  ?Component Value Date  ? IRON 89 08/26/2021  ? TIBC 308 08/26/2021  ? FERRITIN 241 (H) 08/26/2021  ? ?Attestation Statements:  ? ?Reviewed by clinician on day of visit: allergies, medications, problem list, medical history, surgical history, family history, social history, and previous encounter notes. ? ?Time spent on visit including pre-visit chart  review and post-visit care and charting was 22 minutes.  ? ? ?I, Trixie Dredge, am acting as transcriptionist for Dennard Nip, MD. ? ?I have reviewed the above documentation for accuracy and completeness, and I agree with the above. -  Dennard Nip, MD ? ? ?

## 2021-12-19 DIAGNOSIS — F411 Generalized anxiety disorder: Secondary | ICD-10-CM | POA: Diagnosis not present

## 2021-12-19 DIAGNOSIS — F331 Major depressive disorder, recurrent, moderate: Secondary | ICD-10-CM | POA: Diagnosis not present

## 2022-01-01 DIAGNOSIS — F411 Generalized anxiety disorder: Secondary | ICD-10-CM | POA: Diagnosis not present

## 2022-01-05 DIAGNOSIS — Z419 Encounter for procedure for purposes other than remedying health state, unspecified: Secondary | ICD-10-CM | POA: Diagnosis not present

## 2022-01-07 ENCOUNTER — Other Ambulatory Visit: Payer: Self-pay | Admitting: Nurse Practitioner

## 2022-01-07 DIAGNOSIS — E038 Other specified hypothyroidism: Secondary | ICD-10-CM

## 2022-01-07 NOTE — Telephone Encounter (Signed)
Requested medication (s) are due for refill today - yes ? ?Requested medication (s) are on the active medication list -yes ? ?Future visit scheduled -no ? ?Last refill: 11/27/21 #45 ? ?Notes to clinic: Call to patient- left message to come to office for lab- need to make sure correct dosing. Request sent for PCP review  ? ?Requested Prescriptions  ?Pending Prescriptions Disp Refills  ? ARMOUR THYROID 90 MG tablet [Pharmacy Med Name: ARMOUR THYROID 90 MG TAB] 45 tablet 0  ?  Sig: TAKE 1 TABLET BY MOUTH ONCE A DAY  ?  ? Endocrinology:  Hypothyroid Agents Failed - 01/07/2022 10:55 AM  ?  ?  Failed - TSH in normal range and within 360 days  ?  TSH  ?Date Value Ref Range Status  ?11/26/2021 0.04 (L) mIU/L Final  ?  Comment:  ?            Reference Range ?. ?          > or = 20 Years  0.40-4.50 ?. ?               Pregnancy Ranges ?          First trimester    0.26-2.66 ?          Second trimester   0.55-2.73 ?          Third trimester    0.43-2.91 ?  ?  ?  ?  ?  Passed - Valid encounter within last 12 months  ?  Recent Outpatient Visits   ? ?      ? 1 month ago Viral upper respiratory tract infection  ? [facility] Serafina Royals F, FNP  ? 4 months ago Viral upper respiratory tract infection  ? [facility] Bo Merino, FNP  ? 5 months ago Other specified hypothyroidism  ? [facility] Serafina Royals F, FNP  ? 6 months ago Acute non-recurrent frontal sinusitis  ? [place], DO  ? 6 months ago Upper respiratory tract infection due to COVID-19 virus  ? [facility] Delsa Grana, PA-C  ? ?  ?  ? ? ?  ?  ?  ? ARMOUR THYROID 15 MG tablet [Pharmacy Med Name: ARMOUR THYROID 15 MG TAB] 45 tablet 0  ?  Sig: TAKE 1 TABLET BY MOUTH ONCE A DAY  ?  ? Endocrinology:  Hypothyroid Agents Failed - 01/07/2022 10:55 AM  ?  ?  Failed - TSH in normal range and within 360 days  ?  TSH  ?Date Value Ref Range Status  ?11/26/2021 0.04  (L) mIU/L Final  ?  Comment:  ?            Reference Range ?. ?          > or = 20 Years  0.40-4.50 ?. ?               Pregnancy Ranges ?          First trimester    0.26-2.66 ?          Second trimester   0.55-2.73 ?          Third trimester    0.43-2.91 ?  ?  ?  ?  ?  Passed - Valid encounter within last 12 months  ?  Recent Outpatient Visits   ? ?      ? 1 month ago  Viral upper respiratory tract infection  ? [facility] Serafina Royals F, FNP  ? 4 months ago Viral upper respiratory tract infection  ? [facility] Bo Merino, FNP  ? 5 months ago Other specified hypothyroidism  ? [facility] Serafina Royals F, FNP  ? 6 months ago Acute non-recurrent frontal sinusitis  ? [place], DO  ? 6 months ago Upper respiratory tract infection due to COVID-19 virus  ? [facility] Delsa Grana, [place]  ? ?  ?  ? ? ?  ?  ?  ? ? ? ?Requested Prescriptions  ?Pending Prescriptions Disp Refills  ? ARMOUR THYROID 90 MG tablet [Pharmacy Med Name: ARMOUR THYROID 90 MG TAB] 45 tablet 0  ?  Sig: TAKE 1 TABLET BY MOUTH ONCE A DAY  ?  ? Endocrinology:  Hypothyroid Agents Failed - 01/07/2022 10:55 AM  ?  ?  Failed - TSH in normal range and within 360 days  ?  TSH  ?Date Value Ref Range Status  ?11/26/2021 0.04 (L) mIU/L Final  ?  Comment:  ?            Reference Range ?. ?          > or = 20 Years  0.40-4.50 ?. ?               Pregnancy Ranges ?          First trimester    0.26-2.66 ?          Second trimester   0.55-2.73 ?          Third trimester    0.43-2.91 ?  ?  ?  ?  ?  Passed - Valid encounter within last 12 months  ?  Recent Outpatient Visits   ? ?      ? 1 month ago Viral upper respiratory tract infection  ? [facility] Serafina Royals F, FNP  ? 4 months ago Viral upper respiratory tract infection  ? [facility] Bo Merino, FNP  ? 5 months ago Other specified hypothyroidism   ? [facility] Serafina Royals F, FNP  ? 6 months ago Acute non-recurrent frontal sinusitis  ? [place], DO  ? 6 months ago Upper respiratory tract infection due to COVID-19 virus  ? [facility] Delsa Grana, PA-C  ? ?  ?  ? ? ?  ?  ?  ? ARMOUR THYROID 15 MG tablet [Pharmacy Med Name: ARMOUR THYROID 15 MG TAB] 45 tablet 0  ?  Sig: TAKE 1 TABLET BY MOUTH ONCE A DAY  ?  ? Endocrinology:  Hypothyroid Agents Failed - 01/07/2022 10:55 AM  ?  ?  Failed - TSH in normal range and within 360 days  ?  TSH  ?Date Value Ref Range Status  ?11/26/2021 0.04 (L) mIU/L Final  ?  Comment:  ?            Reference Range ?. ?          > or = 20 Years  0.40-4.50 ?. ?               Pregnancy Ranges ?          First trimester    0.26-2.66 ?          Second trimester   0.55-2.73 ?  Third trimester    0.43-2.91 ?  ?  ?  ?  ?  Passed - Valid encounter within last 12 months  ?  Recent Outpatient Visits   ? ?      ? 1 month ago Viral upper respiratory tract infection  ? [facility] Serafina Royals F, FNP  ? 4 months ago Viral upper respiratory tract infection  ? [facility] Bo Merino, FNP  ? 5 months ago Other specified hypothyroidism  ? [facility] Serafina Royals F, FNP  ? 6 months ago Acute non-recurrent frontal sinusitis  ? [place], DO  ? 6 months ago Upper respiratory tract infection due to COVID-19 virus  ? [facility] Delsa Grana, [place]  ? ?  ?  ? ? ?  ?  ?  ? ? ? ?

## 2022-01-08 ENCOUNTER — Other Ambulatory Visit: Payer: Self-pay | Admitting: Nurse Practitioner

## 2022-01-08 ENCOUNTER — Encounter: Payer: Self-pay | Admitting: Nurse Practitioner

## 2022-01-08 DIAGNOSIS — R5383 Other fatigue: Secondary | ICD-10-CM

## 2022-01-09 DIAGNOSIS — R5383 Other fatigue: Secondary | ICD-10-CM | POA: Diagnosis not present

## 2022-01-09 DIAGNOSIS — E038 Other specified hypothyroidism: Secondary | ICD-10-CM | POA: Diagnosis not present

## 2022-01-12 ENCOUNTER — Other Ambulatory Visit: Payer: Self-pay | Admitting: Nurse Practitioner

## 2022-01-12 DIAGNOSIS — E038 Other specified hypothyroidism: Secondary | ICD-10-CM

## 2022-01-12 LAB — ZINC: Zinc: 69 ug/dL (ref 60–130)

## 2022-01-12 LAB — IRON,TIBC AND FERRITIN PANEL
%SAT: 28 % (calc) (ref 16–45)
Ferritin: 142 ng/mL (ref 16–232)
Iron: 81 ug/dL (ref 40–190)
TIBC: 292 mcg/dL (calc) (ref 250–450)

## 2022-01-12 LAB — TSH: TSH: 2.97 mIU/L

## 2022-01-12 MED ORDER — THYROID 90 MG PO TABS: 90.0000 mg | ORAL_TABLET | Freq: Every day | ORAL | 0 refills | Status: DC

## 2022-01-12 MED ORDER — THYROID 15 MG PO TABS
15.0000 mg | ORAL_TABLET | Freq: Every day | ORAL | 0 refills | Status: DC
Start: 2022-01-12 — End: 2022-04-14

## 2022-01-15 DIAGNOSIS — F411 Generalized anxiety disorder: Secondary | ICD-10-CM | POA: Diagnosis not present

## 2022-01-21 DIAGNOSIS — F331 Major depressive disorder, recurrent, moderate: Secondary | ICD-10-CM | POA: Diagnosis not present

## 2022-01-21 DIAGNOSIS — F411 Generalized anxiety disorder: Secondary | ICD-10-CM | POA: Diagnosis not present

## 2022-01-25 ENCOUNTER — Encounter (INDEPENDENT_AMBULATORY_CARE_PROVIDER_SITE_OTHER): Payer: Self-pay | Admitting: Family Medicine

## 2022-01-26 ENCOUNTER — Ambulatory Visit (INDEPENDENT_AMBULATORY_CARE_PROVIDER_SITE_OTHER): Payer: Medicaid Other | Admitting: Family Medicine

## 2022-01-26 NOTE — Telephone Encounter (Signed)
ok 

## 2022-01-29 DIAGNOSIS — F411 Generalized anxiety disorder: Secondary | ICD-10-CM | POA: Diagnosis not present

## 2022-01-29 DIAGNOSIS — F331 Major depressive disorder, recurrent, moderate: Secondary | ICD-10-CM | POA: Diagnosis not present

## 2022-02-03 ENCOUNTER — Other Ambulatory Visit: Payer: Self-pay | Admitting: Nurse Practitioner

## 2022-02-03 DIAGNOSIS — J309 Allergic rhinitis, unspecified: Secondary | ICD-10-CM

## 2022-02-04 NOTE — Telephone Encounter (Signed)
Requested Prescriptions  Pending Prescriptions Disp Refills  . montelukast (SINGULAIR) 5 MG chewable tablet [Pharmacy Med Name: MONTELUKAST SODIUM 5 MG CHEW TAB] 180 tablet 0    Sig: CHEW 2 TABLETS BY MOUTH AT BEDTIME     Pulmonology:  Leukotriene Inhibitors Passed - 02/03/2022  8:47 AM      Passed - Valid encounter within last 12 months    Recent Outpatient Visits          2 months ago Viral upper respiratory tract infection   [facility] Bo Merino, FNP   5 months ago Viral upper respiratory tract infection   [facility] Bo Merino, FNP   6 months ago Other specified hypothyroidism   [facility] Bo Merino, FNP   7 months ago Acute non-recurrent frontal sinusitis   [place], DO   7 months ago Upper respiratory tract infection due to COVID-19 virus   [facility] Delsa Grana, [place]

## 2022-02-05 DIAGNOSIS — Z419 Encounter for procedure for purposes other than remedying health state, unspecified: Secondary | ICD-10-CM | POA: Diagnosis not present

## 2022-02-12 DIAGNOSIS — F411 Generalized anxiety disorder: Secondary | ICD-10-CM | POA: Diagnosis not present

## 2022-02-18 ENCOUNTER — Encounter (INDEPENDENT_AMBULATORY_CARE_PROVIDER_SITE_OTHER): Payer: Self-pay | Admitting: Family Medicine

## 2022-02-18 ENCOUNTER — Ambulatory Visit (INDEPENDENT_AMBULATORY_CARE_PROVIDER_SITE_OTHER): Payer: Medicaid Other | Admitting: Family Medicine

## 2022-02-18 VITALS — BP 100/69 | HR 69 | Temp 98.1°F | Ht 61.0 in | Wt 172.0 lb

## 2022-02-18 DIAGNOSIS — Z6832 Body mass index (BMI) 32.0-32.9, adult: Secondary | ICD-10-CM

## 2022-02-18 DIAGNOSIS — E669 Obesity, unspecified: Secondary | ICD-10-CM | POA: Diagnosis not present

## 2022-02-18 DIAGNOSIS — K219 Gastro-esophageal reflux disease without esophagitis: Secondary | ICD-10-CM | POA: Diagnosis not present

## 2022-02-18 NOTE — Progress Notes (Signed)
Chief Complaint:   OBESITY [name] is here to discuss her progress with her obesity treatment plan along with follow-up of her obesity related diagnoses. [name] is on practicing portion control and making smarter food choices, such as increasing vegetables and decreasing simple carbohydrates and states she is following her eating plan approximately 75% of the time. [name] states she is walking for 30 minutes 5 times per week.  Today's visit was #: 15 Starting weight: 292 lbs Starting date: 12/07/2017 Today's weight: 172 lbs Today's date: 02/18/2022 Total lbs lost to date: 120 Total lbs lost since last in-office visit: 17  Interim History: [name] continues to do well with weight loss.  She is status post weight loss surgery.  She has missed meals again due to stress and wedding planning.  She is working on not missing meals and increasing protein.    Subjective:   1. 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  She notes a mild epigastric burning, worse with increased stress.  She is concerned this may be an ulcer.  She has been drinking caffeine daily.  Assessment/Plan:   1. Gastroesophageal reflux disease, unspecified whether esophagitis present [name] is to discontinue caffeine, and see if her symptoms improve with Tums, and then report her symptoms to her surgeon.  We will continue to follow.  2. Obesity, Current BMI 32.6 [name] is currently in the action stage of change. As such, her goal is to continue with weight loss efforts. She has agreed to practicing portion control and making smarter food choices, such as increasing vegetables and decreasing simple carbohydrates.   Exercise goals: As is.  Behavioral modification strategies: increasing lean protein intake and no skipping meals.  [name] has agreed to follow-up with our clinic in 10 weeks. She was informed of the importance of frequent  follow-up visits to maximize her success with intensive lifestyle modifications for her multiple health conditions.   Objective:   Blood pressure 100/69, pulse 69, temperature 98.1 F (36.7 C), height '5\' 1"'$  (1.549 m), weight 172 lb (78 kg), SpO2 98 %. Body mass index is 32.5 kg/m.  General: Cooperative, alert, well developed, in no acute distress. HEENT: Conjunctivae and lids unremarkable. Cardiovascular: Regular rhythm.  Lungs: Normal work of breathing. Neurologic: No focal deficits.   Lab Results  Component Value Date   CREATININE 0.48 (L) 08/05/2021   BUN 7 08/05/2021   NA 139 08/05/2021   K 3.7 08/05/2021   CL 99 08/05/2021   CO2 21 08/05/2021   Lab Results  Component Value Date   ALT 26 08/05/2021   AST 23 08/05/2021   ALKPHOS 98 08/05/2021   BILITOT 0.6 08/05/2021   Lab Results  Component Value Date   HGBA1C 5.5 08/05/2021   HGBA1C 5.8 (H) 03/26/2021   HGBA1C 5.7 (H) 07/22/2020   HGBA1C 5.7 (H) 12/07/2019   HGBA1C 5.8 (H) 08/07/2019   Lab Results  Component Value Date   INSULIN 10.6 08/05/2021   INSULIN 33.2 (H) 07/22/2020   INSULIN 24.7 12/07/2019   INSULIN 26.1 (H) 08/07/2019   INSULIN 29.5 (H) 08/29/2018   Lab Results  Component Value Date   TSH 2.97 01/09/2022   Lab Results  Component Value Date   CHOL 151 08/05/2021   HDL 44 08/05/2021   LDLCALC 86 08/05/2021   TRIG 117 08/05/2021   CHOLHDL 3.6 03/26/2021   Lab Results  Component Value Date   VD25OH 71.1 08/05/2021  VD25OH 50.4 07/22/2020   VD25OH 49.1 12/07/2019   Lab Results  Component Value Date   WBC 9.1 08/05/2021   HGB 15.1 08/05/2021   HCT 44.8 08/05/2021   MCV 87 08/05/2021   PLT 192 08/05/2021   Lab Results  Component Value Date   IRON 81 01/09/2022   TIBC 292 01/09/2022   FERRITIN 142 01/09/2022   Attestation Statements:   Reviewed by clinician on day of visit: allergies, medications, problem list, medical history, surgical history, family history, social history,  and previous encounter notes.  Time spent on visit including pre-visit chart review and post-visit care and charting was 30 minutes.   I, Trixie Dredge, am acting as transcriptionist for Dennard Nip, MD.  I have reviewed the above documentation for accuracy and completeness, and I agree with the above. -  Dennard Nip, MD

## 2022-02-26 DIAGNOSIS — F411 Generalized anxiety disorder: Secondary | ICD-10-CM | POA: Diagnosis not present

## 2022-03-07 DIAGNOSIS — Z419 Encounter for procedure for purposes other than remedying health state, unspecified: Secondary | ICD-10-CM | POA: Diagnosis not present

## 2022-03-26 DIAGNOSIS — F411 Generalized anxiety disorder: Secondary | ICD-10-CM | POA: Diagnosis not present

## 2022-03-26 DIAGNOSIS — F331 Major depressive disorder, recurrent, moderate: Secondary | ICD-10-CM | POA: Diagnosis not present

## 2022-04-07 DIAGNOSIS — Z419 Encounter for procedure for purposes other than remedying health state, unspecified: Secondary | ICD-10-CM | POA: Diagnosis not present

## 2022-04-13 ENCOUNTER — Other Ambulatory Visit: Payer: Self-pay | Admitting: Nurse Practitioner

## 2022-04-13 DIAGNOSIS — E038 Other specified hypothyroidism: Secondary | ICD-10-CM

## 2022-04-14 NOTE — Telephone Encounter (Signed)
Requested Prescriptions  Pending Prescriptions Disp Refills  . ARMOUR THYROID 90 MG tablet [Pharmacy Med Name: ARMOUR THYROID 90 MG TAB] 90 tablet 0    Sig: TAKE 1 TABLET BY MOUTH ONCE A DAY     Endocrinology:  Hypothyroid Agents Passed - 04/13/2022  4:40 PM      Passed - TSH in normal range and within 360 days    TSH  Date Value Ref Range Status  01/09/2022 2.97 mIU/L Final    Comment:              Reference Range .           > or = 20 Years  0.40-4.50 .                Pregnancy Ranges           First trimester    0.26-2.66           Second trimester   0.55-2.73           Third trimester    0.43-2.91          Passed - Valid encounter within last 12 months    Recent Outpatient Visits          4 months ago Viral upper respiratory tract infection   [facility] Bo Merino, FNP   8 months ago Viral upper respiratory tract infection   [facility] Bo Merino, FNP   9 months ago Other specified hypothyroidism   [facility] Bo Merino, FNP   9 months ago Acute non-recurrent frontal sinusitis   [facility] Rory Percy M, DO   10 months ago Upper respiratory tract infection due to COVID-19 virus   [facility] Delsa Grana, PA-C             . ARMOUR THYROID 15 MG tablet [Pharmacy Med Name: ARMOUR THYROID 15 MG TAB] 90 tablet 0    Sig: TAKE 1 TABLET BY MOUTH ONCE A DAY     Endocrinology:  Hypothyroid Agents Passed - 04/13/2022  4:40 PM      Passed - TSH in normal range and within 360 days    TSH  Date Value Ref Range Status  01/09/2022 2.97 mIU/L Final    Comment:              Reference Range .           > or = 20 Years  0.40-4.50 .                Pregnancy Ranges           First trimester    0.26-2.66           Second trimester   0.55-2.73           Third trimester    0.43-2.91          Passed - Valid encounter within last 12 months    Recent  Outpatient Visits          4 months ago Viral upper respiratory tract infection   [place], FNP   8 months ago Viral upper respiratory tract infection   [place], FNP   9 months ago Other specified hypothyroidism   [facility] Bo Merino, FNP   9 months ago Acute non-recurrent frontal sinusitis   [facility]  [place], DO   10 months ago Upper respiratory tract infection due to COVID-19 virus   [facility] Delsa Grana, [place]

## 2022-04-15 ENCOUNTER — Encounter (INDEPENDENT_AMBULATORY_CARE_PROVIDER_SITE_OTHER): Payer: Self-pay

## 2022-04-28 ENCOUNTER — Ambulatory Visit (INDEPENDENT_AMBULATORY_CARE_PROVIDER_SITE_OTHER): Payer: Medicaid Other | Admitting: Nurse Practitioner

## 2022-04-28 ENCOUNTER — Encounter: Payer: Self-pay | Admitting: Nurse Practitioner

## 2022-04-28 ENCOUNTER — Other Ambulatory Visit: Payer: Self-pay

## 2022-04-28 VITALS — BP 120/80 | HR 97 | Temp 98.2°F | Resp 16 | Ht 61.0 in | Wt 165.5 lb

## 2022-04-28 DIAGNOSIS — J309 Allergic rhinitis, unspecified: Secondary | ICD-10-CM | POA: Diagnosis not present

## 2022-04-28 DIAGNOSIS — J4531 Mild persistent asthma with (acute) exacerbation: Secondary | ICD-10-CM | POA: Diagnosis not present

## 2022-04-28 DIAGNOSIS — E785 Hyperlipidemia, unspecified: Secondary | ICD-10-CM | POA: Diagnosis not present

## 2022-04-28 DIAGNOSIS — F3289 Other specified depressive episodes: Secondary | ICD-10-CM

## 2022-04-28 DIAGNOSIS — F321 Major depressive disorder, single episode, moderate: Secondary | ICD-10-CM | POA: Diagnosis not present

## 2022-04-28 DIAGNOSIS — D229 Melanocytic nevi, unspecified: Secondary | ICD-10-CM | POA: Diagnosis not present

## 2022-04-28 DIAGNOSIS — E890 Postprocedural hypothyroidism: Secondary | ICD-10-CM | POA: Diagnosis not present

## 2022-04-28 DIAGNOSIS — Z131 Encounter for screening for diabetes mellitus: Secondary | ICD-10-CM | POA: Diagnosis not present

## 2022-04-28 MED ORDER — MONTELUKAST SODIUM 5 MG PO CHEW: CHEWABLE_TABLET | ORAL | 3 refills | Status: DC

## 2022-04-28 NOTE — Assessment & Plan Note (Signed)
Patient is currently taking Cymbalta 60 mg daily.  Patient states she is doing well we will continue with current treatment.

## 2022-04-28 NOTE — Assessment & Plan Note (Signed)
Patient reports she is doing well.  She does have an albuterol inhaler if needed.  She does take Singulair 5 mg daily.

## 2022-04-28 NOTE — Assessment & Plan Note (Signed)
Patient's last LDL was 86 on 08/05/2021.  She is not currently on any medication.  We will get labs today.

## 2022-04-28 NOTE — Progress Notes (Signed)
BP 120/80   Pulse 97   Temp 98.2 F (36.8 C) (Oral)   Resp 16   Ht '5\' 1"'$  (1.549 m)   Wt 165 lb 8 oz (75.1 kg)   SpO2 98%   BMI 31.27 kg/m    Subjective:    Patient ID: [name], female    DOB: 1975/10/15, 46 y.o.   MRN: [id number]  HPI: [name] is a 46 y.o. female  Chief Complaint  Patient presents with   Thyroid Problem   Referral    Dermatology for mole removal   Hypothyroidism:  She is currently taking armour thyroid 105 mcg daily. Her last TSH was 2.97 on 01/09/2022.  Patient reports she has been having a lot more palpitations.  She thinks that her thyroid level may be out of range.  We will get labs.  Dyslipidemia:  Her last LDL was 86 on 08/05/2021. She is not on any cholesterol lower medication.  We will get labs.  Asthma/allergies: She currently uses albuterol inhaler as needed.  Patient states her breathing has been fine.  Patient states she needs a refill on her Singulair.  Refill sent.  Depression: Currently taking Cymbalta 60 mg daily.  Patient reports she is doing well.  We will continue with current treatment.    04/28/2022    3:21 PM 11/26/2021    1:52 PM 08/13/2021    1:39 PM 07/18/2021    2:37 PM 06/30/2021    4:18 PM  Depression screen PHQ 2/9  Decreased Interest 1 0 1 1 0  Down, Depressed, Hopeless 1 0 1 1 0  PHQ - 2 Score 2 0 2 2 0  Altered sleeping 0  0 2 0  Tired, decreased energy 0  0 1 0  Change in appetite 0  0 0 0  Feeling bad or failure about yourself  0  0 1 0  Trouble concentrating 0  0 1 0  Moving slowly or fidgety/restless 0  0 0 0  Suicidal thoughts 0  0 0 0  PHQ-9 Score '2  2 7 '$ 0  Difficult doing work/chores Not difficult at all  Not difficult at all Somewhat difficult Not difficult at all    Moles: Patient reports she has several moles she would like removed. Referral placed to dermatology.   Relevant past medical, surgical, family and social history reviewed and updated as indicated. Interim medical history since our last  visit reviewed. Allergies and medications reviewed and updated.  Review of Systems  Constitutional: Negative for fever or weight change.  Respiratory: Negative for cough and shortness of breath.   Cardiovascular: Negative for chest pain or palpitations.  Gastrointestinal: Negative for abdominal pain, no bowel changes.  Musculoskeletal: Negative for gait problem or joint swelling.  Skin: Negative for rash.  Positive for moles Neurological: Negative for dizziness or headache.  No other specific complaints in a complete review of systems (except as listed in HPI above).      Objective:    BP 120/80   Pulse 97   Temp 98.2 F (36.8 C) (Oral)   Resp 16   Ht '5\' 1"'$  (1.549 m)   Wt 165 lb 8 oz (75.1 kg)   SpO2 98%   BMI 31.27 kg/m   Wt Readings from Last 3 Encounters:  04/28/22 165 lb 8 oz (75.1 kg)  02/18/22 172 lb (78 kg)  12/15/21 189 lb (85.7 kg)    Physical Exam  Constitutional: Patient appears well-developed and well-nourished. Obese  No distress.  HEENT: head atraumatic, normocephalic, pupils equal and reactive to light, neck supple Cardiovascular: Normal rate, regular rhythm and normal heart sounds.  No murmur heard. No BLE edema. Pulmonary/Chest: Effort normal and breath sounds normal. No respiratory distress. Abdominal: Soft.  There is no tenderness. Psychiatric: Patient has a normal mood and affect. behavior is normal. Judgment and thought content normal.  Results for orders placed or performed in visit on 01/08/22  Zinc  Result Value Ref Range   Zinc 69 60 - 130 mcg/dL  Iron, TIBC and Ferritin Panel  Result Value Ref Range   Iron 81 40 - 190 mcg/dL   TIBC 292 250 - 450 mcg/dL (calc)   %SAT 28 16 - 45 % (calc)   Ferritin 142 16 - 232 ng/mL  TSH  Result Value Ref Range   TSH 2.97 mIU/L      Assessment & Plan:   Problem List Items Addressed This Visit       Respiratory   Allergic rhinitis   Relevant Medications   montelukast (SINGULAIR) 5 MG chewable  tablet   Asthma    Patient reports she is doing well.  She does have an albuterol inhaler if needed.  She does take Singulair 5 mg daily.      Relevant Medications   montelukast (SINGULAIR) 5 MG chewable tablet   Other Relevant Orders   CBC with Differential/Platelet   COMPLETE METABOLIC PANEL WITH GFR     Endocrine   Postoperative hypothyroidism - Primary    His last TSH was 2.97 on 01/09/2022.  Patient states she has been having more palpitations and thinks her thyroid level may be out of range.  We will get lab work.      Relevant Orders   TSH     Other   Depression    Patient is currently taking Cymbalta 60 mg daily.  Patient states she is doing well we will continue with current treatment.      Current moderate episode of major depressive disorder without prior episode ([place])   Dyslipidemia    Patient's last LDL was 86 on 08/05/2021.  She is not currently on any medication.  We will get labs today.      Relevant Orders   Lipid panel   Other Visit Diagnoses     Skin mole       Patient has a few moles she like to have removed.  Referral placed to dermatology.   Relevant Orders   Ambulatory referral to Dermatology   Screening for diabetes mellitus       Relevant Orders   Hemoglobin A1c        Follow up plan: Return in about 6 months (around 10/29/2022) for follow up.

## 2022-04-28 NOTE — Assessment & Plan Note (Signed)
His last TSH was 2.97 on 01/09/2022.  Patient states she has been having more palpitations and thinks her thyroid level may be out of range.  We will get lab work.

## 2022-04-29 ENCOUNTER — Ambulatory Visit (INDEPENDENT_AMBULATORY_CARE_PROVIDER_SITE_OTHER): Payer: Medicaid Other | Admitting: Family Medicine

## 2022-04-29 ENCOUNTER — Encounter (INDEPENDENT_AMBULATORY_CARE_PROVIDER_SITE_OTHER): Payer: Self-pay | Admitting: Family Medicine

## 2022-04-29 VITALS — BP 119/72 | HR 79 | Temp 97.9°F | Ht 61.0 in | Wt 161.0 lb

## 2022-04-29 DIAGNOSIS — Z6830 Body mass index (BMI) 30.0-30.9, adult: Secondary | ICD-10-CM

## 2022-04-29 DIAGNOSIS — R5383 Other fatigue: Secondary | ICD-10-CM

## 2022-04-29 DIAGNOSIS — E669 Obesity, unspecified: Secondary | ICD-10-CM | POA: Diagnosis not present

## 2022-04-30 DIAGNOSIS — F411 Generalized anxiety disorder: Secondary | ICD-10-CM | POA: Diagnosis not present

## 2022-04-30 DIAGNOSIS — F3341 Major depressive disorder, recurrent, in partial remission: Secondary | ICD-10-CM | POA: Diagnosis not present

## 2022-04-30 DIAGNOSIS — Z131 Encounter for screening for diabetes mellitus: Secondary | ICD-10-CM | POA: Diagnosis not present

## 2022-04-30 DIAGNOSIS — J4531 Mild persistent asthma with (acute) exacerbation: Secondary | ICD-10-CM | POA: Diagnosis not present

## 2022-04-30 DIAGNOSIS — E785 Hyperlipidemia, unspecified: Secondary | ICD-10-CM | POA: Diagnosis not present

## 2022-04-30 DIAGNOSIS — E890 Postprocedural hypothyroidism: Secondary | ICD-10-CM | POA: Diagnosis not present

## 2022-05-01 LAB — COMPLETE METABOLIC PANEL WITH GFR
AG Ratio: 1.8 (calc) (ref 1.0–2.5)
ALT: 15 U/L (ref 6–29)
AST: 15 U/L (ref 10–35)
Albumin: 4.1 g/dL (ref 3.6–5.1)
Alkaline phosphatase (APISO): 69 U/L (ref 31–125)
BUN: 17 mg/dL (ref 7–25)
CO2: 24 mmol/L (ref 20–32)
Calcium: 9.3 mg/dL (ref 8.6–10.2)
Chloride: 106 mmol/L (ref 98–110)
Creat: 0.52 mg/dL (ref 0.50–0.99)
Globulin: 2.3 g/dL (calc) (ref 1.9–3.7)
Glucose, Bld: 87 mg/dL (ref 65–99)
Potassium: 4.2 mmol/L (ref 3.5–5.3)
Sodium: 140 mmol/L (ref 135–146)
Total Bilirubin: 0.4 mg/dL (ref 0.2–1.2)
Total Protein: 6.4 g/dL (ref 6.1–8.1)
eGFR: 115 mL/min/{1.73_m2} (ref >=60)

## 2022-05-01 LAB — CBC WITH DIFFERENTIAL/PLATELET
Absolute Monocytes: 459 cells/uL (ref 200–950)
Basophils Absolute: 39 cells/uL (ref 0–200)
Basophils Relative: 0.7 %
Eosinophils Absolute: 112 cells/uL (ref 15–500)
Eosinophils Relative: 2 %
HCT: 39.3 % (ref 35.0–45.0)
Hemoglobin: 13.4 g/dL (ref 11.7–15.5)
Lymphs Abs: 1630 cells/uL (ref 850–3900)
MCH: 30 pg (ref 27.0–33.0)
MCHC: 34.1 g/dL (ref 32.0–36.0)
MCV: 87.9 fL (ref 80.0–100.0)
MPV: 10.8 fL (ref 7.5–12.5)
Monocytes Relative: 8.2 %
Neutro Abs: 3360 cells/uL (ref 1500–7800)
Neutrophils Relative %: 60 %
Platelets: 218 10*3/uL (ref 140–400)
RBC: 4.47 10*6/uL (ref 3.80–5.10)
RDW: 12.2 % (ref 11.0–15.0)
Total Lymphocyte: 29.1 %
WBC: 5.6 10*3/uL (ref 3.8–10.8)

## 2022-05-01 LAB — HEMOGLOBIN A1C
Hgb A1c MFr Bld: 5.2 % of total Hgb (ref <5.7)
Mean Plasma Glucose: 103 mg/dL
eAG (mmol/L): 5.7 mmol/L

## 2022-05-01 LAB — LIPID PANEL
Cholesterol: 132 mg/dL (ref <200)
HDL: 49 mg/dL — ABNORMAL LOW (ref >=50)
LDL Cholesterol (Calc): 58 mg/dL (calc)
Non-HDL Cholesterol (Calc): 83 mg/dL (calc) (ref <130)
Total CHOL/HDL Ratio: 2.7 (calc) (ref <5.0)
Triglycerides: 177 mg/dL — ABNORMAL HIGH (ref <150)

## 2022-05-01 LAB — TSH: TSH: 1.03 mIU/L

## 2022-05-04 DIAGNOSIS — Z9884 Bariatric surgery status: Secondary | ICD-10-CM | POA: Diagnosis not present

## 2022-05-04 DIAGNOSIS — R634 Abnormal weight loss: Secondary | ICD-10-CM | POA: Diagnosis not present

## 2022-05-04 DIAGNOSIS — Z888 Allergy status to other drugs, medicaments and biological substances status: Secondary | ICD-10-CM | POA: Diagnosis not present

## 2022-05-04 DIAGNOSIS — R7303 Prediabetes: Secondary | ICD-10-CM | POA: Diagnosis not present

## 2022-05-04 DIAGNOSIS — Z6831 Body mass index (BMI) 31.0-31.9, adult: Secondary | ICD-10-CM | POA: Diagnosis not present

## 2022-05-04 DIAGNOSIS — G4733 Obstructive sleep apnea (adult) (pediatric): Secondary | ICD-10-CM | POA: Diagnosis not present

## 2022-05-04 DIAGNOSIS — E669 Obesity, unspecified: Secondary | ICD-10-CM | POA: Diagnosis not present

## 2022-05-04 DIAGNOSIS — E079 Disorder of thyroid, unspecified: Secondary | ICD-10-CM | POA: Diagnosis not present

## 2022-05-04 DIAGNOSIS — Z881 Allergy status to other antibiotic agents status: Secondary | ICD-10-CM | POA: Diagnosis not present

## 2022-05-07 NOTE — Progress Notes (Signed)
Chief Complaint:   OBESITY [name] is here to discuss her progress with her obesity treatment plan along with follow-up of her obesity related diagnoses. [name] is on practicing portion control and making smarter food choices, such as increasing vegetables and decreasing simple carbohydrates and states she is following her eating plan approximately 75-80% of the time. [name] states she is walking for 30 minutes 5-6 times per week.  Today's visit was #: 22 Starting weight: 292 lbs Starting date: 12/07/2017 Today's weight: 161 lbs Today's date: 04/29/2022 Total lbs lost to date: 131 Total lbs lost since last in-office visit: 9  Interim History: [name] continues to do well with weight loss. She is doing portion control and occasionally journaling, and she is trying to increase her protein. She will be traveling soon and she plans to be active. She is status post gastric bypass 1 year ago.   Subjective:   1. Other fatigue [name] notes a significant improvement in fatigue and activity. She needs her thyroid medications adjusted with her weight loss and she has an upcoming appointment to discuss that soon.   Assessment/Plan:   1. Other fatigue [name] continue to do well, and no further evaluation is needed.   2. Obesity, Current BMI 30.5 [name] is currently in the action stage of change. As such, her goal is to continue with weight loss efforts. She has agreed to practicing portion control and making smarter food choices, such as increasing vegetables and decreasing simple carbohydrates.   Exercise goals: As is.   Behavioral modification strategies: increasing lean protein intake, increasing water intake, and travel eating strategies.  [name] has agreed to follow-up with our clinic in 2 to 3 months. She was informed of the importance of frequent follow-up visits to maximize her success with intensive lifestyle modifications for her multiple health conditions.   Objective:   Blood pressure  119/72, pulse 79, temperature 97.9 F (36.6 C), height '5\' 1"'$  (1.549 m), weight 161 lb (73 kg), SpO2 97 %. Body mass index is 30.42 kg/m.  General: Cooperative, alert, well developed, in no acute distress. HEENT: Conjunctivae and lids unremarkable. Cardiovascular: Regular rhythm.  Lungs: Normal work of breathing. Neurologic: No focal deficits.   Lab Results  Component Value Date   CREATININE 0.52 04/30/2022   BUN 17 04/30/2022   NA 140 04/30/2022   K 4.2 04/30/2022   CL 106 04/30/2022   CO2 24 04/30/2022   Lab Results  Component Value Date   ALT 15 04/30/2022   AST 15 04/30/2022   ALKPHOS 98 08/05/2021   BILITOT 0.4 04/30/2022   Lab Results  Component Value Date   HGBA1C 5.2 04/30/2022   HGBA1C 5.5 08/05/2021   HGBA1C 5.8 (H) 03/26/2021   HGBA1C 5.7 (H) 07/22/2020   HGBA1C 5.7 (H) 12/07/2019   Lab Results  Component Value Date   INSULIN 10.6 08/05/2021   INSULIN 33.2 (H) 07/22/2020   INSULIN 24.7 12/07/2019   INSULIN 26.1 (H) 08/07/2019   INSULIN 29.5 (H) 08/29/2018   Lab Results  Component Value Date   TSH 1.03 04/30/2022   Lab Results  Component Value Date   CHOL 132 04/30/2022   HDL 49 (L) 04/30/2022   LDLCALC 58 04/30/2022   TRIG 177 (H) 04/30/2022   CHOLHDL 2.7 04/30/2022   Lab Results  Component Value Date   VD25OH 71.1 08/05/2021   VD25OH 50.4 07/22/2020   VD25OH 49.1 12/07/2019   Lab Results  Component Value Date   WBC 5.6 04/30/2022  HGB 13.4 04/30/2022   HCT 39.3 04/30/2022   MCV 87.9 04/30/2022   PLT 218 04/30/2022   Lab Results  Component Value Date   IRON 81 01/09/2022   TIBC 292 01/09/2022   FERRITIN 142 01/09/2022   Attestation Statements:   Reviewed by clinician on day of visit: allergies, medications, problem list, medical history, surgical history, family history, social history, and previous encounter notes.  Time spent on visit including pre-visit chart review and post-visit care and charting was 30 minutes.   I,  Trixie Dredge, am acting as transcriptionist for Dennard Nip, MD.  I have reviewed the above documentation for accuracy and completeness, and I agree with the above. -  Dennard Nip, MD

## 2022-05-08 DIAGNOSIS — Z419 Encounter for procedure for purposes other than remedying health state, unspecified: Secondary | ICD-10-CM | POA: Diagnosis not present

## 2022-05-18 ENCOUNTER — Encounter: Payer: Self-pay | Admitting: Nurse Practitioner

## 2022-05-18 DIAGNOSIS — Z01419 Encounter for gynecological examination (general) (routine) without abnormal findings: Secondary | ICD-10-CM | POA: Diagnosis not present

## 2022-05-18 DIAGNOSIS — Z6831 Body mass index (BMI) 31.0-31.9, adult: Secondary | ICD-10-CM | POA: Diagnosis not present

## 2022-05-18 DIAGNOSIS — Z1231 Encounter for screening mammogram for malignant neoplasm of breast: Secondary | ICD-10-CM | POA: Diagnosis not present

## 2022-05-19 ENCOUNTER — Other Ambulatory Visit: Payer: Self-pay | Admitting: Nurse Practitioner

## 2022-05-19 DIAGNOSIS — F321 Major depressive disorder, single episode, moderate: Secondary | ICD-10-CM

## 2022-05-19 MED ORDER — DULOXETINE HCL 60 MG PO CPEP: 60.0000 mg | ORAL_CAPSULE | Freq: Every day | ORAL | 3 refills | Status: DC

## 2022-05-21 ENCOUNTER — Telehealth: Payer: Self-pay | Admitting: Family Medicine

## 2022-05-21 NOTE — Telephone Encounter (Signed)
Called pt no answer left vm to return my call. Would like to know if she needs a different referral placed for another location. Under patient referral there is this noted: Note:   "Rejection Reason - Patient went elsewhere" Jenne [facility] said on May 05, 2022 4:26 PM   "Resolved - Spoke with pt she will request a referral to [facility] from her PCP" Jenne Campus said on May 05, 2022 4:25 PM   "Patient Information - We are not in network with pts insurance. Do they want to be self pay?" Jenne [facility] said on May 04, 2022 4:21 PM   "We are not in network with this pts insurance. Do they want to be self pay?" Jenne [facility] said on May 04, 2022 4:19 PM  I want to know if she got it resolved on her behalf or she needs [place] to place a different one.

## 2022-05-21 NOTE — Telephone Encounter (Signed)
[facility] is calling to report that their own is not in network with the pts insurance. Please advise with pt

## 2022-06-07 DIAGNOSIS — Z419 Encounter for procedure for purposes other than remedying health state, unspecified: Secondary | ICD-10-CM | POA: Diagnosis not present

## 2022-06-29 ENCOUNTER — Ambulatory Visit (INDEPENDENT_AMBULATORY_CARE_PROVIDER_SITE_OTHER): Payer: Medicaid Other | Admitting: Family Medicine

## 2022-06-29 ENCOUNTER — Encounter (INDEPENDENT_AMBULATORY_CARE_PROVIDER_SITE_OTHER): Payer: Self-pay | Admitting: Family Medicine

## 2022-06-29 VITALS — BP 112/78 | HR 71 | Temp 98.1°F | Ht 61.0 in | Wt 160.0 lb

## 2022-06-29 DIAGNOSIS — G4709 Other insomnia: Secondary | ICD-10-CM | POA: Diagnosis not present

## 2022-06-29 DIAGNOSIS — Z6841 Body Mass Index (BMI) 40.0 and over, adult: Secondary | ICD-10-CM

## 2022-07-05 NOTE — Progress Notes (Unsigned)
Chief Complaint:   OBESITY [name] is here to discuss her progress with her obesity treatment plan along with follow-up of her obesity related diagnoses. [name] is on practicing portion control and making smarter food choices, such as increasing vegetables and decreasing simple carbohydrates and states she is following her eating plan approximately 75% of the time. [name] states she is walking for 30 minutes 5 times per week.  Today's visit was #: 24 Starting weight: 292 lbs Starting date: 12/07/2017 Today's weight: 160 lbs Today's date: 06/29/2022 Total lbs lost to date: 132 Total lbs lost since last in-office visit: 1  Interim History: [name] is status post weight loss surgery approximately 14 months ago. She notes she can eat more volume and she is trying to meet her protein goals mostly through "real" food., but she is still supplementing with bars and powder. Her hunger is controlled, but she isn't feeling satisfied.   Subjective:   1. Other insomnia [name] is not sleeping well, and this is chronic. She notes falling asleep is difficult and staying asleep is a problem. She has a CPAP, but she hasn't been evaluated in approximately 8 years. She has lost a lot of weight sine then.   Assessment/Plan:   1. Other insomnia [name] was referred to Dr. Brett Fairy for a sleep evaluation.   - Ambulatory referral to Sleep Studies  2. Obesity, Current BMI 30.4 [name] is currently in the action stage of change. As such, her goal is to continue with weight loss efforts. She has agreed to practicing portion control and making smarter food choices, such as increasing vegetables and decreasing simple carbohydrates.   Exercise goals: As is.   Behavioral modification strategies: increasing lean protein intake and emotional eating strategies.  [name] has agreed to follow-up with our clinic in 4 to 6 weeks. She was informed of the importance of frequent follow-up visits to maximize her success with  intensive lifestyle modifications for her multiple health conditions.   Objective:   Blood pressure 112/78, pulse 71, temperature 98.1 F (36.7 C), height '5\' 1"'$  (1.549 m), weight 160 lb (72.6 kg), SpO2 98 %. Body mass index is 30.23 kg/m.  General: Cooperative, alert, well developed, in no acute distress. HEENT: Conjunctivae and lids unremarkable. Cardiovascular: Regular rhythm.  Lungs: Normal work of breathing. Neurologic: No focal deficits.   Lab Results  Component Value Date   CREATININE 0.52 04/30/2022   BUN 17 04/30/2022   NA 140 04/30/2022   K 4.2 04/30/2022   CL 106 04/30/2022   CO2 24 04/30/2022   Lab Results  Component Value Date   ALT 15 04/30/2022   AST 15 04/30/2022   ALKPHOS 98 08/05/2021   BILITOT 0.4 04/30/2022   Lab Results  Component Value Date   HGBA1C 5.2 04/30/2022   HGBA1C 5.5 08/05/2021   HGBA1C 5.8 (H) 03/26/2021   HGBA1C 5.7 (H) 07/22/2020   HGBA1C 5.7 (H) 12/07/2019   Lab Results  Component Value Date   INSULIN 10.6 08/05/2021   INSULIN 33.2 (H) 07/22/2020   INSULIN 24.7 12/07/2019   INSULIN 26.1 (H) 08/07/2019   INSULIN 29.5 (H) 08/29/2018   Lab Results  Component Value Date   TSH 1.03 04/30/2022   Lab Results  Component Value Date   CHOL 132 04/30/2022   HDL 49 (L) 04/30/2022   LDLCALC 58 04/30/2022   TRIG 177 (H) 04/30/2022   CHOLHDL 2.7 04/30/2022   Lab Results  Component Value Date   VD25OH 71.1 08/05/2021  VD25OH 50.4 07/22/2020   VD25OH 49.1 12/07/2019   Lab Results  Component Value Date   WBC 5.6 04/30/2022   HGB 13.4 04/30/2022   HCT 39.3 04/30/2022   MCV 87.9 04/30/2022   PLT 218 04/30/2022   Lab Results  Component Value Date   IRON 81 01/09/2022   TIBC 292 01/09/2022   FERRITIN 142 01/09/2022   Attestation Statements:   Reviewed by clinician on day of visit: allergies, medications, problem list, medical history, surgical history, family history, social history, and previous encounter notes.  Time  spent on visit including pre-visit chart review and post-visit care and charting was 30 minutes.   I, Trixie Dredge, am acting as transcriptionist for Dennard Nip, MD.  I have reviewed the above documentation for accuracy and completeness, and I agree with the above. -  Dennard Nip, MD

## 2022-07-06 ENCOUNTER — Encounter (INDEPENDENT_AMBULATORY_CARE_PROVIDER_SITE_OTHER): Payer: Self-pay | Admitting: Family Medicine

## 2022-07-08 DIAGNOSIS — Z419 Encounter for procedure for purposes other than remedying health state, unspecified: Secondary | ICD-10-CM | POA: Diagnosis not present

## 2022-07-09 ENCOUNTER — Other Ambulatory Visit: Payer: Self-pay | Admitting: Nurse Practitioner

## 2022-07-09 DIAGNOSIS — E038 Other specified hypothyroidism: Secondary | ICD-10-CM

## 2022-07-09 NOTE — Telephone Encounter (Signed)
Requested medications are due for refill today.  unsure  Requested medications are on the active medications list.  yes  Last refill. Both refilled 04/14/2022  Future visit scheduled.   no  Notes to clinic.  Unclear if pt is to take both doses of this medication.    Requested Prescriptions  Pending Prescriptions Disp Refills   ARMOUR THYROID 15 MG tablet [Pharmacy Med Name: ARMOUR THYROID 15 MG TAB] 90 tablet 0    Sig: TAKE 1 TABLET BY MOUTH ONCE A DAY     Endocrinology:  Hypothyroid Agents Passed - 07/09/2022  3:02 PM      Passed - TSH in normal range and within 360 days    TSH  Date Value Ref Range Status  04/30/2022 1.03 mIU/L Final    Comment:              Reference Range .           > or = 20 Years  0.40-4.50 .                Pregnancy Ranges           First trimester    0.26-2.66           Second trimester   0.55-2.73           Third trimester    0.43-2.91          Passed - Valid encounter within last 12 months    Recent Outpatient Visits           2 months ago Postoperative hypothyroidism   [facility] Bo Merino, FNP   7 months ago Viral upper respiratory tract infection   [facility] Bo Merino, FNP   11 months ago Viral upper respiratory tract infection   [facility] Bo Merino, FNP   11 months ago Other specified hypothyroidism   [facility] Bo Merino, FNP   1 year ago Acute non-recurrent frontal sinusitis   [place], [place]               ARMOUR THYROID 90 MG tablet [Pharmacy Med Name: ARMOUR THYROID 90 MG TAB] 90 tablet 0    Sig: TAKE 1 TABLET BY MOUTH ONCE A DAY     Endocrinology:  Hypothyroid Agents Passed - 07/09/2022  3:02 PM      Passed - TSH in normal range and within 360 days    TSH  Date Value Ref Range Status  04/30/2022 1.03 mIU/L Final    Comment:              Reference Range .           > or =  20 Years  0.40-4.50 .                Pregnancy Ranges           First trimester    0.26-2.66           Second trimester   0.55-2.73           Third trimester    0.43-2.91          Passed - Valid encounter within last 12 months    Recent Outpatient Visits           2 months ago Postoperative hypothyroidism   [place], FNP   7  months ago Viral upper respiratory tract infection   [facility] Bo Merino, FNP   11 months ago Viral upper respiratory tract infection   [facility] Bo Merino, FNP   11 months ago Other specified hypothyroidism   [facility] Bo Merino, FNP   1 year ago Acute non-recurrent frontal sinusitis   [facility] Myles Gip, [place]

## 2022-07-13 DIAGNOSIS — M7671 Peroneal tendinitis, right leg: Secondary | ICD-10-CM | POA: Diagnosis not present

## 2022-07-13 DIAGNOSIS — M7661 Achilles tendinitis, right leg: Secondary | ICD-10-CM | POA: Diagnosis not present

## 2022-07-13 DIAGNOSIS — E119 Type 2 diabetes mellitus without complications: Secondary | ICD-10-CM | POA: Diagnosis not present

## 2022-07-28 ENCOUNTER — Encounter: Payer: Self-pay | Admitting: Internal Medicine

## 2022-07-28 ENCOUNTER — Telehealth (INDEPENDENT_AMBULATORY_CARE_PROVIDER_SITE_OTHER): Payer: Medicaid Other | Admitting: Internal Medicine

## 2022-07-28 ENCOUNTER — Other Ambulatory Visit: Payer: Self-pay

## 2022-07-28 VITALS — Temp 98.9°F

## 2022-07-28 DIAGNOSIS — J029 Acute pharyngitis, unspecified: Secondary | ICD-10-CM | POA: Diagnosis not present

## 2022-07-28 DIAGNOSIS — R0981 Nasal congestion: Secondary | ICD-10-CM

## 2022-07-28 DIAGNOSIS — J069 Acute upper respiratory infection, unspecified: Secondary | ICD-10-CM | POA: Diagnosis not present

## 2022-07-28 LAB — POCT INFLUENZA A/B
Influenza A, POC: NEGATIVE
Influenza B, POC: NEGATIVE

## 2022-07-28 LAB — POCT RAPID STREP A (OFFICE): Rapid Strep A Screen: NEGATIVE

## 2022-07-28 MED ORDER — METHYLPREDNISOLONE 4 MG PO TBPK: ORAL_TABLET | ORAL | 0 refills | Status: DC

## 2022-07-28 NOTE — Progress Notes (Signed)
Virtual Visit via Video Note  I connected with [name] on 07/28/22 at  8:00 AM EST by a video enabled telemedicine application and verified that I am speaking with the correct person using two identifiers.  Location: Patient: Home Provider: [facility]   I discussed the limitations of evaluation and management by telemedicine and the availability of in person appointments. The patient expressed understanding and agreed to proceed.  History of Present Illness:  Patient is presenting via telemedicine for cough, congestion. Symptoms started 2-3 days.   URI Compliant:  -Worst symptom: -Fever: yes; highest 102-104 Sunday night, normal this morning  -Cough: no -Shortness of breath: no -Wheezing: no -Chest congestion: no -Nasal congestion: yes -Runny nose: no -Post nasal drip: no -Sore throat: yes -Sinus pressure: no -Face pain: no -Ear pain: no  -Ear pressure: yes bilateral -Vomiting: no -Fatigue: yes -Sick contacts: yes -Context: worse -Treatments attempted: Tylenol and Nightquil    Observations/Objective:  General: sick appearing, no acute distress ENT: conjunctiva normal appearing bilaterally, nasal congestion present Skin: no rashes, cyanosis or abnormal bruising noted Neuro: Answers all questions appropriately   Assessment and Plan:  1. Viral upper respiratory tract infection/Congestion of nasal sinus/Sore throat: Patient will come in today to be tested for COVID, flu and strep. In the meantime treat symptoms with Medrol dose pack, decongestant and Tylenol as needed for fever. Also discussed nasal saline and humidifier to prevent dryness.   - methylPREDNISolone (MEDROL DOSEPAK) 4 MG TBPK tablet; Day 1: Take 8 mg (2 tablets) before breakfast, 4 mg (1 tablet) after lunch, 4 mg (1 tablet) after supper, and 8 mg (2 tablets) at bedtime. Day 2:Take 4 mg (1 tablet) before breakfast, 4 mg (1 tablet) after lunch, 4 mg (1 tablet) after supper, and 8 mg (2 tablets) at bedtime. Day 3:  Take 4 mg (1 tablet) before breakfast, 4 mg (1 tablet) after lunch, 4 mg (1 tablet) after supper, and 4 mg (1 tablet) at bedtime. Day 4: Take 4 mg (1 tablet) before breakfast, 4 mg (1 tablet) after lunch, and 4 mg (1 tablet) at bedtime. Day 5: Take 4 mg (1 tablet) before breakfast and 4 mg (1 tablet) at bedtime. Day 6: Take 4 mg (1 tablet) before breakfast.  Dispense: 1 each; Refill: 0 - Novel Coronavirus, NAA ([facility]) - POCT Influenza A/B   Follow Up Instructions: as needed - follow up in person if symptoms worsen or fail to improve    I discussed the assessment and treatment plan with the patient. The patient was provided an opportunity to ask questions and all were answered. The patient agreed with the plan and demonstrated an understanding of the instructions.   The patient was advised to call back or seek an in-person evaluation if the symptoms worsen or if the condition fails to improve as anticipated.  I provided 12 minutes of non-face-to-face time during this encounter.   Teodora Medici, DO

## 2022-07-29 LAB — NOVEL CORONAVIRUS, NAA: SARS-CoV-2, NAA: NOT DETECTED

## 2022-07-30 ENCOUNTER — Encounter (INDEPENDENT_AMBULATORY_CARE_PROVIDER_SITE_OTHER): Payer: Self-pay | Admitting: Family Medicine

## 2022-08-01 ENCOUNTER — Encounter: Payer: Self-pay | Admitting: Nurse Practitioner

## 2022-08-03 ENCOUNTER — Ambulatory Visit (INDEPENDENT_AMBULATORY_CARE_PROVIDER_SITE_OTHER): Payer: Medicaid Other | Admitting: Family Medicine

## 2022-08-03 ENCOUNTER — Ambulatory Visit: Payer: Medicaid Other | Admitting: Nurse Practitioner

## 2022-08-03 NOTE — Telephone Encounter (Signed)
Appt has been cancelled.  

## 2022-08-07 DIAGNOSIS — Z419 Encounter for procedure for purposes other than remedying health state, unspecified: Secondary | ICD-10-CM | POA: Diagnosis not present

## 2022-08-10 DIAGNOSIS — M7671 Peroneal tendinitis, right leg: Secondary | ICD-10-CM | POA: Diagnosis not present

## 2022-08-10 DIAGNOSIS — E119 Type 2 diabetes mellitus without complications: Secondary | ICD-10-CM | POA: Diagnosis not present

## 2022-08-10 DIAGNOSIS — M7661 Achilles tendinitis, right leg: Secondary | ICD-10-CM | POA: Diagnosis not present

## 2022-08-20 ENCOUNTER — Ambulatory Visit: Payer: Medicaid Other | Admitting: Nurse Practitioner

## 2022-08-20 ENCOUNTER — Other Ambulatory Visit: Payer: Self-pay

## 2022-08-20 ENCOUNTER — Encounter: Payer: Self-pay | Admitting: Nurse Practitioner

## 2022-08-20 VITALS — BP 120/80 | HR 80 | Temp 98.0°F | Resp 16 | Ht 61.0 in | Wt 160.4 lb

## 2022-08-20 DIAGNOSIS — E890 Postprocedural hypothyroidism: Secondary | ICD-10-CM

## 2022-08-20 DIAGNOSIS — E509 Vitamin A deficiency, unspecified: Secondary | ICD-10-CM

## 2022-08-20 DIAGNOSIS — J069 Acute upper respiratory infection, unspecified: Secondary | ICD-10-CM | POA: Diagnosis not present

## 2022-08-20 MED ORDER — METHYLPREDNISOLONE 4 MG PO TBPK: ORAL_TABLET | ORAL | 0 refills | Status: DC

## 2022-08-20 MED ORDER — AZITHROMYCIN 250 MG PO TABS: ORAL_TABLET | ORAL | 0 refills | Status: AC

## 2022-08-20 NOTE — Progress Notes (Signed)
BP 120/80   Pulse 80   Temp 98 F (36.7 C) (Oral)   Resp 16   Ht '5\' 1"'$  (1.549 m)   Wt 160 lb 6.4 oz (72.8 kg)   SpO2 98%   BMI 30.31 kg/m    Subjective:    Patient ID: [name], female    DOB: 1976/04/21, 46 y.o.   MRN: [id number]  HPI: [name] is a 46 y.o. female  Chief Complaint  Patient presents with   Cough    Congestion, scratchy throat body aches for 6 days   URI:  patient reports symptoms started six days ago. She reports symptoms include nasal congestion, scratchy throat, body aches.  She denies any fever or shortness of breath. She is outside the treatment window for covid and flu will defer testing.  Recommend she take mucinex, flonase and zyrtec. Patient reports last time she got a prescription for steroids and they helped a little bit but then she had to go to urgent care and was given a z pack. Her symptoms are likely viral and antibiotics are not indicated. Patient reports that it is what made her feel better. Will send in antibiotics however recommend trying steroids and OTC treatments first if no improvement start antibiotics.   Relevant past medical, surgical, family and social history reviewed and updated as indicated. Interim medical history since our last visit reviewed. Allergies and medications reviewed and updated.  Review of Systems Constitutional: Negative for fever or weight change.  HEENT:positive for scratchy throat, nasal congestion Respiratory: Negative for cough and shortness of breath.   Cardiovascular: Negative for chest pain or palpitations.  Gastrointestinal: Negative for abdominal pain, no bowel changes.  Musculoskeletal: Negative for gait problem or joint swelling.  Skin: Negative for rash.  Neurological: Negative for dizziness or headache.  No other specific complaints in a complete review of systems (except as listed in HPI above).      Objective:    BP 120/80   Pulse 80   Temp 98 F (36.7 C) (Oral)   Resp 16   Ht '5\' 1"'$   (1.549 m)   Wt 160 lb 6.4 oz (72.8 kg)   SpO2 98%   BMI 30.31 kg/m   Wt Readings from Last 3 Encounters:  08/20/22 160 lb 6.4 oz (72.8 kg)  06/29/22 160 lb (72.6 kg)  04/29/22 161 lb (73 kg)    Physical Exam  Constitutional: Patient appears well-developed and well-nourished. Obese  No distress.  HEENT: head atraumatic, normocephalic, pupils equal and reactive to light, ears Tms clear, neck supple, throat within normal limits Cardiovascular: Normal rate, regular rhythm and normal heart sounds.  No murmur heard. No BLE edema. Pulmonary/Chest: Effort normal and breath sounds normal. No respiratory distress. Abdominal: Soft.  There is no tenderness. Psychiatric: Patient has a normal mood and affect. behavior is normal. Judgment and thought content normal.     Assessment & Plan:   Problem List Items Addressed This Visit       Endocrine   Postoperative hypothyroidism    Patient is doing well other than she is sick, she would like to have her thyroid level checked.       Relevant Orders   TSH   Other Visit Diagnoses     Viral upper respiratory tract infection    -  Primary   start zyrtec, flonase and mucinex, and steroid taper   Relevant Medications   methylPREDNISolone (MEDROL DOSEPAK) 4 MG TBPK tablet   azithromycin (ZITHROMAX)  250 MG tablet   Vitamin A deficiency       patient reports that she needs a vitamin a level, she was told that she has a deficiency   Relevant Orders   Vitamin A        Follow up plan: Return if symptoms worsen or fail to improve.

## 2022-08-20 NOTE — Assessment & Plan Note (Signed)
Patient is doing well other than she is sick, she would like to have her thyroid level checked.

## 2022-08-21 ENCOUNTER — Encounter: Payer: Self-pay | Admitting: Nurse Practitioner

## 2022-08-24 LAB — VITAMIN A: Vitamin A (Retinoic Acid): 24 ug/dL — ABNORMAL LOW (ref 38–98)

## 2022-08-24 LAB — TSH: TSH: 1.27 mIU/L

## 2022-08-25 ENCOUNTER — Encounter: Payer: Self-pay | Admitting: Nurse Practitioner

## 2022-09-07 DIAGNOSIS — Z419 Encounter for procedure for purposes other than remedying health state, unspecified: Secondary | ICD-10-CM | POA: Diagnosis not present

## 2022-09-23 ENCOUNTER — Encounter (INDEPENDENT_AMBULATORY_CARE_PROVIDER_SITE_OTHER): Payer: Self-pay | Admitting: Family Medicine

## 2022-09-23 ENCOUNTER — Ambulatory Visit (INDEPENDENT_AMBULATORY_CARE_PROVIDER_SITE_OTHER): Payer: Medicaid Other | Admitting: Family Medicine

## 2022-09-23 VITALS — BP 100/67 | HR 67 | Temp 98.0°F | Ht 61.0 in | Wt 158.0 lb

## 2022-09-23 DIAGNOSIS — Z6829 Body mass index (BMI) 29.0-29.9, adult: Secondary | ICD-10-CM | POA: Diagnosis not present

## 2022-09-23 DIAGNOSIS — M6284 Sarcopenia: Secondary | ICD-10-CM | POA: Diagnosis not present

## 2022-09-23 DIAGNOSIS — Z6841 Body Mass Index (BMI) 40.0 and over, adult: Secondary | ICD-10-CM

## 2022-09-23 DIAGNOSIS — E669 Obesity, unspecified: Secondary | ICD-10-CM

## 2022-09-29 NOTE — Progress Notes (Unsigned)
Chief Complaint:   OBESITY [name] is here to discuss her progress with her obesity treatment plan along with follow-up of her obesity related diagnoses. [name] is on {MWMwtlossportion/plan2:23431} and states she is following her eating plan approximately ***% of the time. [name] states she is *** *** minutes *** times per week.  Today's visit was #: *** Starting weight: *** Starting date: *** Today's weight: *** Today's date: 09/23/2022 Total lbs lost to date: *** Total lbs lost since last in-office visit: ***  Interim History: ***  Subjective:   1. Sarcopenia ***  Assessment/Plan:   1. Sarcopenia ***  2. Obesity, Current BMI 29.9 [name] is currently in the action stage of change. As such, her goal is to continue with weight loss efforts. She has agreed to following a lower carbohydrate, vegetable and lean protein rich diet plan.   Exercise goals: As is, add strengthening exercise and watch all pilates videos.   Behavioral modification strategies: increasing lean protein intake.  [name] has agreed to follow-up with our clinic in 3 months. She was informed of the importance of frequent follow-up visits to maximize her success with intensive lifestyle modifications for her multiple health conditions.   Objective:   Blood pressure 100/67, pulse 67, temperature 98 F (36.7 C), height '5\' 1"'$  (1.549 m), weight 158 lb (71.7 kg), SpO2 98 %. Body mass index is 29.85 kg/m.  General: Cooperative, alert, well developed, in no acute distress. HEENT: Conjunctivae and lids unremarkable. Cardiovascular: Regular rhythm.  Lungs: Normal work of breathing. Neurologic: No focal deficits.   Lab Results  Component Value Date   CREATININE 0.52 04/30/2022   BUN 17 04/30/2022   NA 140 04/30/2022   K 4.2 04/30/2022   CL 106 04/30/2022   CO2 24 04/30/2022   Lab Results  Component Value Date   ALT 15 04/30/2022   AST 15 04/30/2022   ALKPHOS 98 08/05/2021   BILITOT 0.4 04/30/2022    Lab Results  Component Value Date   HGBA1C 5.2 04/30/2022   HGBA1C 5.5 08/05/2021   HGBA1C 5.8 (H) 03/26/2021   HGBA1C 5.7 (H) 07/22/2020   HGBA1C 5.7 (H) 12/07/2019   Lab Results  Component Value Date   INSULIN 10.6 08/05/2021   INSULIN 33.2 (H) 07/22/2020   INSULIN 24.7 12/07/2019   INSULIN 26.1 (H) 08/07/2019   INSULIN 29.5 (H) 08/29/2018   Lab Results  Component Value Date   TSH 1.27 08/20/2022   Lab Results  Component Value Date   CHOL 132 04/30/2022   HDL 49 (L) 04/30/2022   LDLCALC 58 04/30/2022   TRIG 177 (H) 04/30/2022   CHOLHDL 2.7 04/30/2022   Lab Results  Component Value Date   VD25OH 71.1 08/05/2021   VD25OH 50.4 07/22/2020   VD25OH 49.1 12/07/2019   Lab Results  Component Value Date   WBC 5.6 04/30/2022   HGB 13.4 04/30/2022   HCT 39.3 04/30/2022   MCV 87.9 04/30/2022   PLT 218 04/30/2022   Lab Results  Component Value Date   IRON 81 01/09/2022   TIBC 292 01/09/2022   FERRITIN 142 01/09/2022   Attestation Statements:   Reviewed by clinician on day of visit: allergies, medications, problem list, medical history, surgical history, family history, social history, and previous encounter notes.  Time spent on visit including pre-visit chart review and post-visit care and charting was 35 minutes.   I, Trixie Dredge, am acting as transcriptionist for Dennard Nip, MD.  I have reviewed the above documentation for  accuracy and completeness, and I agree with the above. -  ***

## 2022-10-08 ENCOUNTER — Other Ambulatory Visit: Payer: Self-pay | Admitting: Nurse Practitioner

## 2022-10-08 DIAGNOSIS — E038 Other specified hypothyroidism: Secondary | ICD-10-CM

## 2022-10-08 DIAGNOSIS — Z419 Encounter for procedure for purposes other than remedying health state, unspecified: Secondary | ICD-10-CM | POA: Diagnosis not present

## 2022-10-08 NOTE — Telephone Encounter (Signed)
Requested Prescriptions  Pending Prescriptions Disp Refills   ARMOUR THYROID 15 MG tablet [Pharmacy Med Name: ARMOUR THYROID 15 MG TAB] 90 tablet 2    Sig: TAKE 1 TABLET BY MOUTH ONCE A DAY     Endocrinology:  Hypothyroid Agents Passed - 10/08/2022  8:57 AM      Passed - TSH in normal range and within 360 days    TSH  Date Value Ref Range Status  08/20/2022 1.27 mIU/L Final    Comment:              Reference Range .           > or = 20 Years  0.40-4.50 .                Pregnancy Ranges           First trimester    0.26-2.66           Second trimester   0.55-2.73           Third trimester    0.43-2.91          Passed - Valid encounter within last 12 months    Recent Outpatient Visits           1 month ago Viral upper respiratory tract infection   [facility] Serafina Royals F, FNP   2 months ago Viral upper respiratory tract infection   [place], DO   5 months ago Postoperative hypothyroidism   [facility] Bo Merino, FNP   10 months ago Viral upper respiratory tract infection   [facility] Bo Merino, FNP   1 year ago Viral upper respiratory tract infection   [facility] Bo Merino, FNP               ARMOUR THYROID 90 MG tablet [Pharmacy Med Name: ARMOUR THYROID 90 MG TAB] 90 tablet 2    Sig: TAKE 1 TABLET BY MOUTH ONCE A DAY     Endocrinology:  Hypothyroid Agents Passed - 10/08/2022  8:57 AM      Passed - TSH in normal range and within 360 days    TSH  Date Value Ref Range Status  08/20/2022 1.27 mIU/L Final    Comment:              Reference Range .           > or = 20 Years  0.40-4.50 .                Pregnancy Ranges           First trimester    0.26-2.66           Second trimester   0.55-2.73           Third trimester    0.43-2.91          Passed - Valid encounter within last 12  months    Recent Outpatient Visits           1 month ago Viral upper respiratory tract infection   [place], FNP   2 months ago Viral upper respiratory tract infection   [place], DO   5 months ago Postoperative hypothyroidism   [facility] Bo Merino, FNP   10 months ago Viral  upper respiratory tract infection   [place], FNP   1 year ago Viral upper respiratory tract infection   [facility] Bo Merino, [place]

## 2022-10-12 DIAGNOSIS — Z1321 Encounter for screening for nutritional disorder: Secondary | ICD-10-CM | POA: Diagnosis not present

## 2022-10-12 DIAGNOSIS — E079 Disorder of thyroid, unspecified: Secondary | ICD-10-CM | POA: Diagnosis not present

## 2022-10-12 DIAGNOSIS — Z9884 Bariatric surgery status: Secondary | ICD-10-CM | POA: Diagnosis not present

## 2022-10-12 DIAGNOSIS — R634 Abnormal weight loss: Secondary | ICD-10-CM | POA: Diagnosis not present

## 2022-10-12 DIAGNOSIS — Z6831 Body mass index (BMI) 31.0-31.9, adult: Secondary | ICD-10-CM | POA: Diagnosis not present

## 2022-10-30 DIAGNOSIS — Z713 Dietary counseling and surveillance: Secondary | ICD-10-CM | POA: Diagnosis not present

## 2022-10-30 DIAGNOSIS — Z9884 Bariatric surgery status: Secondary | ICD-10-CM | POA: Diagnosis not present

## 2022-10-30 DIAGNOSIS — Z6829 Body mass index (BMI) 29.0-29.9, adult: Secondary | ICD-10-CM | POA: Diagnosis not present

## 2022-10-30 DIAGNOSIS — Z09 Encounter for follow-up examination after completed treatment for conditions other than malignant neoplasm: Secondary | ICD-10-CM | POA: Diagnosis not present

## 2022-11-06 DIAGNOSIS — Z419 Encounter for procedure for purposes other than remedying health state, unspecified: Secondary | ICD-10-CM | POA: Diagnosis not present

## 2022-11-23 DIAGNOSIS — Z9884 Bariatric surgery status: Secondary | ICD-10-CM | POA: Diagnosis not present

## 2022-11-23 DIAGNOSIS — E678 Other specified hyperalimentation: Secondary | ICD-10-CM | POA: Diagnosis not present

## 2022-11-23 DIAGNOSIS — E509 Vitamin A deficiency, unspecified: Secondary | ICD-10-CM | POA: Diagnosis not present

## 2022-11-23 DIAGNOSIS — R790 Abnormal level of blood mineral: Secondary | ICD-10-CM | POA: Diagnosis not present

## 2022-12-01 ENCOUNTER — Encounter: Payer: Self-pay | Admitting: Physician Assistant

## 2022-12-01 ENCOUNTER — Ambulatory Visit: Payer: Medicaid Other | Admitting: Physician Assistant

## 2022-12-01 ENCOUNTER — Other Ambulatory Visit: Payer: Self-pay

## 2022-12-01 VITALS — BP 118/72 | HR 72 | Temp 97.8°F | Resp 18 | Ht 61.0 in | Wt 157.7 lb

## 2022-12-01 DIAGNOSIS — J069 Acute upper respiratory infection, unspecified: Secondary | ICD-10-CM | POA: Diagnosis not present

## 2022-12-01 MED ORDER — BENZONATATE 100 MG PO CAPS: 100.0000 mg | ORAL_CAPSULE | Freq: Two times a day (BID) | ORAL | 0 refills | Status: DC | PRN

## 2022-12-01 NOTE — Patient Instructions (Addendum)
Based on your described symptoms and the duration of symptoms it is likely that you have a viral upper respiratory infection (often called a "cold")  Symptoms can last for 3-10 days with lingering cough and intermittent symptoms lasting weeks after that.  The goal of treatment at this time is to reduce your symptoms and discomfort   I have sent in Tessalon pearls for you to take twice per day to help with your cough   You can use over the counter medications such as Dayquil/Nyquil, AlkaSeltzer formulations, etc to provide further relief of symptoms according to the manufacturer's instructions   You can use a nasal saline spray to help flush out your nasal passages  You can use a humidifier at night to help as well   I also recommend restarting your Flonase and Zyrtec to help prevent allergies from flaring    If your symptoms do not improve or become worse in the next 5-7 days please make an apt at the office so we can see you  Go to the ER if you begin to have more serious symptoms such as shortness of breath, trouble breathing, loss of consciousness, swelling around the eyes, high fever, severe lasting headaches, vision changes or neck pain/stiffness.   It was nice to meet you and I appreciate the opportunity to be involved in your care If you were satisfied with the care you received from me, I would greatly appreciate you saying so in the after-visit survey that is sent out following our visit.

## 2022-12-01 NOTE — Progress Notes (Unsigned)
Acute Office Visit   Patient: [name]   DOB: Mar 06, 1976   47 y.o. Female  MRN: [id number] Visit Date: 12/01/2022  Today's healthcare provider: Dani Gobble Kolleen Ochsner, PA-C  Introduced myself to the patient as a Journalist, newspaper and provided education on APPs in clinical practice.    Chief Complaint  Patient presents with   Generalized Body Aches    [place], nasal drainage for 6 days. Covid test negative   Subjective    HPI HPI     Generalized Body Aches    Additional comments: [place], nasal drainage for 6 days. Covid test negative      Last edited by Hollie Salk, [place] on 12/01/2022  1:51 PM.      URI type symptoms  Onset: sudden  Duration: ongoing since Wed Started with hoarse voice then progressed to post nasal drainage  Associated symptoms: body aches, post nasal drainage, scratchy throat, chills, dry coughing, ear fullness,   Interventions:Tylenol   Recent sick contacts: Her daughter had the flu a few weeks ago   COVID testing at home: She tested on Friday and it was negative  She is still taking Singulair but has not been using her Zyrtec or flonase lately She has not had to use her inhaler while feeling sick       Medications: Outpatient Medications Prior to Visit  Medication Sig   acetaminophen (TYLENOL) 325 MG tablet Take 650 mg by mouth every 6 (six) hours as needed.   albuterol (VENTOLIN HFA) 108 (90 Base) MCG/ACT inhaler Inhale 2 puffs into the lungs every 6 (six) hours as needed for wheezing or shortness of breath.   betamethasone dipropionate 0.05 % cream Apply topically 3 (three) times a week. 3 times weekly as needed for ear itching   cetirizine HCl (ZYRTEC) 5 MG/5ML SOLN Take 10 mLs (10 mg total) by mouth daily.   DULoxetine (CYMBALTA) 60 MG capsule Take 1 capsule (60 mg total) by mouth daily.   estradiol (VIVELLE-DOT) 0.05 MG/24HR patch Place 1 patch onto the skin 2 (two) times a week.   fluticasone (FLONASE) 50 MCG/ACT nasal spray Place 2 sprays  into both nostrils daily.   hydrOXYzine (ATARAX) 25 MG tablet Take 25 mg by mouth 3 (three) times daily.   montelukast (SINGULAIR) 5 MG chewable tablet CHEW 2 TABLETS BY MOUTH AT BEDTIME   Multiple Vitamins-Minerals (MULTIVITAMIN WITH MINERALS) tablet Take 1 tablet by mouth daily.   thyroid (ARMOUR THYROID) 15 MG tablet TAKE 1 TABLET BY MOUTH ONCE A DAY   thyroid (ARMOUR THYROID) 90 MG tablet TAKE 1 TABLET BY MOUTH ONCE A DAY   zolpidem (AMBIEN) 10 MG tablet Take 10 mg by mouth at bedtime as needed.   ondansetron (ZOFRAN-ODT) 4 MG disintegrating tablet Take 4 mg by mouth every 6 (six) hours as needed. (Patient not taking: Reported on 12/01/2022)   [DISCONTINUED] methylPREDNISolone (MEDROL DOSEPAK) 4 MG TBPK tablet Day 1: Take 8 mg (2 tablets) before breakfast, 4 mg (1 tablet) after lunch, 4 mg (1 tablet) after supper, and 8 mg (2 tablets) at bedtime. Day 2:Take 4 mg (1 tablet) before breakfast, 4 mg (1 tablet) after lunch, 4 mg (1 tablet) after supper, and 8 mg (2 tablets) at bedtime. Day 3: Take 4 mg (1 tablet) before breakfast, 4 mg (1 tablet) after lunch, 4 mg (1 tablet) after supper, and 4 mg (1 tablet) at bedtime. Day 4: Take 4 mg (1 tablet) before breakfast, 4 mg (1  tablet) after lunch, and 4 mg (1 tablet) at bedtime. Day 5: Take 4 mg (1 tablet) before breakfast and 4 mg (1 tablet) at bedtime. Day 6: Take 4 mg (1 tablet) before breakfast. (Patient not taking: Reported on 12/01/2022)   No facility-administered medications prior to visit.    Review of Systems  Constitutional:  Positive for chills and fatigue. Negative for fever.  HENT:  Positive for postnasal drip, rhinorrhea and sinus pressure. Negative for congestion, ear pain, sinus pain and sore throat.   Eyes:  Positive for itching. Negative for discharge, redness and visual disturbance.  Respiratory:  Positive for cough. Negative for chest tightness, shortness of breath and wheezing.   Gastrointestinal:  Negative for diarrhea, nausea and  vomiting.  Musculoskeletal:  Positive for myalgias.  Neurological:  Positive for headaches. Negative for dizziness and light-headedness.    {Labs  Heme  Chem  Endocrine  Serology  Results Review (optional):23779}   Objective    BP 118/72   Pulse 72   Temp 97.8 F (36.6 C) (Oral)   Resp 18   Ht 5\' 1"  (1.549 m)   Wt 157 lb 11.2 oz (71.5 kg)   SpO2 99%   BMI 29.80 kg/m  {Show previous vital signs (optional):23777}  Physical Exam Vitals reviewed.  Constitutional:      General: She is awake.     Appearance: Normal appearance. She is well-developed and well-groomed.  HENT:     Head: Normocephalic and atraumatic.     Right Ear: Hearing, tympanic membrane and ear canal normal.     Left Ear: Hearing, tympanic membrane and ear canal normal.     Mouth/Throat:     Lips: Pink.     Mouth: Mucous membranes are moist.     Pharynx: Oropharynx is clear. Uvula midline. No oropharyngeal exudate or posterior oropharyngeal erythema.  Eyes:     Extraocular Movements: Extraocular movements intact.     Conjunctiva/sclera: Conjunctivae normal.     Pupils: Pupils are equal, round, and reactive to light.  Cardiovascular:     Rate and Rhythm: Normal rate and regular rhythm.     Pulses: Normal pulses.          Radial pulses are 2+ on the right side and 2+ on the left side.     Heart sounds: Normal heart sounds. No murmur heard.    No friction rub. No gallop.  Pulmonary:     Effort: Pulmonary effort is normal.     Breath sounds: Normal breath sounds. No decreased air movement. No decreased breath sounds, wheezing, rhonchi or rales.  Musculoskeletal:     Cervical back: Normal range of motion.     Right lower leg: No edema.     Left lower leg: No edema.  Lymphadenopathy:     Head:     Right side of head: No submental, submandibular or preauricular adenopathy.     Left side of head: No submental, submandibular or preauricular adenopathy.     Cervical:     Right cervical: No superficial or  posterior cervical adenopathy.    Left cervical: No superficial or posterior cervical adenopathy.     Upper Body:     Right upper body: No supraclavicular adenopathy.     Left upper body: No supraclavicular adenopathy.  Neurological:     General: No focal deficit present.     Mental Status: She is alert and oriented to person, place, and time. Mental status is at baseline.  Psychiatric:  Mood and Affect: Mood normal.        Behavior: Behavior normal. Behavior is cooperative.        Thought Content: Thought content normal.        Judgment: Judgment normal.       No results found for any visits on 12/01/22.  Assessment & Plan      No follow-ups on file.      Problem List Items Addressed This Visit   None Visit Diagnoses     Viral upper respiratory tract infection    -  Primary        No follow-ups on file.   I, Zayneb Baucum E Inge Waldroup, PA-C, have reviewed all documentation for this visit. The documentation on 12/01/22 for the exam, diagnosis, procedures, and orders are all accurate and complete.   Talitha Givens, MHS, PA-C [facility]

## 2022-12-03 ENCOUNTER — Encounter: Payer: Self-pay | Admitting: Physician Assistant

## 2022-12-03 NOTE — Telephone Encounter (Signed)
Spoke to patient she stated not feeling any better, coughing up mucous,  facial pain and headache.

## 2022-12-04 ENCOUNTER — Telehealth: Payer: Medicaid Other | Admitting: Physician Assistant

## 2022-12-04 ENCOUNTER — Ambulatory Visit: Payer: Self-pay

## 2022-12-04 DIAGNOSIS — B9689 Other specified bacterial agents as the cause of diseases classified elsewhere: Secondary | ICD-10-CM

## 2022-12-04 DIAGNOSIS — J019 Acute sinusitis, unspecified: Secondary | ICD-10-CM

## 2022-12-04 MED ORDER — PROMETHAZINE-DM 6.25-15 MG/5ML PO SYRP: 5.0000 mL | ORAL_SOLUTION | Freq: Four times a day (QID) | ORAL | 0 refills | Status: DC | PRN

## 2022-12-04 MED ORDER — AMOXICILLIN-POT CLAVULANATE 875-125 MG PO TABS: 1.0000 | ORAL_TABLET | Freq: Two times a day (BID) | ORAL | 0 refills | Status: DC

## 2022-12-04 NOTE — Telephone Encounter (Signed)
  Chief Complaint: URI Symptoms: Cough with green phlegm,  green sinus drainage, Chills/sweating - no fever Frequency: before 3/26 Pertinent Negatives: Patient denies  Disposition: [] ED /[] Urgent Care (no appt availability in office) / [] Appointment(In office/virtual)/ [x]  [facility]/ [] Home Care/ [] Refused Recommended Disposition /[] [facility]/ []  Follow-up with PCP Additional Notes: Pt was seen in office on 3/26. Pt is feeling no better. Pt has requested additional medication from provider, but has not gotten a response. PT wants additional evaluation and treatment. Made VV appt.  Summary: green mucous and constant coughing   Patient called back stating she is still experiencing coughing with green mucous. She does not feel any better and is requesting the provider to contact her back to tell her what she needs to do to feel better     Reason for Disposition  [1] Sinus congestion as part of a cold AND [2] present < 10 days  Answer Assessment - Initial Assessment Questions 1. LOCATION: "Where does it hurt?"      Sinus 2. ONSET: "When did the sinus pain start?"  (e.g., hours, days)      Before 3/26 3. SEVERITY: "How bad is the pain?"   (Scale 1-10; mild, moderate or severe)   - MILD (1-3): doesn't interfere with normal activities    - MODERATE (4-7): interferes with normal activities (e.g., work or school) or awakens from sleep   - SEVERE (8-10): excruciating pain and patient unable to do any normal activities        mild 4. RECURRENT SYMPTOM: "Have you ever had sinus problems before?" If Yes, ask: "When was the last time?" and "What happened that time?"       5. NASAL CONGESTION: "Is the nose blocked?" If Yes, ask: "Can you open it or must you breathe through your mouth?"     no 6. NASAL DISCHARGE: "Do you have discharge from your nose?" If so ask, "What color?"     green 7. FEVER: "Do you have a fever?" If Yes, ask: "What is it, how was it measured, and when  did it start?"      Chills and sweating - no fever 8. OTHER SYMPTOMS: "Do you have any other symptoms?" (e.g., sore throat, cough, earache, difficulty breathing)     Cough, producing green phlegm  Protocols used: Sinus Pain or Congestion-A-AH

## 2022-12-04 NOTE — Patient Instructions (Signed)
[name], thank you for joining Mar Daring, PA-C for today's virtual visit.  While this provider is not your primary care provider (PCP), if your PCP is located in our provider database this encounter information will be shared with them immediately following your visit.   [facility] account gives you access to today's visit and all your visits, tests, and labs performed at [facility] " click here if you don't have a [facility] account or go to [email or web address]  Consent: (Patient) [name] provided verbal consent for this virtual visit at the beginning of the encounter.  Current Medications:  Current Outpatient Medications:    amoxicillin-clavulanate (AUGMENTIN) 875-125 MG tablet, Take 1 tablet by mouth 2 (two) times daily., Disp: 14 tablet, Rfl: 0   promethazine-dextromethorphan (PROMETHAZINE-DM) 6.25-15 MG/5ML syrup, Take 5 mLs by mouth 4 (four) times daily as needed., Disp: 118 mL, Rfl: 0   acetaminophen (TYLENOL) 325 MG tablet, Take 650 mg by mouth every 6 (six) hours as needed., Disp: , Rfl:    albuterol (VENTOLIN HFA) 108 (90 Base) MCG/ACT inhaler, Inhale 2 puffs into the lungs every 6 (six) hours as needed for wheezing or shortness of breath., Disp: 6.7 g, Rfl: 3   benzonatate (TESSALON) 100 MG capsule, Take 1 capsule (100 mg total) by mouth 2 (two) times daily as needed for cough., Disp: 20 capsule, Rfl: 0   betamethasone dipropionate 0.05 % cream, Apply topically 3 (three) times a week. 3 times weekly as needed for ear itching, Disp: 30 g, Rfl: 0   cetirizine HCl (ZYRTEC) 5 MG/5ML SOLN, Take 10 mLs (10 mg total) by mouth daily., Disp: 300 mL, Rfl: 1   DULoxetine (CYMBALTA) 60 MG capsule, Take 1 capsule (60 mg total) by mouth daily., Disp: 90 capsule, Rfl: 3   estradiol (VIVELLE-DOT) 0.05 MG/24HR patch, Place 1 patch onto the skin 2 (two) times a week., Disp: , Rfl:    fluticasone (FLONASE) 50 MCG/ACT nasal spray, Place 2 sprays  into both nostrils daily., Disp: 16 g, Rfl: 6   hydrOXYzine (ATARAX) 25 MG tablet, Take 25 mg by mouth 3 (three) times daily., Disp: , Rfl:    montelukast (SINGULAIR) 5 MG chewable tablet, CHEW 2 TABLETS BY MOUTH AT BEDTIME, Disp: 180 tablet, Rfl: 3   Multiple Vitamins-Minerals (MULTIVITAMIN WITH MINERALS) tablet, Take 1 tablet by mouth daily., Disp: , Rfl:    ondansetron (ZOFRAN-ODT) 4 MG disintegrating tablet, Take 4 mg by mouth every 6 (six) hours as needed. (Patient not taking: Reported on 12/01/2022), Disp: , Rfl:    thyroid (ARMOUR THYROID) 15 MG tablet, TAKE 1 TABLET BY MOUTH ONCE A DAY, Disp: 90 tablet, Rfl: 2   thyroid (ARMOUR THYROID) 90 MG tablet, TAKE 1 TABLET BY MOUTH ONCE A DAY, Disp: 90 tablet, Rfl: 2   zolpidem (AMBIEN) 10 MG tablet, Take 10 mg by mouth at bedtime as needed., Disp: , Rfl:    Medications ordered in this encounter:  Meds ordered this encounter  Medications   amoxicillin-clavulanate (AUGMENTIN) 875-125 MG tablet    Sig: Take 1 tablet by mouth 2 (two) times daily.    Dispense:  14 tablet    Refill:  0    Order Specific Question:   Supervising Provider    Answer:   Chase Picket [id number]   promethazine-dextromethorphan (PROMETHAZINE-DM) 6.25-15 MG/5ML syrup    Sig: Take 5 mLs by mouth 4 (four) times daily as needed.    Dispense:  118 mL  Refill:  0    Order Specific Question:   Supervising Provider    Answer:   Chase Picket [id number]     *If you need refills on other medications prior to your next appointment, please contact your pharmacy*  Follow-Up: Call back or seek an in-person evaluation if the symptoms worsen or if the condition fails to improve as anticipated.  [place] [phone]  Other Instructions  Sinus Infection, Adult A sinus infection, also called sinusitis, is inflammation of your sinuses. Sinuses are hollow spaces in the bones around your face. Your sinuses are located: Around your eyes. In the middle of  your forehead. Behind your nose. In your cheekbones. Mucus normally drains out of your sinuses. When your nasal tissues become inflamed or swollen, mucus can become trapped or blocked. This allows bacteria, viruses, and fungi to grow, which leads to infection. Most infections of the sinuses are caused by a virus. A sinus infection can develop quickly. It can last for up to 4 weeks (acute) or for more than 12 weeks (chronic). A sinus infection often develops after a cold. What are the causes? This condition is caused by anything that creates swelling in the sinuses or stops mucus from draining. This includes: Allergies. Asthma. Infection from bacteria or viruses. Deformities or blockages in your nose or sinuses. Abnormal growths in the nose (nasal polyps). Pollutants, such as chemicals or irritants in the air. Infection from fungi. This is rare. What increases the risk? You are more likely to develop this condition if you: Have a weak body defense system (immune system). Do a lot of swimming or diving. Overuse nasal sprays. Smoke. What are the signs or symptoms? The main symptoms of this condition are pain and a feeling of pressure around the affected sinuses. Other symptoms include: Stuffy nose or congestion that makes it difficult to breathe through your nose. Thick yellow or greenish drainage from your nose. Tenderness, swelling, and warmth over the affected sinuses. A cough that may get worse at night. Decreased sense of smell and taste. Extra mucus that collects in the throat or the back of the nose (postnasal drip) causing a sore throat or bad breath. Tiredness (fatigue). Fever. How is this diagnosed? This condition is diagnosed based on: Your symptoms. Your medical history. A physical exam. Tests to find out if your condition is acute or chronic. This may include: Checking your nose for nasal polyps. Viewing your sinuses using a device that has a light (endoscope). Testing  for allergies or bacteria. Imaging tests, such as an MRI or CT scan. In rare cases, a bone biopsy may be done to rule out more serious types of fungal sinus disease. How is this treated? Treatment for a sinus infection depends on the cause and whether your condition is chronic or acute. If caused by a virus, your symptoms should go away on their own within 10 days. You may be given medicines to relieve symptoms. They include: Medicines that shrink swollen nasal passages (decongestants). A spray that eases inflammation of the nostrils (topical intranasal corticosteroids). Rinses that help get rid of thick mucus in your nose (nasal saline washes). Medicines that treat allergies (antihistamines). Over-the-counter pain relievers. If caused by bacteria, your health care provider may recommend waiting to see if your symptoms improve. Most bacterial infections will get better without antibiotic medicine. You may be given antibiotics if you have: A severe infection. A weak immune system. If caused by narrow nasal passages or nasal polyps,  surgery may be needed. Follow these instructions at home: Medicines Take, use, or apply over-the-counter and prescription medicines only as told by your health care provider. These may include nasal sprays. If you were prescribed an antibiotic medicine, take it as told by your health care provider. Do not stop taking the antibiotic even if you start to feel better. Hydrate and humidify  Drink enough fluid to keep your urine pale yellow. Staying hydrated will help to thin your mucus. Use a cool mist humidifier to keep the humidity level in your home above 50%. Inhale steam for 10-15 minutes, 3-4 times a day, or as told by your health care provider. You can do this in the bathroom while a hot shower is running. Limit your exposure to cool or dry air. Rest Rest as much as possible. Sleep with your head raised (elevated). Make sure you get enough sleep each  night. General instructions  Apply a warm, moist washcloth to your face 3-4 times a day or as told by your health care provider. This will help with discomfort. Use nasal saline washes as often as told by your health care provider. Wash your hands often with soap and water to reduce your exposure to germs. If soap and water are not available, use hand sanitizer. Do not smoke. Avoid being around people who are smoking (secondhand smoke). Keep all follow-up visits. This is important. Contact a health care provider if: You have a fever. Your symptoms get worse. Your symptoms do not improve within 10 days. Get help right away if: You have a severe headache. You have persistent vomiting. You have severe pain or swelling around your face or eyes. You have vision problems. You develop confusion. Your neck is stiff. You have trouble breathing. These symptoms may be an emergency. Get help right away. Call 911. Do not wait to see if the symptoms will go away. Do not drive yourself to the hospital. Summary A sinus infection is soreness and inflammation of your sinuses. Sinuses are hollow spaces in the bones around your face. This condition is caused by nasal tissues that become inflamed or swollen. The swelling traps or blocks the flow of mucus. This allows bacteria, viruses, and fungi to grow, which leads to infection. If you were prescribed an antibiotic medicine, take it as told by your health care provider. Do not stop taking the antibiotic even if you start to feel better. Keep all follow-up visits. This is important. This information is not intended to replace advice given to you by your health care provider. Make sure you discuss any questions you have with your health care provider. Document Revised: 07/29/2021 Document Reviewed: 07/29/2021 Elsevier Patient Education  [place].    If you have been instructed to have an in-person evaluation today at a local Urgent Care  facility, please use the link below. It will take you to a list of all of our available [facility], including address, phone number and hours of operation. Please do not delay care.  [facility]  If you or a family member do not have a primary care provider, use the link below to schedule a visit and establish care. When you choose a [facility] primary care physician or advanced practice provider, you gain a long-term partner in health. Find a Primary Care Provider  Learn more about [facility]'s in-office and virtual care options: [facility]

## 2022-12-04 NOTE — Progress Notes (Signed)
Virtual Visit Consent   [name], you are scheduled for a virtual visit with a [facility] provider today. Just as with appointments in the office, your consent must be obtained to participate. Your consent will be active for this visit and any virtual visit you may have with one of our providers in the next 365 days. If you have a MyChart account, a copy of this consent can be sent to you electronically.  As this is a virtual visit, video technology does not allow for your provider to perform a traditional examination. This may limit your provider's ability to fully assess your condition. If your provider identifies any concerns that need to be evaluated in person or the need to arrange testing (such as labs, EKG, etc.), we will make arrangements to do so. Although advances in technology are sophisticated, we cannot ensure that it will always work on either your end or our end. If the connection with a video visit is poor, the visit may have to be switched to a telephone visit. With either a video or telephone visit, we are not always able to ensure that we have a secure connection.  By engaging in this virtual visit, you consent to the provision of healthcare and authorize for your insurance to be billed (if applicable) for the services provided during this visit. Depending on your insurance coverage, you may receive a charge related to this service.  I need to obtain your verbal consent now. Are you willing to proceed with your visit today? [name] has provided verbal consent on 12/04/2022 for a virtual visit (video or telephone). Mar Daring, PA-C  Date: 12/04/2022 6:34 PM  Virtual Visit via Video Note   I, Mar Daring, connected with  [name]  ([id number], 1976-05-15) on 12/04/22 at  6:30 PM EDT by a video-enabled telemedicine application and verified that I am speaking with the correct person using two identifiers.  Location: Patient: Virtual Visit Location  Patient: Home Provider: Virtual Visit Location Provider: Home Office   I discussed the limitations of evaluation and management by telemedicine and the availability of in person appointments. The patient expressed understanding and agreed to proceed.    History of Present Illness: [name] is a 47 y.o. who identifies as a female who was assigned female at birth, and is being seen today for possible sinus infection.  HPI: Sinusitis This is a new problem. The current episode started 1 to 4 weeks ago. The problem has been gradually worsening since onset. There has been no fever (feels feverish but no fever). Associated symptoms include chills, congestion, coughing, headaches, a hoarse voice, sinus pressure and a sore throat (scratchy). (Rhinorrhea, post nasal drainage, body aches; worsening symptoms of sinus pressure and pain over last 2-3 days) Past treatments include acetaminophen and oral decongestants. The treatment provided no relief.      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   Primary osteoarthritis of left knee 03/19/2021   Tendonitis, Achilles, right 03/19/2021   DDD (degenerative disc disease), cervical 01/13/2021   Asthma 01/13/2021   Scoliosis deformity of spine 01/13/2021   Spinal stenosis of lumbar region 01/13/2021   Current moderate  episode of major depressive disorder without prior episode ([place]) 12/20/2019   Scapular dysfunction 12/20/2019   Chronic pain of right knee 12/20/2019   Depression 02/21/2019   Morbid obesity due to excess calories ([place]) 10/20/2018   Mild intermittent asthma with acute exacerbation 05/13/2018   Allergic rhinitis 03/09/2018   Tension headache  03/09/2018   Other fatigue 12/07/2017   Shortness of breath on exertion 12/07/2017   Other specified hypothyroidism 12/07/2017   Prediabetes 10/15/2017   Gastroesophageal reflux disease without esophagitis 10/15/2017   Nasal valve collapse 10/15/2017   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/01/2017   Chronic midline back pain 10/01/2017   Vitamin D deficiency 10/01/2017   Anxiety 10/01/2017   PCOS (polycystic ovarian syndrome) 10/01/2017   Severe episode of recurrent major depressive disorder, without psychotic features ([place]) 10/01/2017   BMI 50.0-59.9, adult ([place]) 10/01/2017   Fibromyalgia 10/01/2017   Postoperative hypothyroidism 10/01/2017   OSA (obstructive sleep apnea) 02/26/2014    Allergies:  Allergies  Allergen Reactions   Erythromycin    Erythromycin Base    Zyrtec [Cetirizine Hcl]     Only allergic to Zyrtec D -    Medications:  Current Outpatient Medications:    amoxicillin-clavulanate (AUGMENTIN) 875-125 MG tablet, Take 1 tablet by mouth 2 (two) times daily., Disp: 14 tablet, Rfl: 0   promethazine-dextromethorphan (PROMETHAZINE-DM) 6.25-15 MG/5ML syrup, Take 5 mLs by mouth 4 (four) times daily as needed., Disp: 118 mL, Rfl: 0   acetaminophen (TYLENOL) 325 MG tablet, Take 650 mg by mouth every 6 (six) hours as needed., Disp: , Rfl:    albuterol (VENTOLIN HFA) 108 (90 Base) MCG/ACT inhaler, Inhale 2 puffs into the lungs every 6 (six) hours as needed for wheezing or shortness of breath., Disp: 6.7 g, Rfl: 3   benzonatate (TESSALON) 100 MG capsule, Take 1 capsule (100 mg total) by mouth 2 (two) times daily as needed for cough., Disp: 20 capsule, Rfl: 0   betamethasone dipropionate 0.05 % cream, Apply topically 3 (three) times a week. 3 times weekly as needed for ear itching, Disp: 30 g, Rfl: 0   cetirizine HCl (ZYRTEC) 5 MG/5ML SOLN, Take 10 mLs (10 mg total) by mouth daily., Disp: 300 mL, Rfl: 1   DULoxetine (CYMBALTA) 60 MG capsule, Take 1 capsule (60 mg total) by mouth  daily., Disp: 90 capsule, Rfl: 3   estradiol (VIVELLE-DOT) 0.05 MG/24HR patch, Place 1 patch onto the skin 2 (two) times a week., Disp: , Rfl:    fluticasone (FLONASE) 50 MCG/ACT nasal spray, Place 2 sprays into both nostrils daily., Disp: 16 g, Rfl: 6   hydrOXYzine (ATARAX) 25 MG tablet, Take 25 mg by mouth 3 (three) times daily., Disp: , Rfl:    montelukast (SINGULAIR) 5 MG chewable tablet, CHEW 2 TABLETS BY MOUTH AT BEDTIME, Disp: 180 tablet, Rfl: 3   Multiple Vitamins-Minerals (MULTIVITAMIN WITH MINERALS) tablet, Take 1 tablet by mouth daily., Disp: , Rfl:    ondansetron (ZOFRAN-ODT) 4 MG disintegrating tablet, Take 4 mg by mouth every 6 (six) hours as needed. (Patient not taking: Reported on 12/01/2022), Disp: , Rfl:    thyroid (ARMOUR THYROID) 15 MG tablet, TAKE 1 TABLET BY MOUTH ONCE A DAY, Disp: 90 tablet, Rfl: 2   thyroid (ARMOUR THYROID) 90 MG tablet, TAKE 1 TABLET BY MOUTH ONCE A DAY, Disp: 90 tablet, Rfl: 2   zolpidem (AMBIEN) 10 MG tablet, Take 10 mg by mouth at bedtime as needed., Disp: , Rfl:   Observations/Objective: Patient is well-developed, well-nourished in  no acute distress.  Resting comfortably at home.  Head is normocephalic, atraumatic.  No labored breathing.  Speech is clear and coherent with logical content.  Patient is alert and oriented at baseline.    Assessment and Plan: 1. Acute bacterial sinusitis - amoxicillin-clavulanate (AUGMENTIN) 875-125 MG tablet; Take 1 tablet by mouth 2 (two) times daily.  Dispense: 14 tablet; Refill: 0 - promethazine-dextromethorphan (PROMETHAZINE-DM) 6.25-15 MG/5ML syrup; Take 5 mLs by mouth 4 (four) times daily as needed.  Dispense: 118 mL; Refill: 0  - Worsening symptoms that have not responded to OTC medications.  - Will give Augmentin and Promethazine DM for cough - Continue allergy medications.  - Steam and humidifier can help - Stay well hydrated and get plenty of rest.  - Seek in person evaluation if no symptom improvement  or if symptoms worsen   Follow Up Instructions: I discussed the assessment and treatment plan with the patient. The patient was provided an opportunity to ask questions and all were answered. The patient agreed with the plan and demonstrated an understanding of the instructions.  A copy of instructions were sent to the patient via MyChart unless otherwise noted below.    The patient was advised to call back or seek an in-person evaluation if the symptoms worsen or if the condition fails to improve as anticipated.  Time:  I spent 8 minutes with the patient via telehealth technology discussing the above problems/concerns.    Mar Daring, PA-C

## 2022-12-04 NOTE — Telephone Encounter (Signed)
Summary: green mucous and constant coughing   Patient called back stating she is still experiencing coughing with green mucous. She does not feel any better and is requesting the provider to contact her back to tell her what she needs to do to feel better     Call went to voice mail - mailbox is full unable to leave a message.

## 2022-12-07 DIAGNOSIS — Z419 Encounter for procedure for purposes other than remedying health state, unspecified: Secondary | ICD-10-CM | POA: Diagnosis not present

## 2022-12-07 NOTE — Telephone Encounter (Signed)
Called patient to see how she was feeling. She stated she was feeling a lot better after starting antibiotics

## 2022-12-14 ENCOUNTER — Telehealth: Payer: Self-pay | Admitting: Nurse Practitioner

## 2022-12-14 NOTE — Telephone Encounter (Signed)
Outreached to patient to schedule apt with PCP to follow up on DM, Gerd, Insulin resistance, mood and asthma for [facility] agenda gaps. Patient VM full-unable to leave VM. AS, CMA

## 2022-12-23 ENCOUNTER — Ambulatory Visit (INDEPENDENT_AMBULATORY_CARE_PROVIDER_SITE_OTHER): Payer: Medicaid Other | Admitting: Family Medicine

## 2022-12-23 ENCOUNTER — Encounter (INDEPENDENT_AMBULATORY_CARE_PROVIDER_SITE_OTHER): Payer: Self-pay | Admitting: Family Medicine

## 2022-12-23 VITALS — BP 101/66 | HR 71 | Temp 97.9°F | Ht 61.0 in | Wt 153.0 lb

## 2022-12-23 DIAGNOSIS — E669 Obesity, unspecified: Secondary | ICD-10-CM | POA: Diagnosis not present

## 2022-12-23 DIAGNOSIS — I10 Essential (primary) hypertension: Secondary | ICD-10-CM

## 2022-12-23 DIAGNOSIS — Z6828 Body mass index (BMI) 28.0-28.9, adult: Secondary | ICD-10-CM | POA: Diagnosis not present

## 2022-12-28 NOTE — Progress Notes (Unsigned)
Chief Complaint:   OBESITY [name] is here to discuss her progress with her obesity treatment plan along with follow-up of her obesity related diagnoses. [name] is on following a lower carbohydrate, vegetable and lean protein rich diet plan and states she is following her eating plan approximately 75% of the time. [name] states she is walking 5 times per week.    Today's visit was #: 60 Starting weight: 292 lbs Starting date: 12/07/2017 Today's weight: 153 lbs Today's date: 12/23/2022 Total lbs lost to date: 139 Total lbs lost since last in-office visit: 5  Interim History: [name] continues to do well with her weight loss. She is working on meeting her protein goals. Her hunger is controlled and she is working on increasing her activity.   Subjective:   1. Primary hypertension [name]'s blood pressure is well controlled with her diet and weight loss. She is not on medications and she has no signs of hypotension.   Assessment/Plan:   1. Primary hypertension [name] is to continue to work on maintaining her weight loss and hydration. She will watch for signs of hypotension, and we will follow-up in 3 months.   2. BMI 28.0-28.9,adult  3. Obesity, Beginning BMI 55.17 [name] is currently in the action stage of change. As such, her goal is to continue with weight loss efforts. She has agreed to keeping a food journal and adhering to recommended goals of 1200 calories and 80 grams of protein daily.   Exercise goals: As is.   Behavioral modification strategies: increasing lean protein intake, no skipping meals, and meal planning and cooking strategies.  [name] has agreed to follow-up with our clinic in 3 months. She was informed of the importance of frequent follow-up visits to maximize her success with intensive lifestyle modifications for her multiple health conditions.   Objective:   Blood pressure 101/66, pulse 71, temperature 97.9 F (36.6 C), height  (1.549 m), weight 153 lb (69.4  kg), SpO2 96 %. Body mass index is 28.91 kg/m.  Lab Results  Component Value Date   CREATININE 0.52 04/30/2022   BUN 17 04/30/2022   NA 140 04/30/2022   K 4.2 04/30/2022   CL 106 04/30/2022   CO2 24 04/30/2022   Lab Results  Component Value Date   ALT 15 04/30/2022   AST 15 04/30/2022   ALKPHOS 98 08/05/2021   BILITOT 0.4 04/30/2022   Lab Results  Component Value Date   HGBA1C 5.2 04/30/2022   HGBA1C 5.5 08/05/2021   HGBA1C 5.8 (H) 03/26/2021   HGBA1C 5.7 (H) 07/22/2020   HGBA1C 5.7 (H) 12/07/2019   Lab Results  Component Value Date   INSULIN 10.6 08/05/2021   INSULIN 33.2 (H) 07/22/2020   INSULIN 24.7 12/07/2019   INSULIN 26.1 (H) 08/07/2019   INSULIN 29.5 (H) 08/29/2018   Lab Results  Component Value Date   TSH 1.27 08/20/2022   Lab Results  Component Value Date   CHOL 132 04/30/2022   HDL 49 (L) 04/30/2022   LDLCALC 58 04/30/2022   TRIG 177 (H) 04/30/2022   CHOLHDL 2.7 04/30/2022   Lab Results  Component Value Date   VD25OH 71.1 08/05/2021   VD25OH 50.4 07/22/2020   VD25OH 49.1 12/07/2019   Lab Results  Component Value Date   WBC 5.6 04/30/2022   HGB 13.4 04/30/2022   HCT 39.3 04/30/2022   MCV 87.9 04/30/2022   PLT 218 04/30/2022   Lab Results  Component Value Date   IRON 81 01/09/2022  TIBC 292 01/09/2022   FERRITIN 142 01/09/2022   Attestation Statements:   Reviewed by clinician on day of visit: allergies, medications, problem list, medical history, surgical history, family history, social history, and previous encounter notes.  Time spent on visit including pre-visit chart review and post-visit care and charting was 22 minutes.   I, Burt Knack, am acting as transcriptionist for Quillian Quince, MD.  I have reviewed the above documentation for accuracy and completeness, and I agree with the above. -  Quillian Quince, MD

## 2023-01-06 DIAGNOSIS — Z419 Encounter for procedure for purposes other than remedying health state, unspecified: Secondary | ICD-10-CM | POA: Diagnosis not present

## 2023-02-06 DIAGNOSIS — Z419 Encounter for procedure for purposes other than remedying health state, unspecified: Secondary | ICD-10-CM | POA: Diagnosis not present

## 2023-03-08 DIAGNOSIS — Z419 Encounter for procedure for purposes other than remedying health state, unspecified: Secondary | ICD-10-CM | POA: Diagnosis not present

## 2023-03-17 ENCOUNTER — Ambulatory Visit (INDEPENDENT_AMBULATORY_CARE_PROVIDER_SITE_OTHER): Payer: Medicaid Other | Admitting: Family Medicine

## 2023-03-17 ENCOUNTER — Encounter (INDEPENDENT_AMBULATORY_CARE_PROVIDER_SITE_OTHER): Payer: Self-pay | Admitting: Family Medicine

## 2023-03-17 VITALS — BP 92/61 | HR 72 | Temp 98.2°F | Ht 61.0 in | Wt 153.0 lb

## 2023-03-17 DIAGNOSIS — Z6828 Body mass index (BMI) 28.0-28.9, adult: Secondary | ICD-10-CM

## 2023-03-17 DIAGNOSIS — Z9884 Bariatric surgery status: Secondary | ICD-10-CM | POA: Diagnosis not present

## 2023-03-17 DIAGNOSIS — E669 Obesity, unspecified: Secondary | ICD-10-CM

## 2023-03-17 DIAGNOSIS — E860 Dehydration: Secondary | ICD-10-CM | POA: Diagnosis not present

## 2023-03-22 NOTE — Progress Notes (Signed)
Chief Complaint:   OBESITY [name] is here to discuss her progress with her obesity treatment plan along with follow-up of her obesity related diagnoses. [name] is on keeping a food journal and adhering to recommended goals of 1200 calories and 80 grams of protein and states she is following her eating plan approximately 80% of the time. [name] states she is doing [employer] for 30 minutes 2 times per week.  Today's visit was #: 61 Starting weight: 292 lbs Starting date: 12/07/2017 Today's weight: 153 lbs Today's date: 03/17/2023 Total lbs lost to date: 139 Total lbs lost since last in-office visit: 0  Interim History: Patient has done well with maintaining her weight loss. She is working on increasing her hydration, but not always meeting her goals. Her hunger is mostly controlled, and she is trying to increase her activity.   Subjective:   1. S/P bariatric surgery Patient's surgeon asked for additional labs as her last Vit A was too low.   2. Dehydration Patient's blood pressure has  decreased. She is not on blood pressure medications. She is status post weight loss surgery. She sometimes feels lightheaded.   Assessment/Plan:   1. S/P bariatric surgery We will check labs today, and we will follow-up at patient's next visit.   - Vitamin A - Zinc - CMP14+EGFR  2. Dehydration Patient was encouraged to increase her calorie-free, caffeine-free liquids. She does not have to be only water. We will check CMP today.   3. BMI 28.0-28.9,adult  4. Obesity, Beginning BMI 55.17 [name] is currently in the action stage of change. As such, her goal is to continue with weight loss efforts. She has agreed to keeping a food journal and adhering to recommended goals of 1200 calories and 80+ grams of protein daily.   Exercise goals: As is.   Behavioral modification strategies: increasing water intake and no skipping meals.  [name] has agreed to follow-up with our clinic in 12 weeks. She  was informed of the importance of frequent follow-up visits to maximize her success with intensive lifestyle modifications for her multiple health conditions.   Objective:   Blood pressure 92/61, pulse 72, temperature 98.2 F (36.8 C), height 5\' 1"  (1.549 m), weight 153 lb (69.4 kg), SpO2 98%. Body mass index is 28.91 kg/m.  Lab Results  Component Value Date   CREATININE 0.52 04/30/2022   BUN 17 04/30/2022   NA 140 04/30/2022   K 4.2 04/30/2022   CL 106 04/30/2022   CO2 24 04/30/2022   Lab Results  Component Value Date   ALT 15 04/30/2022   AST 15 04/30/2022   ALKPHOS 98 08/05/2021   BILITOT 0.4 04/30/2022   Lab Results  Component Value Date   HGBA1C 5.2 04/30/2022   HGBA1C 5.5 08/05/2021   HGBA1C 5.8 (H) 03/26/2021   HGBA1C 5.7 (H) 07/22/2020   HGBA1C 5.7 (H) 12/07/2019   Lab Results  Component Value Date   INSULIN 10.6 08/05/2021   INSULIN 33.2 (H) 07/22/2020   INSULIN 24.7 12/07/2019   INSULIN 26.1 (H) 08/07/2019   INSULIN 29.5 (H) 08/29/2018   Lab Results  Component Value Date   TSH 1.27 08/20/2022   Lab Results  Component Value Date   CHOL 132 04/30/2022   HDL 49 (L) 04/30/2022   LDLCALC 58 04/30/2022   TRIG 177 (H) 04/30/2022   CHOLHDL 2.7 04/30/2022   Lab Results  Component Value Date   VD25OH 71.1 08/05/2021   VD25OH 50.4 07/22/2020   VD25OH  49.1 12/07/2019   Lab Results  Component Value Date   WBC 5.6 04/30/2022   HGB 13.4 04/30/2022   HCT 39.3 04/30/2022   MCV 87.9 04/30/2022   PLT 218 04/30/2022   Lab Results  Component Value Date   IRON 81 01/09/2022   TIBC 292 01/09/2022   FERRITIN 142 01/09/2022   Attestation Statements:   Reviewed by clinician on day of visit: allergies, medications, problem list, medical history, surgical history, family history, social history, and previous encounter notes.   I, Burt Knack, am acting as transcriptionist for Quillian Quince, MD.  I have reviewed the above documentation for accuracy and  completeness, and I agree with the above. -  Quillian Quince, MD

## 2023-03-23 DIAGNOSIS — Z9884 Bariatric surgery status: Secondary | ICD-10-CM | POA: Diagnosis not present

## 2023-03-26 LAB — ZINC: Zinc: 59 ug/dL (ref 44–115)

## 2023-03-26 LAB — CMP14+EGFR
ALT: 19 IU/L (ref 0–32)
AST: 21 IU/L (ref 0–40)
Albumin: 4.5 g/dL (ref 3.9–4.9)
Alkaline Phosphatase: 56 IU/L (ref 44–121)
BUN/Creatinine Ratio: 28 — ABNORMAL HIGH (ref 9–23)
BUN: 16 mg/dL (ref 6–24)
Bilirubin Total: 0.4 mg/dL (ref 0.0–1.2)
CO2: 26 mmol/L (ref 20–29)
Calcium: 9.5 mg/dL (ref 8.7–10.2)
Chloride: 101 mmol/L (ref 96–106)
Creatinine, Ser: 0.58 mg/dL (ref 0.57–1.00)
Globulin, Total: 2.3 g/dL (ref 1.5–4.5)
Glucose: 93 mg/dL (ref 70–99)
Potassium: 4.2 mmol/L (ref 3.5–5.2)
Sodium: 141 mmol/L (ref 134–144)
Total Protein: 6.8 g/dL (ref 6.0–8.5)
eGFR: 112 mL/min/{1.73_m2} (ref >59)

## 2023-03-26 LAB — VITAMIN A: Vitamin A: 33.5 ug/dL (ref 20.1–62.0)

## 2023-04-08 DIAGNOSIS — Z419 Encounter for procedure for purposes other than remedying health state, unspecified: Secondary | ICD-10-CM | POA: Diagnosis not present

## 2023-05-09 DIAGNOSIS — Z419 Encounter for procedure for purposes other than remedying health state, unspecified: Secondary | ICD-10-CM | POA: Diagnosis not present

## 2023-05-15 ENCOUNTER — Other Ambulatory Visit: Payer: Self-pay | Admitting: Nurse Practitioner

## 2023-05-15 DIAGNOSIS — F321 Major depressive disorder, single episode, moderate: Secondary | ICD-10-CM

## 2023-05-15 DIAGNOSIS — J309 Allergic rhinitis, unspecified: Secondary | ICD-10-CM

## 2023-05-17 DIAGNOSIS — Z9884 Bariatric surgery status: Secondary | ICD-10-CM | POA: Diagnosis not present

## 2023-05-17 DIAGNOSIS — Z09 Encounter for follow-up examination after completed treatment for conditions other than malignant neoplasm: Secondary | ICD-10-CM | POA: Diagnosis not present

## 2023-05-17 NOTE — Telephone Encounter (Signed)
Requested Prescriptions  Pending Prescriptions Disp Refills   montelukast (SINGULAIR) 5 MG chewable tablet [Pharmacy Med Name: MONTELUKAST SODIUM 5MG  TABLET CHEWABLE] 180 tablet 0    Sig: CHEW TWO TABLETS BY MOUTH AT BEDTIME     Pulmonology:  Leukotriene Inhibitors Passed - 05/15/2023 12:32 PM      Passed - Valid encounter within last 12 months    Recent Outpatient Visits           5 months ago Viral upper respiratory tract infection   [facility] Mecum, Erin E, PA-C   9 months ago Viral upper respiratory tract infection   [facility] Della Goo F, FNP   9 months ago Viral upper respiratory tract infection   [facility] Margarita Mail, DO   1 year ago Postoperative hypothyroidism   [facility] Berniece Salines, FNP   1 year ago Viral upper respiratory tract infection   [facility] Berniece Salines, FNP               DULoxetine (CYMBALTA) 60 MG capsule [Pharmacy Med Name: DULOXETINE HYDROCHLORIDE 60MG  CAPSULE DR PART] 90 capsule 0    Sig: TAKE ONE CAPSULE BY MOUTH DAILY     Psychiatry: Antidepressants - SNRI - duloxetine Passed - 05/15/2023 12:32 PM      Passed - Cr in normal range and within 360 days    Creat  Date Value Ref Range Status  04/30/2022 0.52 0.50 - 0.99 mg/dL Final   Creatinine, Ser  Date Value Ref Range Status  03/23/2023 0.58 0.57 - 1.00 mg/dL Final         Passed - eGFR is 30 or above and within 360 days    GFR, Est African American  Date Value Ref Range Status  10/01/2017 135 > OR = 60 mL/min/1.5m2 Final   GFR calc Af Amer  Date Value Ref Range Status  07/22/2020 121 >59 mL/min/1.73 Final    Comment:    **In accordance with recommendations from the NKF-ASN Task force,**   [facility] is in the process of updating its eGFR calculation to the   2021 CKD-EPI creatinine equation that estimates kidney function   without  a race variable.    GFR, Est Non African American  Date Value Ref Range Status  10/01/2017 116 > OR = 60 mL/min/1.66m2 Final   GFR calc non Af Amer  Date Value Ref Range Status  07/22/2020 105 >59 mL/min/1.73 Final   eGFR  Date Value Ref Range Status  03/23/2023 112 >59 mL/min/1.73 Final         Passed - Completed PHQ-2 or PHQ-9 in the last 360 days      Passed - Last BP in normal range    BP Readings from Last 1 Encounters:  03/17/23 92/61         Passed - Valid encounter within last 6 months    Recent Outpatient Visits           5 months ago Viral upper respiratory tract infection   [facility] Mecum, Oswaldo Conroy, PA-C   9 months ago Viral upper respiratory tract infection   [facility] Berniece Salines, FNP   9 months ago Viral upper respiratory tract infection   [facility] Margarita Mail, DO   1 year ago Postoperative hypothyroidism   [facility] Berniece Salines,  FNP   1 year ago Viral upper respiratory tract infection   [facility] Berniece Salines, [place]

## 2023-05-24 DIAGNOSIS — Z6830 Body mass index (BMI) 30.0-30.9, adult: Secondary | ICD-10-CM | POA: Diagnosis not present

## 2023-05-24 DIAGNOSIS — E669 Obesity, unspecified: Secondary | ICD-10-CM | POA: Diagnosis not present

## 2023-05-24 DIAGNOSIS — Z9884 Bariatric surgery status: Secondary | ICD-10-CM | POA: Diagnosis not present

## 2023-06-17 ENCOUNTER — Ambulatory Visit (INDEPENDENT_AMBULATORY_CARE_PROVIDER_SITE_OTHER): Payer: Medicaid Other | Admitting: Family Medicine

## 2023-06-17 VITALS — BP 100/64 | HR 88 | Temp 98.2°F | Ht 61.0 in | Wt 153.0 lb

## 2023-06-17 DIAGNOSIS — E669 Obesity, unspecified: Secondary | ICD-10-CM | POA: Diagnosis not present

## 2023-06-17 DIAGNOSIS — R7303 Prediabetes: Secondary | ICD-10-CM | POA: Diagnosis not present

## 2023-06-17 DIAGNOSIS — Z9884 Bariatric surgery status: Secondary | ICD-10-CM

## 2023-06-17 DIAGNOSIS — E785 Hyperlipidemia, unspecified: Secondary | ICD-10-CM | POA: Diagnosis not present

## 2023-06-17 DIAGNOSIS — Z6828 Body mass index (BMI) 28.0-28.9, adult: Secondary | ICD-10-CM

## 2023-06-17 DIAGNOSIS — E559 Vitamin D deficiency, unspecified: Secondary | ICD-10-CM

## 2023-06-21 NOTE — Progress Notes (Unsigned)
Chief Complaint:   OBESITY [name] is here to discuss her progress with her obesity treatment plan along with follow-up of her obesity related diagnoses. [name] is on keeping a food journal and adhering to recommended goals of 1200 calories and 80+ grams of protein and states she is following her eating plan approximately 75-80% of the time. [name] states she is walking and doing aerobics for 30-40 minutes 5-6 times per week.  Today's visit was #: 62 Starting weight: 292 lbs Starting date: 12/07/2017 Today's weight: 153 lbs Today's date: 06/17/2023 Total lbs lost to date: 139 Total lbs lost since last in-office visit: 0  Interim History: Patient has done well with maintaining her weight. She continues to be mindful of her food choices, and her protein and vegetables. She is doing doing well with her exercise. She notes some increase in family stress recently.   Subjective:   1. S/P bariatric surgery Patient needs labs done for her bariatric surgeon and she asks if she can do them here.   2. Prediabetes Patient is working on her diet, exercise, and weight loss to prevent diabetes mellitus. She is due for labs.   3. Vitamin D deficiency Patient is on multivitamins, and she is due to have her Vitamin D checked.   4. Dyslipidemia Patient is working on her diet to help decrease cholesterol, and she is not on statin.   Assessment/Plan:   1. S/P bariatric surgery We will check labs today, and we will follow-up at patient's next visit in 2 months.   - Folate - Iron and TIBC - Ferritin - CMP14+EGFR - CBC with Differential/Platelet - Folate - VITAMIN D 25 Hydroxy (Vit-D Deficiency, Fractures) - TSH - Magnesium - Zinc - Vitamin A - Parathyroid hormone, intact (no Ca)  2. Prediabetes We will check labs today. Patient will continue her eating plan and exercise, and we will follow-up in 2 months.   - Vitamin B12 - Folate - Insulin, random - Hemoglobin A1c  3. Vitamin D  deficiency We will check labs today, and we will follow-up at patient's next visit in 2 months.   - VITAMIN D 25 Hydroxy (Vit-D Deficiency, Fractures)  4. Dyslipidemia We will check labs today. Patient will continue her low cholesterol diet, and we will follow-up in 2 months.   - Lipid Panel With LDL/HDL Ratio  5. BMI 28.0-28.9,adult  6. Obesity, Beginning BMI 55.17 [name] is currently in the action stage of change. As such, her goal is to continue with weight loss efforts. She has agreed to keeping a food journal and adhering to recommended goals of 1200 calories and 80+ grams of protein daily.   Exercise goals: As is.   Behavioral modification strategies: no skipping meals and meal planning and cooking strategies.  [name] has agreed to follow-up with our clinic in 2 months. She was informed of the importance of frequent follow-up visits to maximize her success with intensive lifestyle modifications for her multiple health conditions.   [name] was informed we would discuss her lab results at her next visit unless there is a critical issue that needs to be addressed sooner. [name] agreed to keep her next visit at the agreed upon time to discuss these results.  Objective:   Blood pressure 100/64, pulse 88, temperature 98.2 F (36.8 C), height 5\' 1"  (1.549 m), weight 153 lb (69.4 kg), SpO2 98%. Body mass index is 28.91 kg/m.  Lab Results  Component Value Date   CREATININE 0.58 03/23/2023  BUN 16 03/23/2023   NA 141 03/23/2023   K 4.2 03/23/2023   CL 101 03/23/2023   CO2 26 03/23/2023   Lab Results  Component Value Date   ALT 19 03/23/2023   AST 21 03/23/2023   ALKPHOS 56 03/23/2023   BILITOT 0.4 03/23/2023   Lab Results  Component Value Date   HGBA1C 5.2 04/30/2022   HGBA1C 5.5 08/05/2021   HGBA1C 5.8 (H) 03/26/2021   HGBA1C 5.7 (H) 07/22/2020   HGBA1C 5.7 (H) 12/07/2019   Lab Results  Component Value Date   INSULIN 10.6 08/05/2021   INSULIN 33.2 (H) 07/22/2020    INSULIN 24.7 12/07/2019   INSULIN 26.1 (H) 08/07/2019   INSULIN 29.5 (H) 08/29/2018   Lab Results  Component Value Date   TSH 1.27 08/20/2022   Lab Results  Component Value Date   CHOL 132 04/30/2022   HDL 49 (L) 04/30/2022   LDLCALC 58 04/30/2022   TRIG 177 (H) 04/30/2022   CHOLHDL 2.7 04/30/2022   Lab Results  Component Value Date   VD25OH 71.1 08/05/2021   VD25OH 50.4 07/22/2020   VD25OH 49.1 12/07/2019   Lab Results  Component Value Date   WBC 5.6 04/30/2022   HGB 13.4 04/30/2022   HCT 39.3 04/30/2022   MCV 87.9 04/30/2022   PLT 218 04/30/2022   Lab Results  Component Value Date   IRON 81 01/09/2022   TIBC 292 01/09/2022   FERRITIN 142 01/09/2022   Attestation Statements:   Reviewed by clinician on day of visit: allergies, medications, problem list, medical history, surgical history, family history, social history, and previous encounter notes.   I, Burt Knack, am acting as transcriptionist for Quillian Quince, MD.  I have reviewed the above documentation for accuracy and completeness, and I agree with the above. -  Quillian Quince, MD

## 2023-06-23 ENCOUNTER — Telehealth: Payer: Self-pay | Admitting: Nurse Practitioner

## 2023-06-23 NOTE — Telephone Encounter (Signed)
Opened in error

## 2023-06-25 ENCOUNTER — Ambulatory Visit: Payer: Medicaid Other | Admitting: Nurse Practitioner

## 2023-06-25 NOTE — Progress Notes (Deleted)
There were no vitals taken for this visit.   Subjective:    Patient ID: [name], female    DOB: June 20, 1976, 47 y.o.   MRN: [id number]  HPI: [name] is a 47 y.o. female  No chief complaint on file.  Hypothyroidism:  she is currently taking armor thyroid 105 mcg daily.  Her last TSH was 1.27 08/2022. Due for labs.   HLD:  -Medications: none -Patient is working on lifestyle modification -Last lipid panel:  Lipid Panel     Component Value Date/Time   CHOL 132 04/30/2022 1603   CHOL 151 08/05/2021 1438   TRIG 177 (H) 04/30/2022 1603   HDL 49 (L) 04/30/2022 1603   HDL 44 08/05/2021 1438   CHOLHDL 2.7 04/30/2022 1603   LDLCALC 58 04/30/2022 1603   LABVLDL 21 08/05/2021 1438   The 10-year ASCVD risk score (Arnett DK, et al., 2019) is: 0.8%   Values used to calculate the score:     Age: 11 years     Sex: Female     Is Non-Hispanic African American: No     Diabetic: Yes     Tobacco smoker: No     Systolic Blood Pressure: 100 mmHg     Is BP treated: No     HDL Cholesterol: 49 mg/dL     Total Cholesterol: 132 mg/dL    Asthma/allergies: She currently uses albuterol inhaler as needed.  Patient states her breathing has been fine.  No changes   Depression Medication cymbalta 60 mg daily Compliant *** Side effects *** PHQ9 *** GAD *** Therapy ***     12/01/2022    1:50 PM 08/20/2022    3:07 PM 07/28/2022    7:53 AM 04/28/2022    3:21 PM 11/26/2021    1:52 PM  Depression screen PHQ 2/9  Decreased Interest 0 0 0 1 0  Down, Depressed, Hopeless 0 0 0 1 0  PHQ - 2 Score 0 0 0 2 0  Altered sleeping    0   Tired, decreased energy    0   Change in appetite    0   Feeling bad or failure about yourself     0   Trouble concentrating    0   Moving slowly or fidgety/restless    0   Suicidal thoughts    0   PHQ-9 Score    2   Difficult doing work/chores    Not difficult at all        08/13/2021    1:39 PM 07/18/2021    2:37 PM 03/26/2021    1:54 PM 03/19/2021    11:01 AM  GAD 7 : Generalized Anxiety Score  Nervous, Anxious, on Edge 0 1 3 0  Control/stop worrying 0 1 2 0  Worry too much - different things 0 1 3 0  Trouble relaxing 0 0 3 0  Restless 0 0 1 0  Easily annoyed or irritable 0 1 2 0  Afraid - awful might happen 0 0 0 0  Total GAD 7 Score 0 4 14 0  Anxiety Difficulty Not difficult at all Not difficult at all Somewhat difficult      Relevant past medical, surgical, family and social history reviewed and updated as indicated. Interim medical history since our last visit reviewed. Allergies and medications reviewed and updated.  Review of Systems  Constitutional: Negative for fever or weight change.  Respiratory: Negative for cough and shortness of breath.  Cardiovascular: Negative for chest pain or palpitations.  Gastrointestinal: Negative for abdominal pain, no bowel changes.  Musculoskeletal: Negative for gait problem or joint swelling.  Skin: Negative for rash.  Neurological: Negative for dizziness or headache.  No other specific complaints in a complete review of systems (except as listed in HPI above).      Objective:    There were no vitals taken for this visit.  Wt Readings from Last 3 Encounters:  06/17/23 153 lb (69.4 kg)  03/17/23 153 lb (69.4 kg)  12/23/22 153 lb (69.4 kg)    Physical Exam  Constitutional: Patient appears well-developed and well-nourished. Obese *** No distress.  HEENT: head atraumatic, normocephalic, pupils equal and reactive to light, ears ***, neck supple, throat within normal limits Cardiovascular: Normal rate, regular rhythm and normal heart sounds.  No murmur heard. No BLE edema. Pulmonary/Chest: Effort normal and breath sounds normal. No respiratory distress. Abdominal: Soft.  There is no tenderness. Psychiatric: Patient has a normal mood and affect. behavior is normal. Judgment and thought content normal.  Results for orders placed or performed in visit on 03/17/23  Vitamin A  Result  Value Ref Range   Vitamin A 33.5 20.1 - 62.0 ug/dL  Zinc  Result Value Ref Range   Zinc 59 44 - 115 ug/dL  [id number]  Result Value Ref Range   Glucose 93 70 - 99 mg/dL   BUN 16 6 - 24 mg/dL   Creatinine, Ser 7.82 0.57 - 1.00 mg/dL   eGFR 956 >21 HY/QMV/7.84   BUN/Creatinine Ratio 28 (H) 9 - 23   Sodium 141 134 - 144 mmol/L   Potassium 4.2 3.5 - 5.2 mmol/L   Chloride 101 96 - 106 mmol/L   CO2 26 20 - 29 mmol/L   Calcium 9.5 8.7 - 10.2 mg/dL   Total Protein 6.8 6.0 - 8.5 g/dL   Albumin 4.5 3.9 - 4.9 g/dL   Globulin, Total 2.3 1.5 - 4.5 g/dL   Bilirubin Total 0.4 0.0 - 1.2 mg/dL   Alkaline Phosphatase 56 44 - 121 IU/L   AST 21 0 - 40 IU/L   ALT 19 0 - 32 IU/L      Assessment & Plan:   Problem List Items Addressed This Visit   None    Follow up plan: No follow-ups on file.

## 2023-06-28 ENCOUNTER — Encounter: Payer: Self-pay | Admitting: Nurse Practitioner

## 2023-06-28 ENCOUNTER — Other Ambulatory Visit: Payer: Self-pay

## 2023-06-28 ENCOUNTER — Ambulatory Visit (INDEPENDENT_AMBULATORY_CARE_PROVIDER_SITE_OTHER): Payer: Medicaid Other | Admitting: Nurse Practitioner

## 2023-06-28 VITALS — BP 120/72 | Temp 97.9°F | Resp 16 | Ht 61.0 in | Wt 157.3 lb

## 2023-06-28 DIAGNOSIS — Z9884 Bariatric surgery status: Secondary | ICD-10-CM | POA: Diagnosis not present

## 2023-06-28 DIAGNOSIS — J342 Deviated nasal septum: Secondary | ICD-10-CM | POA: Diagnosis not present

## 2023-06-28 DIAGNOSIS — R7303 Prediabetes: Secondary | ICD-10-CM | POA: Diagnosis not present

## 2023-06-28 DIAGNOSIS — J309 Allergic rhinitis, unspecified: Secondary | ICD-10-CM

## 2023-06-28 DIAGNOSIS — E785 Hyperlipidemia, unspecified: Secondary | ICD-10-CM | POA: Diagnosis not present

## 2023-06-28 MED ORDER — FEXOFENADINE HCL 180 MG PO TABS: 180.0000 mg | ORAL_TABLET | Freq: Every day | ORAL | 0 refills | Status: DC

## 2023-06-28 MED ORDER — FLUTICASONE PROPIONATE 50 MCG/ACT NA SUSP: 2.0000 | Freq: Every day | NASAL | 6 refills | Status: AC

## 2023-06-28 NOTE — Progress Notes (Signed)
BP 120/72   Temp 97.9 F (36.6 C) (Oral)   Resp 16   Ht 5\' 1"  (1.549 m)   Wt 157 lb 4.8 oz (71.4 kg)   BMI 29.72 kg/m    Subjective:    Patient ID: [name], female    DOB: 1975/12/25, 47 y.o.   MRN: [id number]  HPI: [name] is a 47 y.o. female  Chief Complaint  Patient presents with   Referral    ENT    ENT referral. Patient reports she has a deviated septum and is now having a hard time breathing through her nose. She says she previously saw ENT and would like to go back and see them again. Referral placed.  Allergies: patient reports that she continues to feel stuffy.  Continue Singulair.  Start Sports coach. Referral placed to ent.    Relevant past medical, surgical, family and social history reviewed and updated as indicated. Interim medical history since our last visit reviewed. Allergies and medications reviewed and updated.  Review of Systems  Ten systems reviewed and is negative except as mentioned in HPI       Objective:    BP 120/72   Temp 97.9 F (36.6 C) (Oral)   Resp 16   Ht 5\' 1"  (1.549 m)   Wt 157 lb 4.8 oz (71.4 kg)   BMI 29.72 kg/m   Wt Readings from Last 3 Encounters:  06/28/23 157 lb 4.8 oz (71.4 kg)  06/17/23 153 lb (69.4 kg)  03/17/23 153 lb (69.4 kg)    Physical Exam  Constitutional: Patient appears well-developed and well-nourished. No distress.  HEENT: head atraumatic, normocephalic, pupils equal and reactive to light, neck supple, narrow nasal passeges Cardiovascular: Normal rate, regular rhythm and normal heart sounds.  No murmur heard. No BLE edema. Pulmonary/Chest: Effort normal and breath sounds normal. No respiratory distress. Abdominal: Soft.  There is no tenderness. Psychiatric: Patient has a normal mood and affect. behavior is normal. Judgment and thought content normal.  Results for orders placed or performed in visit on 03/17/23  Vitamin A  Result Value Ref Range   Vitamin A 33.5 20.1 - 62.0 ug/dL  Zinc   Result Value Ref Range   Zinc 59 44 - 115 ug/dL  [id number]  Result Value Ref Range   Glucose 93 70 - 99 mg/dL   BUN 16 6 - 24 mg/dL   Creatinine, Ser 7.82 0.57 - 1.00 mg/dL   eGFR 956 >21 HY/QMV/7.84   BUN/Creatinine Ratio 28 (H) 9 - 23   Sodium 141 134 - 144 mmol/L   Potassium 4.2 3.5 - 5.2 mmol/L   Chloride 101 96 - 106 mmol/L   CO2 26 20 - 29 mmol/L   Calcium 9.5 8.7 - 10.2 mg/dL   Total Protein 6.8 6.0 - 8.5 g/dL   Albumin 4.5 3.9 - 4.9 g/dL   Globulin, Total 2.3 1.5 - 4.5 g/dL   Bilirubin Total 0.4 0.0 - 1.2 mg/dL   Alkaline Phosphatase 56 44 - 121 IU/L   AST 21 0 - 40 IU/L   ALT 19 0 - 32 IU/L      Assessment & Plan:   Problem List Items Addressed This Visit       Respiratory   Allergic rhinitis - Primary    patient reports that she continues to feel stuffy.  Continue Singulair.  Start Sports coach. Referral placed to ent.       Relevant Medications   fexofenadine (ALLEGRA)  180 MG tablet   fluticasone (FLONASE) 50 MCG/ACT nasal spray   Other Visit Diagnoses     Deviated septum       referral placed to ent   Relevant Orders   Ambulatory referral to ENT        Follow up plan: Return if symptoms worsen or fail to improve.

## 2023-06-28 NOTE — Assessment & Plan Note (Signed)
patient reports that she continues to feel stuffy.  Continue Singulair.  Start Sports coach. Referral placed to ent.

## 2023-07-02 LAB — CBC WITH DIFFERENTIAL/PLATELET
Basophils Absolute: 0 10*3/uL (ref 0.0–0.2)
Basos: 1 %
EOS (ABSOLUTE): 0.1 10*3/uL (ref 0.0–0.4)
Eos: 3 %
Hematocrit: 41.5 % (ref 34.0–46.6)
Hemoglobin: 13.7 g/dL (ref 11.1–15.9)
Immature Grans (Abs): 0 10*3/uL (ref 0.0–0.1)
Immature Granulocytes: 0 %
Lymphocytes Absolute: 1.3 10*3/uL (ref 0.7–3.1)
Lymphs: 42 %
MCH: 29.7 pg (ref 26.6–33.0)
MCHC: 33 g/dL (ref 31.5–35.7)
MCV: 90 fL (ref 79–97)
Monocytes Absolute: 0.3 10*3/uL (ref 0.1–0.9)
Monocytes: 8 %
Neutrophils Absolute: 1.4 10*3/uL (ref 1.4–7.0)
Neutrophils: 46 %
Platelets: 187 10*3/uL (ref 150–450)
RBC: 4.62 x10E6/uL (ref 3.77–5.28)
RDW: 11.7 % (ref 11.7–15.4)
WBC: 3.1 10*3/uL — ABNORMAL LOW (ref 3.4–10.8)

## 2023-07-02 LAB — CMP14+EGFR
ALT: 20 [IU]/L (ref 0–32)
AST: 22 [IU]/L (ref 0–40)
Albumin: 4.3 g/dL (ref 3.9–4.9)
Alkaline Phosphatase: 51 [IU]/L (ref 44–121)
BUN/Creatinine Ratio: 24 — ABNORMAL HIGH (ref 9–23)
BUN: 15 mg/dL (ref 6–24)
Bilirubin Total: 0.4 mg/dL (ref 0.0–1.2)
CO2: 26 mmol/L (ref 20–29)
Calcium: 9.4 mg/dL (ref 8.7–10.2)
Chloride: 101 mmol/L (ref 96–106)
Creatinine, Ser: 0.62 mg/dL (ref 0.57–1.00)
Globulin, Total: 2.4 g/dL (ref 1.5–4.5)
Glucose: 90 mg/dL (ref 70–99)
Potassium: 4 mmol/L (ref 3.5–5.2)
Sodium: 142 mmol/L (ref 134–144)
Total Protein: 6.7 g/dL (ref 6.0–8.5)
eGFR: 110 mL/min/{1.73_m2} (ref >59)

## 2023-07-02 LAB — LIPID PANEL WITH LDL/HDL RATIO
Cholesterol, Total: 140 mg/dL (ref 100–199)
HDL: 62 mg/dL (ref >39)
LDL Chol Calc (NIH): 64 mg/dL (ref 0–99)
LDL/HDL Ratio: 1 {ratio} (ref 0.0–3.2)
Triglycerides: 72 mg/dL (ref 0–149)
VLDL Cholesterol Cal: 14 mg/dL (ref 5–40)

## 2023-07-02 LAB — FOLATE: Folate: >20 ng/mL (ref >3.0)

## 2023-07-02 LAB — MAGNESIUM: Magnesium: 1.9 mg/dL (ref 1.6–2.3)

## 2023-07-02 LAB — ZINC: Zinc: 69 ug/dL (ref 44–115)

## 2023-07-02 LAB — HEMOGLOBIN A1C
Est. average glucose Bld gHb Est-mCnc: 111 mg/dL
Hgb A1c MFr Bld: 5.5 % (ref 4.8–5.6)

## 2023-07-02 LAB — SPECIMEN STATUS REPORT

## 2023-07-02 LAB — VITAMIN A: Vitamin A: 38.1 ug/dL (ref 20.1–62.0)

## 2023-07-02 LAB — VITAMIN D 25 HYDROXY (VIT D DEFICIENCY, FRACTURES): Vit D, 25-Hydroxy: 74.9 ng/mL (ref 30.0–100.0)

## 2023-07-02 LAB — PARATHYROID HORMONE, INTACT (NO CA): PTH: 24 pg/mL (ref 15–65)

## 2023-07-02 LAB — VITAMIN B12: Vitamin B-12: 1223 pg/mL (ref 232–1245)

## 2023-07-02 LAB — IRON AND TIBC
Iron Saturation: 37 % (ref 15–55)
Iron: 99 ug/dL (ref 27–159)
Total Iron Binding Capacity: 269 ug/dL (ref 250–450)
UIBC: 170 ug/dL (ref 131–425)

## 2023-07-02 LAB — INSULIN, RANDOM: INSULIN: 72.2 u[IU]/mL — ABNORMAL HIGH (ref 2.6–24.9)

## 2023-07-02 LAB — TSH: TSH: 5.32 u[IU]/mL — ABNORMAL HIGH (ref 0.450–4.500)

## 2023-07-02 LAB — FERRITIN: Ferritin: 211 ng/mL — ABNORMAL HIGH (ref 15–150)

## 2023-07-06 ENCOUNTER — Other Ambulatory Visit: Payer: Self-pay | Admitting: Nurse Practitioner

## 2023-07-06 DIAGNOSIS — E038 Other specified hypothyroidism: Secondary | ICD-10-CM

## 2023-07-07 NOTE — Telephone Encounter (Signed)
Requested Prescriptions  Pending Prescriptions Disp Refills   thyroid (ARMOUR THYROID) 15 MG tablet [Pharmacy Med Name: ARMOUR THYROID 15MG  TABLET] 90 tablet 0    Sig: TAKE ONE TABLET BY MOUTH ONCE A DAY     Endocrinology:  Hypothyroid Agents Failed - 07/06/2023 10:38 AM      Failed - TSH in normal range and within 360 days    TSH  Date Value Ref Range Status  06/28/2023 5.320 (H) 0.450 - 4.500 uIU/mL Final         Passed - Valid encounter within last 12 months    Recent Outpatient Visits           1 week ago Allergic rhinitis, unspecified seasonality, unspecified trigger   [facility] Berniece Salines, FNP   7 months ago Viral upper respiratory tract infection    [facility] M[facility]um, Erin E, PA-C   10 months ago Viral upper respiratory tract infection   [facility] Berniece Salines, FNP   11 months ago Viral upper respiratory tract infection   [facility] Margarita Mail, DO   1 year ago Postoperative hypothyroidism   [facility] Berniece Salines, FNP               thyroid (ARMOUR THYROID) 90 MG tablet [Pharmacy Med Name: ARMOUR THYROID 90MG  TABLET] 90 tablet 0    Sig: TAKE ONE TABLET BY MOUTH ONCE A DAY     Endocrinology:  Hypothyroid Agents Failed - 07/06/2023 10:38 AM      Failed - TSH in normal range and within 360 days    TSH  Date Value Ref Range Status  06/28/2023 5.320 (H) 0.450 - 4.500 uIU/mL Final         Passed - Valid encounter within last 12 months    Recent Outpatient Visits           1 week ago Allergic rhinitis, unspecified seasonality, unspecified trigger   [facility] Berniece Salines, FNP   7 months ago Viral upper respiratory tract infection    [facility] Mecum, Erin E, PA-C   10 months ago Viral upper respiratory tract infection   [facility] Berniece Salines, FNP   11 months ago Viral upper respiratory tract infection   [facility] Margarita Mail, DO   1 year ago Postoperative hypothyroidism   [facility] Berniece Salines, [place]

## 2023-07-09 DIAGNOSIS — Z419 Encounter for procedure for purposes other than remedying health state, unspecified: Secondary | ICD-10-CM | POA: Diagnosis not present

## 2023-07-19 DIAGNOSIS — J3489 Other specified disorders of nose and nasal sinuses: Secondary | ICD-10-CM | POA: Diagnosis not present

## 2023-07-19 DIAGNOSIS — J342 Deviated nasal septum: Secondary | ICD-10-CM | POA: Diagnosis not present

## 2023-07-19 DIAGNOSIS — J34829 Nasal valve collapse, unspecified: Secondary | ICD-10-CM | POA: Diagnosis not present

## 2023-07-19 DIAGNOSIS — J301 Allergic rhinitis due to pollen: Secondary | ICD-10-CM | POA: Diagnosis not present

## 2023-07-30 DIAGNOSIS — Z6830 Body mass index (BMI) 30.0-30.9, adult: Secondary | ICD-10-CM | POA: Diagnosis not present

## 2023-07-30 DIAGNOSIS — Z01419 Encounter for gynecological examination (general) (routine) without abnormal findings: Secondary | ICD-10-CM | POA: Diagnosis not present

## 2023-07-30 DIAGNOSIS — Z1231 Encounter for screening mammogram for malignant neoplasm of breast: Secondary | ICD-10-CM | POA: Diagnosis not present

## 2023-07-30 LAB — HM MAMMOGRAPHY

## 2023-08-08 DIAGNOSIS — Z419 Encounter for procedure for purposes other than remedying health state, unspecified: Secondary | ICD-10-CM | POA: Diagnosis not present

## 2023-08-16 ENCOUNTER — Other Ambulatory Visit: Payer: Self-pay | Admitting: Nurse Practitioner

## 2023-08-16 ENCOUNTER — Ambulatory Visit (INDEPENDENT_AMBULATORY_CARE_PROVIDER_SITE_OTHER): Payer: Medicaid Other | Admitting: Family Medicine

## 2023-08-16 ENCOUNTER — Encounter (INDEPENDENT_AMBULATORY_CARE_PROVIDER_SITE_OTHER): Payer: Self-pay | Admitting: Family Medicine

## 2023-08-16 VITALS — BP 93/70 | HR 76 | Temp 98.0°F | Ht 61.0 in | Wt 153.0 lb

## 2023-08-16 DIAGNOSIS — Z6829 Body mass index (BMI) 29.0-29.9, adult: Secondary | ICD-10-CM

## 2023-08-16 DIAGNOSIS — E860 Dehydration: Secondary | ICD-10-CM | POA: Diagnosis not present

## 2023-08-16 DIAGNOSIS — J309 Allergic rhinitis, unspecified: Secondary | ICD-10-CM

## 2023-08-16 DIAGNOSIS — R7989 Other specified abnormal findings of blood chemistry: Secondary | ICD-10-CM | POA: Diagnosis not present

## 2023-08-16 DIAGNOSIS — E669 Obesity, unspecified: Secondary | ICD-10-CM | POA: Diagnosis not present

## 2023-08-16 DIAGNOSIS — E88819 Insulin resistance, unspecified: Secondary | ICD-10-CM | POA: Diagnosis not present

## 2023-08-16 DIAGNOSIS — F321 Major depressive disorder, single episode, moderate: Secondary | ICD-10-CM

## 2023-08-16 NOTE — Progress Notes (Signed)
.smr  Office: [phone]  /  Fax: [phone]  WEIGHT SUMMARY AND BIOMETRICS  Anthropometric Measurements Height: 5\' 1"  (1.549 m) Weight: 153 lb (69.4 kg) BMI (Calculated): 28.92 Weight at Last Visit: 153 lb Weight Lost Since Last Visit: 0 Weight Gained Since Last Visit: 0 Starting Weight: 292 lb Total Weight Loss (lbs): 139 lb (63 kg)   Body Composition  Body Fat %: 41.2 % Fat Mass (lbs): 63.4 lbs Muscle Mass (lbs): 85.8 lbs Total Body Water (lbs): 69.8 lbs Visceral Fat Rating : 9   Other Clinical Data Fasting: no Labs: no Today's Visit #: 23 Starting Date: 12/07/17    Chief Complaint: OBESITY   History of Present Illness   The patient, with a history of obesity and recent bariatric surgery, has maintained weight loss over the past two months. She has been journaling her food intake, aiming for a calorie goal of 1200 and protein intake of 80 grams or more. She has also increased her physical activity, walking for 30-45 minutes four times a week.  Despite these positive changes, the patient has been experiencing episodes of nocturnal shakes and feelings of faintness. She has found that eating a small amount of food, such as [place] yogurt or half a banana with peanut butter, before bed helps to alleviate these symptoms.  The patient's recent labs showed a high insulin level despite normal glucose and A1c levels, suggesting that her pancreas is working hard to maintain blood sugar control. She also had slightly low white blood cells, which will be monitored in future labs.  In addition to her physical health, the patient has been dealing with high levels of stress due to family issues. She has previously seen a therapist and is currently looking for a new one. The patient acknowledges that her stress often manifests in her sleep and dreams, affecting her ability to stay asleep.  The patient's other lab results, including electrolytes, kidney and liver function,  cholesterol, iron studies, and vitamin levels, were mostly within normal ranges. Her blood pressure was also reported to be good. She is not currently on any medications.          PHYSICAL EXAM:  Blood pressure 93/70, pulse 76, temperature 98 F (36.7 C), height 5\' 1"  (1.549 m), weight 153 lb (69.4 kg), SpO2 99%. Body mass index is 28.91 kg/m.  DIAGNOSTIC DATA REVIEWED:  BMET    Component Value Date/Time   NA 142 06/28/2023 0000   K 4.0 06/28/2023 0000   CL 101 06/28/2023 0000   CO2 26 06/28/2023 0000   GLUCOSE 90 06/28/2023 0000   GLUCOSE 87 04/30/2022 1603   BUN 15 06/28/2023 0000   CREATININE 0.62 06/28/2023 0000   CREATININE 0.52 04/30/2022 1603   CALCIUM 9.4 06/28/2023 0000   GFRNONAA 105 07/22/2020 1059   GFRNONAA 116 10/01/2017 1041   GFRAA 121 07/22/2020 1059   GFRAA 135 10/01/2017 1041   Lab Results  Component Value Date   HGBA1C 5.5 06/28/2023   HGBA1C 6.0 (H) 10/01/2017   Lab Results  Component Value Date   INSULIN 72.2 (H) 06/28/2023   INSULIN 20.1 12/07/2017   Lab Results  Component Value Date   TSH 5.320 (H) 06/28/2023   CBC    Component Value Date/Time   WBC 3.1 (L) 06/28/2023 0000   WBC 5.6 04/30/2022 1603   RBC 4.62 06/28/2023 0000   RBC 4.47 04/30/2022 1603   HGB 13.7 06/28/2023 0000   HCT 41.5 06/28/2023 0000   PLT 187 06/28/2023  0000   MCV 90 06/28/2023 0000   MCH 29.7 06/28/2023 0000   MCH 30.0 04/30/2022 1603   MCHC 33.0 06/28/2023 0000   MCHC 34.1 04/30/2022 1603   RDW 11.7 06/28/2023 0000   Iron Studies    Component Value Date/Time   IRON 99 06/28/2023 0000   TIBC 269 06/28/2023 0000   FERRITIN 211 (H) 06/28/2023 0000   IRONPCTSAT 37 06/28/2023 0000   IRONPCTSAT 28 01/09/2022 1154   Lipid Panel     Component Value Date/Time   CHOL 140 06/28/2023 0000   TRIG 72 06/28/2023 0000   HDL 62 06/28/2023 0000   CHOLHDL 2.7 04/30/2022 1603   LDLCALC 64 06/28/2023 0000   LDLCALC 58 04/30/2022 1603   Hepatic Function Panel      Component Value Date/Time   PROT 6.7 06/28/2023 0000   ALBUMIN 4.3 06/28/2023 0000   AST 22 06/28/2023 0000   ALT 20 06/28/2023 0000   ALKPHOS 51 06/28/2023 0000   BILITOT 0.4 06/28/2023 0000      Component Value Date/Time   TSH 5.320 (H) 06/28/2023 0000   Nutritional Lab Results  Component Value Date   VD25OH 74.9 06/28/2023   VD25OH 71.1 08/05/2021   VD25OH 50.4 07/22/2020     Assessment and Plan    Obesity Post-bariatric surgery with successful weight maintenance over the last two months. Journaling 1200 calories and 80 grams of protein daily. Engaging in physical activity, walking 30-45 minutes four times per week. 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 Emphasized stress management to prevent weight gain and hypoglycemia. - Continue journaling calorie and protein intake - Increase water intake - Set reminders for regular hydration - Continue physical activity regimen - Seek counseling for stress management - Follow up in 2-3 months with fasting labs  Insulin Resistance Elevated insulin levels despite normal glucose and A1c, may be due to stress. This can contribute to weight gain and hypoglycemia. Advised avoiding simple carbohydrates and increasing protein intake. Emphasized stress management. - Avoid simple carbohydrates - Increase protein intake - Seek counseling for stress management - Monitor for hypoglycemia symptoms and manage with appropriate snacks before bed - Recheck insulin levels in 2-3 months with fasting labs  Dehydration Mild dehydration noted in labs, though improved. Reports difficulty maintaining consistent water intake. Discussed hydration strategies. - Increase water intake - Set reminders for regular hydration - Recheck hydration status in 2-3 months with fasting labs  Iron Deficiency Iron studies show slight improvement but still suboptimal. Reports low intake  of red meat. Discussed increasing intake of other iron-rich foods. - Increase intake of iron-rich foods such as chicken, pork, seafood, and iron-rich vegetables - Recheck iron levels in 2-3 months with fasting labs  Elevated TSH TSH levels slightly abnormal, possibly due to Armour thyroid. Requires further evaluation by primary care provider. Discussed potential dosage adjustment. - Consult primary care provider for further evaluation of TSH levels and potential adjustment of Armour thyroid dosage  General Health Maintenance Well-controlled blood pressure and normal vitamin levels. Reports stress affecting sleep and well-being. Discussed stress management and maintaining current vitamin intake. - Continue monitoring blood pressure - Seek counseling for stress management - Maintain current vitamin intake - Follow up in 2-3 months with fasting labs  Follow-up - Schedule follow-up appointment in 2-3 months with fasting labs.       She was informed of the importance of frequent follow up visits to maximize her success with intensive lifestyle modifications  for her multiple health conditions.    Quillian Quince, MD

## 2023-08-17 NOTE — Telephone Encounter (Signed)
Requested Prescriptions  Pending Prescriptions Disp Refills   montelukast (SINGULAIR) 5 MG chewable tablet [Pharmacy Med Name: MONTELUKAST SODIUM 5MG  TABLET CHEWABLE] 180 tablet 1    Sig: CHEW TWO TABLETS BY MOUTH AT BEDTIME     Pulmonology:  Leukotriene Inhibitors Passed - 08/16/2023  9:50 AM      Passed - Valid encounter within last 12 months    Recent Outpatient Visits           1 month ago Allergic rhinitis, unspecified seasonality, unspecified trigger   [place] [facility] Berniece Salines, FNP   8 months ago Viral upper respiratory tract infection   [place] [facility] Mecum, Oswaldo Conroy, PA-C   12 months ago Viral upper respiratory tract infection   [facility] Della Goo F, FNP   1 year ago Viral upper respiratory tract infection   [place] [facility] Margarita Mail, DO   1 year ago Postoperative hypothyroidism   [facility] Berniece Salines, FNP               DULoxetine (CYMBALTA) 60 MG capsule [Pharmacy Med Name: DULOXETINE HYDROCHLORIDE 60MG  CAPSULE DR PART] 90 capsule 1    Sig: TAKE ONE CAPSULE BY MOUTH DAILY     Psychiatry: Antidepressants - SNRI - duloxetine Passed - 08/16/2023  9:50 AM      Passed - Cr in normal range and within 360 days    Creat  Date Value Ref Range Status  04/30/2022 0.52 0.50 - 0.99 mg/dL Final   Creatinine, Ser  Date Value Ref Range Status  06/28/2023 0.62 0.57 - 1.00 mg/dL Final         Passed - eGFR is 30 or above and within 360 days    GFR, Est African American  Date Value Ref Range Status  10/01/2017 135 > OR = 60 mL/min/1.54m2 Final   GFR calc Af Amer  Date Value Ref Range Status  07/22/2020 121 >59 mL/min/1.73 Final    Comment:    **In accordance with recommendations from the NKF-ASN Task force,**   [facility] is in the process of updating its eGFR calculation to the   2021 CKD-EPI creatinine equation that estimates  kidney function   without a race variable.    GFR, Est Non African American  Date Value Ref Range Status  10/01/2017 116 > OR = 60 mL/min/1.71m2 Final   GFR calc non Af Amer  Date Value Ref Range Status  07/22/2020 105 >59 mL/min/1.73 Final   eGFR  Date Value Ref Range Status  06/28/2023 110 >59 mL/min/1.73 Final         Passed - Completed PHQ-2 or PHQ-9 in the last 360 days      Passed - Last BP in normal range    BP Readings from Last 1 Encounters:  08/16/23 93/70         Passed - Valid encounter within last 6 months    Recent Outpatient Visits           1 month ago Allergic rhinitis, unspecified seasonality, unspecified trigger   [place] [facility] Berniece Salines, FNP   8 months ago Viral upper respiratory tract infection   [facility] Mecum, Oswaldo Conroy, PA-C   12 months ago Viral upper respiratory tract infection   [facility] Della Goo F, FNP   1 year ago Viral upper respiratory tract infection  [facility] Margarita Mail, DO   1 year ago Postoperative hypothyroidism   [facility] Berniece Salines, [place]

## 2023-09-08 DIAGNOSIS — Z419 Encounter for procedure for purposes other than remedying health state, unspecified: Secondary | ICD-10-CM | POA: Diagnosis not present

## 2023-09-17 ENCOUNTER — Other Ambulatory Visit: Payer: Self-pay | Admitting: Nurse Practitioner

## 2023-09-17 DIAGNOSIS — J309 Allergic rhinitis, unspecified: Secondary | ICD-10-CM

## 2023-09-20 NOTE — Telephone Encounter (Signed)
 No longer on current medication list Requested Prescriptions  Pending Prescriptions Disp Refills   GNP ALLERGY RELIEF 180 MG tablet [Pharmacy Med Name: GNP ALLERGY RELIEF 180MG  TABLET] 90 tablet 0    Sig: TAKE ONE TABLET (180 MG TOTAL) BY MOUTH DAILY.     Ear, Nose, and Throat:  Antihistamines Passed - 09/20/2023  2:04 PM      Passed - Valid encounter within last 12 months    Recent Outpatient Visits           2 months ago Allergic rhinitis, unspecified seasonality, unspecified trigger   [place] [facility] Gareth Mliss FALCON, FNP   9 months ago Viral upper respiratory tract infection   [place] [facility] Mecum, Rocky BRAVO, PA-C   1 year ago Viral upper respiratory tract infection   [facility] Gareth Mliss FALCON, FNP   1 year ago Viral upper respiratory tract infection   [facility] Bernardo Fend, DO   1 year ago Postoperative hypothyroidism   [facility] Gareth Mliss FALCON, [place]

## 2023-09-27 ENCOUNTER — Other Ambulatory Visit: Payer: Self-pay | Admitting: Nurse Practitioner

## 2023-09-27 DIAGNOSIS — J309 Allergic rhinitis, unspecified: Secondary | ICD-10-CM

## 2023-09-27 MED ORDER — FEXOFENADINE HCL 180 MG PO TABS: 180.0000 mg | ORAL_TABLET | Freq: Every day | ORAL | 3 refills | Status: AC

## 2023-09-28 ENCOUNTER — Encounter: Payer: Self-pay | Admitting: Nurse Practitioner

## 2023-09-29 ENCOUNTER — Ambulatory Visit: Payer: Medicaid Other | Admitting: Nurse Practitioner

## 2023-09-29 NOTE — Progress Notes (Deleted)
There were no vitals taken for this visit.   Subjective:    Patient ID: [name], female    DOB: 1976/03/16, 48 y.o.   MRN: [id number]  HPI: [name] is a 48 y.o. female  No chief complaint on file.   Discussed the use of AI scribe software for clinical note transcription with the patient, who gave verbal consent to proceed.  History of Present Illness           06/28/2023    2:10 PM 12/01/2022    1:50 PM 08/20/2022    3:07 PM  Depression screen PHQ 2/9  Decreased Interest 0 0 0  Down, Depressed, Hopeless 0 0 0  PHQ - 2 Score 0 0 0    Relevant past medical, surgical, family and social history reviewed and updated as indicated. Interim medical history since our last visit reviewed. Allergies and medications reviewed and updated.  Review of Systems  Per HPI unless specifically indicated above     Objective:    There were no vitals taken for this visit.  {Vitals History (Optional):23777} Wt Readings from Last 3 Encounters:  08/16/23 153 lb (69.4 kg)  06/28/23 157 lb 4.8 oz (71.4 kg)  06/17/23 153 lb (69.4 kg)    Physical Exam  Results for orders placed or performed in visit on 06/17/23  Vitamin B12   Collection Time: 06/28/23 12:00 AM  Result Value Ref Range   Vitamin B-12 1,223 232 - 1,245 pg/mL  Folate   Collection Time: 06/28/23 12:00 AM  Result Value Ref Range   Folate >20.0 >3.0 ng/mL  Iron and TIBC   Collection Time: 06/28/23 12:00 AM  Result Value Ref Range   Total Iron Binding Capacity 269 250 - 450 ug/dL   UIBC 213 086 - 578 ug/dL   Iron 99 27 - 469 ug/dL   Iron Saturation 37 15 - 55 %  Ferritin   Collection Time: 06/28/23 12:00 AM  Result Value Ref Range   Ferritin 211 (H) 15 - 150 ng/mL  CMP14+EGFR   Collection Time: 06/28/23 12:00 AM  Result Value Ref Range   Glucose 90 70 - 99 mg/dL   BUN 15 6 - 24 mg/dL   Creatinine, Ser 6.29 0.57 - 1.00 mg/dL   eGFR 528 >41 LK/GMW/1.02   BUN/Creatinine Ratio 24 (H) 9 - 23   Sodium 142 134  - 144 mmol/L   Potassium 4.0 3.5 - 5.2 mmol/L   Chloride 101 96 - 106 mmol/L   CO2 26 20 - 29 mmol/L   Calcium 9.4 8.7 - 10.2 mg/dL   Total Protein 6.7 6.0 - 8.5 g/dL   Albumin 4.3 3.9 - 4.9 g/dL   Globulin, Total 2.4 1.5 - 4.5 g/dL   Bilirubin Total 0.4 0.0 - 1.2 mg/dL   Alkaline Phosphatase 51 44 - 121 IU/L   AST 22 0 - 40 IU/L   ALT 20 0 - 32 IU/L  CBC with Differential/Platelet   Collection Time: 06/28/23 12:00 AM  Result Value Ref Range   WBC 3.1 (L) 3.4 - 10.8 x10E3/uL   RBC 4.62 3.77 - 5.28 x10E6/uL   Hemoglobin 13.7 11.1 - 15.9 g/dL   Hematocrit 72.5 36.6 - 46.6 %   MCV 90 79 - 97 fL   MCH 29.7 26.6 - 33.0 pg   MCHC 33.0 31.5 - 35.7 g/dL   RDW 44.0 34.7 - 42.5 %   Platelets 187 150 - 450 x10E3/uL   Neutrophils 46 Not  Estab. %   Lymphs 42 Not Estab. %   Monocytes 8 Not Estab. %   Eos 3 Not Estab. %   Basos 1 Not Estab. %   Neutrophils Absolute 1.4 1.4 - 7.0 x10E3/uL   Lymphocytes Absolute 1.3 0.7 - 3.1 x10E3/uL   Monocytes Absolute 0.3 0.1 - 0.9 x10E3/uL   EOS (ABSOLUTE) 0.1 0.0 - 0.4 x10E3/uL   Basophils Absolute 0.0 0.0 - 0.2 x10E3/uL   Immature Granulocytes 0 Not Estab. %   Immature Grans (Abs) 0.0 0.0 - 0.1 x10E3/uL  Lipid Panel With LDL/HDL Ratio   Collection Time: 06/28/23 12:00 AM  Result Value Ref Range   Cholesterol, Total 140 100 - 199 mg/dL   Triglycerides 72 0 - 149 mg/dL   HDL 62 >44 mg/dL   VLDL Cholesterol Cal 14 5 - 40 mg/dL   LDL Chol Calc (NIH) 64 0 - 99 mg/dL   LDL/HDL Ratio 1.0 0.0 - 3.2 ratio  Insulin, random   Collection Time: 06/28/23 12:00 AM  Result Value Ref Range   INSULIN 72.2 (H) 2.6 - 24.9 uIU/mL  Hemoglobin A1c   Collection Time: 06/28/23 12:00 AM  Result Value Ref Range   Hgb A1c MFr Bld 5.5 4.8 - 5.6 %   Est. average glucose Bld gHb Est-mCnc 111 mg/dL  VITAMIN D 25 Hydroxy (Vit-D Deficiency, Fractures)   Collection Time: 06/28/23 12:00 AM  Result Value Ref Range   Vit D, 25-Hydroxy 74.9 30.0 - 100.0 ng/mL  TSH    Collection Time: 06/28/23 12:00 AM  Result Value Ref Range   TSH 5.320 (H) 0.450 - 4.500 uIU/mL  Magnesium   Collection Time: 06/28/23 12:00 AM  Result Value Ref Range   Magnesium 1.9 1.6 - 2.3 mg/dL  Zinc   Collection Time: 06/28/23 12:00 AM  Result Value Ref Range   Zinc 69 44 - 115 ug/dL  Vitamin A   Collection Time: 06/28/23 12:00 AM  Result Value Ref Range   Vitamin A 38.1 20.1 - 62.0 ug/dL  Parathyroid hormone, intact (no Ca)   Collection Time: 06/28/23 12:00 AM  Result Value Ref Range   PTH 24 15 - 65 pg/mL  Specimen status report   Collection Time: 06/28/23 12:00 AM  Result Value Ref Range   specimen status report Comment    {Labs (Optional):23779}    Assessment & Plan:   Problem List Items Addressed This Visit   None    Assessment and Plan             Follow up plan: No follow-ups on file.

## 2023-10-04 ENCOUNTER — Ambulatory Visit: Payer: Medicaid Other | Admitting: Nurse Practitioner

## 2023-10-04 ENCOUNTER — Encounter: Payer: Self-pay | Admitting: Nurse Practitioner

## 2023-10-04 VITALS — BP 118/72 | HR 77 | Temp 98.0°F | Resp 16 | Ht 61.0 in | Wt 161.2 lb

## 2023-10-04 DIAGNOSIS — L209 Atopic dermatitis, unspecified: Secondary | ICD-10-CM

## 2023-10-04 DIAGNOSIS — L301 Dyshidrosis [pompholyx]: Secondary | ICD-10-CM

## 2023-10-04 DIAGNOSIS — Z20828 Contact with and (suspected) exposure to other viral communicable diseases: Secondary | ICD-10-CM

## 2023-10-04 MED ORDER — TRIAMCINOLONE ACETONIDE 0.5 % EX OINT: 1.0000 | TOPICAL_OINTMENT | Freq: Two times a day (BID) | CUTANEOUS | 0 refills | Status: DC

## 2023-10-04 MED ORDER — OSELTAMIVIR PHOSPHATE 75 MG PO CAPS: 75.0000 mg | ORAL_CAPSULE | Freq: Two times a day (BID) | ORAL | 0 refills | Status: AC

## 2023-10-04 NOTE — Progress Notes (Signed)
BP 118/72   Pulse 77   Temp 98 F (36.7 C)   Resp 16   Ht 5\' 1"  (1.549 m)   Wt 161 lb 3.2 oz (73.1 kg)   SpO2 97%   BMI 30.46 kg/m    Subjective:    Patient ID: [name], female    DOB: 22-Dec-1975, 48 y.o.   MRN: [id number]  HPI: [name] is a 48 y.o. female  Chief Complaint  Patient presents with   Eczema    Discussed the use of AI scribe software for clinical note transcription with the patient, who gave verbal consent to proceed.  History of Present Illness   The patient presents with a recurrent, painful, and itchy rash on the left hand. She describes the rash as starting with small blisters that eventually crack and dry out. The rash is localized to the left hand and is particularly severe under the nails, causing the nail to separate from the skin. The patient reports that the rash can become so severe that the affected finger swells and turns purple. She has tried various treatments, including hydrocortisone and eczema lotions and gels, but none have provided lasting relief. The patient is concerned about becoming dependent on steroid creams. She also reports stress-induced itching.       06/28/2023    2:10 PM 12/01/2022    1:50 PM 08/20/2022    3:07 PM  Depression screen PHQ 2/9  Decreased Interest 0 0 0  Down, Depressed, Hopeless 0 0 0  PHQ - 2 Score 0 0 0    Relevant past medical, surgical, family and social history reviewed and updated as indicated. Interim medical history since our last visit reviewed. Allergies and medications reviewed and updated.  Review of Systems  Ten systems reviewed and is negative except as mentioned in HPI      Objective:    BP 118/72   Pulse 77   Temp 98 F (36.7 C)   Resp 16   Ht 5\' 1"  (1.549 m)   Wt 161 lb 3.2 oz (73.1 kg)   SpO2 97%   BMI 30.46 kg/m    Wt Readings from Last 3 Encounters:  10/04/23 161 lb 3.2 oz (73.1 kg)  08/16/23 153 lb (69.4 kg)  06/28/23 157 lb 4.8 oz (71.4 kg)    Physical  Exam Vitals reviewed.  Constitutional:      Appearance: Normal appearance.  HENT:     Head: Normocephalic.  Cardiovascular:     Rate and Rhythm: Normal rate and regular rhythm.  Pulmonary:     Effort: Pulmonary effort is normal.     Breath sounds: Normal breath sounds.  Musculoskeletal:        General: Normal range of motion.  Skin:    General: Skin is warm and dry.     Findings: Rash present.       Neurological:     General: No focal deficit present.     Mental Status: She is alert and oriented to person, place, and time. Mental status is at baseline.  Psychiatric:        Mood and Affect: Mood normal.        Behavior: Behavior normal.        Thought Content: Thought content normal.        Judgment: Judgment normal.     Results for orders placed or performed in visit on 06/17/23  Vitamin B12   Collection Time: 06/28/23 12:00 AM  Result Value  Ref Range   Vitamin B-12 1,223 232 - 1,245 pg/mL  Folate   Collection Time: 06/28/23 12:00 AM  Result Value Ref Range   Folate >20.0 >3.0 ng/mL  Iron and TIBC   Collection Time: 06/28/23 12:00 AM  Result Value Ref Range   Total Iron Binding Capacity 269 250 - 450 ug/dL   UIBC 161 096 - 045 ug/dL   Iron 99 27 - 409 ug/dL   Iron Saturation 37 15 - 55 %  Ferritin   Collection Time: 06/28/23 12:00 AM  Result Value Ref Range   Ferritin 211 (H) 15 - 150 ng/mL  CMP14+EGFR   Collection Time: 06/28/23 12:00 AM  Result Value Ref Range   Glucose 90 70 - 99 mg/dL   BUN 15 6 - 24 mg/dL   Creatinine, Ser 8.11 0.57 - 1.00 mg/dL   eGFR 914 >78 GN/FAO/1.30   BUN/Creatinine Ratio 24 (H) 9 - 23   Sodium 142 134 - 144 mmol/L   Potassium 4.0 3.5 - 5.2 mmol/L   Chloride 101 96 - 106 mmol/L   CO2 26 20 - 29 mmol/L   Calcium 9.4 8.7 - 10.2 mg/dL   Total Protein 6.7 6.0 - 8.5 g/dL   Albumin 4.3 3.9 - 4.9 g/dL   Globulin, Total 2.4 1.5 - 4.5 g/dL   Bilirubin Total 0.4 0.0 - 1.2 mg/dL   Alkaline Phosphatase 51 44 - 121 IU/L   AST 22 0 - 40  IU/L   ALT 20 0 - 32 IU/L  CBC with Differential/Platelet   Collection Time: 06/28/23 12:00 AM  Result Value Ref Range   WBC 3.1 (L) 3.4 - 10.8 x10E3/uL   RBC 4.62 3.77 - 5.28 x10E6/uL   Hemoglobin 13.7 11.1 - 15.9 g/dL   Hematocrit 86.5 78.4 - 46.6 %   MCV 90 79 - 97 fL   MCH 29.7 26.6 - 33.0 pg   MCHC 33.0 31.5 - 35.7 g/dL   RDW 69.6 29.5 - 28.4 %   Platelets 187 150 - 450 x10E3/uL   Neutrophils 46 Not Estab. %   Lymphs 42 Not Estab. %   Monocytes 8 Not Estab. %   Eos 3 Not Estab. %   Basos 1 Not Estab. %   Neutrophils Absolute 1.4 1.4 - 7.0 x10E3/uL   Lymphocytes Absolute 1.3 0.7 - 3.1 x10E3/uL   Monocytes Absolute 0.3 0.1 - 0.9 x10E3/uL   EOS (ABSOLUTE) 0.1 0.0 - 0.4 x10E3/uL   Basophils Absolute 0.0 0.0 - 0.2 x10E3/uL   Immature Granulocytes 0 Not Estab. %   Immature Grans (Abs) 0.0 0.0 - 0.1 x10E3/uL  Lipid Panel With LDL/HDL Ratio   Collection Time: 06/28/23 12:00 AM  Result Value Ref Range   Cholesterol, Total 140 100 - 199 mg/dL   Triglycerides 72 0 - 149 mg/dL   HDL 62 >13 mg/dL   VLDL Cholesterol Cal 14 5 - 40 mg/dL   LDL Chol Calc (NIH) 64 0 - 99 mg/dL   LDL/HDL Ratio 1.0 0.0 - 3.2 ratio  Insulin, random   Collection Time: 06/28/23 12:00 AM  Result Value Ref Range   INSULIN 72.2 (H) 2.6 - 24.9 uIU/mL  Hemoglobin A1c   Collection Time: 06/28/23 12:00 AM  Result Value Ref Range   Hgb A1c MFr Bld 5.5 4.8 - 5.6 %   Est. average glucose Bld gHb Est-mCnc 111 mg/dL  VITAMIN D 25 Hydroxy (Vit-D Deficiency, Fractures)   Collection Time: 06/28/23 12:00 AM  Result Value Ref Range  Vit D, 25-Hydroxy 74.9 30.0 - 100.0 ng/mL  TSH   Collection Time: 06/28/23 12:00 AM  Result Value Ref Range   TSH 5.320 (H) 0.450 - 4.500 uIU/mL  Magnesium   Collection Time: 06/28/23 12:00 AM  Result Value Ref Range   Magnesium 1.9 1.6 - 2.3 mg/dL  Zinc   Collection Time: 06/28/23 12:00 AM  Result Value Ref Range   Zinc 69 44 - 115 ug/dL  Vitamin A   Collection Time: 06/28/23  12:00 AM  Result Value Ref Range   Vitamin A 38.1 20.1 - 62.0 ug/dL  Parathyroid hormone, intact (no Ca)   Collection Time: 06/28/23 12:00 AM  Result Value Ref Range   PTH 24 15 - 65 pg/mL  Specimen status report   Collection Time: 06/28/23 12:00 AM  Result Value Ref Range   specimen status report Comment        Assessment & Plan:   Problem List Items Addressed This Visit   None Visit Diagnoses       Dyshidrotic eczema    -  Primary   Relevant Medications   triamcinolone ointment (KENALOG) 0.5 %     Exposure to the flu       Relevant Medications   oseltamivir (TAMIFLU) 75 MG capsule     Atopic dermatitis, unspecified type            Assessment and Plan    Eczema Severe, painful, and itchy blisters on the left hand. Failed multiple over-the-counter treatments. Concerns about steroid dependence. -Trial of Zoryve cream sample twice daily. -Prescribe Kenalog cream   Influenza exposure Patient lives with multiple family members diagnosed with influenza. -Prescribe Tamiflu for prophylaxis, to be taken twice daily for five days.        Follow up plan: Return if symptoms worsen or fail to improve.

## 2023-10-05 ENCOUNTER — Other Ambulatory Visit: Payer: Self-pay | Admitting: Nurse Practitioner

## 2023-10-05 DIAGNOSIS — E038 Other specified hypothyroidism: Secondary | ICD-10-CM

## 2023-10-07 ENCOUNTER — Other Ambulatory Visit: Payer: Self-pay | Admitting: Nurse Practitioner

## 2023-10-07 DIAGNOSIS — E890 Postprocedural hypothyroidism: Secondary | ICD-10-CM

## 2023-10-07 NOTE — Telephone Encounter (Signed)
Called pt no answer left detailed vm.

## 2023-10-07 NOTE — Telephone Encounter (Signed)
Requested medication (s) are due for refill today: yes  Requested medication (s) are on the active medication list: yes  Last refill:  07/07/23 #90  Future visit scheduled: no  Notes to clinic:  lab work outside normal range   Requested Prescriptions  Pending Prescriptions Disp Refills   ARMOUR THYROID 90 MG tablet [Pharmacy Med Name: ARMOUR THYROID 90MG  TABLET] 90 tablet 0    Sig: TAKE ONE TABLET BY MOUTH ONCE A DAY     Endocrinology:  Hypothyroid Agents Failed - 10/07/2023  9:59 AM      Failed - TSH in normal range and within 360 days    TSH  Date Value Ref Range Status  06/28/2023 5.320 (H) 0.450 - 4.500 uIU/mL Final         Passed - Valid encounter within last 12 months    Recent Outpatient Visits           3 days ago Dyshidrotic eczema   [facility] Berniece Salines, FNP   3 months ago Allergic rhinitis, unspecified seasonality, unspecified trigger   [facility] Berniece Salines, FNP   10 months ago Viral upper respiratory tract infection   [place] [facility] [place] [facility], Oswaldo Conroy, PA-C   1 year ago Viral upper respiratory tract infection   [facility] Berniece Salines, FNP   1 year ago Viral upper respiratory tract infection   [facility] Margarita Mail, [place]

## 2023-10-08 ENCOUNTER — Other Ambulatory Visit: Payer: Self-pay | Admitting: Nurse Practitioner

## 2023-10-08 ENCOUNTER — Other Ambulatory Visit: Payer: Self-pay | Admitting: Family Medicine

## 2023-10-08 DIAGNOSIS — E038 Other specified hypothyroidism: Secondary | ICD-10-CM

## 2023-10-08 MED ORDER — THYROID 90 MG PO TABS: 90.0000 mg | ORAL_TABLET | Freq: Every day | ORAL | 0 refills | Status: DC

## 2023-10-08 MED ORDER — THYROID 15 MG PO TABS: 15.0000 mg | ORAL_TABLET | Freq: Every day | ORAL | 0 refills | Status: DC

## 2023-10-09 DIAGNOSIS — Z419 Encounter for procedure for purposes other than remedying health state, unspecified: Secondary | ICD-10-CM | POA: Diagnosis not present

## 2023-10-13 ENCOUNTER — Other Ambulatory Visit: Payer: Self-pay | Admitting: Nurse Practitioner

## 2023-10-13 ENCOUNTER — Encounter: Payer: Self-pay | Admitting: Nurse Practitioner

## 2023-10-13 DIAGNOSIS — L209 Atopic dermatitis, unspecified: Secondary | ICD-10-CM

## 2023-10-13 MED ORDER — ZORYVE 0.15 % EX CREA: 1.0000 | TOPICAL_CREAM | Freq: Every day | CUTANEOUS | 3 refills | Status: DC

## 2023-10-14 ENCOUNTER — Telehealth: Payer: Self-pay

## 2023-10-14 ENCOUNTER — Other Ambulatory Visit: Payer: Self-pay | Admitting: Nurse Practitioner

## 2023-10-14 DIAGNOSIS — L209 Atopic dermatitis, unspecified: Secondary | ICD-10-CM

## 2023-10-14 MED ORDER — TACROLIMUS 0.03 % EX OINT: TOPICAL_OINTMENT | Freq: Two times a day (BID) | CUTANEOUS | 0 refills | Status: DC

## 2023-10-14 NOTE — Telephone Encounter (Signed)
 Working on [employer]

## 2023-10-14 NOTE — Telephone Encounter (Signed)
 PA done through cover my meds for: Roflumilast  (ZORYVE ) 0.15 % CREA   PA DENIED, must try alternatives first:  Here is list of preferred alternatives: Adbry Syringe, Dupixent Pen / Syringe, Elidel Cream, Eucrisa 2% Ointment, Protopic  Ointment, tacrolimus  ointment (generic for Protopic ).

## 2023-10-14 NOTE — Telephone Encounter (Signed)
No answer from pt left detailed vm. °

## 2023-10-17 ENCOUNTER — Encounter (INDEPENDENT_AMBULATORY_CARE_PROVIDER_SITE_OTHER): Payer: Self-pay | Admitting: Family Medicine

## 2023-10-19 NOTE — Telephone Encounter (Unsigned)
Copied from CRM [id number]. Topic: General - Other >> Oct 19, 2023 11:25 AM Everette C wrote: Reason for CRM: Thea with the patients pharmacy has called to request discussion with a member of clinical staff for potential alternatives to the patient's prescription of Roflumilast (ZORYVE) 0.15 % CREA [[id number]]  Regis Bill can be reached at [phone] to discuss further   Tacrolimus is the preferred alternative

## 2023-10-20 ENCOUNTER — Other Ambulatory Visit: Payer: Self-pay | Admitting: Nurse Practitioner

## 2023-10-20 ENCOUNTER — Ambulatory Visit (INDEPENDENT_AMBULATORY_CARE_PROVIDER_SITE_OTHER): Payer: Medicaid Other | Admitting: Family Medicine

## 2023-11-02 DIAGNOSIS — E890 Postprocedural hypothyroidism: Secondary | ICD-10-CM | POA: Diagnosis not present

## 2023-11-03 ENCOUNTER — Other Ambulatory Visit: Payer: Self-pay | Admitting: Nurse Practitioner

## 2023-11-03 ENCOUNTER — Encounter: Payer: Self-pay | Admitting: Nurse Practitioner

## 2023-11-03 DIAGNOSIS — E890 Postprocedural hypothyroidism: Secondary | ICD-10-CM

## 2023-11-03 LAB — TSH: TSH: 15.49 m[IU]/L — ABNORMAL HIGH

## 2023-11-03 MED ORDER — THYROID 120 MG PO TABS
120.0000 mg | ORAL_TABLET | Freq: Every day | ORAL | 0 refills | Status: DC
Start: 2023-11-03 — End: 2024-01-08

## 2023-11-05 DIAGNOSIS — J3489 Other specified disorders of nose and nasal sinuses: Secondary | ICD-10-CM | POA: Diagnosis not present

## 2023-11-05 DIAGNOSIS — J342 Deviated nasal septum: Secondary | ICD-10-CM | POA: Diagnosis not present

## 2023-11-05 DIAGNOSIS — J34829 Nasal valve collapse, unspecified: Secondary | ICD-10-CM | POA: Diagnosis not present

## 2023-11-06 DIAGNOSIS — Z419 Encounter for procedure for purposes other than remedying health state, unspecified: Secondary | ICD-10-CM | POA: Diagnosis not present

## 2023-11-08 DIAGNOSIS — Z13820 Encounter for screening for osteoporosis: Secondary | ICD-10-CM | POA: Diagnosis not present

## 2023-11-17 ENCOUNTER — Ambulatory Visit (INDEPENDENT_AMBULATORY_CARE_PROVIDER_SITE_OTHER): Payer: Medicaid Other | Admitting: Family Medicine

## 2023-11-17 ENCOUNTER — Encounter (INDEPENDENT_AMBULATORY_CARE_PROVIDER_SITE_OTHER): Payer: Self-pay | Admitting: Family Medicine

## 2023-11-19 ENCOUNTER — Telehealth: Admitting: Nurse Practitioner

## 2023-11-19 DIAGNOSIS — R6889 Other general symptoms and signs: Secondary | ICD-10-CM

## 2023-11-20 MED ORDER — OSELTAMIVIR PHOSPHATE 75 MG PO CAPS: 75.0000 mg | ORAL_CAPSULE | Freq: Two times a day (BID) | ORAL | 0 refills | Status: AC

## 2023-11-20 NOTE — Progress Notes (Signed)

## 2023-11-20 NOTE — Progress Notes (Signed)
 I have spent 5 minutes in review of e-visit questionnaire, review and updating patient chart, medical decision making and response to patient.   Claiborne Rigg, NP

## 2023-11-23 ENCOUNTER — Encounter: Payer: Self-pay | Admitting: Nurse Practitioner

## 2023-11-23 ENCOUNTER — Other Ambulatory Visit: Payer: Self-pay | Admitting: Emergency Medicine

## 2023-11-23 DIAGNOSIS — J4521 Mild intermittent asthma with (acute) exacerbation: Secondary | ICD-10-CM

## 2023-11-24 MED ORDER — ALBUTEROL SULFATE HFA 108 (90 BASE) MCG/ACT IN AERS: 2.0000 | INHALATION_SPRAY | Freq: Four times a day (QID) | RESPIRATORY_TRACT | 3 refills | Status: DC | PRN

## 2023-12-01 NOTE — Progress Notes (Signed)
 "  BP 116/66   Pulse 97   Resp 18   Ht 1' (0.305 m)   Wt 163 lb 8 oz (74.2 kg)   SpO2 97%   BMI 798.29 kg/m    Subjective:    Patient ID: [name], female    DOB: 02/11/1976, 48 y.o.   MRN: [id number]  HPI: [name] is a 48 y.o. female  Chief Complaint  Patient presents with   RLS    Discussed the use of AI scribe software for clinical note transcription with the patient, who gave verbal consent to proceed.  History of Present Illness [name] is a 48 year old female with hypothyroidism who presents with symptoms of restless leg syndrome.  She has been experiencing symptoms of restless leg syndrome for approximately one year, with a noticeable worsening over time. The sensation is described as similar to 'that pit of anxiety you get in your stomach,' but located in her legs. This occurs primarily when trying to sleep, just before entering deep sleep, causing an urge to move or tighten her muscles, although her legs do not move involuntarily. Her father also experiences similar symptoms, suggesting a possible familial component.  She has a history of hypothyroidism, with her last thyroid  level recorded at 15.49. Her medication was adjusted to 120 mg of Armour Thyroid  daily, which she takes consistently.  Three weeks ago, she had the flu, which transitioned into persistent nasal congestion. She attributes her ongoing symptoms to allergies, frequently blowing her nose but not feeling otherwise unwell. She is currently taking Allegra , Singulair , and Flonase  for her allergy symptoms.         12/02/2023    3:23 PM 06/28/2023    2:10 PM 12/01/2022    1:50 PM  Depression screen PHQ 2/9  Decreased Interest 0 0 0  Down, Depressed, Hopeless 0 0 0  PHQ - 2 Score 0 0 0  Altered sleeping 0    Tired, decreased energy 0    Change in appetite 0    Feeling bad or failure about yourself  0    Trouble concentrating 0    Moving slowly or fidgety/restless 0     Suicidal thoughts 0    PHQ-9 Score 0    Difficult doing work/chores Not difficult at all      Relevant past medical, surgical, family and social history reviewed and updated as indicated. Interim medical history since our last visit reviewed. Allergies and medications reviewed and updated.  Review of Systems  Ten systems reviewed and is negative except as mentioned in HPI      Objective:    BP 116/66   Pulse 97   Resp 18   Ht 1' (0.305 m)   Wt 163 lb 8 oz (74.2 kg)   SpO2 97%   BMI 798.29 kg/m    Wt Readings from Last 3 Encounters:  12/02/23 163 lb 8 oz (74.2 kg)  10/04/23 161 lb 3.2 oz (73.1 kg)  08/16/23 153 lb (69.4 kg)    Physical Exam Physical Exam GENERAL: Alert, cooperative, well developed, no acute distress HEENT: Normocephalic, normal oropharynx, moist mucous membranes CHEST: Clear to auscultation bilaterally, no wheezes, rhonchi, or crackles CARDIOVASCULAR: Normal heart rate and rhythm, S1 and S2 normal without murmurs ABDOMEN: Soft, non-tender, non-distended, without organomegaly, normal bowel sounds EXTREMITIES: No cyanosis or edema NEUROLOGICAL: Cranial nerves grossly intact, moves all extremities without gross motor or sensory deficit   Results for orders placed or  performed in visit on 10/07/23  TSH   Collection Time: 11/02/23  2:24 PM  Result Value Ref Range   TSH 15.49 (H) mIU/L       Assessment & Plan:   Problem List Items Addressed This Visit   None Visit Diagnoses       Restless leg syndrome    -  Primary   Relevant Medications   gabapentin  (NEURONTIN ) 100 MG capsule   Other Relevant Orders   TSH   CBC with Differential/Platelet   Iron, TIBC and Ferritin Panel   Vitamin B12        Assessment and Plan Assessment & Plan Restless Leg Syndrome   Reports worsening symptoms, particularly at bedtime, with sensations akin to anxiety in her legs and an urge to move them. Symptoms have persisted for about a year and have recently  intensified. Suspected restless leg syndrome with potential underlying causes such as thyroid  dysfunction or iron deficiency. Gabapentin  is selected for its sedative effect to manage symptoms at bedtime. Informed that gabapentin  is not an NSAID and is safe for her use. Starting at the lowest dose to evaluate effectiveness and adjust as needed.   - Order TSH, CBC, iron panel, and vitamin B12 tests to investigate underlying causes   - Initiate gabapentin  at bedtime   - Instruct to report on gabapentin 's effectiveness    Hypothyroidism   Currently on 120 mg of Armour Thyroid  daily. Last thyroid  level was 15.49, indicating potential suboptimal control. Thyroid  function may contribute to restless leg syndrome symptoms.   - Order TSH test to evaluate current thyroid  function    Allergic Rhinitis   Experiences persistent nasal symptoms, including frequent rhinorrhea, attributed to allergies. Currently taking Allegra , Singulair , and Flonase , which are appropriate treatments.        Follow up plan: Return if symptoms worsen or fail to improve.      "

## 2023-12-02 ENCOUNTER — Ambulatory Visit (INDEPENDENT_AMBULATORY_CARE_PROVIDER_SITE_OTHER): Admitting: Nurse Practitioner

## 2023-12-02 ENCOUNTER — Encounter: Payer: Self-pay | Admitting: Nurse Practitioner

## 2023-12-02 VITALS — BP 116/66 | HR 97 | Resp 18 | Ht <= 58 in | Wt 163.5 lb

## 2023-12-02 DIAGNOSIS — G2581 Restless legs syndrome: Secondary | ICD-10-CM

## 2023-12-02 MED ORDER — GABAPENTIN 100 MG PO CAPS: 100.0000 mg | ORAL_CAPSULE | Freq: Every day | ORAL | 1 refills | Status: DC

## 2023-12-03 ENCOUNTER — Other Ambulatory Visit: Payer: Self-pay | Admitting: Nurse Practitioner

## 2023-12-03 ENCOUNTER — Encounter: Payer: Self-pay | Admitting: Nurse Practitioner

## 2023-12-03 DIAGNOSIS — E890 Postprocedural hypothyroidism: Secondary | ICD-10-CM

## 2023-12-03 LAB — TSH: TSH: 11.24 m[IU]/L — ABNORMAL HIGH

## 2023-12-03 LAB — CBC WITH DIFFERENTIAL/PLATELET
Absolute Lymphocytes: 1861 {cells}/uL (ref 850–3900)
Absolute Monocytes: 580 {cells}/uL (ref 200–950)
Basophils Absolute: 49 {cells}/uL (ref 0–200)
Basophils Relative: 0.8 %
Eosinophils Absolute: 67 {cells}/uL (ref 15–500)
Eosinophils Relative: 1.1 %
HCT: 39.5 % (ref 35.0–45.0)
Hemoglobin: 13.4 g/dL (ref 11.7–15.5)
MCH: 30.2 pg (ref 27.0–33.0)
MCHC: 33.9 g/dL (ref 32.0–36.0)
MCV: 89.2 fL (ref 80.0–100.0)
MPV: 10.5 fL (ref 7.5–12.5)
Monocytes Relative: 9.5 %
Neutro Abs: 3544 {cells}/uL (ref 1500–7800)
Neutrophils Relative %: 58.1 %
Platelets: 219 10*3/uL (ref 140–400)
RBC: 4.43 10*6/uL (ref 3.80–5.10)
RDW: 11.8 % (ref 11.0–15.0)
Total Lymphocyte: 30.5 %
WBC: 6.1 10*3/uL (ref 3.8–10.8)

## 2023-12-03 LAB — IRON,TIBC AND FERRITIN PANEL
%SAT: 31 % (ref 16–45)
Ferritin: 95 ng/mL (ref 16–232)
Iron: 92 ug/dL (ref 40–190)
TIBC: 296 ug/dL (ref 250–450)

## 2023-12-03 LAB — VITAMIN B12: Vitamin B-12: 1018 pg/mL (ref 200–1100)

## 2023-12-03 MED ORDER — THYROID 15 MG PO TABS: ORAL_TABLET | ORAL | 0 refills | Status: DC

## 2023-12-18 DIAGNOSIS — Z419 Encounter for procedure for purposes other than remedying health state, unspecified: Secondary | ICD-10-CM | POA: Diagnosis not present

## 2023-12-23 ENCOUNTER — Telehealth (INDEPENDENT_AMBULATORY_CARE_PROVIDER_SITE_OTHER): Payer: Self-pay | Admitting: *Deleted

## 2023-12-23 ENCOUNTER — Telehealth (INDEPENDENT_AMBULATORY_CARE_PROVIDER_SITE_OTHER): Admitting: Family Medicine

## 2023-12-23 DIAGNOSIS — E88819 Insulin resistance, unspecified: Secondary | ICD-10-CM | POA: Diagnosis not present

## 2023-12-23 DIAGNOSIS — E785 Hyperlipidemia, unspecified: Secondary | ICD-10-CM | POA: Diagnosis not present

## 2023-12-23 DIAGNOSIS — R7989 Other specified abnormal findings of blood chemistry: Secondary | ICD-10-CM | POA: Diagnosis not present

## 2023-12-23 DIAGNOSIS — E559 Vitamin D deficiency, unspecified: Secondary | ICD-10-CM

## 2023-12-23 DIAGNOSIS — Z6830 Body mass index (BMI) 30.0-30.9, adult: Secondary | ICD-10-CM

## 2023-12-23 DIAGNOSIS — Z9884 Bariatric surgery status: Secondary | ICD-10-CM | POA: Diagnosis not present

## 2023-12-23 DIAGNOSIS — E669 Obesity, unspecified: Secondary | ICD-10-CM | POA: Diagnosis not present

## 2023-12-23 NOTE — Progress Notes (Addendum)
 Office: [phone]  /  Fax: [phone]  WEIGHT SUMMARY AND BIOMETRICS  No data recorded No data recorded No data recorded  Virtual Visit via Telehealth Note  I connected with [name] on 12/23/23 at 10:40 AM EDT by audiovisual telehealth and verified that I am speaking with the correct person using two identifiers.  Location: Patient: home Provider: home   I discussed the limitations, risks, security and privacy concerns of performing an evaluation and management service by AV telehealth and the availability of in person appointments. I also discussed with the patient that there may be a patient responsible charge related to this service. The patient expressed understanding and agreed to proceed.    Chief Complaint: OBESITY   History of Present Illness   [name] is a 48 year old female with a history of bariatric surgery who presents with concerns about recent weight gain.  She has gained approximately three pounds since her last office visit four months ago. She attributes this weight gain to nighttime grazing due to boredom, with a particular craving for carbohydrates. She attempted a five-day reset diet, which did not significantly help, but it did help her refocus on her goals.  She is concerned about reaching a weight of 160 pounds on her home scale, which she considers a threshold for taking action. She acknowledges becoming less strict with her eating habits and is aware that weight regain is common around the third year post-surgery. She is trying to be more mindful of her eating habits to prevent further weight gain.  She focuses on protein and sugar intake, ensuring low sugar and high protein in her diet. However, she is concerned about not managing her calorie intake effectively, which may be counteracting her progress.  Regarding physical activity, she has been consistent with exercise in the past but has recently decreased her activity, particularly with  muscle-building exercises and walking. She attributes this to the weather and pollen, which affects her ability to exercise outdoors. She has been doing stroller walks with her grandchild when possible.  No issues with hunger levels but acknowledges nighttime grazing due to boredom.          PHYSICAL EXAM:  There were no vitals taken for this visit. There is no height or weight on file to calculate BMI.  DIAGNOSTIC DATA REVIEWED:  BMET    Component Value Date/Time   NA 142 06/28/2023 0000   K 4.0 06/28/2023 0000   CL 101 06/28/2023 0000   CO2 26 06/28/2023 0000   GLUCOSE 90 06/28/2023 0000   GLUCOSE 87 04/30/2022 1603   BUN 15 06/28/2023 0000   CREATININE 0.62 06/28/2023 0000   CREATININE 0.52 04/30/2022 1603   CALCIUM 9.4 06/28/2023 0000   GFRNONAA 105 07/22/2020 1059   GFRNONAA 116 10/01/2017 1041   GFRAA 121 07/22/2020 1059   GFRAA 135 10/01/2017 1041   Lab Results  Component Value Date   HGBA1C 5.5 06/28/2023   HGBA1C 6.0 (H) 10/01/2017   Lab Results  Component Value Date   INSULIN  72.2 (H) 06/28/2023   INSULIN  20.1 12/07/2017   Lab Results  Component Value Date   TSH 11.24 (H) 12/02/2023   CBC    Component Value Date/Time   WBC 6.1 12/02/2023 1534   RBC 4.43 12/02/2023 1534   HGB 13.4 12/02/2023 1534   HGB 13.7 06/28/2023 0000   HCT 39.5 12/02/2023 1534   HCT 41.5 06/28/2023 0000   PLT 219 12/02/2023 1534   PLT 187  06/28/2023 0000   MCV 89.2 12/02/2023 1534   MCV 90 06/28/2023 0000   MCH 30.2 12/02/2023 1534   MCHC 33.9 12/02/2023 1534   RDW 11.8 12/02/2023 1534   RDW 11.7 06/28/2023 0000   Iron Studies    Component Value Date/Time   IRON 92 12/02/2023 1534   IRON 99 06/28/2023 0000   TIBC 296 12/02/2023 1534   TIBC 269 06/28/2023 0000   FERRITIN 95 12/02/2023 1534   FERRITIN 211 (H) 06/28/2023 0000   IRONPCTSAT 31 12/02/2023 1534   Lipid Panel     Component Value Date/Time   CHOL 140 06/28/2023 0000   TRIG 72 06/28/2023 0000   HDL  62 06/28/2023 0000   CHOLHDL 2.7 04/30/2022 1603   LDLCALC 64 06/28/2023 0000   LDLCALC 58 04/30/2022 1603   Hepatic Function Panel     Component Value Date/Time   PROT 6.7 06/28/2023 0000   ALBUMIN 4.3 06/28/2023 0000   AST 22 06/28/2023 0000   ALT 20 06/28/2023 0000   ALKPHOS 51 06/28/2023 0000   BILITOT 0.4 06/28/2023 0000      Component Value Date/Time   TSH 11.24 (H) 12/02/2023 1534   Nutritional Lab Results  Component Value Date   VD25OH 74.9 06/28/2023   VD25OH 71.1 08/05/2021   VD25OH 50.4 07/22/2020     Assessment and Plan    Obesity Recent weight gain of three pounds over five months post-bariatric surgery. Reports grazing, particularly at night, and craving carbohydrates. Concerned about regaining weight and recognizes the need for a structured eating plan. Prefers obtaining protein from real food sources to enhance calorie burning, as 25% of calories from chicken breast are used in digestion, unlike supplements. - Send category one eating plan through MyChart - Advise to consume all meals before bedtime - Encourage portion control and smart food choices - Discuss importance of obtaining protein from real food sources to enhance calorie burning  Insulin  Resistance Managing insulin  resistance through diet and exercise. Emphasis on structured eating plan and exercise to improve condition. Recognizes the need to balance protein intake with overall calorie management. - Follow category one eating plan - Increase daily exercise, even in small amounts  Dyslipidemia Addressing dyslipidemia through diet and exercise. Emphasis on structured eating plan and exercise to improve lipid profile. - Follow category one eating plan - Increase daily exercise, even in small amounts  Elevated TSH Currently on thyroid  medication with recent labs showing elevated TSH levels, contributing to fatigue and decreased exercise tolerance. Requests thyroid  function re-evaluation due to  recent symptoms. - Order TSH test - Ensure fasting for eight hours before lab tests - Review thyroid  function with primary care provider  General Health Maintenance Encouraged to maintain a healthy lifestyle through diet and exercise. Indoor exercises recommended due to pollen allergies. - Encourage regular exercise, including indoor activities like walking videos, chair yoga, and wall Pilates - Advise on the benefits of structured eating plans  Follow-up Prefers follow-up in four to six weeks to maintain accountability. Labs to be done prior to follow-up visit. - Schedule follow-up visit in four to six weeks - Order lab tests and advise to complete them at convenience - Ensure fasting for eight hours before lab tests       She was informed of the importance of frequent follow up visits to maximize her success with intensive lifestyle modifications for her multiple health conditions.    Jasmine Mesi, MD

## 2023-12-23 NOTE — Telephone Encounter (Signed)
 Patient had video visit and needed the cat 1 meal plan sent through my chart.

## 2023-12-27 ENCOUNTER — Ambulatory Visit (INDEPENDENT_AMBULATORY_CARE_PROVIDER_SITE_OTHER): Admitting: Family Medicine

## 2024-01-05 ENCOUNTER — Telehealth: Payer: Self-pay | Admitting: Nurse Practitioner

## 2024-01-05 ENCOUNTER — Other Ambulatory Visit: Payer: Self-pay | Admitting: Nurse Practitioner

## 2024-01-05 DIAGNOSIS — E890 Postprocedural hypothyroidism: Secondary | ICD-10-CM

## 2024-01-05 NOTE — Telephone Encounter (Signed)
 Copied from CRM [id number]. Topic: Clinical - Medication Refill >> Jan 05, 2024  1:18 PM Baldemar Lev wrote: Most Recent Primary Care Visit:  Provider: PENDER, JULIE F  Department: [facility]  Visit Type: OFFICE VISIT  Date: 12/02/2023  Medication: thyroid  (ARMOUR THYROID ) 120 MG tablet  Has the patient contacted their pharmacy? Yes (Agent: If no, request that the patient contact the pharmacy for the refill. If patient does not wish to contact the pharmacy document the reason why and proceed with request.) (Agent: If yes, when and what did the pharmacy advise?)  Is this the correct pharmacy for this prescription? Yes If no, delete pharmacy and type the correct one.  This is the patient's preferred pharmacy:   [facility] [place] [place] - [address] [place] [address] Phone: [phone] Fax: [phone]   Has the prescription been filled recently? Yes  Is the patient out of the medication? Yes  Has the patient been seen for an appointment in the last year OR does the patient have an upcoming appointment? Yes  Can we respond through MyChart? Yes  Agent: Please be advised that Rx refills may take up to 3 business days. We ask that you follow-up with your pharmacy.

## 2024-01-08 NOTE — Telephone Encounter (Signed)
 Requested Prescriptions  Pending Prescriptions Disp Refills   thyroid  (ARMOUR THYROID ) 120 MG tablet [Pharmacy Med Name: ARMOUR THYROID  120MG  TABLET] 90 tablet 0    Sig: TAKE ONE TABLET (120 MG TOTAL) BY MOUTH DAILY BEFORE BREAKFAST.     Endocrinology:  Hypothyroid Agents Failed - 01/08/2024 10:53 AM      Failed - TSH in normal range and within 360 days    TSH  Date Value Ref Range Status  12/02/2023 11.24 (H) mIU/L Final    Comment:              Reference Range .           > or = 20 Years  0.40-4.50 .                Pregnancy Ranges           First trimester    0.26-2.66           Second trimester   0.55-2.73           Third trimester    0.43-2.91          Failed - Valid encounter within last 12 months    Recent Outpatient Visits           1 month ago Restless leg syndrome   [facility] [place], [place]

## 2024-01-17 DIAGNOSIS — Z419 Encounter for procedure for purposes other than remedying health state, unspecified: Secondary | ICD-10-CM | POA: Diagnosis not present

## 2024-01-24 DIAGNOSIS — Z9884 Bariatric surgery status: Secondary | ICD-10-CM | POA: Diagnosis not present

## 2024-01-24 DIAGNOSIS — E559 Vitamin D deficiency, unspecified: Secondary | ICD-10-CM | POA: Diagnosis not present

## 2024-01-24 DIAGNOSIS — R7989 Other specified abnormal findings of blood chemistry: Secondary | ICD-10-CM | POA: Diagnosis not present

## 2024-01-24 DIAGNOSIS — E785 Hyperlipidemia, unspecified: Secondary | ICD-10-CM | POA: Diagnosis not present

## 2024-01-24 DIAGNOSIS — E88819 Insulin resistance, unspecified: Secondary | ICD-10-CM | POA: Diagnosis not present

## 2024-01-25 ENCOUNTER — Encounter: Payer: Self-pay | Admitting: Nurse Practitioner

## 2024-01-25 ENCOUNTER — Ambulatory Visit (INDEPENDENT_AMBULATORY_CARE_PROVIDER_SITE_OTHER): Payer: Self-pay | Admitting: Family Medicine

## 2024-01-25 ENCOUNTER — Other Ambulatory Visit: Payer: Self-pay | Admitting: Nurse Practitioner

## 2024-01-25 DIAGNOSIS — E890 Postprocedural hypothyroidism: Secondary | ICD-10-CM

## 2024-01-25 LAB — CMP14+EGFR
ALT: 20 IU/L (ref 0–32)
AST: 26 IU/L (ref 0–40)
Albumin: 4.5 g/dL (ref 3.9–4.9)
Alkaline Phosphatase: 48 IU/L (ref 44–121)
BUN/Creatinine Ratio: 20 (ref 9–23)
BUN: 12 mg/dL (ref 6–24)
Bilirubin Total: 0.4 mg/dL (ref 0.0–1.2)
CO2: 25 mmol/L (ref 20–29)
Calcium: 8.8 mg/dL (ref 8.7–10.2)
Chloride: 102 mmol/L (ref 96–106)
Creatinine, Ser: 0.61 mg/dL (ref 0.57–1.00)
Globulin, Total: 2.2 g/dL (ref 1.5–4.5)
Glucose: 79 mg/dL (ref 70–99)
Potassium: 3.8 mmol/L (ref 3.5–5.2)
Sodium: 141 mmol/L (ref 134–144)
Total Protein: 6.7 g/dL (ref 6.0–8.5)
eGFR: 110 mL/min/{1.73_m2} (ref >59)

## 2024-01-25 LAB — TSH: TSH: 15.3 u[IU]/mL — ABNORMAL HIGH (ref 0.450–4.500)

## 2024-01-25 LAB — HEMOGLOBIN A1C
Est. average glucose Bld gHb Est-mCnc: 108 mg/dL
Hgb A1c MFr Bld: 5.4 % (ref 4.8–5.6)

## 2024-01-25 LAB — LIPID PANEL WITH LDL/HDL RATIO
Cholesterol, Total: 146 mg/dL (ref 100–199)
HDL: 63 mg/dL (ref >39)
LDL Chol Calc (NIH): 69 mg/dL (ref 0–99)
LDL/HDL Ratio: 1.1 ratio (ref 0.0–3.2)
Triglycerides: 70 mg/dL (ref 0–149)
VLDL Cholesterol Cal: 14 mg/dL (ref 5–40)

## 2024-01-25 LAB — INSULIN, RANDOM: INSULIN: 3.1 u[IU]/mL (ref 2.6–24.9)

## 2024-01-25 LAB — VITAMIN D 25 HYDROXY (VIT D DEFICIENCY, FRACTURES): Vit D, 25-Hydroxy: 62.8 ng/mL (ref 30.0–100.0)

## 2024-01-25 LAB — IRON AND TIBC
Iron Saturation: 36 % (ref 15–55)
Iron: 110 ug/dL (ref 27–159)
Total Iron Binding Capacity: 305 ug/dL (ref 250–450)
UIBC: 195 ug/dL (ref 131–425)

## 2024-01-25 LAB — VITAMIN B12: Vitamin B-12: 1336 pg/mL — ABNORMAL HIGH (ref 232–1245)

## 2024-01-25 LAB — FERRITIN: Ferritin: 113 ng/mL (ref 15–150)

## 2024-01-25 LAB — FOLATE: Folate: >20 ng/mL (ref >3.0)

## 2024-01-25 MED ORDER — THYROID 15 MG PO TABS: 15.0000 mg | ORAL_TABLET | Freq: Every day | ORAL | 0 refills | Status: DC

## 2024-01-31 ENCOUNTER — Encounter: Payer: Self-pay | Admitting: Nurse Practitioner

## 2024-02-02 ENCOUNTER — Telehealth: Payer: Self-pay

## 2024-02-02 ENCOUNTER — Other Ambulatory Visit: Payer: Self-pay | Admitting: Nurse Practitioner

## 2024-02-02 DIAGNOSIS — E890 Postprocedural hypothyroidism: Secondary | ICD-10-CM

## 2024-02-02 MED ORDER — THYROID 15 MG PO TABS: 15.0000 mg | ORAL_TABLET | Freq: Every day | ORAL | 0 refills | Status: DC

## 2024-02-02 NOTE — Telephone Encounter (Signed)
 Refill updated.

## 2024-02-02 NOTE — Telephone Encounter (Signed)
 Copied from CRM [id number]. Topic: Clinical - Prescription Issue >> Feb 02, 2024  2:53 PM Tiffany S wrote: Reason for CRM: thyroid  (ARMOUR THYROID ) 15 MG tablet [[id number]] Not clear on directions please follow up with pharmacy  [phone]

## 2024-02-10 ENCOUNTER — Other Ambulatory Visit: Payer: Self-pay | Admitting: Nurse Practitioner

## 2024-02-10 DIAGNOSIS — G2581 Restless legs syndrome: Secondary | ICD-10-CM

## 2024-02-10 DIAGNOSIS — F321 Major depressive disorder, single episode, moderate: Secondary | ICD-10-CM

## 2024-02-10 DIAGNOSIS — J309 Allergic rhinitis, unspecified: Secondary | ICD-10-CM

## 2024-02-10 MED ORDER — GABAPENTIN 100 MG PO CAPS: 100.0000 mg | ORAL_CAPSULE | Freq: Two times a day (BID) | ORAL | 1 refills | Status: DC

## 2024-02-11 NOTE — Telephone Encounter (Signed)
 Requested Prescriptions  Pending Prescriptions Disp Refills   DULoxetine  (CYMBALTA ) 60 MG capsule [Pharmacy Med Name: DULOXETINE  HYDROCHLORIDE 60MG  CAPSULE DR PART] 90 capsule 0    Sig: TAKE ONE CAPSULE BY MOUTH DAILY     Psychiatry: Antidepressants - SNRI - duloxetine  Failed - 02/11/2024 11:03 AM      Failed - Valid encounter within last 6 months    Recent Outpatient Visits           2 months ago Restless leg syndrome   [facility] Quinton Buckler, FNP              Passed - Cr in normal range and within 360 days    Creat  Date Value Ref Range Status  04/30/2022 0.52 0.50 - 0.99 mg/dL Final   Creatinine, Ser  Date Value Ref Range Status  01/24/2024 0.61 0.57 - 1.00 mg/dL Final         Passed - eGFR is 30 or above and within 360 days    GFR, Est African American  Date Value Ref Range Status  10/01/2017 135 > OR = 60 mL/min/1.25m2 Final   GFR calc Af Amer  Date Value Ref Range Status  07/22/2020 121 >59 mL/min/1.73 Final    Comment:    **In accordance with recommendations from the NKF-ASN Task force,**   [facility] is in the process of updating its eGFR calculation to the   2021 CKD-EPI creatinine equation that estimates kidney function   without a race variable.    GFR, Est Non African American  Date Value Ref Range Status  10/01/2017 116 > OR = 60 mL/min/1.60m2 Final   GFR calc non Af Amer  Date Value Ref Range Status  07/22/2020 105 >59 mL/min/1.73 Final   eGFR  Date Value Ref Range Status  01/24/2024 110 >59 mL/min/1.73 Final         Passed - Completed PHQ-2 or PHQ-9 in the last 360 days      Passed - Last BP in normal range    BP Readings from Last 1 Encounters:  12/02/23 116/66          montelukast  (SINGULAIR ) 5 MG chewable tablet [Pharmacy Med Name: MONTELUKAST  SODIUM 5MG  TABLET CHEWABLE] 180 tablet 0    Sig: CHEW TWO TABLETS BY MOUTH AT BEDTIME     Pulmonology:  Leukotriene Inhibitors Failed - 02/11/2024 11:03 AM       Failed - Valid encounter within last 12 months    Recent Outpatient Visits           2 months ago Restless leg syndrome   [facility] [place] Quinton Buckler, [place]

## 2024-02-17 DIAGNOSIS — Z419 Encounter for procedure for purposes other than remedying health state, unspecified: Secondary | ICD-10-CM | POA: Diagnosis not present

## 2024-03-18 DIAGNOSIS — Z419 Encounter for procedure for purposes other than remedying health state, unspecified: Secondary | ICD-10-CM | POA: Diagnosis not present

## 2024-03-22 ENCOUNTER — Ambulatory Visit (INDEPENDENT_AMBULATORY_CARE_PROVIDER_SITE_OTHER): Admitting: Family Medicine

## 2024-03-29 ENCOUNTER — Telehealth (INDEPENDENT_AMBULATORY_CARE_PROVIDER_SITE_OTHER): Admitting: Family Medicine

## 2024-03-29 ENCOUNTER — Encounter (INDEPENDENT_AMBULATORY_CARE_PROVIDER_SITE_OTHER): Payer: Self-pay | Admitting: Family Medicine

## 2024-03-29 ENCOUNTER — Encounter: Payer: Self-pay | Admitting: Nurse Practitioner

## 2024-03-29 VITALS — Ht 61.0 in

## 2024-03-29 DIAGNOSIS — B084 Enteroviral vesicular stomatitis with exanthem: Secondary | ICD-10-CM | POA: Diagnosis not present

## 2024-03-29 DIAGNOSIS — Z6830 Body mass index (BMI) 30.0-30.9, adult: Secondary | ICD-10-CM | POA: Diagnosis not present

## 2024-03-29 DIAGNOSIS — Z6841 Body Mass Index (BMI) 40.0 and over, adult: Secondary | ICD-10-CM

## 2024-03-29 DIAGNOSIS — E039 Hypothyroidism, unspecified: Secondary | ICD-10-CM | POA: Diagnosis not present

## 2024-03-29 DIAGNOSIS — E890 Postprocedural hypothyroidism: Secondary | ICD-10-CM

## 2024-03-29 DIAGNOSIS — E669 Obesity, unspecified: Secondary | ICD-10-CM | POA: Diagnosis not present

## 2024-03-29 DIAGNOSIS — Z9884 Bariatric surgery status: Secondary | ICD-10-CM | POA: Diagnosis not present

## 2024-03-29 NOTE — Progress Notes (Signed)
 Office: [phone]  /  Fax: [phone]  WEIGHT SUMMARY AND BIOMETRICS  Anthropometric Measurements Height: 5' 1 (1.549 m) Weight at Last Visit: 153 lb Starting Weight: 292 lb   No data recorded Other Clinical Data Fasting: no Labs: no Today's Visit #: 65 Starting Date: 12/07/17 Comments: Video Visit    Chief Complaint: OBESITY  Virtual Visit via A/V Note  I connected with [name] on 03/29/24 at  2:00 PM EDT by audiovisual telehealth and verified that I am speaking with the correct person using two identifiers.  Location: Patient: home Provider: clinic   I discussed the limitations, risks, security and privacy concerns of performing an evaluation and management service by AV telehealth and the availability of in person appointments. I also discussed with the patient that there may be a patient responsible charge related to this service. The patient expressed understanding and agreed to proceed.     History of Present Illness   [name] is a 47 year old female who presents with concerns about her obesity treatment plan and symptoms of hand, foot, and mouth disease.  She was exposed to hand, foot, and mouth disease by her granddaughter during a vacation last week and is experiencing symptoms consistent with the illness. Symptoms include a sore mouth, throat, and tongue with a raw sensation but no blisters. She feels weak and sluggish but not severely ill. She manages symptoms with ibuprofen, Tylenol, and chloraseptic sprays, and is trying to maintain hydration.  She is concerned about her weight management, noting weight gain since her last visit. She has not been exercising and has been consuming unhealthy foods, including chips and energy drinks. She mentions a previous weight loss surgery and fears regaining weight. Her current weight fluctuates between 160 and 165 pounds, about five pounds higher than the office scale. A seven-day dietary reset  did not significantly change her weight.  She discusses her dietary habits, often eating her granddaughter's leftovers and finding it challenging to plan meals. She acknowledges stress and comfort eating as contributing factors. She aims for a minimum of 90 grams of protein per day, ideally 120 grams, and mentions her husband's support, though he is still learning about healthy food choices.  She mentions her thyroid  function, stating it was previously low and her medication dose was increased. She is due for a recheck of her thyroid  levels.          PHYSICAL EXAM:  Height 5' 1 (1.549 m). Body mass index is 30.89 kg/m.  DIAGNOSTIC DATA REVIEWED:  BMET    Component Value Date/Time   NA 141 01/24/2024 0948   K 3.8 01/24/2024 0948   CL 102 01/24/2024 0948   CO2 25 01/24/2024 0948   GLUCOSE 79 01/24/2024 0948   GLUCOSE 87 04/30/2022 1603   BUN 12 01/24/2024 0948   CREATININE 0.61 01/24/2024 0948   CREATININE 0.52 04/30/2022 1603   CALCIUM 8.8 01/24/2024 0948   GFRNONAA 105 07/22/2020 1059   GFRNONAA 116 10/01/2017 1041   GFRAA 121 07/22/2020 1059   GFRAA 135 10/01/2017 1041   Lab Results  Component Value Date   HGBA1C 5.4 01/24/2024   HGBA1C 6.0 (H) 10/01/2017   Lab Results  Component Value Date   INSULIN  3.1 01/24/2024   INSULIN  20.1 12/07/2017   Lab Results  Component Value Date   TSH 15.300 (H) 01/24/2024   CBC    Component Value Date/Time   WBC 6.1 12/02/2023 1534   RBC 4.43  12/02/2023 1534   HGB 13.4 12/02/2023 1534   HGB 13.7 06/28/2023 0000   HCT 39.5 12/02/2023 1534   HCT 41.5 06/28/2023 0000   PLT 219 12/02/2023 1534   PLT 187 06/28/2023 0000   MCV 89.2 12/02/2023 1534   MCV 90 06/28/2023 0000   MCH 30.2 12/02/2023 1534   MCHC 33.9 12/02/2023 1534   RDW 11.8 12/02/2023 1534   RDW 11.7 06/28/2023 0000   Iron Studies    Component Value Date/Time   IRON 110 01/24/2024 0948   TIBC 305 01/24/2024 0948   FERRITIN 113 01/24/2024 0948    IRONPCTSAT 36 01/24/2024 0948   IRONPCTSAT 31 12/02/2023 1534   Lipid Panel     Component Value Date/Time   CHOL 146 01/24/2024 0948   TRIG 70 01/24/2024 0948   HDL 63 01/24/2024 0948   CHOLHDL 2.7 04/30/2022 1603   LDLCALC 69 01/24/2024 0948   LDLCALC 58 04/30/2022 1603   Hepatic Function Panel     Component Value Date/Time   PROT 6.7 01/24/2024 0948   ALBUMIN 4.5 01/24/2024 0948   AST 26 01/24/2024 0948   ALT 20 01/24/2024 0948   ALKPHOS 48 01/24/2024 0948   BILITOT 0.4 01/24/2024 0948      Component Value Date/Time   TSH 15.300 (H) 01/24/2024 0948   Nutritional Lab Results  Component Value Date   VD25OH 62.8 01/24/2024   VD25OH 74.9 06/28/2023   VD25OH 71.1 08/05/2021     Assessment and Plan    Hand, Foot, and Mouth Disease She likely contracted hand, foot, and mouth disease from her granddaughter, presenting with a raw sensation in her mouth, sore throat, and tongue discomfort. No blisters are present. The condition is self-limiting, typically resolving in about seven days for adults. The main concern is maintaining hydration to prevent complications. - Contact primary care provider for further evaluation - Continue ibuprofen and acetaminophen for symptom relief - Use chloraseptic sprays for sore throat - Encourage fluid intake, possibly using a straw to ease discomfort - Consume soft foods and consider protein shakes - Avoid sharp foods and opt for cold foods to reduce inflammation  Obesity She reports a setback in weight management, feeling she has gained weight since her last visit. She is concerned about regaining weight post-weight loss surgery. Current weight is between 160-165 pounds. She acknowledges poor dietary choices and lack of exercise, influenced by stress and lifestyle factors, such as caring for her granddaughter. Emphasized the importance of protein intake and strengthening exercises to boost metabolism and prevent weight regain. Protein intake  should be a minimum of 90 grams daily, with 120 grams being optimal. Real food protein is preferred over supplements as it burns 25% of the calories consumed, unlike supplements which burn zero. Highlighted the importance of avoiding grazing and focusing on structured meals to prevent overeating. - Consume a minimum of 90 grams of protein daily, with 120 grams being optimal - Prioritize real food protein over supplements to maximize metabolic benefits - Reintroduce strengthening exercises once feeling better - Avoid grazing and focus on structured meals - Monitor thyroid  levels during next office visit to assess for potential adjustments  Hypothyroidism She mentions previous low thyroid  function tests and a recent increase in thyroid  medication dosage. Suggested checking thyroid  levels during the next office visit to ensure appropriate dosing, as thyroid  function can impact weight management and overall health. - Check thyroid  levels during next office visit       She was informed of the  importance of frequent follow up visits to maximize her success with intensive lifestyle modifications for her multiple health conditions.    Louann Penton, MD

## 2024-03-31 ENCOUNTER — Ambulatory Visit (INDEPENDENT_AMBULATORY_CARE_PROVIDER_SITE_OTHER): Admitting: Nurse Practitioner

## 2024-03-31 ENCOUNTER — Encounter: Payer: Self-pay | Admitting: Nurse Practitioner

## 2024-03-31 VITALS — BP 122/86 | HR 92 | Temp 98.6°F | Resp 16 | Ht 61.0 in | Wt 165.0 lb

## 2024-03-31 DIAGNOSIS — B084 Enteroviral vesicular stomatitis with exanthem: Secondary | ICD-10-CM

## 2024-03-31 DIAGNOSIS — E890 Postprocedural hypothyroidism: Secondary | ICD-10-CM | POA: Diagnosis not present

## 2024-03-31 MED ORDER — TRIAMCINOLONE ACETONIDE 0.1 % EX OINT: 1.0000 | TOPICAL_OINTMENT | Freq: Two times a day (BID) | CUTANEOUS | 0 refills | Status: DC

## 2024-03-31 NOTE — Progress Notes (Signed)
 BP 122/86   Pulse 92   Temp 98.6 F (37 C)   Resp 16   Ht 5' 1 (1.549 m)   Wt 165 lb (74.8 kg)   SpO2 100%   BMI 31.18 kg/m    Subjective:    Patient ID: [name], female    DOB: [birth year], 48 y.o.   MRN: [id number]  HPI: [name] is a 48 y.o. female  Chief Complaint  Patient presents with   Sore Throat    Throat, mouth and roof of mouth irritation   Rash    Widespread, grand daughter has hand foot mouth    Discussed the use of AI scribe software for clinical note transcription with the patient, who gave verbal consent to proceed.  History of Present Illness [name] is a 48 year old female who presents with a rash and itching after exposure to her granddaughter with hand foot and mouth.   Pruritic rash - Onset after contact with teething granddaughter who had a transient rash - Rash first appeared on lower back and buttocks, then spread to shoulder, anterior trunk, and legs - Rash onset was the day after exposure, while on vacation - Initial appearance was small, itchy lesions, initially thought to be mosquito bites - Severe pruritus associated with the rash - Rash has persisted for several days, causing significant discomfort - Granddaughter's rash resolved within 24 hours, but patient's rash has not resolved - Benadryl cream used for symptomatic relief  Oral mucosal discomfort - Sensation of rawness in the mouth since Saturday, described as a burning feeling - No visible blisters in the mouth - No difficulty eating or drinking - No trouble swallowing  Systemic symptoms - No fever  Infectious exposure concerns - Concern about the possibility of reinfecting her granddaughter         12/02/2023    3:23 PM 06/28/2023    2:10 PM 12/01/2022    1:50 PM  Depression screen PHQ 2/9  Decreased Interest 0 0 0  Down, Depressed, Hopeless 0 0 0  PHQ - 2 Score 0 0 0  Altered sleeping 0    Tired, decreased energy 0    Change in appetite  0    Feeling bad or failure about yourself  0    Trouble concentrating 0    Moving slowly or fidgety/restless 0    Suicidal thoughts 0    PHQ-9 Score 0    Difficult doing work/chores Not difficult at all      Relevant past medical, surgical, family and social history reviewed and updated as indicated. Interim medical history since our last visit reviewed. Allergies and medications reviewed and updated.  Review of Systems  Ten systems reviewed and is negative except as mentioned in HPI      Objective:     BP 122/86   Pulse 92   Temp 98.6 F (37 C)   Resp 16   Ht 5' 1 (1.549 m)   Wt 165 lb (74.8 kg)   SpO2 100%   BMI 31.18 kg/m    Wt Readings from Last 3 Encounters:  03/31/24 165 lb (74.8 kg)  12/02/23 163 lb 8 oz (74.2 kg)  10/04/23 161 lb 3.2 oz (73.1 kg)    Physical Exam Physical Exam GENERAL: Alert, cooperative, well developed, no acute distress HEENT: Normocephalic, normal oropharynx, moist mucous membranes CHEST: Clear to auscultation bilaterally, no wheezes, rhonchi, or crackles CARDIOVASCULAR: Normal heart rate and rhythm, S1 and  S2 normal without murmurs ABDOMEN: Soft, non-tender, non-distended, without organomegaly, normal bowel sounds EXTREMITIES: No cyanosis or edema NEUROLOGICAL: Cranial nerves grossly intact, moves all extremities without gross motor or sensory deficit SKIN: Rash present on shoulder, anterior torso, abdomen, and legs   Results for orders placed or performed in visit on 12/23/23  TSH   Collection Time: 01/24/24  9:48 AM  Result Value Ref Range   TSH 15.300 (H) 0.450 - 4.500 uIU/mL  VITAMIN D  25 Hydroxy (Vit-D Deficiency, Fractures)   Collection Time: 01/24/24  9:48 AM  Result Value Ref Range   Vit D, 25-Hydroxy 62.8 30.0 - 100.0 ng/mL  Lipid Panel With LDL/HDL Ratio   Collection Time: 01/24/24  9:48 AM  Result Value Ref Range   Cholesterol, Total 146 100 - 199 mg/dL   Triglycerides 70 0 - 149 mg/dL   HDL 63 >60 mg/dL   VLDL  Cholesterol Cal 14 5 - 40 mg/dL   LDL Chol Calc (NIH) 69 0 - 99 mg/dL   LDL/HDL Ratio 1.1 0.0 - 3.2 ratio  Insulin , random   Collection Time: 01/24/24  9:48 AM  Result Value Ref Range   INSULIN  3.1 2.6 - 24.9 uIU/mL  Hemoglobin A1c   Collection Time: 01/24/24  9:48 AM  Result Value Ref Range   Hgb A1c MFr Bld 5.4 4.8 - 5.6 %   Est. average glucose Bld gHb Est-mCnc 108 mg/dL  RFE85+ZHQM   Collection Time: 01/24/24  9:48 AM  Result Value Ref Range   Glucose 79 70 - 99 mg/dL   BUN 12 6 - 24 mg/dL   Creatinine, Ser 9.38 0.57 - 1.00 mg/dL   eGFR 889 >40 fO/fpw/8.26   BUN/Creatinine Ratio 20 9 - 23   Sodium 141 134 - 144 mmol/L   Potassium 3.8 3.5 - 5.2 mmol/L   Chloride 102 96 - 106 mmol/L   CO2 25 20 - 29 mmol/L   Calcium 8.8 8.7 - 10.2 mg/dL   Total Protein 6.7 6.0 - 8.5 g/dL   Albumin 4.5 3.9 - 4.9 g/dL   Globulin, Total 2.2 1.5 - 4.5 g/dL   Bilirubin Total 0.4 0.0 - 1.2 mg/dL   Alkaline Phosphatase 48 44 - 121 IU/L   AST 26 0 - 40 IU/L   ALT 20 0 - 32 IU/L  Vitamin B12   Collection Time: 01/24/24  9:48 AM  Result Value Ref Range   Vitamin B-12 1,336 (H) 232 - 1,245 pg/mL  Folate   Collection Time: 01/24/24  9:48 AM  Result Value Ref Range   Folate >20.0 >3.0 ng/mL  Iron and TIBC   Collection Time: 01/24/24  9:48 AM  Result Value Ref Range   Total Iron Binding Capacity 305 250 - 450 ug/dL   UIBC 804 868 - 574 ug/dL   Iron 889 27 - 840 ug/dL   Iron Saturation 36 15 - 55 %  Ferritin   Collection Time: 01/24/24  9:48 AM  Result Value Ref Range   Ferritin 113 15 - 150 ng/mL          Assessment & Plan:   Problem List Items Addressed This Visit       Endocrine   Postoperative hypothyroidism   Relevant Orders   TSH   Other Visit Diagnoses       Hand, foot and mouth disease    -  Primary   Relevant Medications   triamcinolone  ointment (KENALOG ) 0.1 %        Assessment and  Plan Assessment & Plan Viral exanthem (likely hand, foot, and mouth  disease) Presents with an itchy rash on the shoulder, lower back, and abdomen after contact with her symptomatic granddaughter. No fever or difficulty swallowing, but a raw sensation in the mouth is present. Condition is self-limiting and expected to resolve spontaneously. - Prescribe steroid cream for itching. Kenalog  cream - Advise use of Benadryl cream as needed. - Reassure that the condition should resolve spontaneously. - Advise to maintain hydration. - Inform that feeling unwell without specific symptoms is normal and fever may occur.  Thyroid  level follow-up Due for thyroid  level re-evaluation. Last checked in May. - Schedule thyroid  level check next week.        Follow up plan: Return if symptoms worsen or fail to improve.

## 2024-04-03 ENCOUNTER — Ambulatory Visit: Admitting: Nurse Practitioner

## 2024-04-18 DIAGNOSIS — Z419 Encounter for procedure for purposes other than remedying health state, unspecified: Secondary | ICD-10-CM | POA: Diagnosis not present

## 2024-04-20 ENCOUNTER — Encounter (INDEPENDENT_AMBULATORY_CARE_PROVIDER_SITE_OTHER): Payer: Self-pay | Admitting: Family Medicine

## 2024-04-20 ENCOUNTER — Ambulatory Visit (INDEPENDENT_AMBULATORY_CARE_PROVIDER_SITE_OTHER): Admitting: Family Medicine

## 2024-04-20 VITALS — BP 97/63 | HR 72 | Temp 98.1°F | Ht 61.0 in | Wt 161.0 lb

## 2024-04-20 DIAGNOSIS — E669 Obesity, unspecified: Secondary | ICD-10-CM | POA: Diagnosis not present

## 2024-04-20 DIAGNOSIS — G4733 Obstructive sleep apnea (adult) (pediatric): Secondary | ICD-10-CM | POA: Diagnosis not present

## 2024-04-20 DIAGNOSIS — E66811 Obesity, class 1: Secondary | ICD-10-CM | POA: Diagnosis not present

## 2024-04-20 DIAGNOSIS — Z9884 Bariatric surgery status: Secondary | ICD-10-CM | POA: Diagnosis not present

## 2024-04-20 DIAGNOSIS — Z6830 Body mass index (BMI) 30.0-30.9, adult: Secondary | ICD-10-CM | POA: Diagnosis not present

## 2024-04-20 NOTE — Progress Notes (Signed)
 Office: [phone]  /  Fax: [phone]  WEIGHT SUMMARY AND BIOMETRICS  Anthropometric Measurements Height: 5' 1 (1.549 m) Weight: 161 lb (73 kg) BMI (Calculated): 30.44 Weight at Last Visit: 153 lb Weight Lost Since Last Visit: 0 Weight Gained Since Last Visit: 8 lb Starting Weight: 292 lb Total Weight Loss (lbs): 131 lb (59.4 kg) Peak Weight: 303 lb   Body Composition  Body Fat %: 41.9 % Fat Mass (lbs): 67.4 lbs Muscle Mass (lbs): 88.8 lbs Total Body Water (lbs): 71.6 lbs Visceral Fat Rating : 9   Other Clinical Data Fasting: no Labs: no Today's Visit #: 76 Starting Date: 12/07/17    Chief Complaint: OBESITY    History of Present Illness [name] is a 48 year old female with obesity status post bariatric surgery who presents for obesity treatment and progress assessment.  She has been adhering to a category one eating plan, journaling her intake with a goal of 1200 calories and 80-90 grams of protein daily. Despite these efforts, she has gained eight pounds since her last visit eight months ago, although she remains over 100 pounds down from her starting weight post-bariatric surgery in 2024.  She faces challenges with meal planning and hunger management, often skipping meals, grazing, and not engaging in exercise. She finds it easy to grab snacks like chips throughout the day, leading to feelings of guilt reminiscent of when she was heavier three to four years ago.  She recently had a virtual visit with her bariatric team for her three-year postoperative check-up, during which labs were requested. She plans to have these labs done locally and is interested in checking her thyroid  function as it hasn't been done recently.  Her current lifestyle includes living with her granddaughter and daughter, who is getting married soon, adding to her stress levels. Her granddaughter enjoys outdoor activities, which may encourage more physical activity as  the weather improves. She reports that she is working on improving her exercise routine and meal preparation habits, such as washing and cutting produce immediately after grocery shopping to make healthier choices more convenient.  She tends to eat quickly, leading to overeating, and uses toddler plates and utensils to help control portion sizes. She is considering strategies to slow down her eating pace and shares a protein pudding recipe she enjoys, which helps manage her protein intake.      PHYSICAL EXAM:  Blood pressure 97/63, pulse 72, temperature 98.1 F (36.7 C), height 5' 1 (1.549 m), weight 161 lb (73 kg), SpO2 97%. Body mass index is 30.42 kg/m.  DIAGNOSTIC DATA REVIEWED:  BMET    Component Value Date/Time   NA 141 01/24/2024 0948   K 3.8 01/24/2024 0948   CL 102 01/24/2024 0948   CO2 25 01/24/2024 0948   GLUCOSE 79 01/24/2024 0948   GLUCOSE 87 04/30/2022 1603   BUN 12 01/24/2024 0948   CREATININE 0.61 01/24/2024 0948   CREATININE 0.52 04/30/2022 1603   CALCIUM 8.8 01/24/2024 0948   GFRNONAA 105 07/22/2020 1059   GFRNONAA 116 10/01/2017 1041   GFRAA 121 07/22/2020 1059   GFRAA 135 10/01/2017 1041   Lab Results  Component Value Date   HGBA1C 5.4 01/24/2024   HGBA1C 6.0 (H) 10/01/2017   Lab Results  Component Value Date   INSULIN  3.1 01/24/2024   INSULIN  20.1 12/07/2017   Lab Results  Component Value Date   TSH 15.300 (H) 01/24/2024   CBC    Component Value Date/Time  WBC 6.1 12/02/2023 1534   RBC 4.43 12/02/2023 1534   HGB 13.4 12/02/2023 1534   HGB 13.7 06/28/2023 0000   HCT 39.5 12/02/2023 1534   HCT 41.5 06/28/2023 0000   PLT 219 12/02/2023 1534   PLT 187 06/28/2023 0000   MCV 89.2 12/02/2023 1534   MCV 90 06/28/2023 0000   MCH 30.2 12/02/2023 1534   MCHC 33.9 12/02/2023 1534   RDW 11.8 12/02/2023 1534   RDW 11.7 06/28/2023 0000   Iron Studies    Component Value Date/Time   IRON 110 01/24/2024 0948   TIBC 305 01/24/2024 0948    FERRITIN 113 01/24/2024 0948   IRONPCTSAT 36 01/24/2024 0948   IRONPCTSAT 31 12/02/2023 1534   Lipid Panel     Component Value Date/Time   CHOL 146 01/24/2024 0948   TRIG 70 01/24/2024 0948   HDL 63 01/24/2024 0948   CHOLHDL 2.7 04/30/2022 1603   LDLCALC 69 01/24/2024 0948   LDLCALC 58 04/30/2022 1603   Hepatic Function Panel     Component Value Date/Time   PROT 6.7 01/24/2024 0948   ALBUMIN 4.5 01/24/2024 0948   AST 26 01/24/2024 0948   ALT 20 01/24/2024 0948   ALKPHOS 48 01/24/2024 0948   BILITOT 0.4 01/24/2024 0948      Component Value Date/Time   TSH 15.300 (H) 01/24/2024 0948   Nutritional Lab Results  Component Value Date   VD25OH 62.8 01/24/2024   VD25OH 74.9 06/28/2023   VD25OH 71.1 08/05/2021     Assessment and Plan Assessment & Plan Obesity, status post bariatric surgery Obesity in remission with recent weight gain of 8 pounds over 8 months post-bariatric surgery in 2024. Challenges include meal planning, skipping meals, grazing, and lack of exercise. Emphasized the importance of structured meal planning and regular exercise to prevent relapse and maintain weight loss. Discussed strategies for meal preparation, portion control, and focusing on protein and vegetables during meals. - Encourage structured meal planning and portion control. - Incorporate regular exercise into daily routine. - Use smaller plates and utensils to aid portion control. - Focus on protein and vegetables first during meals.  Screening and management of vitamin and micronutrient deficiencies after bariatric surgery Post-bariatric surgery requires regular monitoring of vitamin and micronutrient levels. Labs requested by bariatric team include CBC, CMP, zinc , copper, iron panel, TSH, folate, vitamin A , B12, B1, B6, D, E, and PTH. Discussed the importance of monitoring these levels to prevent deficiencies. - Order CBC, CMP, zinc , copper, iron panel, TSH, folate, vitamin A , B12, B1, B6, D,  E, and PTH labs.       She was informed of the importance of frequent follow up visits to maximize her success with intensive lifestyle modifications for her multiple health conditions.    Louann Penton, MD

## 2024-04-25 ENCOUNTER — Other Ambulatory Visit: Payer: Self-pay | Admitting: Nurse Practitioner

## 2024-04-25 DIAGNOSIS — F321 Major depressive disorder, single episode, moderate: Secondary | ICD-10-CM

## 2024-04-25 DIAGNOSIS — J309 Allergic rhinitis, unspecified: Secondary | ICD-10-CM

## 2024-04-25 DIAGNOSIS — E890 Postprocedural hypothyroidism: Secondary | ICD-10-CM

## 2024-04-27 NOTE — Telephone Encounter (Signed)
 Requested Prescriptions  Pending Prescriptions Disp Refills   ARMOUR THYROID  120 MG tablet [Pharmacy Med Name: ARMOUR THYROID  120MG  TABLET] 90 tablet 0    Sig: TAKE ONE TABLET (120 MG TOTAL) BY MOUTH DAILY BEFORE BREAKFAST.     Endocrinology:  Hypothyroid Agents Passed - 04/27/2024 12:15 PM      Passed - TSH in normal range and within 360 days    TSH  Date Value Ref Range Status  04/24/2024 1.080 0.450 - 4.500 uIU/mL Final         Passed - Valid encounter within last 12 months    Recent Outpatient Visits           3 weeks ago Hand, foot and mouth disease   [facility] Gareth Mliss F[place], FNP   4 months ago Restless leg syndrome   [facility] Gareth Mliss FAL[place], [place]

## 2024-04-27 NOTE — Telephone Encounter (Signed)
 Requested Prescriptions  Pending Prescriptions Disp Refills   DULoxetine  (CYMBALTA ) 60 MG capsule [Pharmacy Med Name: DULOXETINE  HYDROCHLORIDE DR 60MG  DR CAPSULE DR PART] 90 capsule 1    Sig: TAKE ONE CAPSULE BY MOUTH DAILY     Psychiatry: Antidepressants - SNRI - duloxetine  Passed - 04/27/2024 12:04 PM      Passed - Cr in normal range and within 360 days    Creat  Date Value Ref Range Status  04/30/2022 0.52 0.50 - 0.99 mg/dL Final   Creatinine, Ser  Date Value Ref Range Status  04/24/2024 0.65 0.57 - 1.00 mg/dL Final         Passed - eGFR is 30 or above and within 360 days    GFR, Est African American  Date Value Ref Range Status  10/01/2017 135 > OR = 60 mL/min/1.41m2 Final   GFR calc Af Amer  Date Value Ref Range Status  07/22/2020 121 >59 mL/min/1.73 Final    Comment:    **In accordance with recommendations from the NKF-ASN Task force,**   [facility] is in the process of updating its eGFR calculation to the   2021 CKD-EPI creatinine equation that estimates kidney function   without a race variable.    GFR, Est Non African American  Date Value Ref Range Status  10/01/2017 116 > OR = 60 mL/min/1.45m2 Final   GFR calc non Af Amer  Date Value Ref Range Status  07/22/2020 105 >59 mL/min/1.73 Final   eGFR  Date Value Ref Range Status  04/24/2024 108 >59 mL/min/1.73 Final         Passed - Completed PHQ-2 or PHQ-9 in the last 360 days      Passed - Last BP in normal range    BP Readings from Last 1 Encounters:  04/20/24 97/63         Passed - Valid encounter within last 6 months    Recent Outpatient Visits           3 weeks ago Hand, foot and mouth disease   [facility] Gareth Mliss F[place], FNP   4 months ago Restless leg syndrome   [facility] Gareth Mliss F, FNP               montelukast  (SINGULAIR ) 5 MG chewable tablet [Pharmacy Med Name: MONTELUKAST  SODIUM 5MG  TABLET CHEWABLE] 180 tablet 1    Sig: CHEW  TWO TABLETS BY MOUTH AT BEDTIME     Pulmonology:  Leukotriene Inhibitors Passed - 04/27/2024 12:04 PM      Passed - Valid encounter within last 12 months    Recent Outpatient Visits           3 weeks ago Hand, foot and mouth disease   [facility] Gareth Mliss FALCON, FNP   4 months ago Restless leg syndrome   [facility] Gareth Mliss [place], [place]

## 2024-04-28 LAB — PARATHYROID HORMONE, INTACT (NO CA): PTH: 20 pg/mL (ref 15–65)

## 2024-04-28 LAB — LIPID PANEL WITH LDL/HDL RATIO
Cholesterol, Total: 128 mg/dL (ref 100–199)
HDL: 57 mg/dL (ref >39)
LDL Chol Calc (NIH): 55 mg/dL (ref 0–99)
LDL/HDL Ratio: 1 ratio (ref 0.0–3.2)
Triglycerides: 85 mg/dL (ref 0–149)
VLDL Cholesterol Cal: 16 mg/dL (ref 5–40)

## 2024-04-28 LAB — CMP14+EGFR
ALT: 15 IU/L (ref 0–32)
AST: 19 IU/L (ref 0–40)
Albumin: 4.3 g/dL (ref 3.9–4.9)
Alkaline Phosphatase: 51 IU/L (ref 44–121)
BUN/Creatinine Ratio: 26 — ABNORMAL HIGH (ref 9–23)
BUN: 17 mg/dL (ref 6–24)
Bilirubin Total: 0.3 mg/dL (ref 0.0–1.2)
CO2: 23 mmol/L (ref 20–29)
Calcium: 9.2 mg/dL (ref 8.7–10.2)
Chloride: 101 mmol/L (ref 96–106)
Creatinine, Ser: 0.65 mg/dL (ref 0.57–1.00)
Globulin, Total: 2 g/dL (ref 1.5–4.5)
Glucose: 91 mg/dL (ref 70–99)
Potassium: 4.2 mmol/L (ref 3.5–5.2)
Sodium: 138 mmol/L (ref 134–144)
Total Protein: 6.3 g/dL (ref 6.0–8.5)
eGFR: 108 mL/min/1.73 (ref >59)

## 2024-04-28 LAB — CBC WITH DIFFERENTIAL/PLATELET
Basophils Absolute: 0 x10E3/uL (ref 0.0–0.2)
Basos: 1 %
EOS (ABSOLUTE): 0.2 x10E3/uL (ref 0.0–0.4)
Eos: 3 %
Hematocrit: 39.4 % (ref 34.0–46.6)
Hemoglobin: 13 g/dL (ref 11.1–15.9)
Immature Grans (Abs): 0 x10E3/uL (ref 0.0–0.1)
Immature Granulocytes: 0 %
Lymphocytes Absolute: 1.6 x10E3/uL (ref 0.7–3.1)
Lymphs: 24 %
MCH: 29.8 pg (ref 26.6–33.0)
MCHC: 33 g/dL (ref 31.5–35.7)
MCV: 90 fL (ref 79–97)
Monocytes Absolute: 0.6 x10E3/uL (ref 0.1–0.9)
Monocytes: 8 %
Neutrophils Absolute: 4.5 x10E3/uL (ref 1.4–7.0)
Neutrophils: 64 %
Platelets: 192 x10E3/uL (ref 150–450)
RBC: 4.36 x10E6/uL (ref 3.77–5.28)
RDW: 11.8 % (ref 11.7–15.4)
WBC: 6.9 x10E3/uL (ref 3.4–10.8)

## 2024-04-28 LAB — MAGNESIUM: Magnesium: 1.9 mg/dL (ref 1.6–2.3)

## 2024-04-28 LAB — VITAMIN B1: Thiamine: 175.9 nmol/L (ref 66.5–200.0)

## 2024-04-28 LAB — TSH: TSH: 1.08 u[IU]/mL (ref 0.450–4.500)

## 2024-04-28 LAB — FOLATE: Folate: 16.1 ng/mL (ref >3.0)

## 2024-04-28 LAB — IRON AND TIBC
Iron Saturation: 31 % (ref 15–55)
Iron: 89 ug/dL (ref 27–159)
Total Iron Binding Capacity: 287 ug/dL (ref 250–450)
UIBC: 198 ug/dL (ref 131–425)

## 2024-04-28 LAB — HEMOGLOBIN A1C
Est. average glucose Bld gHb Est-mCnc: 111 mg/dL
Hgb A1c MFr Bld: 5.5 % (ref 4.8–5.6)

## 2024-04-28 LAB — ZINC: Zinc: 54 ug/dL (ref 44–115)

## 2024-04-28 LAB — VITAMIN E
Vitamin E (Alpha Tocopherol): 10.5 mg/L (ref 7.0–25.1)
Vitamin E(Gamma Tocopherol): 0.5 mg/L (ref 0.5–5.5)

## 2024-04-28 LAB — VITAMIN B12: Vitamin B-12: 1195 pg/mL (ref 232–1245)

## 2024-04-28 LAB — INSULIN, RANDOM: INSULIN: 7.4 u[IU]/mL (ref 2.6–24.9)

## 2024-04-28 LAB — FERRITIN: Ferritin: 103 ng/mL (ref 15–150)

## 2024-04-28 LAB — VITAMIN A: Vitamin A: 35 ug/dL (ref 20.1–62.0)

## 2024-04-28 LAB — VITAMIN D 25 HYDROXY (VIT D DEFICIENCY, FRACTURES): Vit D, 25-Hydroxy: 58.3 ng/mL (ref 30.0–100.0)

## 2024-04-28 LAB — VITAMIN B6: Vitamin B6: 38.7 ug/L (ref 3.4–65.2)

## 2024-05-01 ENCOUNTER — Other Ambulatory Visit: Payer: Self-pay | Admitting: Nurse Practitioner

## 2024-05-01 DIAGNOSIS — E890 Postprocedural hypothyroidism: Secondary | ICD-10-CM

## 2024-05-17 ENCOUNTER — Telehealth: Payer: Self-pay

## 2024-05-17 ENCOUNTER — Other Ambulatory Visit: Payer: Self-pay | Admitting: Nurse Practitioner

## 2024-05-17 DIAGNOSIS — B084 Enteroviral vesicular stomatitis with exanthem: Secondary | ICD-10-CM

## 2024-05-17 MED ORDER — TRIAMCINOLONE ACETONIDE 0.1 % EX OINT: 1.0000 | TOPICAL_OINTMENT | Freq: Two times a day (BID) | CUTANEOUS | 0 refills | Status: DC

## 2024-05-17 NOTE — Telephone Encounter (Signed)
 Copied from CRM [id number]. Topic: Clinical - Medication Question >> May 17, 2024  2:51 PM [name] wrote: Reason for CRM: Pt called reporting that it will be cheaper for her to receive the 1 pound jar of Kenalog  ointment instead of tubes because she does not currently have insurance.   [facility] - [place], [place] - [address] [place] [address] Phone: [phone] Fax: [phone]  Also wants to know if her current medication list needs to be adjusted following recent labs/imaging. Please advise.   Says she feels really good with what she is currently taking.

## 2024-05-18 NOTE — Telephone Encounter (Signed)
Called lft vm

## 2024-05-18 NOTE — Telephone Encounter (Signed)
 I dont see anything about imaging?  Can you give 1pd jar of kenalog  ointment?

## 2024-05-19 NOTE — Telephone Encounter (Signed)
 Yes still on 120 and 15

## 2024-07-17 ENCOUNTER — Ambulatory Visit (INDEPENDENT_AMBULATORY_CARE_PROVIDER_SITE_OTHER): Payer: Self-pay | Admitting: Family Medicine

## 2024-08-05 ENCOUNTER — Other Ambulatory Visit: Payer: Self-pay | Admitting: Nurse Practitioner

## 2024-08-05 DIAGNOSIS — J309 Allergic rhinitis, unspecified: Secondary | ICD-10-CM

## 2024-08-05 DIAGNOSIS — F321 Major depressive disorder, single episode, moderate: Secondary | ICD-10-CM

## 2024-08-05 DIAGNOSIS — E890 Postprocedural hypothyroidism: Secondary | ICD-10-CM

## 2024-08-06 ENCOUNTER — Encounter: Payer: Self-pay | Admitting: Nurse Practitioner

## 2024-08-06 DIAGNOSIS — E890 Postprocedural hypothyroidism: Secondary | ICD-10-CM

## 2024-08-06 DIAGNOSIS — F321 Major depressive disorder, single episode, moderate: Secondary | ICD-10-CM

## 2024-08-07 MED ORDER — DULOXETINE HCL 60 MG PO CPEP: 60.0000 mg | ORAL_CAPSULE | Freq: Every day | ORAL | 0 refills | Status: AC

## 2024-08-07 MED ORDER — THYROID 120 MG PO TABS: 120.0000 mg | ORAL_TABLET | Freq: Every day | ORAL | 0 refills | Status: AC

## 2024-08-07 MED ORDER — THYROID 15 MG PO TABS: 15.0000 mg | ORAL_TABLET | Freq: Every day | ORAL | 0 refills | Status: AC

## 2024-08-09 NOTE — Telephone Encounter (Signed)
 Requested Prescriptions  Pending Prescriptions Disp Refills   montelukast  (SINGULAIR ) 5 MG chewable tablet [Pharmacy Med Name: MONTELUKAST  SODIUM 5MG  TABLET CHEWABLE] 180 tablet 0    Sig: CHEW TWO TABLETS BY MOUTH AT BEDTIME     Pulmonology:  Leukotriene Inhibitors Passed - 08/09/2024 11:55 AM      Passed - Valid encounter within last 12 months    Recent Outpatient Visits           4 months ago Hand, foot and mouth disease   [facility] Gareth Mliss FALCON, FNP   8 months ago Restless leg syndrome   [facility] Gareth Mliss [place], [place]              Refused Prescriptions Disp Refills   ARMOUR THYROID  120 MG tablet [Pharmacy Med Name: ARMOUR THYROID  120MG  TABLET] 30 tablet 0    Sig: TAKE 1 TABLET (120 MG TOTAL) BY MOUTH DAILY BEFORE BREAKFAST.     Endocrinology:  Hypothyroid Agents Passed - 08/09/2024 11:55 AM      Passed - TSH in normal range and within 360 days    TSH  Date Value Ref Range Status  04/24/2024 1.080 0.450 - 4.500 uIU/mL Final         Passed - Valid encounter within last 12 months    Recent Outpatient Visits           4 months ago Hand, foot and mouth disease   [facility] Gareth Mliss FALCON, FNP   8 months ago Restless leg syndrome   [facility] Gareth Mliss FALCON, FNP               ARMOUR THYROID  15 MG tablet [Pharmacy Med Name: ARMOUR THYROID  15MG  TABLET] 30 tablet 0    Sig: TAKE 1 TABLET (15 MG TOTAL) BY MOUTH DAILY.     Endocrinology:  Hypothyroid Agents Passed - 08/09/2024 11:55 AM      Passed - TSH in normal range and within 360 days    TSH  Date Value Ref Range Status  04/24/2024 1.080 0.450 - 4.500 uIU/mL Final         Passed - Valid encounter within last 12 months    Recent Outpatient Visits           4 months ago Hand, foot and mouth disease   [facility] Gareth Mliss FALCON, FNP   8 months ago Restless leg  syndrome   [facility] Gareth Mliss F, FNP               DULoxetine  (CYMBALTA ) 60 MG capsule [Pharmacy Med Name: DULOXETINE  HYDROCHLORIDE DR 60MG  DR CAPSULE DR PART] 90 capsule 0    Sig: TAKE ONE CAPSULE BY MOUTH DAILY     Psychiatry: Antidepressants - SNRI - duloxetine  Failed - 08/09/2024 11:55 AM      Failed - Valid encounter within last 6 months    Recent Outpatient Visits           4 months ago Hand, foot and mouth disease   [facility] Gareth Mliss FALCON, FNP   8 months ago Restless leg syndrome   [facility] Gareth Mliss [place], [place]              Passed - Cr in normal range and within 360 days    Creat  Date Value Ref Range Status  04/30/2022 0.52 0.50 - 0.99 mg/dL Final   Creatinine, Ser  Date Value Ref Range Status  04/24/2024 0.65 0.57 - 1.00 mg/dL Final         Passed - eGFR is 30 or above and within 360 days    GFR, Est African American  Date Value Ref Range Status  10/01/2017 135 > OR = 60 mL/min/1.80m2 Final   GFR calc Af Amer  Date Value Ref Range Status  07/22/2020 121 >59 mL/min/1.73 Final    Comment:    **In accordance with recommendations from the NKF-ASN Task force,**   [facility] is in the process of updating its eGFR calculation to the   2021 CKD-EPI creatinine equation that estimates kidney function   without a race variable.    GFR, Est Non African American  Date Value Ref Range Status  10/01/2017 116 > OR = 60 mL/min/1.70m2 Final   GFR calc non Af Amer  Date Value Ref Range Status  07/22/2020 105 >59 mL/min/1.73 Final   eGFR  Date Value Ref Range Status  04/24/2024 108 >59 mL/min/1.73 Final         Passed - Completed PHQ-2 or PHQ-9 in the last 360 days      Passed - Last BP in normal range    BP Readings from Last 1 Encounters:  04/20/24 97/63

## 2024-09-25 ENCOUNTER — Ambulatory Visit (INDEPENDENT_AMBULATORY_CARE_PROVIDER_SITE_OTHER): Payer: Self-pay | Admitting: Family Medicine

## 2024-09-25 ENCOUNTER — Encounter (INDEPENDENT_AMBULATORY_CARE_PROVIDER_SITE_OTHER): Payer: Self-pay | Admitting: Family Medicine

## 2024-09-25 VITALS — BP 93/63 | HR 73 | Temp 97.8°F | Ht 61.0 in | Wt 160.0 lb

## 2024-09-25 DIAGNOSIS — E509 Vitamin A deficiency, unspecified: Secondary | ICD-10-CM

## 2024-09-25 DIAGNOSIS — Z6830 Body mass index (BMI) 30.0-30.9, adult: Secondary | ICD-10-CM

## 2024-09-25 DIAGNOSIS — R198 Other specified symptoms and signs involving the digestive system and abdomen: Secondary | ICD-10-CM | POA: Diagnosis not present

## 2024-09-25 DIAGNOSIS — Z9884 Bariatric surgery status: Secondary | ICD-10-CM

## 2024-09-25 NOTE — Progress Notes (Signed)
 "  Office: [phone]  /  Fax: [phone]  WEIGHT SUMMARY AND BIOMETRICS  Anthropometric Measurements Height: 5' 1 (1.549 m) Weight: 160 lb (72.6 kg) BMI (Calculated): 30.25 Weight at Last Visit: 161 lb Weight Lost Since Last Visit: 1 lb Weight Gained Since Last Visit: 0 Starting Weight: 292 lb Total Weight Loss (lbs): 132 lb (59.9 kg) Peak Weight: 303 lb   Body Composition  Body Fat %: 43.2 % Fat Mass (lbs): 69.2 lbs Muscle Mass (lbs): 86.6 lbs Total Body Water (lbs): 71.4 lbs Visceral Fat Rating : 10   Other Clinical Data Fasting: no Labs: no Today's Visit #: 68 Starting Date: 12/07/17    Chief Complaint: OBESITY    History of Present Illness [name] is a 49 year old female who presents for a follow-up on her obesity treatment plan and progress assessment.  She has been following a prescribed diet plan of 1200 calories and 90 or more grams of protein daily, with a compliance rate of approximately 90%. She has lost one pound since her last visit five months ago. Her total weight loss since her gastric bypass surgery on May 01, 2021, is 132 pounds. She is not currently engaging in any exercise.  A significant life event occurred when her husband was hospitalized in November with sepsis and endocarditis, requiring open heart surgery and revealing cirrhosis from undiagnosed diabetes. This situation disrupted her routine, as she spent considerable time in the hospital, affecting her eating habits. Despite these challenges, she managed to maintain her weight loss by choosing healthier food options like [employer] milk and protein bars.  Post-bariatric surgery, she experiences severe pain if she eats too quickly or does not chew her food well, particularly with fibrous meats like steak. The pain is described as 'horrific,' similar to gallstone pain, and occurs within the first few bites, forcing her to stop eating immediately. This issue began about a  year after her surgery, and she has learned to manage it by eating slowly and chewing thoroughly.  She takes a daily vitamin A  supplement due to a previous deficiency, along with calcium and bariatric vitamins, which she takes consistently. Regular blood work is conducted to monitor her nutritional status, and no other vitamin deficiencies have been noted.      PHYSICAL EXAM:  Blood pressure 93/63, pulse 73, temperature 97.8 F (36.6 C), height 5' 1 (1.549 m), weight 160 lb (72.6 kg), SpO2 98%. Body mass index is 30.23 kg/m.  DIAGNOSTIC DATA REVIEWED BY MYSELF TODAY:  BMET    Component Value Date/Time   NA 138 04/24/2024 1551   K 4.2 04/24/2024 1551   CL 101 04/24/2024 1551   CO2 23 04/24/2024 1551   GLUCOSE 91 04/24/2024 1551   GLUCOSE 87 04/30/2022 1603   BUN 17 04/24/2024 1551   CREATININE 0.65 04/24/2024 1551   CREATININE 0.52 04/30/2022 1603   CALCIUM 9.2 04/24/2024 1551   GFRNONAA 105 07/22/2020 1059   GFRNONAA 116 10/01/2017 1041   GFRAA 121 07/22/2020 1059   GFRAA 135 10/01/2017 1041   Lab Results  Component Value Date   HGBA1C 5.5 04/24/2024   HGBA1C 6.0 (H) 10/01/2017   Lab Results  Component Value Date   INSULIN  7.4 04/24/2024   INSULIN  20.1 12/07/2017   Lab Results  Component Value Date   TSH 1.080 04/24/2024   CBC    Component Value Date/Time   WBC 6.9 04/24/2024 1551   WBC 6.1 12/02/2023 1534   RBC 4.36 04/24/2024  1551   RBC 4.43 12/02/2023 1534   HGB 13.0 04/24/2024 1551   HCT 39.4 04/24/2024 1551   PLT 192 04/24/2024 1551   MCV 90 04/24/2024 1551   MCH 29.8 04/24/2024 1551   MCH 30.2 12/02/2023 1534   MCHC 33.0 04/24/2024 1551   MCHC 33.9 12/02/2023 1534   RDW 11.8 04/24/2024 1551   Iron Studies    Component Value Date/Time   IRON 89 04/24/2024 1551   TIBC 287 04/24/2024 1551   FERRITIN 103 04/24/2024 1551   IRONPCTSAT 31 04/24/2024 1551   IRONPCTSAT 31 12/02/2023 1534   Lipid Panel     Component Value Date/Time   CHOL 128  04/24/2024 1551   TRIG 85 04/24/2024 1551   HDL 57 04/24/2024 1551   CHOLHDL 2.7 04/30/2022 1603   LDLCALC 55 04/24/2024 1551   LDLCALC 58 04/30/2022 1603   Hepatic Function Panel     Component Value Date/Time   PROT 6.3 04/24/2024 1551   ALBUMIN 4.3 04/24/2024 1551   AST 19 04/24/2024 1551   ALT 15 04/24/2024 1551   ALKPHOS 51 04/24/2024 1551   BILITOT 0.3 04/24/2024 1551      Component Value Date/Time   TSH 1.080 04/24/2024 1551   Nutritional Lab Results  Component Value Date   VD25OH 58.3 04/24/2024   VD25OH 62.8 01/24/2024   VD25OH 74.9 06/28/2023     Assessment and Plan Assessment & Plan Morbid obesity- controlled Morbid obesity is well-controlled with a total weight loss of 132 pounds since bariatric surgery. She adheres to a 1200 calorie diet with 90 grams of protein 90% of the time. No current exercise routine, but plans to increase physical activity. No significant weight gain during recent stressors, indicating good dietary management. - Continue current dietary plan with 1200 calories and 90 grams of protein. - Encouraged initiation of regular exercise, such as walking or chair yoga, to increase physical activity. - Scheduled follow-up in 3 months to monitor progress and adjust plan as needed.  Status post bariatric surgery Status post gastric bypass surgery on August 25th, 2022. No significant complications reported. Regular blood work shows no vitamin deficiencies except for vitamin A , which is managed with supplementation. - Continue regular blood work to monitor for vitamin deficiencies. - Continue vitamin A  supplementation as prescribed.  Post-bariatric surgery gastrointestinal symptoms Intermittent severe pain after eating, likely due to rapid eating or inadequate chewing, causing esophageal spasms or gastric distension. Symptoms occur approximately one year post-surgery and are associated with fibrous meats or bread. No significant feedback from surgeons,  but advised to manage symptoms by slowing down eating and chewing thoroughly. - Advised to slow down eating and chew food thoroughly to prevent symptoms. - Consider sipping water before meals to aid digestion. - Monitor symptoms and adjust dietary habits as needed.  Vitamin A  deficiency Managed with daily supplementation. Regular blood work confirms normalization of vitamin A  levels with supplementation. - Continue daily vitamin A  supplementation.      Patients who are on anti-obesity medications are counseled on the importance of maintaining healthy lifestyle habits, including balanced nutrition, regular physical activity, and behavioral modifications,  Medication is an adjunct to, not a replacement for, lifestyle changes and that the long-term success and weight maintenance depend on continued adherence to these strategies.   Witney was informed of the importance of frequent follow up visits to maximize her success with intensive lifestyle modifications for her obesity and obesity related health conditions as recommended by USPSTF and CMS guidelines  I personally spent a total of 30 minutes in the care of the patient today including preparing to see the patient, performing a medically appropriate evaluation of current problems, documenting clinical information in the EMR, and customized nutritional counseling for their specific health and social needs.  Louann Penton, MD   "

## 2024-09-28 ENCOUNTER — Ambulatory Visit: Admitting: Nurse Practitioner

## 2024-09-28 VITALS — BP 112/78 | HR 74 | Temp 98.2°F | Ht 61.0 in | Wt 167.0 lb

## 2024-09-28 DIAGNOSIS — G2581 Restless legs syndrome: Secondary | ICD-10-CM

## 2024-09-28 DIAGNOSIS — J020 Streptococcal pharyngitis: Secondary | ICD-10-CM

## 2024-09-28 DIAGNOSIS — J029 Acute pharyngitis, unspecified: Secondary | ICD-10-CM

## 2024-09-28 DIAGNOSIS — J4521 Mild intermittent asthma with (acute) exacerbation: Secondary | ICD-10-CM

## 2024-09-28 LAB — POC COVID19/FLU A&B COMBO
Covid Antigen, POC: NEGATIVE
Influenza A Antigen, POC: NEGATIVE
Influenza B Antigen, POC: NEGATIVE

## 2024-09-28 LAB — POCT RAPID STREP A (OFFICE): Rapid Strep A Screen: POSITIVE — AB

## 2024-09-28 MED ORDER — AMOXICILLIN-POT CLAVULANATE 875-125 MG PO TABS: 1.0000 | ORAL_TABLET | Freq: Two times a day (BID) | ORAL | 0 refills | Status: AC

## 2024-09-28 MED ORDER — ALBUTEROL SULFATE HFA 108 (90 BASE) MCG/ACT IN AERS: 2.0000 | INHALATION_SPRAY | Freq: Four times a day (QID) | RESPIRATORY_TRACT | 3 refills | Status: AC | PRN

## 2024-09-28 MED ORDER — GABAPENTIN 100 MG PO CAPS: 100.0000 mg | ORAL_CAPSULE | Freq: Two times a day (BID) | ORAL | 1 refills | Status: AC

## 2024-09-28 NOTE — Progress Notes (Signed)
 "  BP 112/78   Pulse 74   Temp 98.2 F (36.8 C)   Ht 5' 1 (1.549 m)   Wt 167 lb (75.8 kg)   SpO2 99%   BMI 31.55 kg/m    Subjective:    Patient ID: [name], female    DOB: [birth year], 49 y.o.   MRN: [id number]  HPI: [name] is a 49 y.o. female  Chief Complaint  Patient presents with   Nasal Congestion    Pt c/o congestion, scratchy throat, cough x5 days.    Discussed the use of AI scribe software for clinical note transcription with the patient, who gave verbal consent to proceed.  History of Present Illness [name] is a 49 year old female who presents with cough, congestion, and scratchy throat for five days. She is accompanied by Stephania.  Upper respiratory symptoms - Cough, congestion, and scratchy throat for five days - Initial malaise over the weekend, with worsening congestion and scratchy throat starting on Monday - Congestion localized across the bridge of the nose - Sensation of post-nasal drip - No significant throat pain, only scratchy sensation - No recent flu or COVID-19 symptoms  Chest symptoms - Chest tightness when breathing deeply - Concerned about possible progression to bronchitis  Medication use - Taking Tylenol and sinus allergy medication for symptom management - History of inhaler use, but does not use it frequently and currently out of inhaler  Psychosocial stressors - Husband has been very ill since November, status post open heart surgery, staph infection, and undiagnosed diabetes - Responsible for administering husband's medications - Concerned about husband's health in relation to her current illness   Needs refills on albuterol  inhaler and gabapentin .       09/28/2024    1:31 PM 12/02/2023    3:23 PM 06/28/2023    2:10 PM  Depression screen PHQ 2/9  Decreased Interest 0 0 0  Down, Depressed, Hopeless 0 0 0  PHQ - 2 Score 0 0 0  Altered sleeping 0 0   Tired, decreased energy 0 0   Change in appetite 0  0   Feeling bad or failure about yourself  0 0   Trouble concentrating 0 0   Moving slowly or fidgety/restless 0 0   Suicidal thoughts 0 0   PHQ-9 Score 0 0    Difficult doing work/chores  Not difficult at all      Data saved with a previous flowsheet row definition    Relevant past medical, surgical, family and social history reviewed and updated as indicated. Interim medical history since our last visit reviewed. Allergies and medications reviewed and updated.  Review of Systems  Ten systems reviewed and is negative except as mentioned in HPI      Objective:      BP 112/78   Pulse 74   Temp 98.2 F (36.8 C)   Ht 5' 1 (1.549 m)   Wt 167 lb (75.8 kg)   SpO2 99%   BMI 31.55 kg/m    Wt Readings from Last 3 Encounters:  09/28/24 167 lb (75.8 kg)  09/25/24 160 lb (72.6 kg)  04/20/24 161 lb (73 kg)    Physical Exam GENERAL: Alert, cooperative, well developed, no acute distress HEENT: Normocephalic, normal oropharynx, moist mucous membranes, throat erythematous, TMs clear CHEST: Clear to auscultation bilaterally, no wheezes, rhonchi, or crackles CARDIOVASCULAR: Normal heart rate and rhythm, S1 and S2 normal without murmurs ABDOMEN: Soft, non-tender, non-distended, without  organomegaly, normal bowel sounds EXTREMITIES: No cyanosis or edema NEUROLOGICAL: Cranial nerves grossly intact, moves all extremities without gross motor or sensory deficit  Results for orders placed or performed in visit on 09/28/24  POC Covid19/Flu A&B Antigen   Collection Time: 09/28/24  1:55 PM  Result Value Ref Range   Influenza A Antigen, POC Negative Negative   Influenza B Antigen, POC Negative Negative   Covid Antigen, POC Negative Negative  POCT rapid strep A   Collection Time: 09/28/24  1:56 PM  Result Value Ref Range   Rapid Strep A Screen Positive (A) Negative          Assessment & Plan:   Problem List Items Addressed This Visit       Respiratory   Mild intermittent asthma  with acute exacerbation   Relevant Medications   albuterol  (VENTOLIN  HFA) 108 (90 Base) MCG/ACT inhaler   Other Visit Diagnoses       Sore throat    -  Primary   Relevant Medications   amoxicillin -clavulanate (AUGMENTIN ) 875-125 MG tablet   Other Relevant Orders   POC Covid19/Flu A&B Antigen (Completed)   POCT rapid strep A (Completed)     Restless leg syndrome       Relevant Medications   gabapentin  (NEURONTIN ) 100 MG capsule     Strep pharyngitis       Relevant Medications   amoxicillin -clavulanate (AUGMENTIN ) 875-125 MG tablet        Assessment and Plan Assessment & Plan Streptococcal pharyngitis Acute streptococcal pharyngitis confirmed by positive strep test. Symptoms include cough, congestion, and scratchy throat for five days. No significant throat pain, but nasal congestion and post-nasal drip present. Early rales detected on lung examination. - Prescribed Augmentin  875 mg twice daily for 10 days. - Advised to eat yogurt to prevent yeast infection. - Instructed to eat when taking antibiotics. - Recommended over-the-counter Zyrtec  or Claritin  for congestion. - Suggested Flonase  nasal spray for nasal congestion. - Advised Mucinex  1200 mg twice daily for mucus thinning. - Encouraged hydration and avoidance of dairy to prevent mucus thickening. - Advised wearing a mask and avoiding sharing drinks or toothbrushes for 24 hours to prevent transmission.  Asthma with acute exacerbation Asthma with acute exacerbation. Reports chest tightness and has used an inhaler in the past. - Refilled albuterol  inhaler prescription.  Restless legs -refill of gabapentin  sent in        Follow up plan: Return if symptoms worsen or fail to improve. "

## 2024-10-11 ENCOUNTER — Other Ambulatory Visit: Payer: Self-pay | Admitting: Nurse Practitioner

## 2024-10-11 ENCOUNTER — Encounter: Payer: Self-pay | Admitting: Nurse Practitioner

## 2024-10-11 DIAGNOSIS — T3695XA Adverse effect of unspecified systemic antibiotic, initial encounter: Secondary | ICD-10-CM

## 2024-10-11 MED ORDER — FLUCONAZOLE 150 MG PO TABS: 150.0000 mg | ORAL_TABLET | ORAL | 0 refills | Status: AC | PRN

## 2024-12-26 ENCOUNTER — Ambulatory Visit (INDEPENDENT_AMBULATORY_CARE_PROVIDER_SITE_OTHER): Admitting: Family Medicine
